# Patient Record
Sex: Female | Born: 1955 | Race: White | Hispanic: No | Marital: Married | Smoking: Former smoker
Health system: Southern US, Community
[De-identification: ages and names within clinical notes are randomized; demographics above are authoritative.]

## PROBLEM LIST (undated history)

## (undated) DIAGNOSIS — K219 Gastro-esophageal reflux disease without esophagitis: Secondary | ICD-10-CM

## (undated) DIAGNOSIS — T82110A Breakdown (mechanical) of cardiac electrode, initial encounter: Secondary | ICD-10-CM

## (undated) DIAGNOSIS — R200 Anesthesia of skin: Secondary | ICD-10-CM

## (undated) DIAGNOSIS — E785 Hyperlipidemia, unspecified: Secondary | ICD-10-CM

## (undated) DIAGNOSIS — R519 Headache, unspecified: Secondary | ICD-10-CM

## (undated) DIAGNOSIS — Z95810 Presence of automatic (implantable) cardiac defibrillator: Secondary | ICD-10-CM

## (undated) DIAGNOSIS — I428 Other cardiomyopathies: Secondary | ICD-10-CM

## (undated) DIAGNOSIS — I442 Atrioventricular block, complete: Secondary | ICD-10-CM

## (undated) DIAGNOSIS — M549 Dorsalgia, unspecified: Secondary | ICD-10-CM

## (undated) DIAGNOSIS — R51 Headache: Secondary | ICD-10-CM

## (undated) DIAGNOSIS — Z860100 Personal history of colon polyps, unspecified: Secondary | ICD-10-CM

## (undated) DIAGNOSIS — F419 Anxiety disorder, unspecified: Secondary | ICD-10-CM

## (undated) DIAGNOSIS — M2550 Pain in unspecified joint: Secondary | ICD-10-CM

## (undated) DIAGNOSIS — I10 Essential (primary) hypertension: Secondary | ICD-10-CM

## (undated) DIAGNOSIS — M2540 Effusion, unspecified joint: Secondary | ICD-10-CM

## (undated) DIAGNOSIS — C569 Malignant neoplasm of unspecified ovary: Secondary | ICD-10-CM

## (undated) DIAGNOSIS — R202 Paresthesia of skin: Secondary | ICD-10-CM

## (undated) DIAGNOSIS — M1990 Unspecified osteoarthritis, unspecified site: Secondary | ICD-10-CM

## (undated) DIAGNOSIS — M109 Gout, unspecified: Secondary | ICD-10-CM

## (undated) DIAGNOSIS — R42 Dizziness and giddiness: Secondary | ICD-10-CM

## (undated) DIAGNOSIS — I3139 Other pericardial effusion (noninflammatory): Secondary | ICD-10-CM

## (undated) DIAGNOSIS — I313 Pericardial effusion (noninflammatory): Secondary | ICD-10-CM

## (undated) DIAGNOSIS — I5022 Chronic systolic (congestive) heart failure: Secondary | ICD-10-CM

## (undated) DIAGNOSIS — Z86718 Personal history of other venous thrombosis and embolism: Secondary | ICD-10-CM

## (undated) DIAGNOSIS — E039 Hypothyroidism, unspecified: Secondary | ICD-10-CM

## (undated) DIAGNOSIS — I739 Peripheral vascular disease, unspecified: Secondary | ICD-10-CM

## (undated) HISTORY — DX: Atrioventricular block, complete: I44.2

## (undated) HISTORY — DX: Gout, unspecified: M10.9

## (undated) HISTORY — DX: Essential (primary) hypertension: I10

## (undated) HISTORY — DX: Breakdown (mechanical) of cardiac electrode, initial encounter: T82.110A

## (undated) HISTORY — PX: KNEE SURGERY: SHX244

## (undated) HISTORY — PX: INSERT / REPLACE / REMOVE PACEMAKER: SUR710

## (undated) HISTORY — DX: Other pericardial effusion (noninflammatory): I31.39

## (undated) HISTORY — PX: DG BARIUM SWALLOW (ARMC HX): HXRAD1448

## (undated) HISTORY — DX: Hyperlipidemia, unspecified: E78.5

## (undated) HISTORY — DX: Chronic systolic (congestive) heart failure: I50.22

## (undated) HISTORY — DX: Peripheral vascular disease, unspecified: I73.9

## (undated) HISTORY — DX: Other cardiomyopathies: I42.8

## (undated) HISTORY — DX: Hypothyroidism, unspecified: E03.9

## (undated) HISTORY — PX: ANGIOPLASTY / STENTING ILIAC: SUR31

## (undated) HISTORY — DX: Pericardial effusion (noninflammatory): I31.3

## (undated) HISTORY — PX: HERNIA MESH REMOVAL: SHX1745

## (undated) HISTORY — PX: DILATION AND CURETTAGE OF UTERUS: SHX78

## (undated) HISTORY — DX: Malignant neoplasm of unspecified ovary: C56.9

## (undated) HISTORY — PX: HERNIA REPAIR: SHX51

## (undated) HISTORY — PX: ABDOMINAL HYSTERECTOMY: SHX81

## (undated) HISTORY — PX: CERVICAL CERCLAGE: SHX1329

## (undated) HISTORY — PX: COLONOSCOPY: SHX174

---

## 1992-04-14 HISTORY — PX: CERVICAL CERCLAGE: SHX1329

## 1997-08-14 ENCOUNTER — Other Ambulatory Visit: Admission: RE | Admit: 1997-08-14 | Discharge: 1997-08-14 | Payer: Self-pay | Admitting: Obstetrics and Gynecology

## 1998-08-23 ENCOUNTER — Other Ambulatory Visit: Admission: RE | Admit: 1998-08-23 | Discharge: 1998-08-23 | Payer: Self-pay | Admitting: Obstetrics and Gynecology

## 1998-10-12 ENCOUNTER — Emergency Department (HOSPITAL_COMMUNITY): Admission: EM | Admit: 1998-10-12 | Discharge: 1998-10-12 | Payer: Self-pay | Admitting: Emergency Medicine

## 1998-10-12 ENCOUNTER — Encounter: Payer: Self-pay | Admitting: Emergency Medicine

## 1999-01-10 ENCOUNTER — Ambulatory Visit (HOSPITAL_COMMUNITY): Admission: RE | Admit: 1999-01-10 | Discharge: 1999-01-10 | Payer: Self-pay | Admitting: Cardiology

## 1999-01-10 ENCOUNTER — Encounter: Payer: Self-pay | Admitting: Cardiology

## 2000-09-01 ENCOUNTER — Other Ambulatory Visit: Admission: RE | Admit: 2000-09-01 | Discharge: 2000-09-01 | Payer: Self-pay | Admitting: Obstetrics and Gynecology

## 2001-11-23 ENCOUNTER — Other Ambulatory Visit: Admission: RE | Admit: 2001-11-23 | Discharge: 2001-11-23 | Payer: Self-pay | Admitting: Obstetrics and Gynecology

## 2004-03-08 ENCOUNTER — Ambulatory Visit: Payer: Self-pay

## 2004-04-18 ENCOUNTER — Ambulatory Visit: Payer: Self-pay

## 2004-05-07 ENCOUNTER — Ambulatory Visit: Payer: Self-pay

## 2004-07-05 ENCOUNTER — Ambulatory Visit: Payer: Self-pay | Admitting: Cardiology

## 2004-08-05 ENCOUNTER — Ambulatory Visit: Payer: Self-pay | Admitting: Cardiology

## 2004-09-13 ENCOUNTER — Ambulatory Visit: Payer: Self-pay | Admitting: Cardiology

## 2004-10-03 ENCOUNTER — Ambulatory Visit: Payer: Self-pay | Admitting: Cardiology

## 2004-11-01 ENCOUNTER — Ambulatory Visit: Payer: Self-pay | Admitting: Cardiology

## 2004-12-06 ENCOUNTER — Ambulatory Visit: Payer: Self-pay | Admitting: Cardiology

## 2005-01-13 ENCOUNTER — Ambulatory Visit: Payer: Self-pay | Admitting: Cardiology

## 2005-02-17 ENCOUNTER — Ambulatory Visit: Payer: Self-pay | Admitting: Cardiology

## 2005-03-17 ENCOUNTER — Ambulatory Visit: Payer: Self-pay | Admitting: Cardiology

## 2005-04-21 ENCOUNTER — Ambulatory Visit: Payer: Self-pay | Admitting: Cardiology

## 2005-06-18 ENCOUNTER — Ambulatory Visit: Payer: Self-pay | Admitting: Cardiology

## 2005-07-28 ENCOUNTER — Ambulatory Visit: Payer: Self-pay | Admitting: Cardiology

## 2005-09-01 ENCOUNTER — Ambulatory Visit: Payer: Self-pay | Admitting: Cardiology

## 2005-10-08 ENCOUNTER — Ambulatory Visit: Payer: Self-pay | Admitting: Cardiology

## 2005-11-06 ENCOUNTER — Ambulatory Visit: Payer: Self-pay | Admitting: Cardiology

## 2005-12-18 ENCOUNTER — Ambulatory Visit: Payer: Self-pay | Admitting: Cardiology

## 2006-01-19 ENCOUNTER — Ambulatory Visit: Payer: Self-pay | Admitting: Cardiology

## 2006-03-16 ENCOUNTER — Ambulatory Visit: Payer: Self-pay | Admitting: Cardiology

## 2006-04-10 ENCOUNTER — Ambulatory Visit: Payer: Self-pay | Admitting: Cardiology

## 2006-05-08 ENCOUNTER — Ambulatory Visit: Payer: Self-pay | Admitting: Cardiology

## 2006-06-05 ENCOUNTER — Ambulatory Visit: Payer: Self-pay | Admitting: Cardiology

## 2006-07-31 ENCOUNTER — Ambulatory Visit: Payer: Self-pay | Admitting: Cardiology

## 2006-08-28 ENCOUNTER — Ambulatory Visit: Payer: Self-pay | Admitting: Cardiology

## 2006-11-09 ENCOUNTER — Ambulatory Visit: Payer: Self-pay | Admitting: Cardiology

## 2006-11-18 ENCOUNTER — Ambulatory Visit: Payer: Self-pay

## 2006-12-18 ENCOUNTER — Ambulatory Visit: Payer: Self-pay | Admitting: Cardiology

## 2007-01-15 ENCOUNTER — Ambulatory Visit: Payer: Self-pay | Admitting: Cardiology

## 2007-02-12 ENCOUNTER — Ambulatory Visit: Payer: Self-pay | Admitting: Cardiology

## 2007-05-26 ENCOUNTER — Encounter: Payer: Self-pay | Admitting: Cardiology

## 2007-05-26 ENCOUNTER — Ambulatory Visit: Payer: Self-pay | Admitting: Cardiology

## 2007-08-06 ENCOUNTER — Ambulatory Visit: Payer: Self-pay | Admitting: Cardiology

## 2007-11-12 ENCOUNTER — Ambulatory Visit: Payer: Self-pay | Admitting: Cardiology

## 2008-06-21 ENCOUNTER — Ambulatory Visit: Admission: RE | Admit: 2008-06-21 | Discharge: 2008-06-21 | Payer: Self-pay | Admitting: Gynecologic Oncology

## 2008-06-23 ENCOUNTER — Ambulatory Visit: Payer: Self-pay | Admitting: Cardiovascular Disease

## 2008-06-23 ENCOUNTER — Encounter: Payer: Self-pay | Admitting: Cardiovascular Disease

## 2008-06-23 DIAGNOSIS — I429 Cardiomyopathy, unspecified: Secondary | ICD-10-CM | POA: Insufficient documentation

## 2008-06-23 DIAGNOSIS — E785 Hyperlipidemia, unspecified: Secondary | ICD-10-CM | POA: Insufficient documentation

## 2008-06-23 DIAGNOSIS — E782 Mixed hyperlipidemia: Secondary | ICD-10-CM | POA: Insufficient documentation

## 2008-06-23 DIAGNOSIS — I442 Atrioventricular block, complete: Secondary | ICD-10-CM | POA: Insufficient documentation

## 2008-07-04 ENCOUNTER — Inpatient Hospital Stay (HOSPITAL_COMMUNITY): Admission: RE | Admit: 2008-07-04 | Discharge: 2008-07-11 | Payer: Self-pay | Admitting: Obstetrics & Gynecology

## 2008-07-04 ENCOUNTER — Encounter: Payer: Self-pay | Admitting: Gynecologic Oncology

## 2008-07-04 ENCOUNTER — Ambulatory Visit: Payer: Self-pay | Admitting: Internal Medicine

## 2008-07-05 ENCOUNTER — Ambulatory Visit: Payer: Self-pay | Admitting: Oncology

## 2008-07-13 HISTORY — PX: TOTAL ABDOMINAL HYSTERECTOMY W/ BILATERAL SALPINGOOPHORECTOMY: SHX83

## 2008-08-05 ENCOUNTER — Encounter (INDEPENDENT_AMBULATORY_CARE_PROVIDER_SITE_OTHER): Payer: Self-pay | Admitting: *Deleted

## 2008-08-09 ENCOUNTER — Encounter: Payer: Self-pay | Admitting: Cardiology

## 2008-08-09 ENCOUNTER — Ambulatory Visit: Admission: RE | Admit: 2008-08-09 | Discharge: 2008-08-09 | Payer: Self-pay | Admitting: Gynecologic Oncology

## 2008-10-25 ENCOUNTER — Ambulatory Visit: Admission: RE | Admit: 2008-10-25 | Discharge: 2008-10-25 | Payer: Self-pay | Admitting: Gynecologic Oncology

## 2008-10-25 ENCOUNTER — Encounter: Payer: Self-pay | Admitting: Cardiology

## 2008-10-30 ENCOUNTER — Ambulatory Visit: Payer: Self-pay | Admitting: Cardiology

## 2008-10-30 ENCOUNTER — Ambulatory Visit: Payer: Self-pay

## 2008-10-30 ENCOUNTER — Encounter: Payer: Self-pay | Admitting: Cardiology

## 2008-10-30 DIAGNOSIS — C569 Malignant neoplasm of unspecified ovary: Secondary | ICD-10-CM | POA: Insufficient documentation

## 2009-02-28 ENCOUNTER — Telehealth: Payer: Self-pay | Admitting: Cardiology

## 2009-05-09 ENCOUNTER — Encounter: Payer: Self-pay | Admitting: Cardiology

## 2009-05-09 ENCOUNTER — Ambulatory Visit: Admission: RE | Admit: 2009-05-09 | Discharge: 2009-05-09 | Payer: Self-pay | Admitting: Gynecologic Oncology

## 2009-05-14 ENCOUNTER — Ambulatory Visit: Payer: Self-pay | Admitting: Internal Medicine

## 2009-07-30 ENCOUNTER — Ambulatory Visit: Payer: Self-pay | Admitting: Gastroenterology

## 2009-10-19 ENCOUNTER — Telehealth: Payer: Self-pay | Admitting: Cardiology

## 2009-11-12 ENCOUNTER — Encounter (INDEPENDENT_AMBULATORY_CARE_PROVIDER_SITE_OTHER): Payer: Self-pay | Admitting: *Deleted

## 2009-11-29 ENCOUNTER — Ambulatory Visit: Payer: Self-pay | Admitting: Cardiology

## 2009-11-29 DIAGNOSIS — Z950 Presence of cardiac pacemaker: Secondary | ICD-10-CM | POA: Insufficient documentation

## 2010-02-08 ENCOUNTER — Encounter (INDEPENDENT_AMBULATORY_CARE_PROVIDER_SITE_OTHER): Payer: Self-pay | Admitting: *Deleted

## 2010-03-15 ENCOUNTER — Ambulatory Visit: Payer: Self-pay | Admitting: Internal Medicine

## 2010-03-18 ENCOUNTER — Ambulatory Visit: Payer: Self-pay | Admitting: Cardiology

## 2010-03-18 ENCOUNTER — Encounter: Payer: Self-pay | Admitting: Cardiology

## 2010-03-18 DIAGNOSIS — I70219 Atherosclerosis of native arteries of extremities with intermittent claudication, unspecified extremity: Secondary | ICD-10-CM | POA: Insufficient documentation

## 2010-03-20 ENCOUNTER — Encounter: Payer: Self-pay | Admitting: Cardiology

## 2010-03-20 ENCOUNTER — Ambulatory Visit: Payer: Self-pay

## 2010-04-03 ENCOUNTER — Ambulatory Visit: Payer: Self-pay | Admitting: Cardiovascular Disease

## 2010-04-04 ENCOUNTER — Ambulatory Visit (HOSPITAL_COMMUNITY)
Admission: RE | Admit: 2010-04-04 | Discharge: 2010-04-04 | Payer: Self-pay | Source: Home / Self Care | Attending: Cardiovascular Disease | Admitting: Cardiovascular Disease

## 2010-04-05 HISTORY — PX: ILIAC ARTERY STENT: SHX1786

## 2010-04-14 HISTORY — PX: PACEMAKER INSERTION: SHX728

## 2010-05-02 ENCOUNTER — Ambulatory Visit: Admit: 2010-05-02 | Payer: Self-pay | Admitting: Cardiovascular Disease

## 2010-05-02 ENCOUNTER — Ambulatory Visit: Admit: 2010-05-02 | Payer: Self-pay

## 2010-05-12 ENCOUNTER — Encounter: Payer: Self-pay | Admitting: Internal Medicine

## 2010-05-16 NOTE — Consult Note (Signed)
Summary: [facility]   Imported By: Roderic Ovens 05/11/2009 13:55:16  _____________________________________________________________________  External Attachment:    Type:   Image     Comment:   External Document

## 2010-05-16 NOTE — Procedures (Signed)
Summary: pacer check/st.jude   Current Medications (verified): 1)  Metoprolol Tartrate 50 Mg Tabs (Metoprolol Tartrate) .... Take One and A Half  Tablets  By Mouth Twice A Day 2)  Benazepril Hcl 10 Mg Tabs (Benazepril Hcl) .... Take 1 By Mouth Once Daily 3)  Aspirin 81 Mg Tbec (Aspirin) .... Take One Tablet By Mouth Daily 4)  Levoxyl 75 Mcg Tabs (Levothyroxine Sodium) 5)  Lovastatin 20 Mg Tabs (Lovastatin) .... Take One Tablet By Mouth Daily At Bedtime  Allergies (verified): 1)  ! * Latex  PPM Specifications Following MD:  Everardo Beals. Juanda Chance, MD     PPM Vendor:  Pacesetter     PPM Model Number:  [id number]     PPM Serial Number:  [id number] DOI:  10/23/1994     PPM Implanting MD:  Everardo Beals. Juanda Chance, MD  Lead 1    Location: [facility]     DOI: 10/23/1994     Model #: [id number]     Serial #: [id number]     Status: active Lead 2    Location: RV     DOI: 10/23/1994     Model #: [id number]     Serial #: [id number]     Status: active   Indications:  Congential Complete heart block   PPM Follow Up Remote Check?  No Battery Voltage:  2.69 V     Battery Est. Longevity:  1 years     Pacer Dependent:  Yes       PPM Device Measurements Atrium  Amplitude: 1.0 mV, Impedance: 564 ohms, Threshold: 2.0 V at 0.6 msec Right Ventricle  Impedance: 830 ohms, Threshold: 1.0 V at 0.4 msec  Parameters Mode:  DDD     Lower Rate Limit:  50     Upper Rate Limit:  120 Paced AV Delay:  125     Next Cardiology Appt Due:  10/12/2009 Tech Comments:  No parameter changes.  Device function normal.  Estimated longevity 1 year.  ROV 6 months Dr. Juanda Chance. [name], LPN  May 14, 2009 9:05 AM

## 2010-05-16 NOTE — Letter (Signed)
Summary: Peripheral Vascular  [facility], Main Office  [address]   [place], [place] [address]   Phone: [phone]  Fax: [phone]     04/03/2010 MRN: [id number]  KORALEE WEDEKING [address] [place], [place]  [address]  Dear [name],   You are scheduled for Peripheral Vascular Angiogram on Thursday April 04, 2010 with Dr. Excell Seltzer.  Please arrive at the [facility] at 7:00       a.m. on the day of your procedure.  1. DIET     __X__ Nothing to eat or drink after midnight except your medications with a sip of water.  2. MAKE SURE YOU TAKE YOUR ASPIRIN.  3. _X___ YOU MAY TAKE ALL of your remaining medications with a small amount of water.  Please take Plavix 75mg  once a day starting 04/04/10.      4. Plan for one night stay - bring personal belongings (i.e. toothpaste, toothbrush, etc.)  5. Bring a current list of your medications and current insurance cards.  6. Must have a responsible person to drive you home.   7. Someone must be with you for the first 24 hours after you arrive home.  8. Please wear clothes that are easy to get on and off and wear slip-on shoes.  *Special note: Every effort is made to have your procedure done on time.  Occasionally there are emergencies that present themselves at the hospital that may cause delays.  Please be patient if a delay does occur.  If you have any questions after you get home, please call the office at the number listed above.   Julieta Gutting, RN, BSN

## 2010-05-16 NOTE — Assessment & Plan Note (Signed)
Summary: npv   Visit Type:  Initial Consult Primary Provider:  Vonita Moss  CC:  leg pain/claudication.  History of Present Illness: 55 year-old woman presenting for initial evaluation of lower extremity PAD. She describes about 6 months of left leg pain in the calf, thigh, and groin. This occurs with walking and resolves with rest. She notes a longer duration of pain with rest compared to the time of pain onset in the Summer. She cannot walk up an incline without pain in the leg. Denies right leg pain. Denies rest pain or ulceration. Notes progression of symptoms over the past few months. She previously was a regular walker - notes 6 pound weight gain since she has had to stop her walking program.  The patient has a history of cardiac conduction disease and is s/p PPM in 1996. She has no history of CAD.  Current Medications (verified): 1)  Metoprolol Tartrate 50 Mg Tabs (Metoprolol Tartrate) .... Take One and A Half  Tablets  By Mouth Twice A Day 2)  Benazepril Hcl 10 Mg Tabs (Benazepril Hcl) .... Take 1 By Mouth Once Daily 3)  Aspirin 81 Mg Tbec (Aspirin) .... Take One Tablet By Mouth Daily 4)  Synthroid 100 Mcg Tabs (Levothyroxine Sodium) 5)  Lovastatin 20 Mg Tabs (Lovastatin) .... Take One Tablet By Mouth Daily At Bedtime 6)  Centrum  Tabs (Multiple Vitamins-Minerals) .... One By Mouth Daily  Allergies (verified): 1)  ! * Latex  Past History:  Past medical, surgical, family and social histories (including risk factors) reviewed, and no changes noted (except as noted below).  Past Medical History: Reviewed history from 10/29/2008 and no changes required. 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 Pacemaker 1996 Knee surgery  Family History: Reviewed  history from 06/23/2008 and no changes required. Her father had diabetes.  He died of lung cancer.   Social History: Reviewed history from 06/23/2008 and no changes required. 1ppd for 34 years - quit March 2010 4-6 beers per night no illicit drugs Married I child  Review of Systems       Positive for numbness/tingling in her hands at night. Otherwise negative except as per HPI.  Vital Signs:  Patient profile:   55 year old female Height:      65 inches Weight:      196 pounds BMI:     32.73 Pulse rate:   72 / minute Resp:     16 per minute BP sitting:   107 / 75  (right arm)  Vitals Entered By: Marrion Coy, CNA (April 03, 2010 9:26 AM)  Serial Vital Signs/Assessments:  Time      Position  BP       Pulse  Resp  Temp     By           L Arm     119/80                         Marrion Coy, CNA   Physical Exam  General:  Pt is alert and oriented, in no acute distress. HEENT: normal Neck: normal carotid upstrokes without bruits, JVP normal Lungs: CTA CV: RRR without murmur or gallop Abd: soft, NT, positive BS, no bruit, no organomegaly Ext: no clubbing, cyanosis, or edema.  Right leg:  fem, DP pulses 2+, PT nonpalp Left leg: fem, DP, and PT nonpalp Skin: warm and dry without rash feet are warm, no ulceration    Arterial Doppler  Procedure date:  03/20/2010  Findings:      ABI 0.78 on the left and 1.0 on the right. Left external iliac peak velocity greater than 600 cm/s, right leg arteries patent.  PPM Specifications Following MD:  Everardo Beals. Juanda Chance, MD     PPM Vendor:  Pacesetter     PPM Model Number:  [phone]     PPM Serial Number:  [id number] DOI:  10/23/1994     PPM Implanting MD:  Everardo Beals. 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 Her exam and noninvasive studies are consistent with severe iliac stenosis. The duplex scan shows the stenosis at the junction of the common and external iliac with associated post-stenotic dilatation. Recommended angiography with an eye toward PTA and the patient agrees to proceed. Risks, potential benefits, and alternatives were reviewed with the patient and she expressed understanding. She will be started on plavix 300 mg today and then 75 mg daily starting tomorrow.  Orders: PV Procedure (PV Procedure) Arterial Duplex Lower Extremity (Arterial Duplex Low)  Patient Instructions: 1)  Your physician recommends that you schedule a follow-up appointment in: 2-4 WEEKS 2)  Your physician has requested that you have an ankle brachial index (ABI) in 2-4 WEEKS. During this test an ultrasound and blood pressure cuff are used to evaluate the arteries that supply the arms and legs with blood. Allow thirty minutes for this exam. There are no restrictions or special instructions. 3)  Your physician has requested that you have a peripheral vascular angiogram. This exam is performed at the hospital. During this exam IV contrast is used to look at arterial blood flow.  Please review the information sheet given for details. 4)  Your physician has recommended you make the following change in your medication: START Plavix 75mg  once a day Prescriptions: PLAVIX 75 MG TABS (CLOPIDOGREL BISULFATE) Take one tablet by mouth daily  #30 x 1   Entered by:   Julieta Gutting, RN, BSN   Authorized by:   Norva Karvonen, MD   Signed by:   Julieta Gutting, RN, BSN on 04/03/2010   Method used:    Electronically to        [id number] (retail)       [address]       [place], [place]  [address]       Ph: [phone] or [phone]  Fax: [phone]   RxID:   [id number]

## 2010-05-16 NOTE — Miscellaneous (Signed)
Summary: dx correction  Clinical Lists Changes  Problems: Changed problem from PACEMAKER (ICD-V45..01) to PACEMAKER, PERMANENT (ICD-V45.01)  changed the incorrect dx code to correct dx code 

## 2010-05-16 NOTE — Letter (Signed)
Summary: Device-Delinquent Check  [facility], Main Office  [address]   [place], [place] [address]   Phone: [phone]  Fax: [phone]     November 12, 2009 MRN: [id number]   [name] [name] [address] [place], [place]  [address]   Dear [name],  According to our records, you have not had your implanted device checked in the recommended period of time.  We are unable to determine appropriate device function without checking your device on a regular basis.  Please call our office to schedule an appointment as soon as possible.  If you are having your device checked by another physician, please call us so that we may update our records.  Thank you,   [facility]

## 2010-05-16 NOTE — Procedures (Signed)
Summary: device/saf   Current Medications (verified): 1)  Metoprolol Tartrate 50 Mg Tabs (Metoprolol Tartrate) .... Take One and A Half  Tablets  By Mouth Twice A Day 2)  Benazepril Hcl 10 Mg Tabs (Benazepril Hcl) .... Take 1 By Mouth Once Daily 3)  Aspirin 81 Mg Tbec (Aspirin) .... Take One Tablet By Mouth Daily 4)  Synthroid 100 Mcg Tabs (Levothyroxine Sodium) 5)  Lovastatin 20 Mg Tabs (Lovastatin) .... Take One Tablet By Mouth Daily At Bedtime 6)  Centrum  Tabs (Multiple Vitamins-Minerals) .... One By Mouth Daily  Allergies (verified): 1)  ! * Latex   PPM Specifications Following MD:  [name] Beals. Juanda Chance, MD     PPM Vendor:  Pacesetter     PPM Model Number:  [id number]     PPM Serial Number:  [id number] PPM DOI:  10/23/1994     PPM Implanting MD:  [name] Beals. Juanda Chance, MD  Lead 1    Location: [facility]     DOI: 10/23/1994     Model #: [id number]     Serial #: [id number]     Status: active Lead 2    Location: RV     DOI: 10/23/1994     Model #: 1246T     Serial #: [id number]     Status: active   Indications:  Congential Complete heart block   PPM Follow Up Remote Check?  No Battery Voltage:  2.54 V     Battery Est. Longevity:  4 months     Pacer Dependent:  Yes       PPM Device Measurements Atrium  Impedance: 569 ohms,  Right Ventricle  Impedance: 798 ohms,   Episodes Coumadin:  No  Parameters Mode:  DDD     Lower Rate Limit:  50     Upper Rate Limit:  120 Paced AV Delay:  125     Tech Comments:  Battery check only today, estimated longevity 4 months.  [name] [name] is c/o calf pain when she walks that subsides with rest.  She will see Dr. Juanda [name] 12/5 to evaluate. [name] Harm, LPN  March 15, 2010 3:41 PM

## 2010-05-16 NOTE — Progress Notes (Signed)
Summary: does she need any procedure at next visit  Phone Note Call from Patient   Caller: Patient Reason for Call: Talk to Nurse Summary of Call: pt wants to know if she needs any procedure done when she comes in for her appt-also wants to know why she's to see dr taylor and dr Sheron Tallman-pls call [phone] ext [phone] Initial call taken by: Glynda Jaeger,  October 19, 2009 1:33 PM  Follow-up for Phone Call        I spoke with the pt and scheduled her to see Dr Juanda Chance in August.  The pt will have to pay out of pocket for this visit and was wondering what will be done at her appt.  I told the pt to the best of my knowledge she would be due for an EKG and device check at [facility].  (The pt had a reminder in the system for EP in [place], this was cancelled) Follow-up by: Julieta Gutting, RN, BSN,  October 19, 2009 2:04 PM

## 2010-05-16 NOTE — Assessment & Plan Note (Signed)
Summary: rov   Visit Type:  routine Primary Provider:  Vonita Moss  CC:  none.  History of Present Illness: The patient is 55 years old and return for management of her pacemaker and cardiomyopathy. She had a Paragon II DDD pacemaker implanted for AV block in 1996. She is pacer dependent.  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 Her ejection fraction was 25-30% and improved to 45%. Her last echo was in July of 2010 at which time ejection fraction was 40-45%.  She has done quite well over the past year with no chest pain shortness breath or palpitations.  History is also significant for ovarian cancer operated on in April 2010.  Current Medications (verified): 1)  Metoprolol Tartrate 50 Mg Tabs (Metoprolol Tartrate) .... Take One and A Half  Tablets  By Mouth Twice A Day 2)  Benazepril Hcl 10 Mg Tabs (Benazepril Hcl) .... Take 1 By Mouth Once Daily 3)  Aspirin 81 Mg Tbec (Aspirin) .... Take One Tablet By Mouth Daily 4)  Synthroid 100 Mcg Tabs (Levothyroxine Sodium) 5)  Lovastatin 20 Mg Tabs (Lovastatin) .... Take One Tablet By Mouth Daily At Bedtime 6)  Chlorophyll 3-0.6 Mg Tabs (Chlorophyll-Thymol) .... One Tablet By Mouth Daily (Helps To Burn Belly Fat)  Allergies (verified): 1)  ! * Latex  Past History:  Past Medical History: Reviewed history from 10/29/2008 and no changes required. 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   Vital Signs:  Patient profile:   55 year old female Height:      68 inches Weight:      191 pounds BMI:     29.15 Pulse rate:   65 / minute BP sitting:   105 / 74  (left arm)  Vitals Entered By: Burnett Kanaris, CNA (November 29, 2009 8:40 AM)  Physical Exam  Additional Exam:  Gen. Well-nourished, in no  distress   Neck: No JVD, thyroid not enlarged, no carotid bruits Lungs: No tachypnea, clear without rales, rhonchi or wheezes Cardiovascular: Rhythm regular, PMI not displaced,  heart sounds  normal, no murmurs or gallops, no peripheral edema, pulses normal in all 4 extremities. Abdomen: BS normal, abdomen soft and non-tender without masses or organomegaly, no hepatosplenomegaly. MS: No deformities, no cyanosis or clubbing   Neuro:  No focal sns   Skin:  no lesions    PPM Specifications Following MD:  Everardo Beals. Juanda Chance, MD     PPM Vendor:  Pacesetter     PPM Model Number:  [id number]     PPM Serial Number:  [id number] DOI:  10/23/1994     PPM Implanting MD:  Everardo Beals. Juanda Chance, MD  Lead 1    Location: RA     DOI: 10/23/1994     Model #: 1188T     Serial #: [id number]     Status: active Lead 2    Location: RV     DOI: 10/23/1994     Model #: 1246T     Serial #: [id number]     Status: active   Indications:  Congential Complete heart block   PPM Follow Up Remote Check?  No Battery Voltage:  2.59 V     Battery Est. Longevity:  6 months     Pacer Dependent:  Yes       PPM Device Measurements Atrium  Amplitude: 1.5 mV, Impedance: 609 ohms, Threshold: 2.0 V at 0.6 msec Right Ventricle  Impedance: 897 ohms, Threshold: 1.0 V at 0.4 msec  Parameters Mode:  DDD     Lower Rate Limit:  50     Upper Rate Limit:  120 Paced AV Delay:  125     Next Cardiology Appt Due:  03/14/2010 Tech Comments:  No parameter changes.  Device function normal.  ROV 4 months in [place] since she is near ERI. [name], LPN  November 29, 2009 9:44 AM   Impression & Recommendations:  Problem # 1:  PACEMAKER (ICD-V45.Marland Kitchen01) She has a DDD pacemaker that has about 6 months of battery life left. She is pacer dependent. Her lead parameters are good. We will plan to recheck her pacemaker in [place] in 4 months. She would like to get her long-term followup in [place] so we will arrange her to see one of our EP doctors there in  March of 2012.  Problem # 2:  CARDIOMYOPATHY, SECONDARY (ICD-425.9) She had what we thought was a rate related heart myopathy. Her last ejection fraction was 40-45%. She is euvolemic and stable. We'll plan a repeat echocardiogram in about one year. Her updated medication list for this problem includes:    Metoprolol Tartrate 50 Mg Tabs (Metoprolol tartrate) .Marland Kitchen... Take one and a half  tablets  by mouth twice a day    Benazepril Hcl 10 Mg Tabs (Benazepril hcl) .Marland Kitchen... Take 1 by mouth once daily    Aspirin 81 Mg Tbec (Aspirin) .Marland Kitchen... Take one tablet by mouth daily  Orders: EKG w/ Interpretation (93000)  Patient Instructions: 1)  Your physician recommends that you schedule a follow-up appointment in: 4 months in [place] with [place] for a device check. 2)  Your physician wants you to follow-up in: March 2012 with Dr. Graciela Husbands in [place].  You will receive a reminder letter in the mail two months in advance. If you don't receive a letter, please call our office to schedule the follow-up appointment. 3)  Your physician recommends that you continue on your current medications as directed. Please refer to the Current Medication list given to you today.

## 2010-05-16 NOTE — Assessment & Plan Note (Signed)
Summary: rov   Visit Type:  Follow-up Primary Provider:  Vonita Moss  CC:  ROV; Chest tightness; Pain in (L) leg.  History of Present Illness: The patient is 55 years old and returned with left leg pain worse with waling up hill.  Seen by Gunnar Fusi in pacer clinic and she arranged f/u.  She had a Paragon II DDD pacemaker implanted for AV block in 1996. She is pacer dependent.  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 Her ejection fraction was 25-30% and improved to 45%. Her last echo was in July of 2010 at which time ejection fraction was 40-45%.  She has done quite well over the past year with no chest pain shortness breath or palpitations.  History is also significant for ovarian cancer operated on in April 2010.  Current Medications (verified): 1)  Metoprolol Tartrate 50 Mg Tabs (Metoprolol Tartrate) .... Take One and A Half  Tablets  By Mouth Twice A Day 2)  Benazepril Hcl 10 Mg Tabs (Benazepril Hcl) .... Take 1 By Mouth Once Daily 3)  Aspirin 81 Mg Tbec (Aspirin) .... Take One Tablet By Mouth Daily 4)  Synthroid 100 Mcg Tabs (Levothyroxine Sodium) 5)  Lovastatin 20 Mg Tabs (Lovastatin) .... Take One Tablet By Mouth Daily At Bedtime 6)  Chlorophyll 3-0.6 Mg Tabs (Chlorophyll-Thymol) .... One Tablet By Mouth Daily (Helps To Burn Belly Fat)  Allergies: 1)  ! * Latex  Past History:  Past Medical History: Reviewed history from 10/29/2008 and no changes required. 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   Vital Signs:  Patient profile:   55 year old female Height:      65 inches Weight:      195 pounds BMI:     32.57 Pulse rate:   68 / minute BP sitting:   112 / 70  (left arm) Cuff size:   regular  Vitals Entered By: Stanton Kidney,  EMT-P (March 18, 2010 1:59 PM)  Physical Exam  Additional Exam:  Gen. Well-nourished, in no distress   Neck: No JVD, thyroid not enlarged, no carotid bruits Lungs: No tachypnea, clear without rales, rhonchi or wheezes Cardiovascular: Rhythm regular, PMI not displaced,  heart sounds  normal, no murmurs or gallops, no peripheral edema, decreased to absent pulses in left foot. Abdomen: BS normal, abdomen soft and non-tender without masses or organomegaly, no hepatosplenomegaly. MS: No deformities, no cyanosis or clubbing   Neuro:  No focal sns   Skin:  no lesions    PPM Specifications Following MD:  Everardo Beals. Juanda Chance, MD     PPM Vendor:  Pacesetter     PPM Model Number:  [id number]     PPM Serial Number:  [id number] PPM DOI:  10/23/1994     PPM Implanting MD:  Everardo Beals. 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  Suspect PVD.  Will get arterial dopplers.  Orders: Arterial Duplex Lower Extremity (Arterial Duplex Low)  Problem # 2:  PACEMAKER, PERMANENT (ICD-V45.01) Near ERI.  Pacer dependent.  Following closely.  Problem # 3:  CARDIOMYOPATHY, SECONDARY (ICD-425.9)  She has had rate related CM due to ST.  Last EF was 40-45%.  Stable. Her updated medication list for this problem includes:    Metoprolol Tartrate 50 Mg Tabs (Metoprolol tartrate) .Marland Kitchen... Take one and a half  tablets  by mouth twice a day    Benazepril Hcl 10 Mg Tabs (Benazepril hcl)  .Marland Kitchen... Take 1 by mouth once daily    Aspirin 81 Mg Tbec (Aspirin) .Marland Kitchen... Take one tablet by mouth daily  Orders: EKG w/ Interpretation (93000)  Patient Instructions: 1)  Your physician has requested that you have a lower extremity arterial duplex- we will scheduled this for Wed. 03/20/10 @ 9:30am.  This test is an ultrasound of the arteries in the legs or arms.  It looks at arterial blood flow in the legs and arms.  Allow one hour for Lower and Upper Arterial scans. There are no restrictions or special instructions. 2)  You will need to see Gunnar Fusi in the device clinic in [place] in 2 months.

## 2010-05-22 NOTE — Miscellaneous (Signed)
Summary: corrected device information  Clinical Lists Changes  Observations: Added new observation of MAGNET RTE: BOL    MR=PR ERI  100Msec> (05/12/2010 14:27) Added new observation of PPM MODL#: 2016L (05/12/2010 14:27)      PPM Specifications Following MD:  Everardo Beals. Juanda Chance, MD     PPM Vendor:  Pacesetter     PPM Model Number:  [phone]     PPM Serial Number:  [id number] DOI:  10/23/1994     PPM Implanting MD:  Everardo Beals. Juanda Chance, MD  Lead 1    Location: RA     DOI: 10/23/1994     Model #: 1188T     Serial #: [id number]     Status: active Lead 2    Location: RV     DOI: 10/23/1994     Model #: 1246T     Serial #: [id number]     Status: active  Magnet Response Rate:  BOL    MR=PR ERI  100Msec>  Indications:  Congential Complete heart block   PPM Follow Up Pacer Dependent:  Yes      Episodes Coumadin:  No  Parameters Mode:  DDD     Lower Rate Limit:  50     Upper Rate Limit:  120 Paced AV Delay:  125

## 2010-05-23 ENCOUNTER — Encounter (INDEPENDENT_AMBULATORY_CARE_PROVIDER_SITE_OTHER): Payer: BC Managed Care – PPO

## 2010-05-23 ENCOUNTER — Encounter: Payer: Self-pay | Admitting: Internal Medicine

## 2010-05-23 DIAGNOSIS — R0989 Other specified symptoms and signs involving the circulatory and respiratory systems: Secondary | ICD-10-CM

## 2010-06-05 NOTE — Procedures (Signed)
Summary: DEVICE/SAF/NR   Current Medications (verified): 1)  Metoprolol Tartrate 50 Mg Tabs (Metoprolol Tartrate) .... Take One and A Half  Tablets  By Mouth Twice A Day 2)  Benazepril Hcl 10 Mg Tabs (Benazepril Hcl) .... Take 1 By Mouth Once Daily 3)  Aspirin 81 Mg Tbec (Aspirin) .... Take One Tablet By Mouth Daily 4)  Synthroid 100 Mcg Tabs (Levothyroxine Sodium) 5)  Lovastatin 20 Mg Tabs (Lovastatin) .... Take One Tablet By Mouth Daily At Bedtime 6)  Centrum  Tabs (Multiple Vitamins-Minerals) .... One By Mouth Daily 7)  Tums Ultra 1000 1000 Mg Chew (Calcium Carbonate Antacid) .Marland Kitchen.. 1-2 As Needed 8)  Clonazepam 0.5 Mg Tabs (Clonazepam) .... As Needed  Allergies (verified): 1)  ! * Latex   PPM Specifications Following MD:  Lewayne Bunting, MD     PPM Vendor:  Pacesetter     PPM Model Number:  [id number]     PPM Serial Number:  [id number] PPM DOI:  10/23/1994     PPM Implanting MD:  Everardo Beals. Juanda Chance, MD  Lead 1    Location: RA     DOI: 10/23/1994     Model #: [id number]     Serial #: [id number]     Status: active Lead 2    Location: RV     DOI: 10/23/1994     Model #: [id number]     Serial #: [id number]     Status: active  Magnet Response Rate:  BOL    MR=PR ERI  100Msec>  Indications:  Congential Complete heart block   PPM Follow Up Remote Check?  No Battery Voltage:  2.51 V     Battery Est. Longevity:  3 months     Pacer Dependent:  Yes      Episodes Coumadin:  No  Parameters Mode:  DDD     Lower Rate Limit:  50     Upper Rate Limit:  120 Paced AV Delay:  125     Tech Comments:  Battery check only today.  ROV 3 months [facility] [place], [place]  May 23, 2010 8:59 AM

## 2010-06-14 ENCOUNTER — Encounter: Payer: Self-pay | Admitting: Internal Medicine

## 2010-06-14 ENCOUNTER — Encounter (INDEPENDENT_AMBULATORY_CARE_PROVIDER_SITE_OTHER): Payer: BC Managed Care – PPO

## 2010-06-14 DIAGNOSIS — R0989 Other specified symptoms and signs involving the circulatory and respiratory systems: Secondary | ICD-10-CM

## 2010-06-14 DIAGNOSIS — I442 Atrioventricular block, complete: Secondary | ICD-10-CM

## 2010-06-22 ENCOUNTER — Encounter: Payer: Self-pay | Admitting: Internal Medicine

## 2010-06-24 LAB — CBC
HCT: 40.9 % (ref 36.0–46.0)
MCH: 31.7 pg (ref 26.0–34.0)
MCHC: 34.2 g/dL (ref 30.0–36.0)
MCV: 92.5 fL (ref 78.0–100.0)
Platelets: 192 10*3/uL (ref 150–400)
RDW: 13.1 % (ref 11.5–15.5)
WBC: 6.6 10*3/uL (ref 4.0–10.5)

## 2010-06-24 LAB — BASIC METABOLIC PANEL
BUN: 15 mg/dL (ref 6–23)
Chloride: 106 mEq/L (ref 96–112)
Creatinine, Ser: 0.88 mg/dL (ref 0.4–1.2)
Glucose, Bld: 108 mg/dL — ABNORMAL HIGH (ref 70–99)

## 2010-06-25 NOTE — Procedures (Signed)
Summary: no charge per Paula/fast pacer battery check/amd   Allergies: 1)  ! * Latex   PPM Specifications Following MD:  Lewayne Bunting, MD     PPM Vendor:  [employer]acesetter     PPM Model Number:  [phone]     PPM Serial Number:  [id number] PPM DOI:  10/23/1994     PPM Implanting MD:  Everardo Beals. Juanda Chance, MD  Lead 1    Location: RA     DOI: 10/23/1994     Model #: [id number]     Serial #: [id number]     Status: active Lead 2    Location: RV     DOI: 10/23/1994     Model #: [id number]     Serial #: [id number]     Status: active  Magnet Response Rate:  BOL    MR=PR ERI  100Msec>  Indications:  Congential Complete heart block   PPM Follow Up Pacer Dependent:  Yes      Episodes Coumadin:  No  Parameters Mode:  DDD     Lower Rate Limit:  50     Upper Rate Limit:  120 Paced AV Delay:  125     Tech Comments:  See Pace Art

## 2010-06-25 NOTE — Cardiovascular Report (Signed)
Summary: Office Visit   Office Visit   Imported By: Roderic Ovens 06/21/2010 14:54:59  _____________________________________________________________________  External Attachment:    Type:   Image     Comment:   External Document

## 2010-06-30 LAB — CA 125: CA 125: 9.3 U/mL (ref 0.0–30.2)

## 2010-07-25 LAB — DIFFERENTIAL
Eosinophils Absolute: 0.2 10*3/uL (ref 0.0–0.7)
Lymphs Abs: 1.8 10*3/uL (ref 0.7–4.0)
Monocytes Relative: 7 % (ref 3–12)
Neutro Abs: 7.3 10*3/uL (ref 1.7–7.7)
Neutrophils Relative %: 73 % (ref 43–77)

## 2010-07-25 LAB — BASIC METABOLIC PANEL
BUN: 5 mg/dL — ABNORMAL LOW (ref 6–23)
CO2: 23 mEq/L (ref 19–32)
CO2: 23 mEq/L (ref 19–32)
CO2: 26 mEq/L (ref 19–32)
Calcium: 8.3 mg/dL — ABNORMAL LOW (ref 8.4–10.5)
Calcium: 8.6 mg/dL (ref 8.4–10.5)
Chloride: 102 mEq/L (ref 96–112)
Chloride: 105 mEq/L (ref 96–112)
Chloride: 95 mEq/L — ABNORMAL LOW (ref 96–112)
Creatinine, Ser: 0.74 mg/dL (ref 0.4–1.2)
Creatinine, Ser: 0.9 mg/dL (ref 0.4–1.2)
GFR calc Af Amer: >60 mL/min (ref >60)
GFR calc Af Amer: >60 mL/min (ref >60)
GFR calc Af Amer: >60 mL/min (ref >60)
GFR calc Af Amer: >60 mL/min (ref >60)
GFR calc non Af Amer: >60 mL/min (ref >60)
GFR calc non Af Amer: >60 mL/min (ref >60)
GFR calc non Af Amer: >60 mL/min (ref >60)
Glucose, Bld: 107 mg/dL — ABNORMAL HIGH (ref 70–99)
Glucose, Bld: 148 mg/dL — ABNORMAL HIGH (ref 70–99)
Glucose, Bld: 81 mg/dL (ref 70–99)
Potassium: 3.4 mEq/L — ABNORMAL LOW (ref 3.5–5.1)
Potassium: 3.5 mEq/L (ref 3.5–5.1)
Potassium: 4 mEq/L (ref 3.5–5.1)
Sodium: 129 mEq/L — ABNORMAL LOW (ref 135–145)
Sodium: 131 mEq/L — ABNORMAL LOW (ref 135–145)
Sodium: 136 mEq/L (ref 135–145)

## 2010-07-25 LAB — CBC
HCT: 37.7 % (ref 36.0–46.0)
HCT: 47 % — ABNORMAL HIGH (ref 36.0–46.0)
Hemoglobin: 12.8 g/dL (ref 12.0–15.0)
Hemoglobin: 15.9 g/dL — ABNORMAL HIGH (ref 12.0–15.0)
MCHC: 33.8 g/dL (ref 30.0–36.0)
MCHC: 33.8 g/dL (ref 30.0–36.0)
MCHC: 34 g/dL (ref 30.0–36.0)
MCV: 92.4 fL (ref 78.0–100.0)
MCV: 93.8 fL (ref 78.0–100.0)
Platelets: 282 10*3/uL (ref 150–400)
RBC: 3.45 MIL/uL — ABNORMAL LOW (ref 3.87–5.11)
RBC: 4.05 MIL/uL (ref 3.87–5.11)
RBC: 5.01 MIL/uL (ref 3.87–5.11)
RDW: 13 % (ref 11.5–15.5)
RDW: 13.1 % (ref 11.5–15.5)
WBC: 11.1 10*3/uL — ABNORMAL HIGH (ref 4.0–10.5)

## 2010-07-25 LAB — COMPREHENSIVE METABOLIC PANEL
ALT: 11 U/L (ref 0–35)
BUN: 7 mg/dL (ref 6–23)
CO2: 26 mEq/L (ref 19–32)
Calcium: 9.6 mg/dL (ref 8.4–10.5)
Creatinine, Ser: 0.78 mg/dL (ref 0.4–1.2)
GFR calc non Af Amer: >60 mL/min (ref >60)
Glucose, Bld: 103 mg/dL — ABNORMAL HIGH (ref 70–99)
Total Protein: 7.4 g/dL (ref 6.0–8.3)

## 2010-07-25 LAB — TYPE AND SCREEN
ABO/RH(D): O POS
Antibody Screen: NEGATIVE

## 2010-08-12 ENCOUNTER — Ambulatory Visit (INDEPENDENT_AMBULATORY_CARE_PROVIDER_SITE_OTHER): Payer: BC Managed Care – PPO | Admitting: *Deleted

## 2010-08-12 DIAGNOSIS — I442 Atrioventricular block, complete: Secondary | ICD-10-CM

## 2010-08-12 DIAGNOSIS — R0989 Other specified symptoms and signs involving the circulatory and respiratory systems: Secondary | ICD-10-CM

## 2010-08-27 NOTE — Assessment & Plan Note (Signed)
[facility]                            CARDIOLOGY OFFICE NOTE   NAME:[name], [name] CONDON                        MRN:          [id number]  DATE:11/12/2007                            DOB:          [birth year]    PRIMARY CARE PHYSICIAN:  Dr. Vonita Moss.   CLINICAL HISTORY:  [name] [name] is a 55 year old and returned for a  followup management of her pacemaker and cardiomyopathy.  She had a  Paragon II pacemaker implanted in 1996 for a complete AV block.  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  She says she has been feeling well and has had no  chest pain, shortness breath, or palpitations.   PAST MEDICAL HISTORY:  Significant for hyperlipidemia, previous  cigarette use, and hypothyroidism.   CURRENT MEDICATIONS:  1. Fish oil.  2. Lovastatin.  3. Levoxyl.  4. Aspirin.  5. Nexium.  6. Benazepril.  7. Metoprolol.   PHYSICAL EXAMINATION:  VITAL SIGNS:  Today, blood pressure is 107/77 and  pulse 65 and regular.  NECK:  There is no venous distension.  Carotid pulses were full without  bruits.  CHEST:  Clear.  HEART:  Cardiac rhythm is regular.  No murmurs or gallops.  ABDOMEN:  Soft, normal bowel sounds.  EXTREMITIES:  Peripheral pulses are full with no peripheral edema.   We interrogated pacemaker and she was pacer dependent.  She is sensing  in the atrium most all the time and pacing in the ventricle.  Surprisingly, her battery voltage was 2.77 and battery impedance was  only 1 and the estimated longevity was 2.8 years.   IMPRESSION:  1. Status post Paragon II dual-mode, dual-pacing, dual-sensing      pacemaker implantation in 1996 for complete atrioventricular block,      now stable with good pacer function - pacer dependent.  2. History of  tachycardia-induced cardiomyopathy, now improved.  3. History of mural thrombus, now resolved.  4. History of cigarette abuse.  5. Hyperlipidemia.   RECOMMENDATIONS:  I think Mr. Blakley is doing well.  We will plan to  continue follow on Teletrace text.  We will have her come back in a  year, we will do an echocardiogram on the day of that visit.     Bruce Elvera Lennox Juanda Chance, MD, [facility]  Electronically Signed    BRB/MedQ  DD: 11/12/2007  DT: 11/13/2007  Job #: [id number]

## 2010-08-27 NOTE — Consult Note (Signed)
NAME:  [name] [name], [name] [name] NO.:  [id number]   MEDICAL RECORD NO.:  [id number]          PATIENT TYPE:  OUT   LOCATION:  GYN                          FACILITY:  [facility]   PHYSICIAN:  Paola A. Duard Brady, MD    DATE OF BIRTH:  October 21, 1955   DATE OF CONSULTATION:  08/09/2008  DATE OF DISCHARGE:                                 CONSULTATION   HISTORY:  The patient is a very pleasant 55 year old who was initially  referred to [place] for a large abdominal pelvic mass in the right adnexa  with a normal CA-125.  At the time we saw her, the mass measured 11.9 x  9.4 x 10.2 cm that was complex.  She underwent preoperative imaging that  was essentially unremarkable and her CA-125 was slightly elevated at 46.  Subsequently on July 04, 2008, she underwent exploratory laparotomy,  TAH-BSO, right-sided pelvic empiric lymph node dissection, appendectomy,  staging biopsies, omentectomy and diaphragm Pap smear.  Operative  findings included a 20 cm right ovarian mass.  Frozen section was  consistent with a mucinous adenocarcinoma.  There is no evidence of any  extra abdominal disease.  She did well from a postoperative standpoint  and comes in today for pathology review and postoperative check.  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  All peritoneal washings were negative.  Diaphragm and peritoneal biopsies were negative.  The right ovary showed  that there was a 15.5 cm grade II mucinous adenocarcinoma that was  confined with the ovarian capsule.  There was no lymphovascular space  involvement.  The fallopian tube was negative.  The surface was  negative.  The cervix, endometrium, left tube and ovary were  unremarkable, 0/11 lymph nodes were involved and all biopsies and  omentum were negative.  She states she is overall doing quite well  postoperatively.  She has quit smoking.  She is having a lot of  difficulty sleeping as she  is waking up a lot at night to go to the  bathroom.  She is also feeling a little bit more anxious.  She has some  clonazepam that she received from her primary physician as her nerves  are pretty bad.  She does not feel at this point that she is at all able  to return to work as she is not sleeping well and is feeling quite  anxious.  She states her pain is doing better.  She is eating well.  She  has had normal return of bowel and bladder function, and really denies  any significant pain.   PHYSICAL EXAMINATION:  VITAL SIGNS:  Weight 177 pounds which is down 16  pounds from a preop visit, blood pressure 110/70.  GENERAL:  Well-nourished, well-developed female in no acute distress.  ABDOMEN:  Shows well-healed vertical midline incision.  Abdomen is soft  and appropriately tender.  There is no obvious masses or nodularity.  PELVIC:  External genitalia is within normal limits.  Vagina is slightly  atrophic.  The vaginal cuff is  visualized.  It is healing well.  Bimanual examination reveals no masses or nodularity.   ASSESSMENT:  5. 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  Based on her      staging grade, she does not require any adjuvant therapy, but does      require close followup.  Her CA-125 was somewhat elevated at the      time of surgery and that can serve as a tumor marker.  I discussed      with that the appendix was negative.  However because it was a      mucinous carcinoma, we need to rule out other sites of metastatic      disease and she needs a colonoscopy.  She states that she will be      discussing this with her primary physician and will get one      scheduled.  She understands the importance of this and I      encouraged her to get this done within the next month.  2. She has quit smoking since her surgery.  She was congratulated upon      that.  3. She will return to see me in 3 months  for exam and CA-125.      Paola A. Duard Brady, MD  Electronically Signed     PAG/MEDQ  D:  08/09/2008  T:  08/09/2008  Job:  [id number]   cc:   Telford Nab, R.N.  [address]  [place], [place] [address]   Eliberto Ivory. Rosalio Macadamia, M.D.  Fax: [phone]   Everardo Beals. Juanda Chance, MD, [facility]  [address]  [place], [place] [address]   Chaney Born, MD

## 2010-08-27 NOTE — Assessment & Plan Note (Signed)
[facility]                            CARDIOLOGY OFFICE NOTE   NAME:Renfrew, YAMILETH HAYSE                        MRN:          [id number]  DATE:11/09/2006                            DOB:          March 16, 1956    PRIMARY CARE PHYSICIAN:  Dr. Vonita Moss, near [place].   CLINICAL HISTORY:  Ms. Basques is 55 years old and had a Paragon II  pacemaker implanted in 1996 for a complete AV block.  She subsequently  developed a tachycardia induced cardiomyopathy with ejection fraction of  35% - 40% related to sinus tachycardia which improved with increasing  her beta block dosage.  Her electrocardiogram in 2005 showed borderline  normal LV function.  She says she has done quite well and has had no  recent chest pain, shortness of breath or palpitations.   PAST MEDICAL HISTORY:  1. Hyperlipidemia.  2. Previous cigarette use.  3. Hypothyroidism.   CURRENT MEDICATIONS:  Lovastatin, Levoxyl, aspirin, Nexium, Benazepril  and metoprolol.   EXAMINATION:  The blood pressure was 120/80.  There was no venous  distention.  The carotid pulses were full without bruits.  CHEST:  Clear.  CARDIAC:  Rhythm was regular.  I could hear no murmurs or gallops.  ABDOMEN:  Soft without organomegaly.  Peripheral pulses were full, there was no peripheral edema.   Her electrocardiogram without the magnet showed atrial tracking and  ventricular pacing.  She had good thresholds on both channels.  She was  pacer dependent.  Her battery impedance was less than 1 kilohms.   IMPRESSION:  1. Status post Paragon II DDD pacemaker implantation in 1996 for a      complete AV block, now with good pacer function -- pacer dependent.  2. History of tachycardia induced cardiomyopathy, now resolved.  3. History of mural thrombus, now resolved.  4. Cigarette use.  5. Hyperlipidemia.   RECOMMENDATIONS:  I think Ms. Carmean is doing well.  She has not had an  echo in 3 years so we will re-evaluate  her LV function with an  echocardiogram.  I will plan to see her back in a year and we will put  her on Teletrace checks inbetween.     Bruce Elvera Lennox Juanda Chance, MD, [facility]  Electronically Signed    BRB/MedQ  DD: 11/09/2006  DT: 11/10/2006  Job #: [id number]

## 2010-08-27 NOTE — Procedures (Signed)
NAME:  [name] [name], [name] [name] NO.:  [id number]   MEDICAL RECORD NO.:  [id number]          PATIENT TYPE:  OIB   LOCATION:  2899                         FACILITY:  [facility]   PHYSICIAN:  Veverly Fells. Excell Seltzer, MD  DATE OF BIRTH:  08-Aug-1955   DATE OF PROCEDURE:  DATE OF DISCHARGE:                    PERIPHERAL VASCULAR INVASIVE PROCEDURE    PROCEDURES PERFORMED:  1. Ultrasound-guided left femoral artery access  2. Abdominal aortography with runoff.  3. Left common iliac stenting  4. Perclose of left femoral artery.   PROCEDURAL INDICATION:  Ms. [name] [name] is a 55 year old woman who has  developed severe intermittent claudication.  Her noninvasive vascular  duplex scan demonstrated severe distal common iliac stenosis with a peak  velocity over 6 meters per second.  I evaluated her yesterday in the  office and referred her for angiography with an eye toward PTA.   Risks and indications of the procedure were reviewed in depth with the  patient.  Informed consent was obtained.  The left groin was prepped,  draped, and anesthetized with 1% lidocaine.  Using the modified  Seldinger technique under guidance of ultrasound, the left common  femoral artery was accessed.  A 7-French bright tip sheath was placed.  A pigtail catheter was advanced into the suprarenal aorta and an  aortogram was performed under digital subtraction angiography.  The  pigtail catheter was brought down to the distal aortic bifurcation and a  runoff to the feet was performed using the bolus chase method under  digital subtraction angiography.  The runoff demonstrated severe common  iliac stenosis with patency of the remaining vessels and good runoff to  the foot.  The patient was given weight-based on fractionated heparin  with 5000 units.  A marker tape was placed so that the lesion could be  precisely located and an 8  x 24 mm genesis balloon expandable stent was  carefully positioned and then deployed at 12  atmospheres.  The stent was  well expanded.  Stent was removed and the pressure gradient across the  lesion was immediately resolved.  The pigtail catheter was advanced back  up into the distal aorta and an aortogram was performed.  This  demonstrated an excellent angiographic result with 0% residual stenosis  and good flow across the lesion.  There was no compromise of the  internal iliac artery.  The patient tolerated the procedure well.  A  Perclose device was used for femoral hemostasis.   PROCEDURAL FINDINGS:  The renal arteries are patent.  There is minor  atherosclerosis of the left renal artery with only 20-30% stenosis.  The  right renal artery in the right kidney appears small, but there is no  stenosis of the right renal artery visualized.  It is possible that the  catheter is positioned so that it is only opacifying a portion of the  right kidney.   The abdominal aorta is patent.   Right leg:  The right common iliac is patent with mild irregularity.  The internal and external iliacs are patent.  The common, deep, and  superficial femoral arteries are patent.  There  is three-vessel runoff  to the right foot.   Left leg:  The distal left common iliac artery has calcification and  severe 90-95% stenosis.  The external and internal iliacs are patent.  The common femoral is patent.  The SFA and deep femorals are patent.  There is three-vessel runoff to the foot, but the anterior tibial and  peroneal arteries fill slowly.   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   PLAN:  The patient will remain on dual antiplatelet therapy with aspirin  and Plavix for 1 month.  She should return for follow up as scheduled  with ABIs and a followup office visit in approximately 3 weeks.      Veverly Fells. Excell Seltzer, MD      MDC/MEDQ  D:  04/04/2010  T:  04/04/2010  Job:  [id number]   cc:   Everardo Beals.  Juanda Chance, MD, [facility]  Vonita Moss, MD   Electronically Signed by Tonny Bollman MD on 04/10/2010 06:02:46 AM

## 2010-08-27 NOTE — Op Note (Signed)
NAME:  [name] [name], [name] [name]                 ACCOUNT NO.:  [id number]   MEDICAL RECORD NO.:  [id number]          PATIENT TYPE:  INP   LOCATION:  0001                         FACILITY:  [facility]   PHYSICIAN:  Paola A. Duard Brady, MD    DATE OF BIRTH:  June 15, 1955   DATE OF PROCEDURE:  07/04/2008  DATE OF DISCHARGE:                               OPERATIVE REPORT   PREOPERATIVE DIAGNOSIS:  Complex right adnexal mass, minimally elevated  CA-125.   POSTOPERATIVE DIAGNOSIS:  Mucinous ovarian carcinoma, clinical stage I.   PROCEDURE:  Exploratory laparotomy TAH-BSO, right-sided pelvic and  periaortic lymph node, appendectomy, staging biopsies, omentectomy,  diaphragm Pap smear.   SURGEONS:  Paola A. Duard Brady, M.D., and Roseanna Rainbow, M.D.   ASSISTANT:  Telford Nab, R.N.   ANESTHESIA:  General.   ANESTHESIOLOGIST:  Jill Side, M.D.   ESTIMATED BLOOD LOSS:  250 mL.   URINE OUTPUT:  200 mL .   IV FLUIDS:  2500 mL.   SPECIMENS:  Included washings, uterus, bilateral tubes and ovaries,  right pelvic periaortic lymph nodes, omentum, appendectomy, right pelvic  and peritoneum, right paracolic peritoneum, anterior bladder and  posterior cul-de-sac biopsies, diaphragm Pap smear.   COMPLICATIONS:  None.   OPERATIVE FINDINGS:  Included a 20-cm right ovarian mass.  Frozen  section was consistent with a mucinous adenocarcinoma.  There is no  evidence of any extra-abdominal disease.   The patient was taken to the operating room and placed in supine  position where general anesthesia was induced.  She was then placed in  dorsal lithotomy position.  The abdomen was prepped in the usual  fashion.  The perineum and vagina were prepped in usual fashion.  The  Foley catheter was inserted in the bladder under sterile conditions.  The patient was then draped in the usual fashion.  Time-out was  performed to confirm the patient, procedure, antibiotic and allergy  status.  A vertical  infraumbilical incision was made with a knife and  carried down to the underlying fascia using Bovie cautery.  The fascia  was identified, scored in the midline and the fascial incision was  extended superiorly and inferiorly.  The rectus bellies were dissected  off the overlying fascia.  The perineum was identified, tented and  entered sharply.  The peritoneal incision was extended superiorly and  inferiorly with Bovie cautery.  The findings as above were noted.  Abdominal pelvic washings were obtained.  There was minimal adhesive  disease of the mass to epiploica on the rectosigmoid colon.  The mass  was elevated easily out of the pelvis.  We came across the utero-ovarian  with 2 Kelly clamps.  The right adnexa was amputated from the uterus and  sent for frozen section.  There was some bleeding from excoriations and  very friable tissue on the mesenteric side of the rectosigmoid colon.  This hemostasis was obtained using argon beam coagulator and Bovie  cautery where indicated.  The Bookwalter self-retaining retractor was  placed on the bed at this point and multiple points during the case to  ensure that  there was no significant pressure on the psoas muscles with  the retractor blades.  Intermittently, the retractor blades were  loosened.  The corpus was grasped with Kelly's.  The round ligament the  patient's right side was transected using monopolar cautery.  The  anterior and posterior leaves of broad ligament were opened.  The ureter  was identified.  A window was made between the IP and the ureter.  The  IP was clamped x2, transected and suture ligated.  We then skeletonized  the uterine artery on the patient's right side, and the bladder was  taken to the level below the cervicovaginal junction.  The uterine  vessels were clamped with Masterson clamps, transected and suture  ligated.  Our attention was then drawn to the patient's left side.  There was minimal adhesive disease of  the rectosigmoid colon to the  pelvic sidewall.  This was taken down sharply.  The adnexa on the  patient's left side was unremarkable.  We transected the round ligament  on the patient's left side.  The anterior and posterior leaves were  opened.  The ureter was identified, and a window was made between the IP  and the ureter.  The IP was clamped x2, transected and suture ligated.  Similarly to the right side, we skeletonized the uterine vessels.  The  uterine artery was clamped with a curved Masterson, transected and  suture ligated.  We continued down the cardinal ligaments with  individual pedicles being created.  Each pedicle was created, transected  and suture ligated.  We came around the surgical vaginal junction with  curved Masterson's.  The cervix was amputated from the vagina.  The  vaginal cuff angles were closed with figure-of-eight suture of 0 Vicryl.  The remainder of the vagina was closed with a figure-of-eight suture of  0 Vicryl.  At this point, the frozen section had returned consistent  with a mucinous adenocarcinoma.  Our attention was then drawn to the  omentum and palpation exploration of the upper abdomen.  There is no  evidence of any upper abdominal disease.  To perform the omentectomy,  the incision needed to be extended.  Therefore, the vertical midline  incision was then extended with the knife.  The omentum was identified.  An infracolic omentectomy was performed by skeletonizing the omental  pedicles off of the surface of the rest of the transverse colon.  We  then entered into the lesser sac.  Each pedicle was created, clamped  with Kelly clamps, transected and suture ligated.  In this fashion, a  total infracolic omentectomy was performed.  The pedicles were noted to  be hemostatic, and each suture was visualized.  We then repacked the  small bowel to allow visualization of the retroperitoneum in the aorta.  An incision was made in the peritoneum overlying  the aorta and the vena  cava with the Bovie cautery.  There was some slightly enlarged nodes  just below the level of the duodenum.  The periaortic lymph nodes were  removed by elevating the nodal bundle and with monopolar cautery,  creating small pedicles and coagulating them.  We went to the level  above the IMA but did not reach the level of the renal arteries.  There  was no palpable adenopathy above our most superior bundle.  The nodal  bundle was removed and handed off as periaortic lymph node.  We then  identified the ureter and deviated it laterally.  The vena cava was then  identified, and the nodal bundle overlying the vena cava was in a  similar fashion removed.  The area was noted to be hemostatic.   Our attention was then redrawn to the pelvis.  The paravesical space was  then opened, and a curved Deaver was placed in the paravesical space.  The pararectal space was similarly opened.  The nodal bundle overlying  the external iliac artery and vein was taken down using monopolar  cautery.  The genitofemoral nerve was spared.  A vein retractor was then  placed under the external iliac vein.  The obturator nerve and vein were  identified in the nodal bundle superior.  This was removed with complete  skeletonization.  The ureter was noted to be well medial.  The superior  vesicle was spared as were as noted above the general femoral and the  obturator nerve.  This area was noted to be hemostatic.  We then  performed peritoneal biopsies by tenting the peritoneum of the pelvic  sidewall, the right paracolic gutter, the bladder flap, the posterior  cul-de-sac, the left pelvic sidewall and left paracolic gutter.  Each of  these peritoneal areas were grasped, and small biopsies were removed  using monopolar cautery.  The abdomen and pelvis were then copiously  irrigated, and all pedicles were noted be hemostatic.  Our attention was  then drawn to the cecum.  A window was made between  the mesoappendix,  and the mesoappendix was clamped with a Burlisher clamp.  The pedicle  was created, and these appendix was tied off.  We then crushed the  appendix with a snap.  We milked the appendix.  Two separate ties were  placed in the crush of the appendix.  Appendix was then transected, and  appendiceal stump was coagulated using cautery.  It was again noted to  be hemostatic.  The small bowel was run from the cecum to the ligament  of Treitz, and there was noted to be no nodularity or masses.  There was  one small cecal adhesion that was taken down and handed off as a  specimen.  A diaphragm Pap was then performed of the right  hemidiaphragm.  Again, the abdomen and pelvis were copiously irrigated.  All pedicles were noted to be hemostatic.  There continued to be some  raw surface on the mesenteric side of the rectosigmoid colon.  Again,  coagulation was obtained using Argon beam coagulator, and a sheet of  Gelfoam was placed.  All laparotomy sponges were removed as were the  retractors.  All counts were correct.  The fascia was closed using a  running suture of #1 PDS.  The subcutaneous tissues were irrigated.  The  skin was closed using skin clips.   The patient tolerated the procedure well and was taken to the recovery  room in stable condition.  All instrument, needle and laparotomy counts  were correct x2.      Paola A. Duard Brady, MD  Electronically Signed     PAG/MEDQ  D:  07/04/2008  T:  07/04/2008  Job:  [id number]   cc:   Telford Nab, R.N.  [address]  [place], [place] [address]   Eliberto Ivory. Rosalio Macadamia, M.D.  Fax: [phone]   Everardo Beals. Juanda Chance, MD, [facility]  [address]  [place], [place] [address]   Chaney Born, M.D.

## 2010-08-27 NOTE — Discharge Summary (Signed)
NAME:  [name] [name], [name] [name] NO.:  [id number]   MEDICAL RECORD NO.:  [id number]          PATIENT TYPE:  INP   LOCATION:  1522                         FACILITY:  [facility]   PHYSICIAN:  Roseanna Rainbow, M.D.DATE OF BIRTH:  November 27, 1955   DATE OF ADMISSION:  07/04/2008  DATE OF DISCHARGE:  07/11/2008                               DISCHARGE SUMMARY   CHIEF COMPLAINT:  The patient is a 55 year old para 1 with a complex  right-sided adnexal mass who presents for operative management.  Please  see the dictated history and physical as per Dr. Rockney Ghee.   HOSPITAL COURSE:  The patient was admitted and underwent an exploratory  laparotomy, total abdominal hysterectomy with bilateral salpingo-  oophorectomy, staging and appendectomy.  Please see the dictated  operative summary.  On postoperative day #1, her hemoglobin was 14.4, a  basic metabolic profile was remarkable for a potassium of 5.3.  She was  started on sips of clears and encouraged to ambulate.  On postoperative  day #2, the potassium was 4.  She had an episode of tachycardia.  [facility] was consulted.  This resolved.  Her diet was advanced  slowly.  It was felt that she had a mild ileus.  A repeat basic  metabolic profile on postoperative day #6 was remarkable for mild  hyponatremia with a serum sodium of 129.  The white blood cell count was  normal.  A metabolic profile on postoperative day #7 showed a normal  serum sodium.  She was tolerating a regular diet.   DISCHARGE DIAGNOSIS:  Stage 1A ovarian carcinoma.   PROCEDURES:  1. Exploratory laparotomy.  2. Total abdominal hysterectomy, bilateral salpingo-oophorectomy.  3. Staging.  4. Appendectomy.   CONDITION:  Stable.   DIET:  Regular.   ACTIVITY:  Pelvic rest.  Progressive activity.   MEDICATIONS:  1. Levoxyl.  2. Benazepril.  3. Metoprolol.  4. Nexium.  5. Baby aspirin.  6. Lovastatin.  7. Percocet 1-2 tablets every 6 hours as  needed.  8. Lovenox 40 mg subcutaneous daily x14 days.   DISPOSITION:  The patient was to follow up with GYN oncology and [facility] as needed.      Roseanna Rainbow, M.D.  Electronically Signed     LAJ/MEDQ  D:  08/08/2008  T:  08/08/2008  Job:  [id number]   cc:   Telford Nab, R.N.  [address]  [place], [place] [address]   Eliberto Ivory. Rosalio Macadamia, M.D.  Fax: [phone]   Everardo Beals. Juanda Chance, MD, [facility]  [address]  [place], [place] [address]   Wilnette Kales(?), M.D.

## 2010-08-27 NOTE — Consult Note (Signed)
NAME:  [name] [name], [name] [name]                 ACCOUNT NO.:  [id number]   MEDICAL RECORD NO.:  [id number]          PATIENT TYPE:  OUT   LOCATION:  GYN                          FACILITY:  [facility]   PHYSICIAN:  Paola A. Duard Brady, MD    DATE OF BIRTH:  1955-10-28   DATE OF CONSULTATION:  10/25/2008  DATE OF DISCHARGE:                                 CONSULTATION   HISTORY:  The patient is a 55 year old with a stage 1A grade 2 mucinous  adenocarcinoma.  She was initially referred to [place] for a large abdominal  pelvic mass that measured 11.9 x 9.4 x 10.2 cm which was complex with a  CA-125 that was elevated at 46.  July 04, 2008, she underwent  exploratory laparotomy, TAH-BSO, right-sided pelvic lymph node  dissection, appendectomy, staging biopsies, omentectomy.  Again, final  pathology was consistent with stage 1A grade 2 mucinous adenocarcinoma  arising from the ovary.  All biopsies were negative.  There was no  lymphovascular space involvement.  All lymph nodes were negative.  She  comes in today for followup.  We last saw her in April 2010 at which  time her exam was unremarkable and her CA-125 was normal.  Since that  time, she has been doing fairly well.  She has been having issues with  plantar fasciitis and is getting orthotics.  She also has some taping on  her bilateral feet.  She is having some difficulty sleeping.  It is hard  to know if the hot flashes or waking her up or she wakes up and then has  hot flashes which prohibit her going back to sleep.  She has about 3-4 a  night.  She did try some Estroven without significant help.  She states  she is able to fall asleep, but has intermittent wakening again about 3-  4 times a night.  She is going to be looking into some over-the-counter  sleep aids.  She continues to have quit smoking, has not started back  and is very proud of herself.  She otherwise denies any abdominal pain,  nausea, vomiting, fevers, chills, headaches, visual changes,  change in  bowel or bladder habits.   PHYSICAL EXAMINATION:  VITAL SIGNS:  Weight 180 pounds, blood pressure  118/62.  GENERAL:  Well-nourished, well-developed female in no acute distress.  NECK:  Supple.  There is no lymphadenopathy, no thyromegaly.  LUNGS:  Clear to auscultation bilaterally.  CARDIOVASCULAR:  Regular rate and rhythm.  ABDOMEN:  Shows a well-healed vertical midline incision.  Abdomen is  soft, nontender, nondistended.  There are no palpable masses or  hepatosplenomegaly.  Groins are negative for adenopathy.  EXTREMITIES:  There is no edema.  She does have some wrapping along the  arches of her bilateral feet.  PELVIC:  External genitalia is within normal limits.  Bimanual  examination reveals no masses or nodularity.  There is a large amount of  stool in the rectal vault.  Rectal confirms no masses or nodularity.   ASSESSMENT:  A 55 year old with a stage 1A grade 2 mucinous ovarian  carcinoma  who has no evidence of recurrent disease.   PLAN:  1. We will check a CA-125 from today.  2. She did have follow up mammogram which was negative earlier in the      spring.  3. She has yet to have her colonoscopy.  She was encouraged to get      this scheduled.  4. Discussed with her that mucinous carcinoma of the ovary are      uncommon and we need to rule out another primary site.  She states      she will do so.  5. She will return to see me in 3 months for an exam and a CA-125.      Paola A. Duard Brady, MD  Electronically Signed     PAG/MEDQ  D:  10/25/2008  T:  10/25/2008  Job:  [id number]   cc:   Telford Nab, R.N.  [address]  [place], [place] [address]   Eliberto Ivory. Rosalio Macadamia, M.D.  Fax: [phone]   Everardo Beals. Juanda Chance, MD, [facility]  [address]  [place], [place] [address]   Merrilee Seashore, MD

## 2010-08-27 NOTE — Consult Note (Signed)
NAME:  [name] [name], [name] [name] NO.:  [id number]   MEDICAL RECORD NO.:  [id number]          PATIENT TYPE:  INP   LOCATION:  1522                         [facility]ACILITY:  [facility]   PHYSICIAN:  Bevelyn Buckles. Bensimhon, MDDATE OF BIRTH:  1955/04/29   DATE OF CONSULTATION:  07/05/2008  DATE OF DISCHARGE:                                 CONSULTATION   PRIMARY CARDIOLOGIST:  Dr. Charlies Constable.   PRIMARY CARE PHYSICIAN:  Dr. Annabell Howells.   REASON FOR CONSULTATION:  Tachycardia.   HISTORY OF PRESENT ILLNESS:  A 55 year old Caucasian female with known  history of hypertension, hypothyroidism, complete heart block status  post pacemaker implantation with a history of tachycardia induced  cardiomyopathy who was admitted for hysterectomy in the setting of  pelvic mass.  Postop ECG was showing a probable PMT.  However, the  patient was seen by [facility]. Jude pacemaker interrogator and was found to  have an intrinsic rate of 80-90 beats per minute.  Of note, the patient  was seen by Dr. Verne Carrow for preop evaluation on June 23, 2008 and the pacemaker was interrogated.  At that time found to be  functioning appropriately with no evidence of tachycardia per histogram.  We are asked to see the patient secondary to tachycardia.  The pacemaker  interrogation shows sinus tachycardia with pacemaker tracking  appropriately.   REVIEW OF SYSTEMS:  Positive for palpitations, depression, anxiety and  abdominal pain postoperatively.   PAST MEDICAL HISTORY:  Hypertension, hypothyroidism, GERD pacemaker  secondary to complete heart block, St. Jude in 1996, tachycardic induced  cardiomyopathy.  Last echocardiogram was in February 2009 revealing an  EF of 40% with moderate diffuse LV hypokinesis.   PAST SURGICAL HISTORY:  Pacemaker implantation in 1996, status post  hysterectomy this admission.   SOCIAL HISTORY:  She lives in [place] with her husband.  She works at  [employer].  She is  married.  She has 35 pack year smoker and  drinks 4 to 6 beers daily.  Denies drug use.   FAMILY HISTORY:  Mother with hypercholesterolemia.  Father deceased with  lung cancer.   CURRENT MEDICATIONS:  1. Metoprolol 50 mg 1-1/2 tablet b.i.d.  2. Benazepril 10 mg daily.  3. Aspirin 81 mg daily.  4. Zocor 10 mg daily.  5. Levothyroxine 75 mg daily.  6. Protonix 80 mg daily.   ALLERGIES:  LATEX.   CURRENT LABS:  Sodium 136, potassium 5.3, chloride 104, CO2 26, BUN 6,  creatinine 0.9, glucose 148, hemoglobin 14.4, hematocrit 42.3, white  blood cells 12.4, platelets 226,000.  Chest x-ray no active  cardiopulmonary disease.  EKG revealing pacemaker tracking with a  ventricular rate of 116 beats per minute.   PHYSICAL EXAMINATION:  VITAL SIGNS:  Blood pressure 107/71, pulse 98,  respirations 18, temperature 99, O2 sat 94% on 2L.  GENERAL:  She is awake, alert, oriented, mildly anxious and tearful.  HEENT:  Head is normocephalic and atraumatic.  EYES:  PERRLA.  Mucous membranes mouth pink and moist.  Tongue is  midline.  NECK:  Supple without bruit JVD.  CARDIOVASCULAR:  Tachycardia with 1/6 systolic murmur auscultated.  Pulses are 2+ equal without bruits.  LUNGS:  Clear to auscultation inferolaterally.  ABDOMEN:  Tender postoperatively with hypoactive bowel sounds.  Dressing  intact.  EXTREMITIES:  Without clubbing, cyanosis or edema.  NEURO:  Cranial nerves II-XII are grossly intact.   IMPRESSION:  1. Tachycardia.  2. Status post hysterectomy secondary to pelvic mass.  3. Status post St. Jude pacemaker secondary to complete heart block in      1996.  4. History of hypertension.  5. History of tachycardia induced cardiomyopathy.   PLAN:  A 55 year old Caucasian female status post hysterectomy with  tachycardia with a St. Jude's pacemaker being interrogated at present  revealing intrinsic rate of 76-80 beats per minute with pacemaker  tracking.  The patient is on metoprolol 75  mg p.o. b.i.d.  We will give  one dose of IV Lopressor 5 mg and would continue current medication  regimen of beta blocker dosing as you are.  Will recommend IV fluids and  pain control and we will follow making further recommendations  throughout hospital course.      Bettey Mare. Lyman Bishop, NP      Bevelyn Buckles. Bensimhon, MD  Electronically Signed    KML/MEDQ  D:  07/05/2008  T:  07/05/2008  Job:  [id number]   cc:   Dr. Annabell Howells
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
## 2010-09-06 ENCOUNTER — Encounter: Payer: BC Managed Care – PPO | Admitting: *Deleted

## 2010-09-06 ENCOUNTER — Ambulatory Visit (INDEPENDENT_AMBULATORY_CARE_PROVIDER_SITE_OTHER): Payer: BC Managed Care – PPO | Admitting: *Deleted

## 2010-09-06 DIAGNOSIS — I442 Atrioventricular block, complete: Secondary | ICD-10-CM

## 2010-09-06 NOTE — Progress Notes (Signed)
Battery check only 

## 2010-09-26 ENCOUNTER — Ambulatory Visit (INDEPENDENT_AMBULATORY_CARE_PROVIDER_SITE_OTHER): Payer: BC Managed Care – PPO | Admitting: *Deleted

## 2010-09-26 DIAGNOSIS — R0989 Other specified symptoms and signs involving the circulatory and respiratory systems: Secondary | ICD-10-CM

## 2010-09-26 DIAGNOSIS — I442 Atrioventricular block, complete: Secondary | ICD-10-CM

## 2010-10-11 ENCOUNTER — Ambulatory Visit: Payer: Self-pay

## 2010-10-18 ENCOUNTER — Telehealth: Payer: Self-pay | Admitting: Internal Medicine

## 2010-10-18 NOTE — Telephone Encounter (Signed)
Patient has been having some leg pain.  Will have her follow up with Dr.Cooper

## 2010-10-18 NOTE — Telephone Encounter (Signed)
Pt has a question re device. Pt does not want to give any more info. Pt only wants to talk to [name] [name]

## 2010-10-25 ENCOUNTER — Encounter: Payer: Self-pay | Admitting: Internal Medicine

## 2010-10-30 ENCOUNTER — Encounter: Payer: Self-pay | Admitting: Internal Medicine

## 2010-10-30 ENCOUNTER — Ambulatory Visit (INDEPENDENT_AMBULATORY_CARE_PROVIDER_SITE_OTHER): Payer: BC Managed Care – PPO | Admitting: Internal Medicine

## 2010-10-30 ENCOUNTER — Other Ambulatory Visit: Payer: Self-pay | Admitting: Internal Medicine

## 2010-10-30 ENCOUNTER — Encounter: Payer: Self-pay | Admitting: *Deleted

## 2010-10-30 DIAGNOSIS — Z950 Presence of cardiac pacemaker: Secondary | ICD-10-CM

## 2010-10-30 DIAGNOSIS — I429 Cardiomyopathy, unspecified: Secondary | ICD-10-CM

## 2010-10-30 DIAGNOSIS — I70219 Atherosclerosis of native arteries of extremities with intermittent claudication, unspecified extremity: Secondary | ICD-10-CM

## 2010-10-30 DIAGNOSIS — I442 Atrioventricular block, complete: Secondary | ICD-10-CM

## 2010-10-30 DIAGNOSIS — Z01810 Encounter for preprocedural cardiovascular examination: Secondary | ICD-10-CM

## 2010-10-30 LAB — PACEMAKER DEVICE OBSERVATION
AL AMPLITUDE: 2 mv
AL IMPEDENCE PM: 613 Ohm
AL THRESHOLD: 2.5 V
BATTERY VOLTAGE: 2.33 V

## 2010-10-30 NOTE — Assessment & Plan Note (Signed)
She has class 1 CHF symptoms despite moderate LV dysfunction (EF 40-45%).

## 2010-10-30 NOTE — Assessment & Plan Note (Signed)
Her symptoms have recurred. She is schedule for repeat ABI's and followup with Dr. Excell Seltzer.

## 2010-10-30 NOTE — Assessment & Plan Note (Signed)
She is at device ERI. I have reviewed her interogation today and her leads are satisfactory. She will undergo PPM generator change in the coming weeks.

## 2010-10-30 NOTE — Progress Notes (Signed)
HPI [name] [name] is referred today by Dr. Excell Seltzer to establish for ongoing PPM followup. She is a pleasant 55 yo woman with congenital CHB. She is s/p PPM 16 yrs ago. She has done well. She has no significant CHF symptoms. She does have some arthritis and peripheral vascular disease. No c/p or sob. Minimal peripheral edema. No syncope. She has had some recurrent claudication after stenting.  Allergies  Allergen Reactions  . Latex   . Nickel      Current Outpatient Prescriptions  Medication Sig Dispense Refill  . aspirin 81 MG tablet Take 81 mg by mouth daily.        . benazepril (LOTENSIN) 10 MG tablet Take 10 mg by mouth daily.        . calcium carbonate (TUMS EX) 750 MG chewable tablet Chew 1 tablet by mouth daily.        . clonazePAM (KLONOPIN) 0.5 MG tablet Take 0.5 mg by mouth as needed.        Marland Kitchen levothyroxine (SYNTHROID, LEVOTHROID) 100 MCG tablet Take 100 mcg by mouth daily.        Marland Kitchen lovastatin (MEVACOR) 20 MG tablet Take 20 mg by mouth at bedtime.        . metoprolol (LOPRESSOR) 50 MG tablet Take 75 mg by mouth 2 (two) times daily.           Past Medical History  Diagnosis Date  . Hyperlipidemia   . Hypothyroid   . Complete AV block     s/p pacemaker insertion 1996  . Cardiomyopathy     tachycardia induced. EF 35-40% 06/2008  . Ovarian cancer     ROS:   All systems reviewed and negative except as noted in the HPI.   Past Surgical History  Procedure Date  . Pacemaker insertion 1996  . Knee surgery   . Total abdominal hysterectomy w/ bilateral salpingoophorectomy 07/2008    Ovarian cancer     Family History  Problem Relation Age of Onset  . Cancer Father     died of lung cancer  . Diabetes Father      History   Social History  . Marital Status: Married    Spouse Name: N/A    Number of Children: N/A  . Years of Education: N/A   Occupational History  . Not on file.   Social History Main Topics  . Smoking status: Former Smoker    Quit date: 06/12/2008   . Smokeless tobacco: Not on file  . Alcohol Use: 0.6 oz/week    1 Cans of beer per week     4-6 cans beer/ night  . Drug Use: No  . Sexually Active: Not on file   Other Topics Concern  . Not on file   Social History Narrative  . No narrative on file     BP 115/75  Pulse 56  Ht 5\' 5"  (1.651 m)  Wt 196 lb (88.905 kg)  BMI 32.62 kg/m2  Physical Exam:  Well appearing NAD HEENT: Unremarkable Neck:  No JVD, no thyromegally Lymphatics:  No adenopathy Back:  No CVA tenderness Lungs:  Clear. Well healed PPM incision. HEART:  Regular rate rhythm, no murmurs, no rubs, no clicks. S2 is split. Abd:  soft, positive bowel sounds, no organomegally, no rebound, no guarding Ext:  2 plus pulses, no edema, no cyanosis, no clubbing Skin:  No rashes no nodules Neuro:  CN II through XII intact, motor grossly intact  DEVICE  Normal device function.  See PaceArt for details. Device is at [facility].   Assess/Plan:

## 2010-11-04 ENCOUNTER — Other Ambulatory Visit (INDEPENDENT_AMBULATORY_CARE_PROVIDER_SITE_OTHER): Payer: BC Managed Care – PPO | Admitting: *Deleted

## 2010-11-04 DIAGNOSIS — Z5181 Encounter for therapeutic drug level monitoring: Secondary | ICD-10-CM

## 2010-11-04 DIAGNOSIS — I442 Atrioventricular block, complete: Secondary | ICD-10-CM

## 2010-11-04 DIAGNOSIS — I4430 Unspecified atrioventricular block: Secondary | ICD-10-CM

## 2010-11-04 DIAGNOSIS — Z01810 Encounter for preprocedural cardiovascular examination: Secondary | ICD-10-CM

## 2010-11-04 DIAGNOSIS — Z950 Presence of cardiac pacemaker: Secondary | ICD-10-CM

## 2010-11-11 ENCOUNTER — Ambulatory Visit (HOSPITAL_COMMUNITY)
Admission: RE | Admit: 2010-11-11 | Discharge: 2010-11-11 | Disposition: A | Discharge status: Home / Self Care | Payer: BC Managed Care – PPO | Source: Ambulatory Visit | Attending: Internal Medicine | Admitting: Internal Medicine

## 2010-11-11 DIAGNOSIS — I442 Atrioventricular block, complete: Secondary | ICD-10-CM | POA: Insufficient documentation

## 2010-11-11 DIAGNOSIS — Z45018 Encounter for adjustment and management of other part of cardiac pacemaker: Secondary | ICD-10-CM | POA: Insufficient documentation

## 2010-11-12 ENCOUNTER — Telehealth: Payer: Self-pay | Admitting: Internal Medicine

## 2010-11-12 NOTE — Telephone Encounter (Signed)
Pt needs to verify when she can go back to work.  She has been told two different dates.

## 2010-11-13 ENCOUNTER — Encounter: Payer: Self-pay | Admitting: *Deleted

## 2010-11-13 NOTE — Telephone Encounter (Signed)
Spoke with patient and told her she could go back to work on 11/18/10

## 2010-11-13 NOTE — Telephone Encounter (Signed)
rtn call back to nurse.  

## 2010-11-13 NOTE — Telephone Encounter (Signed)
Left message for patient to call me back. 

## 2010-11-14 ENCOUNTER — Telehealth: Payer: Self-pay | Admitting: Internal Medicine

## 2010-11-14 NOTE — Telephone Encounter (Signed)
Patient needs for the letter to return to work to include work details and limitations.  Please advise.

## 2010-11-14 NOTE — Telephone Encounter (Signed)
Note is in chart under letters

## 2010-11-18 ENCOUNTER — Encounter: Payer: BC Managed Care – PPO | Admitting: Cardiology

## 2010-11-18 ENCOUNTER — Encounter: Payer: BC Managed Care – PPO | Admitting: *Deleted

## 2010-11-19 ENCOUNTER — Encounter (INDEPENDENT_AMBULATORY_CARE_PROVIDER_SITE_OTHER): Payer: BC Managed Care – PPO | Admitting: *Deleted

## 2010-11-19 ENCOUNTER — Other Ambulatory Visit: Payer: Self-pay | Admitting: Cardiology

## 2010-11-19 DIAGNOSIS — I700 Atherosclerosis of aorta: Secondary | ICD-10-CM

## 2010-11-19 DIAGNOSIS — I739 Peripheral vascular disease, unspecified: Secondary | ICD-10-CM

## 2010-11-21 ENCOUNTER — Encounter: Payer: Self-pay | Admitting: Cardiovascular Disease

## 2010-11-21 ENCOUNTER — Ambulatory Visit (INDEPENDENT_AMBULATORY_CARE_PROVIDER_SITE_OTHER): Payer: BC Managed Care – PPO | Admitting: *Deleted

## 2010-11-21 DIAGNOSIS — I442 Atrioventricular block, complete: Secondary | ICD-10-CM

## 2010-11-21 NOTE — Progress Notes (Signed)
Wound check pacer 

## 2010-11-22 NOTE — Op Note (Signed)
  [name], [name] [name] NO.:  [id number]  MEDICAL RECORD NO.:  [id number]  LOCATION:  [facility]                         FACILITY:  [facility]  PHYSICIAN:  Doylene Canning. Ladona Ridgel, MD    DATE OF BIRTH:  1956/01/13  DATE OF PROCEDURE:  11/11/2010 DATE OF DISCHARGE:                              OPERATIVE REPORT   ELECTROPHYSIOLOGIC PROCEDURE NOTE  PROCEDURE PERFORMED:  Removal of previously implanted dual-chamber pacemaker and insertion of a new dual-chamber pacemaker.  INDICATIONS:  Symptomatic complete heart block status post pacemaker with a current device at end-of-life.  INTRODUCTION:  The patient is a 55 year old woman with complete heart block status post permanent pacemaker insertion over 16 years ago.  She has reached device at end-of-life on her Paragon II St. Jude device and is now referred for insertion of a new device.  PROCEDURE:  After informed obtained, the patient was taken to the diagnostic EP lab in the fasting state.  After usual preparation and draping, intravenous fentanyl and midazolam was given for sedation. Lidocaine 30 mL was infiltrated into the right infraclavicular region. A 5-cm incision was carried out over this region.  Electrocautery was utilized to dissect down to the fascial plane.  The pacemaker generator was entered without difficulty and removed with gentle traction.  The atrial lead was evaluated and found to be working satisfactorily with threshold of 1.5 volts at 0.5 milliseconds.  The pacing impedance was 490 ohms.  With the atrial lead in satisfactory position, attention was turned in placement of ventricular lead.  There were no R-waves. Threshold was 1.1 volts at 0.5 milliseconds.  Pace impedance was 800 ohms.  At this point, the pocket was irrigated with antibiotic irrigation.  The new St. Jude dual-chamber pacemaker, serial number [id number] was connected to the atrial and ventricular leads, and placed back in the subcutaneous pocket.   To accommodate the larger size pacemaker, the pocket was revised in the superior location. Electrocautery was utilized to assure hemostasis.  Additional antibiotic irrigation was used to irrigate the pocket and the incision closed with 2-0 and 3-0 Vicryl.  Benzoin and Steri-Strips were painted on the skin, a pressure dressing was applied, and the patient was returned to her room in satisfactory condition.  COMPLICATIONS:  There were no immediate procedure complications.  RESULTS:  This demonstrates successful removal of previously implanted St. Jude dual-chamber pacemaker at end-of-life and insertion of a new St. Jude pacemaker with pacemaker pocket revision.     Doylene Canning. Ladona Ridgel, MD     GWT/MEDQ  D:  11/11/2010  T:  11/11/2010  Job:  [id number]  cc:   Antonieta Iba, MD  Electronically Signed by Lewayne Bunting MD on 11/22/2010 09:52:56 AM

## 2011-02-14 ENCOUNTER — Encounter: Payer: Self-pay | Admitting: Cardiovascular Disease

## 2011-02-14 ENCOUNTER — Ambulatory Visit (INDEPENDENT_AMBULATORY_CARE_PROVIDER_SITE_OTHER): Payer: BC Managed Care – PPO | Admitting: Cardiovascular Disease

## 2011-02-14 DIAGNOSIS — I70219 Atherosclerosis of native arteries of extremities with intermittent claudication, unspecified extremity: Secondary | ICD-10-CM

## 2011-02-14 DIAGNOSIS — E785 Hyperlipidemia, unspecified: Secondary | ICD-10-CM

## 2011-02-14 NOTE — Patient Instructions (Signed)
Your physician wants you to follow-up in:12 months.  You will receive a reminder letter in the mail two months in advance. If you don't receive a letter, please call our office to schedule the follow-up appointment.  Your physician has requested that you have an ankle brachial index (ABI). During this test an ultrasound and blood pressure cuff are used to evaluate the arteries that supply the arms and legs with blood. Allow thirty minutes for this exam. There are no restrictions or special instructions. To be done in August 2013.   Your physician has requested that you have an abdominal aorta duplex. During this test, an ultrasound is used to evaluate the aorta. Allow 30 minutes for this exam. Do not eat after midnight the day before and avoid carbonated beverages. To be done in August 2013.

## 2011-02-14 NOTE — Progress Notes (Signed)
HPI:  This is a 55 year old woman presenting for followup evaluation. The patient has lower extremity peripheral arterial disease and intermittent claudication. She was diagnosed last year with severe left iliac stenosis and was treated with stenting of the left common iliac artery using a balloon expandable stent. Her initial ABIs were normal on the right and 0.78 on the left. Followup ABIs after her procedure were normal bilaterally at 1.1 on both sides.  The patient continues to have left calf pain with ambulation. Her symptoms improved following stenting but they have never fully resolved. She can walk about 2 blocks before developing calf aching. This only occurs on the left side. Symptoms resolve with rest. She denies thigh pain. She denies ulcerations or rest pain. She has had a followup ABI which was normal. She denies chest pain, dyspnea, or palpitations. She reports compliance with her medications.  Outpatient Encounter Prescriptions as of 02/14/2011  Medication Sig Dispense Refill  . aspirin 81 MG tablet Take 81 mg by mouth daily.        . benazepril (LOTENSIN) 10 MG tablet Take 10 mg by mouth daily.        . calcium carbonate (TUMS EX) 750 MG chewable tablet Chew 1 tablet by mouth daily.        . clonazePAM (KLONOPIN) 0.5 MG tablet Take 0.5 mg by mouth as needed.        Marland Kitchen levothyroxine (SYNTHROID, LEVOTHROID) 100 MCG tablet Take 100 mcg by mouth daily.        Marland Kitchen lovastatin (MEVACOR) 20 MG tablet Take 20 mg by mouth at bedtime.        . metoprolol (LOPRESSOR) 50 MG tablet Take 75 mg by mouth 2 (two) times daily.          Allergies  Allergen Reactions  . Contrast Media (Iodinated Diagnostic Agents)   . Latex   . Nickel     Past Medical History  Diagnosis Date  . Hyperlipidemia   . Hypothyroid   . Complete AV block     s/p pacemaker insertion 1996  . Cardiomyopathy     tachycardia induced. EF 35-40% 06/2008  . Ovarian cancer     ROS: Negative except as per HPI  BP 108/66   Pulse 65  Ht 5\' 5"  (1.651 m)  Wt 202 lb (91.627 kg)  BMI 33.61 kg/m2  PHYSICAL EXAM: Pt is alert and oriented, NAD HEENT: normal Neck: JVP - normal, carotids 2+= without bruits Lungs: CTA bilaterally CV: RRR without murmur or gallop Abd: soft, NT, Positive BS, no hepatomegaly Ext: no C/C/E, femoral pulses are 2+ and equal, DP and PT pulses are 2+ and equal. Skin: warm/dry no rash  ASSESSMENT AND PLAN:

## 2011-02-14 NOTE — Assessment & Plan Note (Signed)
The patient is on lovastatin. She is followed by her primary care physician.

## 2011-02-14 NOTE — Assessment & Plan Note (Signed)
The patient's physical exam and duplex scan suggest good arterial supply to the left foot. Her pulse exam is normal. Her ABI is 1.1. Her symptoms still sound typical of claudication. I don't think there is any reason to repeat an angiogram as she clearly has adequate blood supply to the foot. I recommended a trial of Pletal but the patient does not want to take any more medicine. She will continue with a walking program and I went over this in detail.

## 2011-02-19 ENCOUNTER — Ambulatory Visit: Payer: BC Managed Care – PPO | Attending: Gynecologic Oncology | Admitting: Gynecologic Oncology

## 2011-02-19 ENCOUNTER — Ambulatory Visit: Payer: BC Managed Care – PPO

## 2011-02-19 ENCOUNTER — Encounter: Payer: Self-pay | Admitting: Gynecologic Oncology

## 2011-02-19 VITALS — BP 106/50 | HR 62 | Temp 97.9°F | Resp 16 | Ht 65.35 in | Wt 199.8 lb

## 2011-02-19 DIAGNOSIS — I739 Peripheral vascular disease, unspecified: Secondary | ICD-10-CM | POA: Insufficient documentation

## 2011-02-19 DIAGNOSIS — R1903 Right lower quadrant abdominal swelling, mass and lump: Secondary | ICD-10-CM | POA: Insufficient documentation

## 2011-02-19 DIAGNOSIS — Z79899 Other long term (current) drug therapy: Secondary | ICD-10-CM | POA: Insufficient documentation

## 2011-02-19 DIAGNOSIS — Z950 Presence of cardiac pacemaker: Secondary | ICD-10-CM | POA: Insufficient documentation

## 2011-02-19 DIAGNOSIS — R109 Unspecified abdominal pain: Secondary | ICD-10-CM | POA: Insufficient documentation

## 2011-02-19 DIAGNOSIS — M79609 Pain in unspecified limb: Secondary | ICD-10-CM | POA: Insufficient documentation

## 2011-02-19 DIAGNOSIS — R143 Flatulence: Secondary | ICD-10-CM | POA: Insufficient documentation

## 2011-02-19 DIAGNOSIS — R142 Eructation: Secondary | ICD-10-CM | POA: Insufficient documentation

## 2011-02-19 DIAGNOSIS — Z7982 Long term (current) use of aspirin: Secondary | ICD-10-CM | POA: Insufficient documentation

## 2011-02-19 DIAGNOSIS — E785 Hyperlipidemia, unspecified: Secondary | ICD-10-CM | POA: Insufficient documentation

## 2011-02-19 DIAGNOSIS — E039 Hypothyroidism, unspecified: Secondary | ICD-10-CM | POA: Insufficient documentation

## 2011-02-19 DIAGNOSIS — C569 Malignant neoplasm of unspecified ovary: Secondary | ICD-10-CM

## 2011-02-19 DIAGNOSIS — R141 Gas pain: Secondary | ICD-10-CM | POA: Insufficient documentation

## 2011-02-19 DIAGNOSIS — Z8543 Personal history of malignant neoplasm of ovary: Secondary | ICD-10-CM | POA: Insufficient documentation

## 2011-02-19 DIAGNOSIS — I4430 Unspecified atrioventricular block: Secondary | ICD-10-CM | POA: Insufficient documentation

## 2011-02-19 DIAGNOSIS — I10 Essential (primary) hypertension: Secondary | ICD-10-CM | POA: Insufficient documentation

## 2011-02-19 NOTE — Progress Notes (Signed)
Consult Note: Gyn-Onc  [name] [name] 55 y.o. female  CC:  Chief Complaint  Patient presents with  . Follow-up    Known to Dr [name] [name], abd pelvic mass    HPI: Ms. [name] [name] is a very pleasant 55 year old who is referred to [place] for a large abdominal pelvic mass arising from the right adnexa. CA 125 is 46. When we saw her masses over 0.9 x 9.4 x 10.2 cm and complex. In March of 2010 Gen. expiratory laparotomy TAH/BSO right-sided pelvic lymph node dissection appendectomy staging biopsies and omentectomy. Operative findings included a 20 cm right ovarian mass. 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 I last saw her in January of 2011. At that time her exam was unremarkable and her CA 125 is 9.3. She did not want to return to see [place] due to concerns regarding extensive facility fees. She was recently seen by her primary physician she'll currently recommended that she return to see [place] for followup evaluation. She has had multiple medical procedures done since we last saw her. She had a left iliac stent placed in December of 2011 for claudication symptoms. She still has some leg pain this felt not to be related to the circulatory system. She is walking about 30 minutes a day. This procedure was performed by Dr. Excell [name] at [facility]ology. She also underwent a pacemaker replacement in July 2012.  Interval History: Interval history is as above. Performance status   Review of Systems: The patient complains of some abdominal pain it comes and goes and her abdomen feels hard. She thinks that she has increased gas pain. She's had more gas pain since her surgery. Her symptoms of abdominal pain have been going on for about a year and have not changed over that period of time. The abdominal pain does not wake her up at night. She denies any early satiety. In fact, she states she's hungry all the time she gained approximately 12 pounds this year. Review  systems otherwise negative for any chest pain shortness of breath nausea vomiting fevers chills chest pain or shortness of breath.  Constitutional  Feels well  Skin/Breast  No rash, sores, jaundice, itching, dryness, lumps, swelling, breast pain, or nipple discharge. Age spots  Cardiovascular  No chest pain, shortness of breath, or edema  Pulmonary  No cough or wheeze.  Gastro Intestinal  No nausea, vomitting, or diarrhoea. No bright red blood per rectum, no abdominal pain, change in bowel movement, or constipation.  Genito Urinary  No frequency, urgency, dysuria,  Musculo Skeletal  No myalgia, arthralgia, joint swelling or pain  Neurologic  No weakness, numbness, change in gait,  Psychology  No depression, anxiety, insomnia.    Current Meds:  Outpatient Encounter Prescriptions as of 02/19/2011  Medication Sig Dispense Refill  . aspirin 81 MG tablet Take 81 mg by mouth daily.        . benazepril (LOTENSIN) 10 MG tablet Take 10 mg by mouth daily.        . calcium carbonate (TUMS EX) 750 MG chewable tablet Chew 2 tablets by mouth daily.       . clonazePAM (KLONOPIN) 0.5 MG tablet Take 0.5 mg by mouth as needed.        Marland Kitchen levothyroxine (SYNTHROID, LEVOTHROID) 100 MCG tablet Take 100 mcg by mouth daily.        Marland Kitchen lovastatin (MEVACOR) 20 MG tablet Take 20 mg by mouth at bedtime.        Marland Kitchen  metoprolol (LOPRESSOR) 50 MG tablet Take 75 mg by mouth 2 (two) times daily.          Allergy:  Allergies  Allergen Reactions  . Contrast Media (Iodinated Diagnostic Agents)   . Latex   . Nickel     Social Hx:   History   Social History  . Marital Status: Married    Spouse Name: N/A    Number of Children: N/A  . Years of Education: N/A   Occupational History  . Not on file.   Social History Main Topics  . Smoking status: Former Smoker    Quit date: 06/12/2008  . Smokeless tobacco: Not on file  . Alcohol Use: 0.0 oz/week     4-6 cans beer/ night  . Drug Use: No  . Sexually Active:  Not Currently   Other Topics Concern  . Not on file   Social History Narrative  . No narrative on file    Past Surgical Hx:  Past Surgical History  Procedure Date  . Pacemaker insertion 2012    Had replaced in July 2012, first placed in 1996  . Knee surgery   . Total abdominal hysterectomy w/ bilateral salpingoophorectomy 07/2008    Ovarian cancer  . Insert / replace / remove pacemaker   . Iliac artery stent Apr 05, 2010    Left artery stenting  . Angioplasty / stenting iliac     Past Medical Hx:  Past Medical History  Diagnosis Date  . Hyperlipidemia   . Hypothyroid   . Complete AV block     s/p pacemaker insertion 1996  . Cardiomyopathy     tachycardia induced. EF 35-40% 06/2008  . Ovarian cancer   . Peripheral artery disease   . Hypertension   . Abdominal pain     Intermittent pain with firmness, ache, more at night, pain in different quadrants each time  . Peripheral vascular disease     Family Hx:  Family History  Problem Relation Age of Onset  . Cancer Father     died of lung cancer  . Lung cancer Father   . Diabetes Brother     Vitals:  Blood pressure 106/50, pulse 62, temperature 97.9 F (36.6 C), temperature source Oral, resp. rate 16, height 5' 5.35" (1.66 m), weight 199 lb 12.8 oz (90.629 kg).  Physical Exam: Neck  Supple without any enlargements.  Cardiovascular  Pulse normal rate, regularity and rhythm.   Lungs  Clear to auscultation bilateraly, without wheezes/crackles/rhonchi. Good air movement.  Psychiatry  Alert and oriented to person, place, and time  Abdomen  Normoactive bowel sounds, abdomen soft, non-tender and obese. Surgical  sites intact without evidence of hernia.  Genito Urinary  Vulva/vagina: Normal external female genitalia.  No lesions. No discharge or bleeding.  Bladder/urethra:  No lesions or masses  Adnexa: No palpable masses, No nodularity. Rectal  Good tone, no masses no cul de sac nodularity.  Extremities  No  bilateral cyanosis, clubbing or edema. No rash, lesions or petiche.    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 I have not seen her in almost 2 years. We will get a CA 125 from today. If is is normal and we would follow her expectantly. However, if her CA 125 is elevated we'll need to proceed with imaging. Alternatively if her abdominal symptoms continue to worsen she'll need to pursue imaging. At this  point she'll return to see [place] in 1 year and will followup with Gabriel Cirri, her primary physician in 6 months   Glady Ouderkirk A., MD 02/19/2011, 2:33 PM

## 2011-02-19 NOTE — Patient Instructions (Signed)
Please return to see GYN oncology in one year and followup with Gabriel Cirri as scheduled

## 2011-02-28 ENCOUNTER — Encounter: Payer: Self-pay | Admitting: Internal Medicine

## 2011-02-28 ENCOUNTER — Ambulatory Visit (INDEPENDENT_AMBULATORY_CARE_PROVIDER_SITE_OTHER): Payer: BC Managed Care – PPO | Admitting: Internal Medicine

## 2011-02-28 DIAGNOSIS — I442 Atrioventricular block, complete: Secondary | ICD-10-CM

## 2011-02-28 DIAGNOSIS — Z950 Presence of cardiac pacemaker: Secondary | ICD-10-CM

## 2011-02-28 DIAGNOSIS — I429 Cardiomyopathy, unspecified: Secondary | ICD-10-CM

## 2011-02-28 LAB — PACEMAKER DEVICE OBSERVATION
AL IMPEDENCE PM: 450 Ohm
ATRIAL PACING PM: 46
BAMS-0001: 180 {beats}/min
BAMS-0003: 70 {beats}/min
DEVICE MODEL PM: 7251318
RV LEAD THRESHOLD: 1.125 V

## 2011-02-28 NOTE — Assessment & Plan Note (Signed)
Her device is working normally. Will recheck in several months. 

## 2011-02-28 NOTE — Assessment & Plan Note (Signed)
I suspect her symptoms are related to this. She will continue her current meds and maintain a low sodium diet.

## 2011-02-28 NOTE — Patient Instructions (Addendum)
Your physician recommends that you schedule a follow-up appointment in: 6 months  

## 2011-02-28 NOTE — Progress Notes (Signed)
HPI [name] [name] returns today for followup. She is a pleasant 55 yo woman with a h/o symptomatic bradycardia, s/p PPM, non-ischemic CM, HTN and dyslipidemia. She denies c/p or sob. She has occaisionally feels lightheaded. No edema. Allergies  Allergen Reactions  . Contrast Media (Iodinated Diagnostic Agents)     unknown  . Latex     rash  . Nickel     rish     Current Outpatient Prescriptions  Medication Sig Dispense Refill  . aspirin 81 MG tablet Take 81 mg by mouth daily.        . benazepril (LOTENSIN) 10 MG tablet Take 10 mg by mouth daily.        . calcium carbonate (TUMS EX) 750 MG chewable tablet Chew 2 tablets by mouth daily.       . clonazePAM (KLONOPIN) 0.5 MG tablet Take 0.5 mg by mouth as needed.        Marland Kitchen levothyroxine (SYNTHROID, LEVOTHROID) 100 MCG tablet Take 100 mcg by mouth daily.        Marland Kitchen lovastatin (MEVACOR) 20 MG tablet Take 20 mg by mouth at bedtime.        . metoprolol (LOPRESSOR) 50 MG tablet Take 75 mg by mouth 2 (two) times daily.           Past Medical History  Diagnosis Date  . Hyperlipidemia   . Hypothyroid   . Complete AV block     s/p pacemaker insertion 1996  . Cardiomyopathy     tachycardia induced. EF 35-40% 06/2008  . Ovarian cancer   . Peripheral artery disease   . Hypertension   . Abdominal pain     Intermittent pain with firmness, ache, more at night, pain in different quadrants each time  . Peripheral vascular disease     ROS:   All systems reviewed and negative except as noted in the HPI.   Past Surgical History  Procedure Date  . Pacemaker insertion 2012    Had replaced in July 2012, first placed in 1996  . Knee surgery   . Total abdominal hysterectomy w/ bilateral salpingoophorectomy 07/2008    Ovarian cancer  . Insert / replace / remove pacemaker   . Iliac artery stent Apr 05, 2010    Left artery stenting  . Angioplasty / stenting iliac      Family History  Problem Relation Age of Onset  . Cancer Father     died of  lung cancer  . Lung cancer Father   . Diabetes Brother      History   Social History  . Marital Status: Married    Spouse Name: N/A    Number of Children: N/A  . Years of Education: N/A   Occupational History  . Not on file.   Social History Main Topics  . Smoking status: Former Smoker    Quit date: 06/12/2008  . Smokeless tobacco: Not on file  . Alcohol Use: 0.0 oz/week     4-6 cans beer/ night  . Drug Use: No  . Sexually Active: Not Currently   Other Topics Concern  . Not on file   Social History Narrative  . No narrative on file     BP 100/62  Pulse 65  Ht 5\' 5"  (1.651 m)  Wt 91.627 kg (202 lb)  BMI 33.61 kg/m2  Physical Exam:  Well appearing middle aged woman,  NAD HEENT: Unremarkable Neck:  No JVD, no thyromegally Lymphatics:  No adenopathy Back:  No CVA  tenderness Lungs:  Clear with no wheezes. Or rhonchi. Well healed PPM incision. HEART:  Regular rate rhythm, no murmurs, no rubs, no clicks Abd:  soft, positive bowel sounds, no organomegally, no rebound, no guarding Ext:  2 plus pulses, no edema, no cyanosis, no clubbing Skin:  No rashes no nodules Neuro:  CN II through XII intact, motor grossly intact  EKG NSR  DEVICE  Normal device function.  See PaceArt for details.   Assess/Plan:

## 2011-04-11 ENCOUNTER — Encounter: Payer: Self-pay | Admitting: Internal Medicine

## 2011-05-12 ENCOUNTER — Telehealth: Payer: Self-pay | Admitting: Cardiovascular Disease

## 2011-05-12 NOTE — Telephone Encounter (Signed)
New problem Pt wanted to talk you wouldn't tell my why please call

## 2011-05-12 NOTE — Telephone Encounter (Signed)
I spoke with the pt and she is concerned because of the cost of her medical bills. The pt said she is paying on 4 bills that total $4000.  The pt has tried to get financial assistance (April/May 2012) but was denied.  She currently owes office bills for Dr Excell Seltzer $517.15 and Dr Ladona Ridgel $980.93.  The other two bills are for the hospital.  The pt is paying about $200 every month and barely making ends meet.  Her husband is now having health issues and is having to pay his bills. The pt is wondering if there is any way to have the physicians waive some of their fees.  The pt is also wondering if there is any type of financial assistance that she can apply for at this time.  I will follow-up with the pt after speaking with staff in the office.

## 2011-05-22 NOTE — Telephone Encounter (Signed)
I have attempted to reach the pt at home number but she does not have an answering machine.  I spoke with Angelique Blonder and she said the pt needs to reapply for assistance program.  The pt should also contact [employer] at [phone] to discuss her concerns about paying bills.  I will mail a packet to the patient with form to reapply for financial assistance.

## 2011-10-13 ENCOUNTER — Ambulatory Visit: Payer: Self-pay

## 2012-03-01 ENCOUNTER — Telehealth: Payer: Self-pay | Admitting: Internal Medicine

## 2012-03-01 NOTE — Telephone Encounter (Signed)
03-01-12 lmm at 420pm for pt to set up November pacer ck in [place]/mt

## 2012-06-04 ENCOUNTER — Ambulatory Visit: Payer: Self-pay

## 2012-06-08 ENCOUNTER — Encounter: Payer: Self-pay | Admitting: *Deleted

## 2012-09-16 ENCOUNTER — Telehealth: Payer: Self-pay | Admitting: Internal Medicine

## 2012-09-16 NOTE — Telephone Encounter (Signed)
09-16-12 lmm @ 426pm for pt to set up past due pacer check with taylor in [place]/mt

## 2012-10-13 ENCOUNTER — Ambulatory Visit: Payer: Self-pay

## 2012-10-28 ENCOUNTER — Telehealth: Payer: Self-pay | Admitting: Internal Medicine

## 2012-10-28 NOTE — Telephone Encounter (Signed)
10-28-12 no response from msg 09-16-12, left another message @ 325pm/mt

## 2012-11-15 ENCOUNTER — Telehealth: Payer: Self-pay | Admitting: Internal Medicine

## 2012-11-15 ENCOUNTER — Encounter: Payer: Self-pay | Admitting: Internal Medicine

## 2012-11-15 NOTE — Telephone Encounter (Signed)
09-16-12 lmm @ 426pm/mt 10-28-12 lmm @ 325pm/mt 11-15-12 sent certified letter/mt

## 2012-11-24 ENCOUNTER — Telehealth: Payer: Self-pay | Admitting: *Deleted

## 2012-11-24 NOTE — Telephone Encounter (Signed)
Patient wanting to ask some questions regarding her pacemaker plus some question on billing. I referred her to the billing dept. She has specifically asked for you.

## 2012-11-25 NOTE — Telephone Encounter (Signed)
Patient scheduled for in office check 11/29/12.

## 2012-11-29 ENCOUNTER — Encounter: Payer: Self-pay | Admitting: Internal Medicine

## 2012-11-29 ENCOUNTER — Ambulatory Visit (INDEPENDENT_AMBULATORY_CARE_PROVIDER_SITE_OTHER): Payer: BC Managed Care – PPO | Admitting: *Deleted

## 2012-11-29 DIAGNOSIS — I442 Atrioventricular block, complete: Secondary | ICD-10-CM

## 2012-11-29 LAB — PACEMAKER DEVICE OBSERVATION
AL AMPLITUDE: 2.6 mv
AL THRESHOLD: 1.5 V
BAMS-0001: 180 {beats}/min
DEVICE MODEL PM: 7251318
RV LEAD IMPEDENCE PM: 850 Ohm
RV LEAD THRESHOLD: 1.125 V

## 2012-11-29 NOTE — Progress Notes (Signed)
PPM check in office. 

## 2013-09-16 ENCOUNTER — Telehealth: Payer: Self-pay

## 2013-09-16 NOTE — Telephone Encounter (Signed)
Patient had allergic reaction  She might have to have an epi pen  She wants to make sure this is ok with her pacemaker

## 2013-09-16 NOTE — Telephone Encounter (Signed)
Pt called and asks if she can use an Epi Pen with her pace maker. please call and advise.

## 2013-09-21 NOTE — Telephone Encounter (Signed)
Informed patient per verbal from Dr. Caryl Comes  That she can use an Epi pen if needed  Patient verbalized understanding

## 2013-10-18 ENCOUNTER — Ambulatory Visit (INDEPENDENT_AMBULATORY_CARE_PROVIDER_SITE_OTHER): Payer: BC Managed Care – PPO | Admitting: Internal Medicine

## 2013-10-18 ENCOUNTER — Ambulatory Visit: Payer: Self-pay

## 2013-10-18 ENCOUNTER — Encounter: Payer: Self-pay | Admitting: Internal Medicine

## 2013-10-18 VITALS — BP 118/84 | HR 63 | Ht 65.5 in | Wt 214.8 lb

## 2013-10-18 DIAGNOSIS — I442 Atrioventricular block, complete: Secondary | ICD-10-CM

## 2013-10-18 DIAGNOSIS — I428 Other cardiomyopathies: Secondary | ICD-10-CM

## 2013-10-18 DIAGNOSIS — I429 Cardiomyopathy, unspecified: Secondary | ICD-10-CM

## 2013-10-18 DIAGNOSIS — Z950 Presence of cardiac pacemaker: Secondary | ICD-10-CM

## 2013-10-18 LAB — MDC_IDC_ENUM_SESS_TYPE_INCLINIC
Battery Remaining Longevity: 92 mo
Brady Statistic RA Percent Paced: 53 %
Brady Statistic RV Percent Paced: 100 %
Lead Channel Impedance Value: 440 Ohm
Lead Channel Pacing Threshold Amplitude: 0.75 V
Lead Channel Sensing Intrinsic Amplitude: 2.3 mV
Lead Channel Setting Pacing Amplitude: 2.5 V
Lead Channel Setting Pacing Pulse Width: 0.5 ms
MDC IDC MSMT LEADCHNL RA PACING THRESHOLD AMPLITUDE: 1.5 V
MDC IDC MSMT LEADCHNL RA PACING THRESHOLD PULSEWIDTH: 0.5 ms
MDC IDC MSMT LEADCHNL RV IMPEDANCE VALUE: 800 Ohm
MDC IDC MSMT LEADCHNL RV PACING THRESHOLD PULSEWIDTH: 0.5 ms
MDC IDC PG SERIAL: 7251318
MDC IDC SET LEADCHNL RV PACING AMPLITUDE: 1.375
MDC IDC SET LEADCHNL RV SENSING SENSITIVITY: 4 mV

## 2013-10-18 NOTE — Patient Instructions (Signed)
Your physician has requested that you have an echocardiogram. Echocardiography is a painless test that uses sound waves to create images of your heart. It provides your doctor with information about the size and shape of your heart and how well your heart's chambers and valves are working. This procedure takes approximately one hour. There are no restrictions for this procedure.   Your physician wants you to follow-up in: 6 months with [facility]. You will receive a reminder letter in the mail two months in advance. If you don't receive a letter, please call our office to schedule the follow-up appointment.  Your physician wants you to follow-up in: 1 year with Dr. Caryl Comes. You will receive a reminder letter in the mail two months in advance. If you don't receive a letter, please call our office to schedule the follow-up appointment.

## 2013-10-18 NOTE — Progress Notes (Signed)
Patient Care Team: Golden Pop, MD as PCP - General   HPI  [name] [name] is a 58 y.o. female Seen to reestablish pacemaker followup. 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 This was initially implanted in 1998 She was last seen 2012. She has a history of a nonischemic cardiomyopathy although she has significant peripheral vascular disease  Ejection fraction by echo 2010 40-45% with mild left atrial enlargement  1 she has mild peripheral edema and some nocturnal dyspnea. Past Medical History  Diagnosis Date  . Hyperlipidemia   . Hypothyroid   . Complete AV block     s/p pacemaker insertion 1996  . Cardiomyopathy     tachycardia induced. EF 35-40% 06/2008  . Ovarian cancer   . Peripheral artery disease   . Hypertension   . Abdominal pain     Intermittent pain with firmness, ache, more at night, pain in different quadrants each time  . Peripheral vascular disease     Past Surgical History  Procedure Laterality Date  . Pacemaker insertion  2012    Had replaced in July 2012, first placed in 1996  . Knee surgery    . Total abdominal hysterectomy w/ bilateral salpingoophorectomy  07/2008    Ovarian cancer  . Insert / replace / remove pacemaker    . Iliac artery stent  Apr 05, 2010    Left artery stenting  . Angioplasty / stenting iliac      Current Outpatient Prescriptions  Medication Sig Dispense Refill  . aspirin 81 MG tablet Take 81 mg by mouth daily.        . benazepril (LOTENSIN) 10 MG tablet Take 10 mg by mouth daily.        . calcium carbonate (TUMS EX) 750 MG chewable tablet Chew 2 tablets by mouth daily.       . clonazePAM (KLONOPIN) 0.5 MG tablet Take 0.5 mg by mouth as needed.        Marland Kitchen levothyroxine (SYNTHROID, LEVOTHROID) 100 MCG tablet Take 100 mcg by mouth daily.        Marland Kitchen lovastatin (MEVACOR) 20 MG tablet Take 20 mg by mouth at bedtime.        . meloxicam (MOBIC) 15 MG tablet Take 15 mg by mouth daily.      .  metoprolol (LOPRESSOR) 50 MG tablet Take 75 mg by mouth 2 (two) times daily.         No current facility-administered medications for this visit.    Allergies  Allergen Reactions  . Contrast Media [Iodinated Diagnostic Agents]     unknown  . Latex     rash  . Meat Extract   . Nickel     rish    Review of Systems negative except from HPI and PMH  Physical Exam BP 118/84  Pulse 63  Ht 5' 5.5" (1.664 m)  Wt 214 lb 12 oz (97.41 kg)  BMI 35.18 kg/m2 Well developed and well nourished in no acute distress HENT normal E scleral and icterus clear Neck Supple JVP flat; carotids brisk and full Clear to ausculation  Regular rate and rhythm, no murmurs gallops or rub Soft with active bowel sounds No clubbing cyanosis  Edema Alert and oriented, grossly normal motor and sensory function Skin Warm and Dry  ECG demonstrates P. Synchronous pacing  Assessment and  Plan  Complete heart block-congenital  Pacemaker-St. Jude  Cardiomyopathy  Peripheral vascular disease status post iliac stenting  The  patient has complete congenital heart block and is status post pacemaker implantation. Previous assessment 5 years ago LV function demonstrated EF of 40%. Reassessment is indicated.  She is modest symptoms of exercise intolerance; however, mostly she is limited by her arthritis  Right now we'll continue her beta blockers and/or ARB.  Device function is normal.

## 2013-11-01 ENCOUNTER — Other Ambulatory Visit: Payer: Self-pay

## 2013-11-01 ENCOUNTER — Other Ambulatory Visit (INDEPENDENT_AMBULATORY_CARE_PROVIDER_SITE_OTHER): Payer: BC Managed Care – PPO

## 2013-11-01 DIAGNOSIS — I428 Other cardiomyopathies: Secondary | ICD-10-CM

## 2013-11-01 DIAGNOSIS — I429 Cardiomyopathy, unspecified: Secondary | ICD-10-CM

## 2013-11-09 ENCOUNTER — Ambulatory Visit: Payer: Self-pay | Admitting: Gastroenterology

## 2013-11-11 ENCOUNTER — Ambulatory Visit: Payer: Self-pay | Admitting: Gastroenterology

## 2013-11-16 ENCOUNTER — Ambulatory Visit: Payer: Self-pay | Admitting: Gastroenterology

## 2014-01-09 ENCOUNTER — Telehealth: Payer: Self-pay | Admitting: *Deleted

## 2014-01-09 NOTE — Telephone Encounter (Signed)
Please call patient. She has to have hernia surgery and has a pacemaker and has several questions.

## 2014-01-09 NOTE — Telephone Encounter (Signed)
LVM 9/28

## 2014-01-10 NOTE — Telephone Encounter (Signed)
Patient stated she is having hernia surgery  She needs cardiac clearance   She also wants to know when her pacer leads will need to be replaced  Dr. Caryl Comes told her in [place] that her pacer leads are getting and they might need to be replaced

## 2014-01-17 ENCOUNTER — Telehealth: Payer: Self-pay

## 2014-01-17 NOTE — Telephone Encounter (Signed)
Pt states she called last week, and hasnt heard anything will be back after 3. And leaves work at [employer].

## 2014-01-17 NOTE — Telephone Encounter (Signed)
Attempted to recall patient no answer

## 2014-01-18 NOTE — Telephone Encounter (Signed)
Message sent to [facility] 01/18/14

## 2014-01-18 NOTE — Telephone Encounter (Signed)
I dont see issues regarding pacemaker leads  Cardiac clearance> hve form sent ot office and will fill out next week

## 2014-01-18 NOTE — Telephone Encounter (Signed)
LVM 10/7

## 2014-01-19 NOTE — Telephone Encounter (Signed)
Informed patient of Dr. Aquilla Hacker response (see phone note from 01/18/14)  Patient verbalized understanding

## 2014-04-28 ENCOUNTER — Ambulatory Visit (INDEPENDENT_AMBULATORY_CARE_PROVIDER_SITE_OTHER): Payer: BLUE CROSS/BLUE SHIELD | Admitting: *Deleted

## 2014-04-28 DIAGNOSIS — I442 Atrioventricular block, complete: Secondary | ICD-10-CM

## 2014-04-28 LAB — MDC_IDC_ENUM_SESS_TYPE_INCLINIC
Battery Remaining Longevity: 102 mo
Battery Voltage: 2.95 V
Brady Statistic RA Percent Paced: 59 %
Date Time Interrogation Session: 20160115154919
Lead Channel Impedance Value: 1125 Ohm
Lead Channel Pacing Threshold Amplitude: 1.25 V
Lead Channel Pacing Threshold Amplitude: 1.25 V
Lead Channel Pacing Threshold Pulse Width: 0.5 ms
Lead Channel Pacing Threshold Pulse Width: 0.5 ms
Lead Channel Setting Pacing Pulse Width: 0.5 ms
MDC IDC MSMT LEADCHNL RA IMPEDANCE VALUE: 475 Ohm
MDC IDC MSMT LEADCHNL RA PACING THRESHOLD AMPLITUDE: 1.25 V
MDC IDC MSMT LEADCHNL RA PACING THRESHOLD PULSEWIDTH: 0.5 ms
MDC IDC MSMT LEADCHNL RA SENSING INTR AMPL: 5 mV
MDC IDC PG SERIAL: 7251318
MDC IDC SET LEADCHNL RA PACING AMPLITUDE: 2.5 V
MDC IDC SET LEADCHNL RV PACING AMPLITUDE: 1.5 V
MDC IDC SET LEADCHNL RV SENSING SENSITIVITY: 4 mV
MDC IDC STAT BRADY RV PERCENT PACED: 99.84 %

## 2014-04-28 NOTE — Progress Notes (Signed)
PPM check in office. 

## 2014-05-03 ENCOUNTER — Encounter: Payer: Self-pay | Admitting: Internal Medicine

## 2014-06-21 ENCOUNTER — Telehealth: Payer: Self-pay | Admitting: *Deleted

## 2014-06-21 NOTE — Telephone Encounter (Signed)
Request for surgical clearance:  1. What type of surgery is being performed? Hernia   2. When is this surgery scheduled? April 13  3. Are there any medications that need to be held prior to surgery and how long? Not sure, pre op is April 4th  4. Name of physician performing surgery? Dr. Nelida Meuse [facility]   5. What is your office phone and fax number? office number [phone] fax: [phone] 6.

## 2014-06-27 NOTE — Telephone Encounter (Signed)
Should be at acceptable risk given her functional status

## 2014-06-29 ENCOUNTER — Encounter: Payer: Self-pay | Admitting: *Deleted

## 2014-06-29 ENCOUNTER — Telehealth: Payer: Self-pay | Admitting: *Deleted

## 2014-06-29 NOTE — Telephone Encounter (Signed)
error 

## 2014-06-29 NOTE — Telephone Encounter (Signed)
Dr. Caryl Comes please let me know if she is low mod or high risk and if she needs to hold her aspirin

## 2014-06-29 NOTE — Telephone Encounter (Signed)
Per Dr. Caryl Comes:  Low risk  Ok to hold ASA

## 2014-07-03 ENCOUNTER — Encounter: Payer: Self-pay | Admitting: *Deleted

## 2014-07-03 ENCOUNTER — Telehealth: Payer: Self-pay | Admitting: *Deleted

## 2014-07-03 NOTE — Telephone Encounter (Signed)
Clearance faxed as requested

## 2014-07-06 ENCOUNTER — Telehealth: Payer: Self-pay | Admitting: *Deleted

## 2014-07-06 NOTE — Telephone Encounter (Signed)
Pt states she has been having headaches more often, is not sure if it is heart related. She is concerned about this.  Please advise.

## 2014-07-07 NOTE — Telephone Encounter (Signed)
Patient stated she has been having headaches off and on lately  She stated they are more frequent than usual  She says her blood pressure is staying in the "low normal range" She denies any other symptoms  She wanted to make sure Dr. Caryl Comes did not think this was a cardiac issue   I informed patient that I will inform Dr. Caryl Comes  Instructed patient to follow up with her PCP regarding her headaches  Patient verbalized understanding

## 2014-09-26 ENCOUNTER — Telehealth: Payer: Self-pay | Admitting: Unknown Physician Specialty

## 2014-09-26 MED ORDER — LOVASTATIN 20 MG PO TABS: 20.0000 mg | ORAL_TABLET | Freq: Every day | ORAL | Status: DC

## 2014-09-26 NOTE — Telephone Encounter (Signed)
E-Fax came through for refill on: Rx: Lovastatin 20 mg Pharmacy: [facility]

## 2014-10-17 ENCOUNTER — Ambulatory Visit (INDEPENDENT_AMBULATORY_CARE_PROVIDER_SITE_OTHER): Payer: BLUE CROSS/BLUE SHIELD | Admitting: Internal Medicine

## 2014-10-17 ENCOUNTER — Encounter: Payer: Self-pay | Admitting: Internal Medicine

## 2014-10-17 VITALS — BP 104/68 | HR 61 | Ht 65.5 in | Wt 189.0 lb

## 2014-10-17 DIAGNOSIS — I429 Cardiomyopathy, unspecified: Secondary | ICD-10-CM | POA: Diagnosis not present

## 2014-10-17 DIAGNOSIS — T82110A Breakdown (mechanical) of cardiac electrode, initial encounter: Secondary | ICD-10-CM

## 2014-10-17 DIAGNOSIS — Z01818 Encounter for other preprocedural examination: Secondary | ICD-10-CM | POA: Diagnosis not present

## 2014-10-17 DIAGNOSIS — Z950 Presence of cardiac pacemaker: Secondary | ICD-10-CM | POA: Diagnosis not present

## 2014-10-17 DIAGNOSIS — T82190A Other mechanical complication of cardiac electrode, initial encounter: Secondary | ICD-10-CM

## 2014-10-17 DIAGNOSIS — I442 Atrioventricular block, complete: Secondary | ICD-10-CM | POA: Diagnosis not present

## 2014-10-17 HISTORY — DX: Breakdown (mechanical) of cardiac electrode, initial encounter: T82.110A

## 2014-10-17 LAB — CUP PACEART INCLINIC DEVICE CHECK
Battery Voltage: 2.93 V
Brady Statistic RA Percent Paced: 46 %
Brady Statistic RV Percent Paced: 99.76 %
Lead Channel Impedance Value: 937.5 Ohm
Lead Channel Pacing Threshold Amplitude: 1.25 V
Lead Channel Pacing Threshold Amplitude: 1.25 V
Lead Channel Pacing Threshold Pulse Width: 0.5 ms
Lead Channel Pacing Threshold Pulse Width: 0.5 ms
Lead Channel Setting Pacing Pulse Width: 0.5 ms
MDC IDC MSMT BATTERY REMAINING LONGEVITY: 94.8 mo
MDC IDC MSMT LEADCHNL RA IMPEDANCE VALUE: 450 Ohm
MDC IDC MSMT LEADCHNL RA PACING THRESHOLD PULSEWIDTH: 0.5 ms
MDC IDC MSMT LEADCHNL RA SENSING INTR AMPL: 2.7 mV
MDC IDC MSMT LEADCHNL RV PACING THRESHOLD AMPLITUDE: 1.5 V
MDC IDC MSMT LEADCHNL RV SENSING INTR AMPL: 12 mV
MDC IDC SESS DTM: 20160705104149
MDC IDC SET LEADCHNL RA PACING AMPLITUDE: 2.5 V
MDC IDC SET LEADCHNL RV PACING AMPLITUDE: 1.75 V
MDC IDC SET LEADCHNL RV SENSING SENSITIVITY: 4 mV
Pulse Gen Serial Number: 7251318

## 2014-10-17 NOTE — Progress Notes (Signed)
Patient Care Team: Guadalupe Maple, MD as PCP - General   HPI  [name] [name] is a 59 y.o. female Seen to reestablish pacemaker followup. 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 This was initially implanted in 1998   She has a history of a nonischemic cardiomyopathy although she has significant peripheral vascular disease with remote revascularization.  Ejection fraction by echo 2010 40-45% with mild left atrial enlargement.  Repeat echocardiogram 7/15 showed no significant interval change 40--45%;  she has not had interval evaluation of coronary perfusion many years  She has anticipated hip replacement surgery. Earlier this year she underwent repair of her hernia related to her prior abdominal surgery   She has had no lightheadedness or syncope.  Past Medical History  Diagnosis Date  . Hyperlipidemia   . Hypothyroid   . Complete AV block     s/p pacemaker insertion 1996  . Cardiomyopathy     tachycardia induced. EF 35-40% 06/2008  . Ovarian cancer   . Peripheral artery disease   . Hypertension   . Abdominal pain     Intermittent pain with firmness, ache, more at night, pain in different quadrants each time  . Peripheral vascular disease   . Pacemaker lead failure--noise on atrial greater than ventricular lead with ventricular backup pulse pacing 10/17/2014    Past Surgical History  Procedure Laterality Date  . Pacemaker insertion  2012    Had replaced in July 2012, first placed in 1996  . Knee surgery    . Total abdominal hysterectomy w/ bilateral salpingoophorectomy  07/2008    Ovarian cancer  . Insert / replace / remove pacemaker    . Iliac artery stent  Apr 05, 2010    Left artery stenting  . Angioplasty / stenting iliac      Current Outpatient Prescriptions  Medication Sig Dispense Refill  . aspirin 81 MG tablet Take 81 mg by mouth daily.      . benazepril (LOTENSIN) 10 MG tablet Take 10 mg by mouth daily.      . clonazePAM  (KLONOPIN) 0.5 MG tablet Take 0.5 mg by mouth as needed.      Marland Kitchen levothyroxine (SYNTHROID, LEVOTHROID) 100 MCG tablet Take 100 mcg by mouth daily.      Marland Kitchen lovastatin (MEVACOR) 20 MG tablet Take 1 tablet (20 mg total) by mouth at bedtime. 30 tablet 3  . meloxicam (MOBIC) 15 MG tablet Take 15 mg by mouth daily.    . metoprolol (LOPRESSOR) 50 MG tablet Take 75 mg by mouth 2 (two) times daily.       No current facility-administered medications for this visit.    Allergies  Allergen Reactions  . Contrast Media [Iodinated Diagnostic Agents]     unknown  . Latex     rash  . Meat Extract   . Nickel     rish    Review of Systems negative except from HPI and PMH  Physical Exam BP 104/68 mmHg  Pulse 61  Ht 5' 5.5" (1.664 m)  Wt 189 lb (85.73 kg)  BMI 30.96 kg/m2 Well developed and well nourished in no acute distress HENT normal E scleral and icterus clear Neck Supple JVP flat; carotids brisk and full Clear to ausculation  Regular rate and rhythm, no murmurs gallops or rub Soft with active bowel sounds No clubbing cyanosis  Edema Alert and oriented, grossly normal motor and sensory function Skin Warm and Dry  ECG demonstrates  P. Synchronous pacing  Assessment and  Plan  Complete heart block-congenital  Pacemaker-St. Jude  Cardiomyopathy  Peripheral vascular disease status post iliac stenting  Pacemaker lead revision  The patient has complete congenital heart block and is status post pacemaker implantation. Previous assessment 5 years ago LV function demonstrated EF of 40%. Reassessment is indicated.  The patient has hip surgery anticipated; given the limitations of activity and the fact that there has been no interval assessment of coronary perfusion and she has known peripheral vascular disease, will undertake a C Myoview.  There also issues related to lead noise. This is seen on the atrial lead and the ventricular lead. It raises the concerns as to whether this is device  issue. She has been clinically stable so I think it is reasonable for undertake her hip surgery without detour for device However, I do think we will need to consider revision of her pacemaker system given that she is device dependent. I will review this with Dr. Elliot Cousin. We have discussed the potential for lead extraction.  There is no superficial collateralization and so new lead placement is also possible.  Received verbal permission to be accompanied by Sandford Craze, medical student

## 2014-10-17 NOTE — Patient Instructions (Addendum)
Medication Instructions:  Your physician recommends that you continue on your current medications as directed. Please refer to the Current Medication list given to you today.   Labwork: none  Testing/Procedures: Your physician has requested that you have a lexiscan myoview.  [place]  Your caregiver has ordered a Stress Test with nuclear imaging. The purpose of this test is to evaluate the blood supply to your heart muscle. This procedure is referred to as a "Non-Invasive Stress Test." This is because other than having an IV started in your vein, nothing is inserted or "invades" your body. Cardiac stress tests are done to find areas of poor blood flow to the heart by determining the extent of coronary artery disease (CAD). Some patients exercise on a treadmill, which naturally increases the blood flow to your heart, while others who are  unable to walk on a treadmill due to physical limitations have a pharmacologic/chemical stress agent called Lexiscan . This medicine will mimic walking on a treadmill by temporarily increasing your coronary blood flow.   Please note: these test may take anywhere between 2-4 hours to complete  PLEASE REPORT TO [place] WILL DIRECT YOU WHERE TO GO  Date of Procedure: Friday, July 8, 8:30am Arrival Time for Procedure: 8:00am  Instructions regarding medication:     __xx__:  Hold betablocker(s) night before procedure and morning of procedure. Do not take metoprolol night before or day of procedure.   _ PLEASE NOTIFY THE OFFICE AT LEAST 41 HOURS IN ADVANCE IF YOU ARE UNABLE TO KEEP YOUR APPOINTMENT.  [phone] AND  PLEASE NOTIFY NUCLEAR MEDICINE AT [facility] AT LEAST 24 HOURS IN ADVANCE IF YOU ARE UNABLE TO KEEP YOUR APPOINTMENT. [phone]  How to prepare for your Myoview test:   Do not eat or drink after midnight  No caffeine for 24 hours prior to test  No smoking 24 hours prior to  test.  Your medication may be taken with water.  If your doctor stopped a medication because of this test, do not take that medication.  Ladies, please do not wear dresses.  Skirts or pants are appropriate. Please wear a short sleeve shirt.  No perfume, cologne or lotion.  Wear comfortable walking shoes. No heels!            Follow-Up: Your physician wants you to follow-up in: one year with Dr. Caryl Comes.  You will receive a reminder letter in the mail two months in advance. If you don't receive a letter, please call our office to schedule the follow-up appointment. Your physician wants you to follow-up in: six months with device clinic.  You will receive a reminder letter in the mail two months in advance. If you don't receive a letter, please call our office to schedule the follow-up appointment.    Any Other Special Instructions Will Be Listed Below (If Applicable).  Nuclear Medicine Exam A nuclear medicine exam is a safe and painless imaging test. It helps to detect and diagnose disease in the body as well as provide information about organ function and structure.  Nuclear scans are most often done of the:  Lungs.  Heart.  Thyroid gland.  Bones.  Abdomen. HOW A NUCLEAR MEDICINE EXAM WORKS A nuclear medicine exam works by using a radioactive tracer. The material is given either by an IV (intravenous) injection or it may be swallowed. After the tracer is in the body, it is absorbed by your body's organs. A large  scanning machine that uses a special camera detects the radioactivity in your body. A computerized image is then formed regarding the area of concern. The small amounts of radioactive material used in a nuclear medicine exam are found to be medically safe. However, because radioactive material is used, this test is not done if you are pregnant or nursing.  BEFORE THE PROCEDURE  If available, bring previous imaging studies such as x-rays, etc. with you to the  exam.  Arrive early for your exam. PROCEDURE  An IV may be started before the exam begins.  Depending on the type of examination, will lie on a table or sit in a chair during the exam.  The nuclear medicine exam will take about 30 to 60 minutes to complete. AFTER THE PROCEDURE  After your scan is completed, the image(s) will be evaluated by a specialist. It is important that you follow up with your caregiver to find out your test results.  You may return to your regular activity as instructed by your caregiver. SEEK IMMEDIATE MEDICAL CARE IF: You have shortness of breath or difficulty breathing. MAKE SURE YOU:   Understand these instructions.  Will watch your condition.  Will get help right away if you are not doing well or get worse. Document Released: 05/08/2004 Document Revised: 06/23/2011 Document Reviewed: 06/22/2008 [facility] Patient Information 2015 [place], [place]. This information is not intended to replace advice given to you by your health care provider. Make sure you discuss any questions you have with your health care provider.

## 2014-10-18 ENCOUNTER — Encounter
Admission: RE | Admit: 2014-10-18 | Discharge: 2014-10-18 | Disposition: A | Discharge status: Home / Self Care | Payer: BLUE CROSS/BLUE SHIELD | Source: Ambulatory Visit | Attending: Orthopedic Surgery | Admitting: Orthopedic Surgery

## 2014-10-18 DIAGNOSIS — Z01818 Encounter for other preprocedural examination: Secondary | ICD-10-CM | POA: Diagnosis not present

## 2014-10-18 LAB — SEDIMENTATION RATE: Sed Rate: 12 mm/hr (ref 0–30)

## 2014-10-18 LAB — BASIC METABOLIC PANEL
Anion gap: 9 (ref 5–15)
BUN: 8 mg/dL (ref 6–20)
CALCIUM: 9.7 mg/dL (ref 8.9–10.3)
CO2: 29 mmol/L (ref 22–32)
Chloride: 98 mmol/L — ABNORMAL LOW (ref 101–111)
Creatinine, Ser: 0.7 mg/dL (ref 0.44–1.00)
GLUCOSE: 94 mg/dL (ref 65–99)
Potassium: 4.2 mmol/L (ref 3.5–5.1)
SODIUM: 136 mmol/L (ref 135–145)

## 2014-10-18 LAB — URINALYSIS COMPLETE WITH MICROSCOPIC (ARMC ONLY)
BACTERIA UA: NONE SEEN
Bilirubin Urine: NEGATIVE
GLUCOSE, UA: NEGATIVE mg/dL
Hgb urine dipstick: NEGATIVE
Ketones, ur: NEGATIVE mg/dL
NITRITE: NEGATIVE
PROTEIN: NEGATIVE mg/dL
Specific Gravity, Urine: 1.003 — ABNORMAL LOW (ref 1.005–1.030)
pH: 6 (ref 5.0–8.0)

## 2014-10-18 LAB — PROTIME-INR
INR: 0.92
PROTHROMBIN TIME: 12.6 s (ref 11.4–15.0)

## 2014-10-18 LAB — SURGICAL PCR SCREEN
MRSA, PCR: NEGATIVE
Staphylococcus aureus: NEGATIVE

## 2014-10-18 LAB — CBC
HEMATOCRIT: 40.4 % (ref 35.0–47.0)
HEMOGLOBIN: 13.3 g/dL (ref 12.0–16.0)
MCH: 29.8 pg (ref 26.0–34.0)
MCHC: 33 g/dL (ref 32.0–36.0)
MCV: 90.4 fL (ref 80.0–100.0)
Platelets: 198 10*3/uL (ref 150–440)
RBC: 4.46 MIL/uL (ref 3.80–5.20)
RDW: 15.7 % — AB (ref 11.5–14.5)
WBC: 5.6 10*3/uL (ref 3.6–11.0)

## 2014-10-18 LAB — APTT: APTT: 28 s (ref 24–36)

## 2014-10-18 LAB — TYPE AND SCREEN
ABO/RH(D): O POS
ANTIBODY SCREEN: NEGATIVE

## 2014-10-18 NOTE — Patient Instructions (Signed)
  Your procedure is scheduled on: 10/31/14 Tues  Report to Day Surgery. To find out your arrival time please call [phone] between 1PM - 3PM on 10/30/14 Mon.  Remember: Instructions that are not followed completely may result in serious medical risk, up to and including death, or upon the discretion of your surgeon and anesthesiologist your surgery may need to be rescheduled.    _x___ 1. Do not eat food or drink liquids after midnight. No gum chewing or hard candies.     ____ 2. No Alcohol for 24 hours before or after surgery.   ____ 3. Bring all medications with you on the day of surgery if instructed.    _x___ 4. Notify your doctor if there is any change in your medical condition     (cold, fever, infections).     Do not wear jewelry, make-up, hairpins, clips or nail polish.  Do not wear lotions, powders, or perfumes. You may wear deodorant.  Do not shave 48 hours prior to surgery. Men may shave face and neck.  Do not bring valuables to the hospital.    [facility] is not responsible for any belongings or valuables.               Contacts, dentures or bridgework may not be worn into surgery.  Leave your suitcase in the car. After surgery it may be brought to your room.  For patients admitted to the hospital, discharge time is determined by your                treatment team.   Patients discharged the day of surgery will not be allowed to drive home.   Please read over the following fact sheets that you were given:   MRSA Information   _x___ Take these medicines the morning of surgery with A SIP OF WATER:    1. benazepril (LOTENSIN) 10 MG tablet  2. clonazePAM (KLONOPIN) 0.5 MG tablet  3. levothyroxine (SYNTHROID, LEVOTHROID) 100 MCG tablet  4.metoprolol (LOPRESSOR) 50 MG tablet  5.ranitidine (ZANTAC) 150 MG capsule  6.  ____ Fleet Enema (as directed)   _x___ Use CHG Soap as directed  ____ Use inhalers on the day of surgery  ____ Stop metformin 2 days prior to  surgery    ____ Take 1/2 of usual insulin dose the night before surgery and none on the morning of surgery.   _x___ Stop Coumadin/Plavix/aspirin on stop aspirin 1 week before surgery _x      Stop anti-inflammatory med Mobic 1 week before surgery   ____ Stop supplements until after surgery.    ____ Bring C-Pap to the hospital.

## 2014-10-19 ENCOUNTER — Encounter: Payer: Self-pay | Admitting: Internal Medicine

## 2014-10-20 ENCOUNTER — Ambulatory Visit
Admission: RE | Admit: 2014-10-20 | Discharge: 2014-10-20 | Disposition: A | Discharge status: Home / Self Care | Payer: BLUE CROSS/BLUE SHIELD | Source: Ambulatory Visit | Attending: Internal Medicine | Admitting: Internal Medicine

## 2014-10-20 DIAGNOSIS — Z01818 Encounter for other preprocedural examination: Secondary | ICD-10-CM | POA: Diagnosis not present

## 2014-10-20 LAB — NM MYOCAR MULTI W/SPECT W/WALL MOTION / EF
CSEPHR: 59 %
LV sys vol: 85 mL
LVDIAVOL: 144 mL
NUC STRESS TID: 0.84
Peak HR: 97 {beats}/min
Rest HR: 69 {beats}/min
SDS: 8
SRS: 12
SSS: 20

## 2014-10-20 MED ORDER — TECHNETIUM TC 99M SESTAMIBI GENERIC - CARDIOLITE
13.3200 | Freq: Once | INTRAVENOUS | Status: AC | PRN
  Administered 2014-10-20: 13.32 via INTRAVENOUS

## 2014-10-20 MED ORDER — REGADENOSON 0.4 MG/5ML IV SOLN
0.4000 mg | Freq: Once | INTRAVENOUS | Status: AC
  Administered 2014-10-20: 0.4 mg via INTRAVENOUS

## 2014-10-20 MED ORDER — TECHNETIUM TC 99M SESTAMIBI - CARDIOLITE
32.7650 | Freq: Once | INTRAVENOUS | Status: AC | PRN
  Administered 2014-10-20: 32.765 via INTRAVENOUS

## 2014-10-23 ENCOUNTER — Telehealth: Payer: Self-pay | Admitting: *Deleted

## 2014-10-23 NOTE — Telephone Encounter (Signed)
Pt calling stating she is doing a procedure on the 19th and is calling to make sure we have done all the things from our side so she can have this done.  She says she needs a clearance but she is not sure if we did this already or not.  She is concerned about her pacemaker.  Please advise.   Request for surgical clearance:  1. What type of surgery is being performed? Total hip replacement   2. When is this surgery scheduled? 10/31/14   3. Are there any medications that need to be held prior to surgery and how long?   4. Name of physician performing surgery? Dr Rudene Christians at [facility]   5. What is your office phone and fax number?   She is also calling their office so they can fax [place] over this clearance, she thought dr Lequita Halt was suppose to get this done.

## 2014-10-24 ENCOUNTER — Telehealth: Payer: Self-pay

## 2014-10-24 NOTE — Telephone Encounter (Signed)
Spoke with Dr. Caryl Comes. Patient would be acceptable risk for surgery. She should have this nuclear stress test followed up on post surgery, possibly false positive given her history.

## 2014-10-24 NOTE — Telephone Encounter (Signed)
S/w pt regarding cardiac clearance for 7/19 hip surgery. Informed pt Dr. Caryl Comes states she is acceptable risk for surgery. Pt states she has appt w/Charles, device clinic, 7/13 for pacemaker adjustments and states she has now been told she may need to postpone surgery depending on outcome tomorrow. States she will inform us of appt results. Informed pt of recommendation for follow up after surgery with Dr. Caryl Comes for abnormal myoview results.  Pt verbalized understanding with no further questions.

## 2014-10-24 NOTE — Telephone Encounter (Signed)
S/w pt regarding [name] [name]'s recommendations for follow up appt to discuss myoview results w/him or Dr. Caryl Comes post hip replacement. Pt indicates she will see [name], PA 8/16 at 2pm.  [name]'s staff message:  Patient will need an office visit post surgery to discuss possible cath given the nuc. OV can be with me or him.

## 2014-10-24 NOTE — Telephone Encounter (Addendum)
Routed cardiac clearance letter Dr. Hessie Knows, [phone]

## 2014-10-25 ENCOUNTER — Encounter: Payer: Self-pay | Admitting: Internal Medicine

## 2014-10-25 ENCOUNTER — Ambulatory Visit (INDEPENDENT_AMBULATORY_CARE_PROVIDER_SITE_OTHER): Payer: BLUE CROSS/BLUE SHIELD | Admitting: *Deleted

## 2014-10-25 DIAGNOSIS — T82110D Breakdown (mechanical) of cardiac electrode, subsequent encounter: Secondary | ICD-10-CM

## 2014-10-25 LAB — CUP PACEART INCLINIC DEVICE CHECK
Battery Remaining Longevity: 88.8 mo
Battery Voltage: 2.93 V
Brady Statistic RA Percent Paced: 67 %
Date Time Interrogation Session: 20160713094421
Lead Channel Impedance Value: 512.5 Ohm
Lead Channel Pacing Threshold Amplitude: 1.25 V
Lead Channel Pacing Threshold Amplitude: 1.5 V
Lead Channel Pacing Threshold Pulse Width: 0.5 ms
Lead Channel Pacing Threshold Pulse Width: 0.5 ms
Lead Channel Sensing Intrinsic Amplitude: 1.6 mV
Lead Channel Sensing Intrinsic Amplitude: 12 mV
Lead Channel Setting Pacing Amplitude: 2.5 V
Lead Channel Setting Pacing Amplitude: 2.5 V
MDC IDC MSMT LEADCHNL RA PACING THRESHOLD AMPLITUDE: 1.5 V
MDC IDC MSMT LEADCHNL RA PACING THRESHOLD PULSEWIDTH: 0.5 ms
MDC IDC MSMT LEADCHNL RV IMPEDANCE VALUE: 1200 Ohm
MDC IDC PG SERIAL: 7251318
MDC IDC SET LEADCHNL RV PACING PULSEWIDTH: 0.5 ms
MDC IDC STAT BRADY RV PERCENT PACED: 99.77 %
Pulse Gen Model: 2210

## 2014-10-25 LAB — ABO/RH: ABO/RH(D): O POS

## 2014-10-25 NOTE — Progress Notes (Signed)
Follow up w/ industry Gaspar Bidding) due to varying RV lead impedance & persistent atrial noise issues. Noise reproducible w/ isometrics & pocket manipulation in RA bipolar, RA unipolar ring, & RA unipolar tip. Changed mode from DDDR/60 to DOOR/70. Pt is dependent. Concern per industry is lead insulation failure in bipolar & unipolar settings for both leads (leads originally implanted 1996). Lead revision w/ Dr. Lovena Le to be scheduled. Possible device relocation to left side also TBD. Pt having hip surgery next week. Revision TBD thereafter.

## 2014-10-27 ENCOUNTER — Encounter: Payer: Self-pay | Admitting: Internal Medicine

## 2014-10-27 ENCOUNTER — Ambulatory Visit (INDEPENDENT_AMBULATORY_CARE_PROVIDER_SITE_OTHER): Payer: BLUE CROSS/BLUE SHIELD | Admitting: Internal Medicine

## 2014-10-27 VITALS — BP 120/68 | HR 70 | Ht 65.0 in | Wt 187.6 lb

## 2014-10-27 DIAGNOSIS — Z950 Presence of cardiac pacemaker: Secondary | ICD-10-CM | POA: Diagnosis not present

## 2014-10-27 DIAGNOSIS — T82190A Other mechanical complication of cardiac electrode, initial encounter: Secondary | ICD-10-CM | POA: Diagnosis not present

## 2014-10-27 DIAGNOSIS — I519 Heart disease, unspecified: Secondary | ICD-10-CM

## 2014-10-27 DIAGNOSIS — T82110A Breakdown (mechanical) of cardiac electrode, initial encounter: Secondary | ICD-10-CM

## 2014-10-27 NOTE — Progress Notes (Signed)
HPI [name] [name] is referred today by Dr. Caryl Comes for consideration for PM lead extraction. She is a pleasant 59 yo woman with complete heart block, s/p PPM insertion in 1996. She underwent generator change out in 2012. She had developed noise on her atrial and ventricular leads. Her PPM has been reprogrammed DOOR from DDDR. She has not had syncope. No near syncope. She is pending hip surgery. She does not have heart failure symptoms. Allergies  Allergen Reactions  . Marshmallow [Althaea Officinalis]     Rash, hives, itching, SOB  . Meat Extract     Rash, hives itching, SOB.  Can tolerate some meats ONLY in moderation  . Other     Jello - Rash, hives, itching, SOB  . Contrast Media [Iodinated Diagnostic Agents]     unknown  . Latex     rash  . Nickel     rash     Current Outpatient Prescriptions  Medication Sig Dispense Refill  . benazepril (LOTENSIN) 10 MG tablet Take 10 mg by mouth daily.      . clonazePAM (KLONOPIN) 0.5 MG tablet Take 0.5 mg by mouth daily as needed for anxiety.     Marland Kitchen levothyroxine (SYNTHROID, LEVOTHROID) 100 MCG tablet Take 100 mcg by mouth daily.      Marland Kitchen lovastatin (MEVACOR) 20 MG tablet Take 1 tablet (20 mg total) by mouth at bedtime. 30 tablet 3  . metoprolol (LOPRESSOR) 50 MG tablet Take 75 mg by mouth 2 (two) times daily.      . Multiple Vitamins-Minerals (MULTIVITAMIN ADULT PO) Take 1 tablet by mouth daily.    . ranitidine (ZANTAC) 150 MG capsule Take 150 mg by mouth 2 (two) times daily as needed for heartburn.    Marland Kitchen aspirin 81 MG tablet Take 81 mg by mouth daily.      . meloxicam (MOBIC) 15 MG tablet Take 15 mg by mouth daily.     No current facility-administered medications for this visit.     Past Medical History  Diagnosis Date  . Hyperlipidemia   . Hypothyroid   . Cardiomyopathy     tachycardia induced. EF 35-40% 06/2008 "patient denies"  . Hypertension   . Abdominal pain     Intermittent pain with firmness, ache, more at night, pain in  different quadrants each time  . 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 Complete AV block     s/p pacemaker insertion 1996  . Peripheral artery disease     stent on lt illiac  . Peripheral vascular disease   . Ovarian cancer     ovarian, skin     ROS:   All systems reviewed and negative except as noted in the HPI.   Past Surgical History  Procedure Laterality Date  . Pacemaker insertion  2012    Had replaced in July 2012, first placed in 1996  . Knee surgery    . Total abdominal hysterectomy w/ bilateral salpingoophorectomy  07/2008    Ovarian cancer  . Insert / replace / remove pacemaker    . Iliac artery stent  Apr 05, 2010    Left artery stenting  . Angioplasty / stenting iliac    . Abdominal hysterectomy    . Hernia mesh removal Right     incisional     Family History  Problem Relation Age of Onset  . Cancer Father     died of lung cancer  .  Lung cancer Father   . Diabetes Brother   . Hyperlipidemia Mother      History   Social History  . Marital Status: Married    Spouse Name: N/A  . Number of Children: N/A  . Years of Education: N/A   Occupational History  . Not on file.   Social History Main Topics  . Smoking status: Former Smoker    Quit date: 06/12/2008  . Smokeless tobacco: Not on file  . Alcohol Use: 0.0 oz/week     Comment: 4-6 cans beer/ night  . Drug Use: No  . Sexual Activity: Not Currently   Other Topics Concern  . Not on file   Social History Narrative     BP 120/68 mmHg  Pulse 70  Ht 5\' 5"  (1.651 m)  Wt 187 lb 9.6 oz (85.095 kg)  BMI 31.22 kg/m2  Physical Exam:  Well appearing middle aged woman, NAD HEENT: Unremarkable Neck:  6 cm JVD, no thyromegally Back:  No CVA tenderness Lungs:  Clear with no wheezes HEART:  Regular rate rhythm, no murmurs, no rubs, no clicks Abd:  soft, positive bowel sounds, no organomegally, no rebound, no  guarding Ext:  2 plus pulses, no edema, no cyanosis, no clubbing Skin:  No rashes no nodules Neuro:  CN II through XII intact, motor grossly intact   DEVICE  Atrial and ventricular noise has resulted in reprogramming to DOOR  Assess/Plan:

## 2014-10-27 NOTE — Assessment & Plan Note (Signed)
She has approximately 7 years on her battery longevity. She is interested in a MRI compatible pacemaker as well.

## 2014-10-27 NOTE — Assessment & Plan Note (Signed)
She is encouraged to lose weight and reduce her fat intake. She is on statin therapy as well.

## 2014-10-27 NOTE — Patient Instructions (Addendum)
Medication Instructions:  Your physician recommends that you continue on your current medications as directed. Please refer to the Current Medication list given to you today.   Labwork: None ordered  Testing/Procedures: Lead extraction 8/22, 8/24, 8/26  Follow-Up: Your physician recommends that you schedule a follow-up appointment in device clinic for wound check  After procedure    Any Other Special Instructions Will Be Listed Below (If Applicable).

## 2014-10-27 NOTE — Assessment & Plan Note (Signed)
Her St. Jude device is as programmed and she is currently stable with sensing turned off. We discussed the treatment options in detail. I have offered her lead extraction but also insertion of new leads only, leaving the old leads in place. She will like to have everything removed. At her young age, I think that this is reasonable.

## 2014-10-27 NOTE — Assessment & Plan Note (Signed)
With lead extraction, I would anticipate placing a BiV pacemaker system as she has an EF of 40% by echo and would likely be worse in the long term with RV/RA pacing

## 2014-10-31 ENCOUNTER — Inpatient Hospital Stay: Payer: BLUE CROSS/BLUE SHIELD | Admitting: Certified Registered"

## 2014-10-31 ENCOUNTER — Encounter
Admission: RE | Disposition: A | Discharge status: Skilled Nursing Facility | Payer: Self-pay | Source: Ambulatory Visit | Attending: Orthopedic Surgery

## 2014-10-31 ENCOUNTER — Inpatient Hospital Stay: Payer: BLUE CROSS/BLUE SHIELD

## 2014-10-31 ENCOUNTER — Encounter: Payer: Self-pay | Admitting: *Deleted

## 2014-10-31 ENCOUNTER — Inpatient Hospital Stay
Admission: RE | Admit: 2014-10-31 | Discharge: 2014-11-03 | DRG: 470 | Disposition: A | Discharge status: Skilled Nursing Facility | Payer: BLUE CROSS/BLUE SHIELD | Source: Ambulatory Visit | Attending: Orthopedic Surgery | Admitting: Orthopedic Surgery

## 2014-10-31 DIAGNOSIS — I959 Hypotension, unspecified: Secondary | ICD-10-CM | POA: Diagnosis present

## 2014-10-31 DIAGNOSIS — K219 Gastro-esophageal reflux disease without esophagitis: Secondary | ICD-10-CM | POA: Diagnosis present

## 2014-10-31 DIAGNOSIS — Z85820 Personal history of malignant melanoma of skin: Secondary | ICD-10-CM

## 2014-10-31 DIAGNOSIS — E785 Hyperlipidemia, unspecified: Secondary | ICD-10-CM | POA: Diagnosis present

## 2014-10-31 DIAGNOSIS — Z79899 Other long term (current) drug therapy: Secondary | ICD-10-CM

## 2014-10-31 DIAGNOSIS — Z8589 Personal history of malignant neoplasm of other organs and systems: Secondary | ICD-10-CM

## 2014-10-31 DIAGNOSIS — Z8543 Personal history of malignant neoplasm of ovary: Secondary | ICD-10-CM | POA: Diagnosis not present

## 2014-10-31 DIAGNOSIS — Z950 Presence of cardiac pacemaker: Secondary | ICD-10-CM

## 2014-10-31 DIAGNOSIS — M1611 Unilateral primary osteoarthritis, right hip: Principal | ICD-10-CM | POA: Diagnosis present

## 2014-10-31 DIAGNOSIS — Z7982 Long term (current) use of aspirin: Secondary | ICD-10-CM

## 2014-10-31 DIAGNOSIS — Z419 Encounter for procedure for purposes other than remedying health state, unspecified: Secondary | ICD-10-CM

## 2014-10-31 DIAGNOSIS — I442 Atrioventricular block, complete: Secondary | ICD-10-CM | POA: Diagnosis present

## 2014-10-31 DIAGNOSIS — I429 Cardiomyopathy, unspecified: Secondary | ICD-10-CM | POA: Diagnosis present

## 2014-10-31 DIAGNOSIS — G8918 Other acute postprocedural pain: Secondary | ICD-10-CM

## 2014-10-31 DIAGNOSIS — I10 Essential (primary) hypertension: Secondary | ICD-10-CM | POA: Diagnosis present

## 2014-10-31 DIAGNOSIS — E039 Hypothyroidism, unspecified: Secondary | ICD-10-CM | POA: Diagnosis present

## 2014-10-31 DIAGNOSIS — I739 Peripheral vascular disease, unspecified: Secondary | ICD-10-CM | POA: Diagnosis present

## 2014-10-31 HISTORY — PX: JOINT REPLACEMENT: SHX530

## 2014-10-31 HISTORY — PX: TOTAL HIP ARTHROPLASTY: SHX124

## 2014-10-31 LAB — CBC
HEMATOCRIT: 36.2 % (ref 35.0–47.0)
HEMOGLOBIN: 12 g/dL (ref 12.0–16.0)
MCH: 29.8 pg (ref 26.0–34.0)
MCHC: 33.2 g/dL (ref 32.0–36.0)
MCV: 90 fL (ref 80.0–100.0)
PLATELETS: 181 10*3/uL (ref 150–440)
RBC: 4.02 MIL/uL (ref 3.80–5.20)
RDW: 16.2 % — AB (ref 11.5–14.5)
WBC: 7.4 10*3/uL (ref 3.6–11.0)

## 2014-10-31 LAB — CREATININE, SERUM: Creatinine, Ser: 0.69 mg/dL (ref 0.44–1.00)

## 2014-10-31 SURGERY — ARTHROPLASTY, HIP, TOTAL, ANTERIOR APPROACH
Anesthesia: Spinal | Site: Hip | Laterality: Right | Wound class: Clean

## 2014-10-31 MED ORDER — ADULT MULTIVITAMIN W/MINERALS CH
1.0000 | ORAL_TABLET | Freq: Every day | ORAL | Status: DC
  Administered 2014-11-01 – 2014-11-03 (×3): 1 via ORAL
  Filled 2014-10-31 (×3): qty 1

## 2014-10-31 MED ORDER — SODIUM CHLORIDE 0.9 % IV SOLN
INTRAVENOUS | Status: DC
  Administered 2014-10-31 – 2014-11-01 (×2): via INTRAVENOUS

## 2014-10-31 MED ORDER — ONDANSETRON HCL 4 MG/2ML IJ SOLN: 4.0000 mg | Freq: Four times a day (QID) | INTRAMUSCULAR | Status: DC | PRN

## 2014-10-31 MED ORDER — ACETAMINOPHEN 650 MG RE SUPP: 650.0000 mg | Freq: Four times a day (QID) | RECTAL | Status: DC | PRN

## 2014-10-31 MED ORDER — CLONAZEPAM 0.5 MG PO TABS
0.5000 mg | ORAL_TABLET | Freq: Every day | ORAL | Status: DC
  Administered 2014-10-31 – 2014-11-02 (×3): 0.5 mg via ORAL
  Filled 2014-10-31 (×3): qty 1

## 2014-10-31 MED ORDER — EPHEDRINE SULFATE 50 MG/ML IJ SOLN
INTRAMUSCULAR | Status: DC | PRN
  Administered 2014-10-31 (×2): 5 mg via INTRAVENOUS
  Administered 2014-10-31: 10 mg via INTRAVENOUS
  Administered 2014-10-31: 5 mg via INTRAVENOUS

## 2014-10-31 MED ORDER — LACTATED RINGERS IV SOLN
INTRAVENOUS | Status: DC
  Administered 2014-10-31 (×3): via INTRAVENOUS

## 2014-10-31 MED ORDER — MAGNESIUM CITRATE PO SOLN: 1.0000 | Freq: Once | ORAL | Status: AC | PRN

## 2014-10-31 MED ORDER — LEVOTHYROXINE SODIUM 100 MCG PO TABS
100.0000 ug | ORAL_TABLET | ORAL | Status: DC
Start: 2014-11-01 — End: 2014-11-03
  Administered 2014-11-01 – 2014-11-03 (×3): 100 ug via ORAL
  Filled 2014-10-31 (×3): qty 1

## 2014-10-31 MED ORDER — FENTANYL CITRATE (PF) 100 MCG/2ML IJ SOLN
INTRAMUSCULAR | Status: DC | PRN
  Administered 2014-10-31: 25 ug via INTRAVENOUS
  Administered 2014-10-31: 50 ug via INTRAVENOUS
  Administered 2014-10-31: 25 ug via INTRAVENOUS

## 2014-10-31 MED ORDER — DOCUSATE SODIUM 100 MG PO CAPS
100.0000 mg | ORAL_CAPSULE | Freq: Two times a day (BID) | ORAL | Status: DC
  Administered 2014-10-31 – 2014-11-03 (×6): 100 mg via ORAL
  Filled 2014-10-31 (×6): qty 1

## 2014-10-31 MED ORDER — METOCLOPRAMIDE HCL 10 MG PO TABS: 5.0000 mg | ORAL_TABLET | Freq: Three times a day (TID) | ORAL | Status: DC | PRN

## 2014-10-31 MED ORDER — BUPIVACAINE HCL (PF) 0.5 % IJ SOLN
INTRAMUSCULAR | Status: DC | PRN
  Administered 2014-10-31: 3 mL via INTRATHECAL

## 2014-10-31 MED ORDER — BUPIVACAINE-EPINEPHRINE 0.25% -1:200000 IJ SOLN
INTRAMUSCULAR | Status: DC | PRN
  Administered 2014-10-31: 30 mL

## 2014-10-31 MED ORDER — METOPROLOL TARTRATE 50 MG PO TABS
75.0000 mg | ORAL_TABLET | Freq: Two times a day (BID) | ORAL | Status: DC
  Administered 2014-10-31: 75 mg via ORAL
  Filled 2014-10-31: qty 1

## 2014-10-31 MED ORDER — METOCLOPRAMIDE HCL 5 MG/ML IJ SOLN: 5.0000 mg | Freq: Three times a day (TID) | INTRAMUSCULAR | Status: DC | PRN

## 2014-10-31 MED ORDER — CEFAZOLIN SODIUM-DEXTROSE 2-3 GM-% IV SOLR
2.0000 g | Freq: Once | INTRAVENOUS | Status: AC
  Administered 2014-10-31: 2 g via INTRAVENOUS

## 2014-10-31 MED ORDER — FAMOTIDINE 20 MG PO TABS
10.0000 mg | ORAL_TABLET | Freq: Two times a day (BID) | ORAL | Status: DC
  Administered 2014-10-31 – 2014-11-03 (×6): 10 mg via ORAL
  Filled 2014-10-31 (×6): qty 1

## 2014-10-31 MED ORDER — PHENYLEPHRINE HCL 10 MG/ML IJ SOLN
INTRAMUSCULAR | Status: DC | PRN
  Administered 2014-10-31 (×4): 100 ug via INTRAVENOUS

## 2014-10-31 MED ORDER — METHOCARBAMOL 1000 MG/10ML IJ SOLN: 500.0000 mg | Freq: Four times a day (QID) | INTRAVENOUS | Status: DC | PRN

## 2014-10-31 MED ORDER — MORPHINE SULFATE 2 MG/ML IJ SOLN
2.0000 mg | INTRAMUSCULAR | Status: DC | PRN
  Administered 2014-10-31 – 2014-11-01 (×3): 2 mg via INTRAVENOUS
  Filled 2014-10-31 (×3): qty 1

## 2014-10-31 MED ORDER — ZOLPIDEM TARTRATE 5 MG PO TABS: 5.0000 mg | ORAL_TABLET | Freq: Every evening | ORAL | Status: DC | PRN

## 2014-10-31 MED ORDER — OXYCODONE HCL 5 MG PO TABS
5.0000 mg | ORAL_TABLET | ORAL | Status: DC | PRN
  Administered 2014-10-31 (×3): 5 mg via ORAL
  Administered 2014-11-01: 10 mg via ORAL
  Administered 2014-11-01 (×2): 5 mg via ORAL
  Administered 2014-11-01: 10 mg via ORAL
  Administered 2014-11-01 – 2014-11-02 (×2): 5 mg via ORAL
  Administered 2014-11-02 (×2): 10 mg via ORAL
  Administered 2014-11-03 (×3): 5 mg via ORAL
  Filled 2014-10-31 (×2): qty 1
  Filled 2014-10-31: qty 2
  Filled 2014-10-31: qty 10
  Filled 2014-10-31: qty 1
  Filled 2014-10-31: qty 2
  Filled 2014-10-31 (×5): qty 1
  Filled 2014-10-31: qty 2
  Filled 2014-10-31 (×4): qty 1

## 2014-10-31 MED ORDER — NEOMYCIN-POLYMYXIN B GU 40-200000 IR SOLN
Status: AC
  Filled 2014-10-31: qty 4

## 2014-10-31 MED ORDER — FENTANYL CITRATE (PF) 100 MCG/2ML IJ SOLN: 25.0000 ug | INTRAMUSCULAR | Status: DC | PRN

## 2014-10-31 MED ORDER — ONDANSETRON HCL 4 MG/2ML IJ SOLN: 4.0000 mg | Freq: Once | INTRAMUSCULAR | Status: DC | PRN

## 2014-10-31 MED ORDER — BISACODYL 10 MG RE SUPP
10.0000 mg | Freq: Every day | RECTAL | Status: DC | PRN
  Administered 2014-11-03: 10 mg via RECTAL
  Filled 2014-10-31: qty 1

## 2014-10-31 MED ORDER — LIDOCAINE HCL (PF) 2 % IJ SOLN
INTRAMUSCULAR | Status: DC | PRN
  Administered 2014-10-31: 50 mg

## 2014-10-31 MED ORDER — GLYCOPYRROLATE 0.2 MG/ML IJ SOLN
INTRAMUSCULAR | Status: DC | PRN
Start: 2014-10-31 — End: 2014-10-31
  Administered 2014-10-31: 0.2 mg via INTRAVENOUS

## 2014-10-31 MED ORDER — KETAMINE HCL 50 MG/ML IJ SOLN
INTRAMUSCULAR | Status: DC | PRN
  Administered 2014-10-31: 50 mg via INTRAVENOUS

## 2014-10-31 MED ORDER — PHENOL 1.4 % MT LIQD: 1.0000 | OROMUCOSAL | Status: DC | PRN

## 2014-10-31 MED ORDER — ACETAMINOPHEN 325 MG PO TABS: 650.0000 mg | ORAL_TABLET | Freq: Four times a day (QID) | ORAL | Status: DC | PRN

## 2014-10-31 MED ORDER — BENAZEPRIL HCL 20 MG PO TABS: 10.0000 mg | ORAL_TABLET | Freq: Every day | ORAL | Status: DC

## 2014-10-31 MED ORDER — CEFAZOLIN SODIUM-DEXTROSE 2-3 GM-% IV SOLR
2.0000 g | Freq: Four times a day (QID) | INTRAVENOUS | Status: AC
  Administered 2014-10-31 – 2014-11-01 (×3): 2 g via INTRAVENOUS
  Filled 2014-10-31 (×3): qty 50

## 2014-10-31 MED ORDER — PROPOFOL INFUSION 10 MG/ML OPTIME
INTRAVENOUS | Status: DC | PRN
Start: 2014-10-31 — End: 2014-10-31
  Administered 2014-10-31: 75 ug/kg/min via INTRAVENOUS

## 2014-10-31 MED ORDER — METHOCARBAMOL 500 MG PO TABS
500.0000 mg | ORAL_TABLET | Freq: Four times a day (QID) | ORAL | Status: DC | PRN
  Administered 2014-10-31: 500 mg via ORAL
  Filled 2014-10-31: qty 1

## 2014-10-31 MED ORDER — DIPHENHYDRAMINE HCL 12.5 MG/5ML PO ELIX: 12.5000 mg | ORAL_SOLUTION | ORAL | Status: DC | PRN

## 2014-10-31 MED ORDER — NEOMYCIN-POLYMYXIN B GU 40-200000 IR SOLN
Status: DC | PRN
Start: 2014-10-31 — End: 2014-10-31
  Administered 2014-10-31: 4 mL

## 2014-10-31 MED ORDER — ENOXAPARIN SODIUM 40 MG/0.4ML ~~LOC~~ SOLN
40.0000 mg | SUBCUTANEOUS | Status: DC
  Administered 2014-11-01 – 2014-11-03 (×3): 40 mg via SUBCUTANEOUS
  Filled 2014-10-31 (×3): qty 0.4

## 2014-10-31 MED ORDER — BUPIVACAINE-EPINEPHRINE (PF) 0.25% -1:200000 IJ SOLN
INTRAMUSCULAR | Status: AC
  Filled 2014-10-31: qty 30

## 2014-10-31 MED ORDER — MIDAZOLAM HCL 5 MG/5ML IJ SOLN
INTRAMUSCULAR | Status: DC | PRN
  Administered 2014-10-31: 1 mg via INTRAVENOUS
  Administered 2014-10-31: 2 mg via INTRAVENOUS

## 2014-10-31 MED ORDER — ONDANSETRON HCL 4 MG PO TABS: 4.0000 mg | ORAL_TABLET | Freq: Four times a day (QID) | ORAL | Status: DC | PRN

## 2014-10-31 MED ORDER — CEFAZOLIN SODIUM-DEXTROSE 2-3 GM-% IV SOLR
INTRAVENOUS | Status: AC
  Filled 2014-10-31: qty 50

## 2014-10-31 MED ORDER — MAGNESIUM HYDROXIDE 400 MG/5ML PO SUSP
30.0000 mL | Freq: Every day | ORAL | Status: DC | PRN
  Administered 2014-11-02 – 2014-11-03 (×2): 30 mL via ORAL
  Filled 2014-10-31 (×2): qty 30

## 2014-10-31 MED ORDER — MENTHOL 3 MG MT LOZG: 1.0000 | LOZENGE | OROMUCOSAL | Status: DC | PRN

## 2014-10-31 MED ORDER — PRAVASTATIN SODIUM 20 MG PO TABS
20.0000 mg | ORAL_TABLET | Freq: Every day | ORAL | Status: DC
  Administered 2014-10-31 – 2014-11-02 (×3): 20 mg via ORAL
  Filled 2014-10-31 (×3): qty 1

## 2014-10-31 SURGICAL SUPPLY — 43 items
BLADE SAW 1/2 (BLADE) ×2 IMPLANT
BNDG COHESIVE 6X5 TAN STRL LF (GAUZE/BANDAGES/DRESSINGS) ×4 IMPLANT
CANISTER SUCT 1200ML W/VALVE (MISCELLANEOUS) ×2 IMPLANT
CAPT HIP TOTAL 3 ×2 IMPLANT
CATH FOL LEG HOLDER (MISCELLANEOUS) ×2 IMPLANT
CATH TRAY 16F METER LATEX (MISCELLANEOUS) ×2 IMPLANT
CHLORAPREP W/TINT 26ML (MISCELLANEOUS) ×2 IMPLANT
DRAPE C-ARM XRAY 36X54 (DRAPES) ×2 IMPLANT
DRAPE INCISE IOBAN 66X60 STRL (DRAPES) IMPLANT
DRAPE POUCH INSTRU U-SHP 10X18 (DRAPES) ×2 IMPLANT
DRAPE SHEET LG 3/4 BI-LAMINATE (DRAPES) ×6 IMPLANT
DRAPE TABLE BACK 80X90 (DRAPES) ×2 IMPLANT
ELECT BLADE 6.5 EXT (BLADE) ×2 IMPLANT
GAUZE SPONGE 4X4 12PLY STRL (GAUZE/BANDAGES/DRESSINGS) ×2 IMPLANT
GLOVE BIOGEL PI IND STRL 9 (GLOVE) ×1 IMPLANT
GLOVE BIOGEL PI INDICATOR 9 (GLOVE) ×1
GLOVE SURG ORTHO 9.0 STRL STRW (GLOVE) ×2 IMPLANT
GOWN SPECIALTY ULTRA XL (MISCELLANEOUS) ×2 IMPLANT
GOWN STRL REUS W/ TWL LRG LVL3 (GOWN DISPOSABLE) ×1 IMPLANT
GOWN STRL REUS W/TWL LRG LVL3 (GOWN DISPOSABLE) ×2
HEMOVAC 400CC 10FR (MISCELLANEOUS) ×2 IMPLANT
HOOD PEEL AWAY FACE SHEILD DIS (HOOD) ×2 IMPLANT
MAT BLUE FLOOR 46X72 FLO (MISCELLANEOUS) ×2 IMPLANT
NDL SAFETY 18GX1.5 (NEEDLE) ×2 IMPLANT
NEEDLE SPNL 18GX3.5 QUINCKE PK (NEEDLE) ×2 IMPLANT
NS IRRIG 1000ML POUR BTL (IV SOLUTION) ×2 IMPLANT
PACK HIP COMPR (MISCELLANEOUS) ×2 IMPLANT
SOL PREP PVP 2OZ (MISCELLANEOUS) ×2
SOLUTION PREP PVP 2OZ (MISCELLANEOUS) ×1 IMPLANT
STAPLER SKIN PROX 35W (STAPLE) ×2 IMPLANT
STRAP SAFETY BODY (MISCELLANEOUS) ×2 IMPLANT
SUT DVC 2 QUILL PDO  T11 36X36 (SUTURE) ×1
SUT DVC 2 QUILL PDO T11 36X36 (SUTURE) ×1 IMPLANT
SUT DVC QUILL MONODERM 30X30 (SUTURE) ×2 IMPLANT
SUT ETHIBOND NAB CT1 #1 30IN (SUTURE) ×2 IMPLANT
SUT SILK 0 (SUTURE) ×2
SUT SILK 0 30XBRD TIE 6 (SUTURE) ×1 IMPLANT
SUT VIC AB 1 CT1 36 (SUTURE) ×2 IMPLANT
SYR 20CC LL (SYRINGE) ×2 IMPLANT
SYR 30ML LL (SYRINGE) ×2 IMPLANT
TAPE MICROFOAM 4IN (TAPE) ×2 IMPLANT
TUBE KAMVAC SUCTION (TUBING) ×2 IMPLANT
WATER STERILE IRR 1000ML POUR (IV SOLUTION) ×2 IMPLANT

## 2014-10-31 NOTE — Transfer of Care (Signed)
Immediate Anesthesia Transfer of Care Note  Patient: [name] [name]  Procedure(s) Performed: Procedure(s): TOTAL HIP ARTHROPLASTY ANTERIOR APPROACH (Right)  Patient Location: PACU  Anesthesia Type:Spinal  Level of Consciousness: awake  Airway & Oxygen Therapy: Patient Spontanous Breathing and Patient connected to face mask oxygen  Post-op Assessment: Report given to RN  Post vital signs: Reviewed  Last Vitals:  Filed Vitals:   10/31/14 0925  BP: 116/71  Pulse: 68  Temp: 36.7 C  Resp: 18    Complications: No apparent anesthesia complications

## 2014-10-31 NOTE — H&P (Signed)
Reviewed paper H+P, will be scanned into chart. No changes noted.  

## 2014-10-31 NOTE — Op Note (Signed)
10/31/2014  1:37 PM  PATIENT:  [name] [name]  59 y.o. female  PRE-OPERATIVE DIAGNOSIS:  osteoarthritis  POST-OPERATIVE DIAGNOSIS:  osteoarthritis  PROCEDURE:  Procedure(s): TOTAL HIP ARTHROPLASTY ANTERIOR APPROACH (Right)  SURGEON: Laurene Footman, MD  ASSISTANTS: None  ANESTHESIA:   spinal  EBL:  Total I/O In: 1000 [I.V.:1000] Out: 900 [Urine:400; Blood:500]  BLOOD ADMINISTERED:none  DRAINS: none   LOCAL MEDICATIONS USED:  MARCAINE     SPECIMEN:  Source of Specimen:  Right femoral head  DISPOSITION OF SPECIMEN:  PATHOLOGY  COUNTS:  YES  TOURNIQUET:  * No tourniquets in log *  IMPLANTS:  Medacta AMIS 3 STEM, 52 MM Mpact cup DM, with liner and M 28 mm head  DICTATION: .Dragon Dictation  The patient was brought to the operating room and after spinal anesthesia was obtained The patient laced on the operative table with the ipsilateral foot into the Medacta attachment, contralateral leg on a well-padded table. C-arm was brought in and preop template x-ray taken. After prepping and draping in usual sterile fashion appropriate patient identification and timeout procedures were completed. Anterior approach to the hip was obtained and centered over the greater trochanter and TFL muscle. The subcutaneous tissue was incised hemostasis being achieved by electrocautery. TFL fascia was incised and the muscle retracted laterally deep retractor placed. The lateral femoral circumflex vessels were identified and ligated. The anterior capsule was exposed and a capsulotomy performed. The neck was identified and a femoral neck cut carried out with a saw. The head was removed without difficulty and showed sclerotic femoral head and acetabulum. Reaming was carried out to52 52  mm cup trial gave appropriate tightness to the acetabular component 52 Mpact  cup as impacted into position. The leg was then externally rotated and ischiofemoral and patellofemoral releases carried out. The femur was  sequentially broached to a size 3, size 3 femoral and M head  trials were placed and the final components chosen. The 3 stem was inserted along with a  M8 mm head an52  mm liner. The hip was reduced and was stable the wound was thoroughly irrigated with a dilute Betadine solution. The deep fascia view. Using a heavy Quill after infiltration of 30 cc of quarter percent Sensorcaine with epinephrine. 2-0 Quill to close the skin with skin staples Xeroform 4 x 4's ABDs and tape patient was sent to recovery in stable condition complications none specimen removed femoral head issue comes stable.   PLAN OF CARE: Admit to inpatient

## 2014-10-31 NOTE — Anesthesia Procedure Notes (Signed)
Spinal Patient location during procedure: OR Start time: 10/31/2014 11:40 AM End time: 10/31/2014 11:45 AM Staffing Anesthesiologist: Marline Backbone F Resident/CRNA: Rolla Plate Performed by: resident/CRNA  Preanesthetic Checklist Completed: patient identified, site marked, surgical consent, pre-op evaluation, timeout performed, IV checked, risks and benefits discussed and monitors and equipment checked Spinal Block Patient position: sitting Prep: Betadine and site prepped and draped Patient monitoring: heart rate, continuous pulse ox, blood pressure and cardiac monitor Approach: midline Location: L4-5 Injection technique: single-shot Needle Needle type: Whitacre and Introducer  Needle gauge: 25 G Needle length: 9 cm Additional Notes Negative paresthesia. Negative blood return. Positive free-flowing CSF. Expiration date of kit checked and confirmed. Patient tolerated procedure well, without complications.

## 2014-10-31 NOTE — Anesthesia Postprocedure Evaluation (Signed)
  Anesthesia Post-op Note  Patient: [name] [name]  Procedure(s) Performed: Procedure(s): TOTAL HIP ARTHROPLASTY ANTERIOR APPROACH (Right)  Anesthesia type:Spinal  Patient location: PACU  Post pain: Pain level controlled  Post assessment: Post-op Vital signs reviewed, Patient's Cardiovascular Status Stable, Respiratory Function Stable, Patent Airway and No signs of Nausea or vomiting  Post vital signs: Reviewed and stable  Last Vitals:  Filed Vitals:   10/31/14 0925  BP: 116/71  Pulse: 68  Temp: 36.7 C  Resp: 18    Level of consciousness: awake, alert  and patient cooperative  Complications: No apparent anesthesia complications

## 2014-10-31 NOTE — Progress Notes (Signed)
   10/31/14 1530  Clinical Encounter Type  Visited With Patient and family together  Visit Type Initial  Referral From Nurse  Consult/Referral To Chaplain  Spiritual Encounters  Spiritual Needs Literature  Stress Factors  Patient Stress Factors Health changes  Family Stress Factors Health changes  Advance Directives (For Healthcare)  Does patient have an advance directive? No  Would patient like information on creating an advanced directive? Yes - Educational materials given  Does patient want to make changes to advanced directive? Yes - information given  Patient requested current AD materials. Provided materials and thoroughly explained purpose & process. Patient requested time to review and determine if she wanted to continue. Advised patient to ask nursing staff to notify Chaplain if she decided to move forward.  Chap. Shyan Scalisi G. [place]

## 2014-10-31 NOTE — Anesthesia Preprocedure Evaluation (Signed)
Anesthesia Evaluation  Patient identified by MRN, date of birth, ID band Patient awake    Reviewed: Allergy & Precautions, NPO status , Patient's Chart, lab work & pertinent test results  Airway Mallampati: II       Dental no notable dental hx.    Pulmonary neg pulmonary ROS, former smoker,    + decreased breath sounds      Cardiovascular hypertension, Pt. on home beta blockers + Peripheral Vascular Disease + pacemaker Rate:Normal     Neuro/Psych negative neurological ROS  negative psych ROS   GI/Hepatic Neg liver ROS, (+)     substance abuse  ,   Endo/Other  Hypothyroidism   Renal/GU   negative genitourinary   Musculoskeletal   Abdominal Normal abdominal exam  (+)   Peds negative pediatric ROS (+)  Hematology   Anesthesia Other Findings   Reproductive/Obstetrics                             Anesthesia Physical Anesthesia Plan  ASA: III  Anesthesia Plan: Spinal   Post-op Pain Management: MAC Combined w/ Regional for Post-op pain   Induction: Intravenous  Airway Management Planned: Nasal Cannula  Additional Equipment:   Intra-op Plan:   Post-operative Plan:   Informed Consent: I have reviewed the patients History and Physical, chart, labs and discussed the procedure including the risks, benefits and alternatives for the proposed anesthesia with the patient or authorized representative who has indicated his/her understanding and acceptance.     Plan Discussed with: CRNA  Anesthesia Plan Comments:         Anesthesia Quick Evaluation

## 2014-11-01 ENCOUNTER — Encounter
Admission: RE | Admit: 2014-11-01 | Discharge: 2014-11-01 | Disposition: A | Discharge status: Home / Self Care | Payer: BLUE CROSS/BLUE SHIELD | Source: Ambulatory Visit | Attending: Internal Medicine | Admitting: Internal Medicine

## 2014-11-01 ENCOUNTER — Encounter: Payer: Self-pay | Admitting: Orthopedic Surgery

## 2014-11-01 MED ORDER — MEPERIDINE HCL 25 MG/ML IJ SOLN
25.0000 mg | INTRAMUSCULAR | Status: DC | PRN
  Administered 2014-11-01: 25 mg via INTRAVENOUS
  Filled 2014-11-01: qty 1

## 2014-11-01 MED ORDER — METOPROLOL TARTRATE 25 MG PO TABS
75.0000 mg | ORAL_TABLET | Freq: Two times a day (BID) | ORAL | Status: DC
  Filled 2014-11-01 (×3): qty 3

## 2014-11-01 MED ORDER — BENAZEPRIL HCL 10 MG PO TABS
10.0000 mg | ORAL_TABLET | Freq: Every day | ORAL | Status: DC
  Filled 2014-11-01 (×2): qty 1

## 2014-11-01 NOTE — Addendum Note (Signed)
Addendum  created 11/01/14 0726 by [place] edited: Notes Section   Notes Section:  File: [id number]

## 2014-11-01 NOTE — Clinical Social Work Placement (Signed)
   CLINICAL SOCIAL WORK PLACEMENT  NOTE  Date:  11/01/2014  Patient Details  Name: [name] [name] MRN: [id number] Date of Birth: Ginger 22, 1957  Clinical Social Work is seeking post-discharge placement for this patient at the [place] level of care (*CSW will initial, date and re-position this form in  chart as items are completed):  Yes   Patient/family provided with [place] Work Department's list of facilities offering this level of care within the geographic area requested by the patient (or if unable, by the patient's family).  Yes   Patient/family informed of their freedom to choose among providers that offer the needed level of care, that participate in Medicare, Medicaid or managed care program needed by the patient, have an available bed and are willing to accept the patient.  Yes   Patient/family informed of [facility]'s ownership interest in [facility], as well as of the fact that they are under no obligation to receive care at these facilities.  PASRR submitted to EDS on 11/01/14     PASRR number received on 11/01/14     Existing PASRR number confirmed on       FL2 transmitted to all facilities in geographic area requested by pt/family on 11/01/14     FL2 transmitted to all facilities within larger geographic area on       Patient informed that his/her managed care company has contracts with or will negotiate with certain facilities, including the following:            Patient/family informed of bed offers received.  Patient chooses bed at       Physician recommends and patient chooses bed at      Patient to be transferred to   on  .  Patient to be transferred to facility by       Patient family notified on   of transfer.  Name of family member notified:        PHYSICIAN Please sign FL2     Additional Comment:    _______________________________________________ Loralyn Freshwater, LCSW 11/01/2014, 3:29  PM

## 2014-11-01 NOTE — Anesthesia Postprocedure Evaluation (Signed)
  Anesthesia Post-op Note  Patient: [name] [name]  Procedure(s) Performed: Procedure(s): TOTAL HIP ARTHROPLASTY ANTERIOR APPROACH (Right)  Anesthesia type:Spinal  Patient location: PACU  Post pain: Pain level controlled  Post assessment: Post-op Vital signs reviewed, Patient's Cardiovascular Status Stable, Respiratory Function Stable, Patent Airway and No signs of Nausea or vomiting  Post vital signs: Reviewed and stable  Last Vitals:  Filed Vitals:   11/01/14 0541  BP: 94/52  Pulse: 70  Temp: 37.9 C  Resp: 18    Level of consciousness: awake, alert  and patient cooperative  Complications: No apparent anesthesia complications

## 2014-11-01 NOTE — Progress Notes (Signed)
Physical Therapy Treatment Patient Details Name: [name] [name] MRN: [id number] DOB: [birth year] Today's Date: 11/01/2014    History of Present Illness This patient is a 59 year old female who came to [facility] for a R THA anterior aproach 10/31/14.    PT Comments    Pt c/o dizziness and not feeling well sitting in chair upon PT entering room (nursing reports pt's BP has been low and most recent BP 95/46).  Pt ambulated short distance to commode with RW and then assisted into bed d/t pt continuing to report dizziness and not feeling well (limiting session activities).  Pt's BP noted to be 116/72 semi-supine in bed and pt reporting decreased dizziness once in bed.  Continue to recommend pt discharge to STR when medically appropriate.  Follow Up Recommendations  SNF     Equipment Recommendations  Rolling walker with 5" wheels;3in1 (PT)    Recommendations for Other Services       Precautions / Restrictions Precautions Precautions: Anterior Hip;Fall Restrictions Weight Bearing Restrictions: Yes RLE Weight Bearing: Weight bearing as tolerated    Mobility  Bed Mobility Overal bed mobility: Needs Assistance Bed Mobility: Sit to Supine       Sit to supine: Mod assist   General bed mobility comments: assist for B LE's sit to supine; use of trapeze to scoot self up in bed with vc's for technique required  Transfers Overall transfer level: Needs assistance Equipment used: Rolling walker (2 wheeled) Transfers: Sit to/from [facility] Sit to Stand: Mod assist;Min assist (mod assist from recliner; min assist from bedside commode) Stand pivot transfers: Min assist (transfer to commode)       General transfer comment: assist to initiate stand and continue standing upright with RW; vc's required for UE and LE placement and technique for transfers  Ambulation/Gait Ambulation/Gait assistance: Min assist Ambulation Distance (Feet): 3 Feet Assistive device: Rolling walker (2  wheeled)   Gait velocity: decreased   General Gait Details: antalgic; decreased stance time L LE; decreased B foot clearance/heelstrike; decreased step length L>R; limited d/t pt reporting not feeling well and reporting dizziness   Stairs            Wheelchair Mobility    Modified Rankin (Stroke Patients Only)       Balance Overall balance assessment: Needs assistance Sitting-balance support: Bilateral upper extremity supported;Feet supported Sitting balance-Leahy Scale: Good     Standing balance support: Bilateral upper extremity supported Standing balance-Leahy Scale: Fair                      Cognition Arousal/Alertness: Awake/alert Behavior During Therapy: Anxious Overall Cognitive Status: Within Functional Limits for tasks assessed       Memory: Decreased recall of precautions              Exercises       General Comments   Nursing cleared pt for participation in physical therapy.  Pt agreeable to PT session.       Pertinent Vitals/Pain Pain Assessment: 0-10 Pain Score: 5  Pain Location: R hip Pain Descriptors / Indicators: Sharp;Shooting Pain Intervention(s): Limited activity within patient's tolerance;Monitored during session;Repositioned    Home Living Family/patient expects to be discharged to::  (Patient thinking she may have to go to rehab) Living Arrangements: Spouse/significant other (spouse works)   Type of Home: House Home Access: Stairs to enter Entrance Stairs-Rails: None Home Layout: Two level (but can stay on main floor) Home Equipment: [place] - single point  Prior Function Level of Independence: Independent with assistive device(s)      Comments: Occasional cane use   PT Goals (current goals can now be found in the care plan section) Acute Rehab PT Goals Patient Stated Goal: Less pain PT Goal Formulation: With patient Time For Goal Achievement: 11/15/14 Potential to Achieve Goals: Good Progress towards PT  goals: Progressing toward goals    Frequency  BID    PT Plan Current plan remains appropriate    Co-evaluation             End of Session Equipment Utilized During Treatment: Gait belt Activity Tolerance: Other (comment) (Limited d/t pt's c/o dizziness and not feeling well) Patient left: in bed;with call bell/phone within reach;with bed alarm set;with SCD's reapplied (B heels elevated via pillow)     Time: 1335-1400 PT Time Calculation (min) (ACUTE ONLY): 25 min  Charges:  $Therapeutic Exercise: 8-22 mins $Therapeutic Activity: 8-22 mins                    G CodesLeitha Bleak 11/15/2014, 3:24 PM Leitha Bleak, [place]

## 2014-11-01 NOTE — Progress Notes (Signed)
   Subjective: 1 Day Post-Op Procedure(s) (LRB): TOTAL HIP ARTHROPLASTY ANTERIOR APPROACH (Right) Patient reports pain as 5 on 0-10 scale.   Patient is well, and has had no acute complaints or problems We will start therapy today.  Tolerating po well  Objective: Vital signs in last 24 hours: Temp:  [96.8 F (36 C)-100.3 F (37.9 C)] 99.3 F (37.4 C) (07/20 0749) Pulse Rate:  [68-81] 76 (07/20 0749) Resp:  [11-32] 16 (07/20 0749) BP: (87-116)/(52-77) 101/60 mmHg (07/20 0749) SpO2:  [90 %-100 %] 94 % (07/20 0749) FiO2 (%):  [21 %] 21 % (07/19 1451) Weight:  [84.823 kg (187 lb)] 84.823 kg (187 lb) (07/19 0925)  Intake/Output from previous day: 07/19 0701 - 07/20 0700 In: 3093.3 [P.O.:940; I.V.:2053.3; IV Piggyback:100] Out: 2000 [Urine:1500; Blood:500] Intake/Output this shift:     Recent Labs  10/31/14 1517  HGB 12.0    Recent Labs  10/31/14 1517  WBC 7.4  RBC 4.02  HCT 36.2  PLT 181    Recent Labs  10/31/14 1517  CREATININE 0.69   No results for input(s): LABPT, INR in the last 72 hours.  EXAM General - Patient is Alert, Appropriate and Oriented Extremity - Neurovascular intact Sensation intact distally Intact pulses distally Dorsiflexion/Plantar flexion intact Dressing - dressing C/D/I and no drainage Motor Function - intact, moving foot and toes well on exam.   Past Medical History  Diagnosis Date  . Hyperlipidemia   . Hypothyroid   . Cardiomyopathy     tachycardia induced. EF 35-40% 06/2008 "patient denies"  . Hypertension   . Abdominal pain     Intermittent pain with firmness, ache, more at night, pain in different quadrants each time  . 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 Complete AV block     s/p pacemaker insertion 1996  . Peripheral artery disease     stent on lt illiac  . Peripheral vascular disease   . Ovarian cancer     ovarian, skin      Assessment/Plan:   1 Day Post-Op Procedure(s) (LRB): TOTAL HIP ARTHROPLASTY ANTERIOR APPROACH (Right) Active Problems:   Primary osteoarthritis of right hip  Estimated body mass index is 31.12 kg/(m^2) as calculated from the following:   Height as of this encounter: 5\' 5"  (1.651 m).   Weight as of this encounter: 84.823 kg (187 lb). Advance diet Up with therapy  Needs BM Recheck labs in the am   DVT Prophylaxis - Lovenox, Foot Pumps and TED hose Weight-Bearing as tolerated to right leg D/C O2 and Pulse OX and try on Room Air  T. Rachelle Hora, PA-C [place] 11/01/2014, 7:52 AM

## 2014-11-01 NOTE — Care Management Note (Signed)
Case Management Note  Patient Details  Name: [name] [name] MRN: [id number] Date of Birth: 06-16-1955  Subjective/Objective:                  Met with patient to discuss discharge planning. Patient was very sleepy after working with PT; complaining of pain but could not keep her eyes open during this assessment. PT is currently recommending SNF. Patient lives with her husband. She agrees to SNF or HHPT. She has no DME at home. She uses [facility] for Rx.   Action/Plan: PT is currently recommending SNF. RNCM left list of home health providers. RNCM will continue to follow.   Expected Discharge Date:                  Expected Discharge Plan:     In-House Referral:  Clinical Social Work  Discharge planning Services  CM Consult  Post Acute Care Choice:    Choice offered to:  Patient  DME Arranged:    DME Agency:     HH Arranged:    [place] Agency:     Status of Service:  In process, will continue to follow  Medicare Important Message Given:    Date Medicare IM Given:    Medicare IM give by:    Date Additional Medicare IM Given:    Additional Medicare Important Message give by:     If discussed at [place] of Stay Meetings, dates discussed:    Additional Comments:  Marshell Garfinkel, RN 11/01/2014, 12:22 PM

## 2014-11-01 NOTE — Evaluation (Signed)
Occupational Therapy Evaluation Patient Details Name: [name] [name] MRN: [id number] DOB: 11-07-1955 Today's Date: 11/01/2014    History of Present Illness This patient is a 59 year old female who came to [facility] for a THA anterior aproach.   Clinical Impression   This patient is a 59 year old female who came to [facility] for a right total hip replacement (anterior approach) .  Patient lives in a 2 story home with husband. She can stay on the first floor.  She had been independent with ADL and functional mobility. She  now requires  assistance for lower body dressing. She would benefit from Occupational Therapy for ADL/functional mobility training while .staying within hip precautions (anterior approach)       Follow Up Recommendations  SNF    Equipment Recommendations       Recommendations for Other Services       Precautions / Restrictions Precautions Precautions: Anterior Hip;Fall Precaution Booklet Issued: Yes (comment) Restrictions Weight Bearing Restrictions: Yes RLE Weight Bearing: Weight bearing as tolerated      Mobility Bed Mobility          Transfers            Balance                                  ADL                                         General ADL Comments: Had been independent and working now needs assist. Practice techniques for lower body dressing using hip kit practiced Donned/doffed socks and pants to knees with moderate assist.     Vision     Perception     Praxis      Pertinent Vitals/Pain      Hand Dominance     Extremity/Trunk Assessment Upper Extremity Assessment Upper Extremity Assessment: Overall WFL for tasks assessed   Lower Extremity Assessment Lower Extremity Assessment: Defer to PT evaluation RLE Deficits / Details: R hip flexion at least 3-/5 d/t pain; R knee flexion/extension at least 3/5; R DF at least 4/5 RLE: Unable to fully assess due to pain LLE  Deficits / Details: [facility]       Communication Communication Communication: No difficulties   Cognition Arousal/Alertness:  (somewhat lathargic but can participate) Behavior During Therapy: Anxious Overall Cognitive Status: Within Functional Limits for tasks assessed       Memory: Decreased recall of precautions             General Comments       Exercises       Shoulder Instructions      Home Living Family/patient expects to be discharged to::  (Patient thinking she may have to go to rehab) Living Arrangements: Spouse/significant other (spouse works)   Type of Home: House Home Access: Stairs to enter Technical brewer of Steps: 2 Entrance Stairs-Rails: None Home Layout: Two level (but can stay on main floor)               Home Equipment: Cane - single point          Prior Functioning/Environment Level of Independence: Independent with assistive device(s)        Comments: Occasional cane use    OT Diagnosis: Generalized weakness;Acute pain  OT Problem List:     OT Treatment/Interventions: Self-care/ADL training    OT Goals(Current goals can be found in the care plan section) Acute Rehab OT Goals Patient Stated Goal: Less pain OT Goal Formulation: With patient Time For Goal Achievement: 11/15/14 Potential to Achieve Goals: Good  OT Frequency: Min 1X/week   Barriers to D/C:            Co-evaluation              End of Session Equipment Utilized During Treatment:  (hip kit)  Activity Tolerance: Patient limited by fatigue Patient left: in chair (with PT)   Time: 1388-7195 OT Time Calculation (min): 20 min Charges:  OT General Charges $OT Visit: 1 Procedure OT Evaluation $Initial OT Evaluation Tier I: 1 Procedure OT Treatments $Self Care/Home Management : 8-22 mins G-Codes:    Myrene Galas, MS/OTR/L  11/01/2014, 1:48 PM

## 2014-11-01 NOTE — Clinical Social Work Note (Signed)
Clinical Social Work Assessment  Patient Details  Name: [name] [name] MRN: [id number] Date of Birth: [birth year]  Date of referral:  11/01/14               Reason for consult:  Facility Placement                Permission sought to share information with:  Chartered certified accountant granted to share information::  Yes, Verbal Permission Granted  Name::      [place]::   [facility]   Relationship::     Contact Information:     Housing/Transportation Living arrangements for the past 2 months:  [place] of Information:  Patient Patient Interpreter Needed:  None Criminal Activity/Legal Involvement Pertinent to Current Situation/Hospitalization:  No - Comment as needed Significant Relationships:  Spouse Lives with:  Spouse Do you feel safe going back to the place where you live?  Yes Need for family participation in patient care:  Yes (Comment)  Care giving concerns: Patient lives with her husband [name] [name] in [place].    Social Worker assessment / plan: [place] representative (CSW) received SNF consult. PT is recommending SNF today. CSW met with patient to discuss D/C plan. Patient reported that she lives in [place] with her husband [name] [name]. Per patient [name] [name] works during the day. CSW explained SNF process. Patient is agreeable to SNF search and prefers [facility]   FL2 complete and faxed out.    Employment status:  Retired Surveyor, [employer] Care PT Recommendations:  [place] / Referral to community resources:  [place]  Patient/Family's Response to care:  Patient is agreeable to [facility] and prefers [facility]   Patient/Family's Understanding of and Emotional Response to Diagnosis, Current Treatment, and Prognosis: Patient was pleasant and thanked CSW for visit.   Emotional Assessment Appearance:  Appears stated age Attitude/Demeanor/Rapport:     Affect (typically observed):  Pleasant, Accepting Orientation:  Oriented to Self, Oriented to Place, Oriented to  Time, Oriented to Situation Alcohol / Substance use:  Not Applicable Psych involvement (Current and /or in the community):  No (Comment)  Discharge Needs  Concerns to be addressed:  Discharge Planning Concerns Readmission within the last 30 days:  No Current discharge risk:  Chronically ill Barriers to Discharge:  Continued Medical Work up   Loralyn Freshwater, LCSW 11/01/2014, 3:31 PM

## 2014-11-01 NOTE — Evaluation (Signed)
Physical Therapy Evaluation Patient Details Name: [name] [name] MRN: [id number] DOB: [birth year] Today's Date: 11/01/2014   History of Present Illness  Pt is a 59 y.o. female s/p R anterior THA secondary to OA 10/31/14.  Clinical Impression  Currently pt demonstrates impairments with R LE strength, pain, balance, and limitations with functional mobility.  Prior to admission, pt was independent with occasional use of SPC.  Pt lives with her husband in 2 story home (pt reports she can stay on main level).  Currently pt is mod assist supine to sit; mod assist sit to/from stand with RW; min assist ambulating a few feet bed to recliner with RW.  Therapy complicated d/t pt's c/o R hip pain (pt pre-medicated prior to PT session and given IV pain medication beginning of PT session).  Pt would benefit from skilled PT to address above noted impairments and functional limitations.  Recommend pt discharge to STR when medically appropriate.     Follow Up Recommendations SNF    Equipment Recommendations  Rolling walker with 5" wheels;3in1 (PT)    Recommendations for Other Services       Precautions / Restrictions Precautions Precautions: Anterior Hip;Fall Precaution Booklet Issued: Yes (comment) Restrictions Weight Bearing Restrictions: Yes RLE Weight Bearing: Weight bearing as tolerated      Mobility  Bed Mobility Overal bed mobility: Needs Assistance Bed Mobility: Supine to Sit     Supine to sit: Mod assist     General bed mobility comments: vc's (and min assist for R LE) scooting to edge of bed on R side (use of B side rails and pushing with L LE); mod assist for trunk supine to sit edge of bed.  Transfers Overall transfer level: Needs assistance Equipment used: Rolling walker (2 wheeled) Transfers: Sit to/from Stand Sit to Stand: Mod assist         General transfer comment: assist to initiate stand and continue standing upright with RW; vc's required for UE and LE placement and  technique for transfers  Ambulation/Gait Ambulation/Gait assistance: Min assist Ambulation Distance (Feet): 3 Feet Assistive device: Rolling walker (2 wheeled)   Gait velocity: decreased   General Gait Details: antalgic; decreased stance time L LE; decreased B foot clearance/heelstrike; decreased step length L>R  Stairs            Wheelchair Mobility    Modified Rankin (Stroke Patients Only)       Balance Overall balance assessment: Needs assistance Sitting-balance support: Bilateral upper extremity supported;Feet supported Sitting balance-Leahy Scale: Good     Standing balance support: Bilateral upper extremity supported Standing balance-Leahy Scale: Fair                               Pertinent Vitals/Pain Pain Assessment: 0-10 Pain Score: 5  (5/10 beginning of session; 2/10 end of session) Pain Location: R hip Pain Descriptors / Indicators: Sharp;Shooting Pain Intervention(s): Limited activity within patient's tolerance;Monitored during session;Premedicated before session;Repositioned (Nursing aide went to get pt ice-pack end of session)  HR 74-77 bpm and O2 >92% on room air during session.    Home Living Family/patient expects to be discharged to:: Private residence Living Arrangements: Spouse/significant other   Type of Home: House Home Access: Stairs to enter Entrance Stairs-Rails: None Entrance Stairs-Number of Steps: 2 Home Layout:  (2nd floor bedroom but pt reports she can stay on main level) Home Equipment: Cane - single point      Prior Function Level  of Independence: Independent with assistive device(s)         Comments: Pt occasionally uses SPC     Hand Dominance        Extremity/Trunk Assessment   Upper Extremity Assessment: Overall WFL for tasks assessed           Lower Extremity Assessment: RLE deficits/detail;LLE deficits/detail RLE Deficits / Details: R hip flexion at least 3-/5 d/t pain; R knee  flexion/extension at least 3/5; R DF at least 4/5 LLE Deficits / Details: [facility]     Communication   Communication: No difficulties  Cognition Arousal/Alertness: Lethargic;Suspect due to medications (pt just given IV pain medication and was falling asleep requiring intermittent vc's to wake up) Behavior During Therapy: Anxious Overall Cognitive Status: Within Functional Limits for tasks assessed       Memory: Decreased recall of precautions              General Comments General comments (skin integrity, edema, etc.): Dressings intact  Nursing cleared pt for participation in physical therapy.  Pt agreeable to PT session but requesting pain medication prior to participating.    Exercises   Performed x10 B LE semi-supine therapeutic exercise:  Quad sets with 3 second holds (AROM R; AROM L); hip Adduction isometrics x3 seconds (pillow between thighs) (AROM B LE's); ankle pumps (AROM B LE's); gluteal sets x3 second holds (AROM B); heelslides (AAROM R; AROM L); SAQ's (AROM R; AROM L); hip abduction/adduction (AAROM R; AROM L); and SLR (AAROM R; AROM L).  Pt required vc's and tactile cues for correct technique with ex's.      Assessment/Plan    PT Assessment Patient needs continued PT services  PT Diagnosis Difficulty walking;Acute pain   PT Problem List Decreased strength;Decreased activity tolerance;Decreased balance;Decreased mobility;Decreased knowledge of use of DME;Decreased knowledge of precautions;Pain  PT Treatment Interventions DME instruction;Gait training;Stair training;Functional mobility training;Therapeutic activities;Therapeutic exercise;Balance training;Patient/family education   PT Goals (Current goals can be found in the Care Plan section) Acute Rehab PT Goals Patient Stated Goal: to have less pain PT Goal Formulation: With patient Time For Goal Achievement: 11/15/14 Potential to Achieve Goals: Good    Frequency BID   Barriers to discharge        Co-evaluation                End of Session Equipment Utilized During Treatment: Gait belt Activity Tolerance: Patient limited by pain Patient left: in chair;with call bell/phone within reach;with chair alarm set;with SCD's reapplied (B heels elevated via towel rolls) Nurse Communication: Mobility status         Time: 4680-3212 PT Time Calculation (min) (ACUTE ONLY): 41 min   Charges:   PT Evaluation $Initial PT Evaluation Tier I: 1 Procedure PT Treatments $Therapeutic Exercise: 8-22 mins   PT G CodesLeitha Bleak 11/04/2014, 10:40 AM Leitha Bleak, [place]

## 2014-11-01 NOTE — Progress Notes (Signed)
Clinical Education officer, museum (CSW) presented bed offers. Patient chose [facility]. Per Kim admissions coordinator at [facility] she will start [facility] authorization today. CSW will continue to follow and assist as needed.   Blima Rich, [place] [phone]

## 2014-11-02 LAB — CBC
HCT: 30.6 % — ABNORMAL LOW (ref 35.0–47.0)
Hemoglobin: 10.3 g/dL — ABNORMAL LOW (ref 12.0–16.0)
MCH: 30.2 pg (ref 26.0–34.0)
MCHC: 33.8 g/dL (ref 32.0–36.0)
MCV: 89.4 fL (ref 80.0–100.0)
Platelets: 149 10*3/uL — ABNORMAL LOW (ref 150–440)
RBC: 3.42 MIL/uL — ABNORMAL LOW (ref 3.80–5.20)
RDW: 15.5 % — ABNORMAL HIGH (ref 11.5–14.5)
WBC: 10.4 10*3/uL (ref 3.6–11.0)

## 2014-11-02 LAB — SURGICAL PATHOLOGY

## 2014-11-02 MED ORDER — OXYCODONE HCL 5 MG PO TABS: 5.0000 mg | ORAL_TABLET | ORAL | Status: DC | PRN

## 2014-11-02 MED ORDER — ENOXAPARIN SODIUM 40 MG/0.4ML ~~LOC~~ SOLN: 40.0000 mg | SUBCUTANEOUS | Status: DC

## 2014-11-02 NOTE — Progress Notes (Signed)
Physical Therapy Treatment Patient Details Name: Nolie D Haisley MRN: [id number] DOB: 06-Jan-1956 Today's Date: 11/02/2014    History of Present Illness This patient is a 59 year old female who came to [facility] for a R THA anterior aproach 10/31/14.    PT Comments    Pt initially in chair; complains of R hip pain. Pt feels fatigued. Nursing assistant in to help with toilet transfer; pt only requires Min a x 1. Moves slowly and apprehensively, but tolerates. Continues with low BP readings; pt notes she is no longer taking BP medicine. Post return to bed, pt tolerates LE exercises with assist as needed. Discussed what to expect with continuing with rehab and procedure to transfer there. Plan to see pt this pm.  Follow Up Recommendations  SNF     Equipment Recommendations  Rolling walker with 5" wheels;3in1 (PT)    Recommendations for Other Services       Precautions / Restrictions Precautions Precautions: Anterior Hip;Fall Restrictions Weight Bearing Restrictions: Yes RLE Weight Bearing: Weight bearing as tolerated    Mobility  Bed Mobility Overal bed mobility: Needs Assistance Bed Mobility: Supine to Sit       Sit to supine: Min assist (Primarily RLE)      Transfers Overall transfer level: Needs assistance Equipment used: Rolling walker (2 wheeled) Transfers: Sit to/from Stand Sit to Stand: Min assist Stand pivot transfers: Min assist       General transfer comment: attempts steps; tends to slide feet  Ambulation/Gait                 Stairs            Wheelchair Mobility    Modified Rankin (Stroke Patients Only)       Balance                                    Cognition Arousal/Alertness: Awake/alert Behavior During Therapy: WFL for tasks assessed/performed Overall Cognitive Status: Within Functional Limits for tasks assessed                      Exercises      General Comments        Pertinent Vitals/Pain Pain  Assessment: 0-10 Pain Score: 4  Pain Location: R hip Pain Descriptors / Indicators:  ("real sore") Pain Intervention(s): Limited activity within patient's tolerance    Home Living                      Prior Function            PT Goals (current goals can now be found in the care plan section) Progress towards PT goals: Progressing toward goals    Frequency  BID    PT Plan Current plan remains appropriate    Co-evaluation             End of Session Equipment Utilized During Treatment: Gait belt Activity Tolerance: Patient limited by fatigue;No increased pain (Low BP) Patient left: in bed;with call bell/phone within reach;with bed alarm set     Time: 7353-2992 PT Time Calculation (min) (ACUTE ONLY): 44 min  Charges:  $Therapeutic Exercise: 8-22 mins $Therapeutic Activity: 8-22 mins                    G Codes:      Charlaine Dalton 11/02/2014, 10:41 AM

## 2014-11-02 NOTE — Progress Notes (Signed)
Per Maudie Mercury admissions coordinator at [facility] authorization has been received. Plan is for patient to D/C to [facility] tomorrow 11/03/14. Patient is aware of above. CSW will continue to follow and assist as needed.   Blima Rich, [place] [phone]

## 2014-11-02 NOTE — Progress Notes (Signed)
Per Maudie Mercury admissions coordinator at [facility] authorization is still pending. [facility] is attempting to negotiate a rate with [employer]. Clinical Social Worker (CSW) will continue to follow and assist as needed.   Blima Rich, [place] [phone]

## 2014-11-02 NOTE — Progress Notes (Signed)
   Subjective: 2 Days Post-Op Procedure(s) (LRB): TOTAL HIP ARTHROPLASTY ANTERIOR APPROACH (Right) Patient reports pain as mild.   Patient is well, and has had no acute complaints or problems. Hypotensive with dizziness yesterday. We will continue with therapy today.  Plan is to go Rehab after hospital stay.  Objective: Vital signs in last 24 hours: Temp:  [98.7 F (37.1 C)-99.3 F (37.4 C)] 98.7 F (37.1 C) (07/21 0605) Pulse Rate:  [70-78] 78 (07/21 0605) Resp:  [16-18] 18 (07/21 0605) BP: (95-117)/(44-72) 95/51 mmHg (07/21 0605) SpO2:  [92 %-99 %] 97 % (07/21 0605)  Intake/Output from previous day: 07/20 0701 - 07/21 0700 In: 2936.7 [I.V.:2936.7] Out: 1000 [Urine:1000] Intake/Output this shift:     Recent Labs  10/31/14 1517 11/02/14 0655  HGB 12.0 10.3*    Recent Labs  10/31/14 1517 11/02/14 0655  WBC 7.4 10.4  RBC 4.02 3.42*  HCT 36.2 30.6*  PLT 181 149*    Recent Labs  10/31/14 1517  CREATININE 0.69   No results for input(s): LABPT, INR in the last 72 hours.  EXAM General - Patient is Alert, Appropriate and Oriented Extremity - Neurovascular intact Sensation intact distally Intact pulses distally Dorsiflexion/Plantar flexion intact No cellulitis present Dressing - dressing C/D/I Motor Function - intact, moving foot and toes well on exam.   Past Medical History  Diagnosis Date  . Hyperlipidemia   . Hypothyroid   . Cardiomyopathy     tachycardia induced. EF 35-40% 06/2008 "patient denies"  . Hypertension   . Abdominal pain     Intermittent pain with firmness, ache, more at night, pain in different quadrants each time  . 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 Complete AV block     s/p pacemaker insertion 1996  . Peripheral artery disease     stent on lt illiac  . Peripheral vascular disease   . Ovarian cancer     ovarian, skin     Assessment/Plan:   2  Days Post-Op Procedure(s) (LRB): TOTAL HIP ARTHROPLASTY ANTERIOR APPROACH (Right) Active Problems:   Primary osteoarthritis of right hip   Acute post op blood loss anemia   Estimated body mass index is 31.12 kg/(m^2) as calculated from the following:   Height as of this encounter: 5\' 5"  (1.651 m).   Weight as of this encounter: 84.823 kg (187 lb). Advance diet Up with therapy  Needs BM before discharge Hold BP meds today, increase fluids, continue to monitor BP  DVT Prophylaxis - Lovenox, Foot Pumps and TED hose Weight-Bearing as tolerated to right leg D/C O2 and Pulse OX and try on Room Air  T. Rachelle Hora, PA-C [place] 11/02/2014, 7:25 AM

## 2014-11-02 NOTE — Progress Notes (Signed)
Physical Therapy Treatment Patient Details Name: [name] [name] MRN: [id number] DOB: [birth year] Today's Date: 11/02/2014    History of Present Illness This patient is a 59 year old female who came to [facility] for a R THA anterior aproach 10/31/14.    PT Comments    Pt reports fatigue/lethargy due to pain medications. Pt agreeable to PT and requests bedside commode. Offered ambulation to bathroom; pt agreeable to try. Pt demonstrates ability to ambulate; however, continues to have difficulty advancing RLE. Encouraged and provided tactile assist for second and third walk with improvement. BUEs fatigue as well due to heavy lean on rolling walker. Reviewed exercises with pt and family and encouraged to continue; pt notes she is compliant. Pt concerned with up in chair due to pain she experienced this a.m. Placed pillow for pt to sit upon and for LB with subjective comfort. Pt encouraged to tolerate sitting in chair, but reminded to call nursing if she does become uncomfortable and before untolerable. Pt understands  Follow Up Recommendations  SNF     Equipment Recommendations  Rolling walker with 5" wheels;3in1 (PT)    Recommendations for Other Services       Precautions / Restrictions Precautions Precautions: Anterior Hip;Fall Restrictions Weight Bearing Restrictions: Yes RLE Weight Bearing: Weight bearing as tolerated    Mobility  Bed Mobility Overal bed mobility: Needs Assistance Bed Mobility: Supine to Sit     Supine to sit: Min assist     General bed mobility comments:  (; assist for RLE)  Transfers Overall transfer level: Needs assistance Equipment used: Rolling walker (2 wheeled) Transfers: Sit to/from Stand Sit to Stand: Min guard            Ambulation/Gait Ambulation/Gait assistance: Min assist Ambulation Distance (Feet): 20 Feet (then 2 x 25 ft) Assistive device: Rolling walker (2 wheeled) Gait Pattern/deviations: Step-through pattern;Decreased step length -  right;Decreased stance time - right;Decreased stride length;Decreased dorsiflexion - right;Decreased weight shift to right Gait velocity: decreased Gait velocity interpretation: <1.8 ft/sec, indicative of risk for recurrent falls General Gait Details: Requires tactile assist on RLE to clear RLE during swing phase    Stairs            Wheelchair Mobility    Modified Rankin (Stroke Patients Only)       Balance                                    Cognition Arousal/Alertness: Lethargic (pt feels due to pain medication) Behavior During Therapy: WFL for tasks assessed/performed Overall Cognitive Status: Within Functional Limits for tasks assessed       Memory: Decreased recall of precautions              Exercises Other Exercises Other Exercises: Reviewed exercises verbally with pt to continue throughout the day. Family educated as well    General Comments        Pertinent Vitals/Pain Pain Assessment: 0-10 Pain Score: 5  Pain Location: R hip Pain Intervention(s): Monitored during session;Premedicated before session    Home Living                      Prior Function            PT Goals (current goals can now be found in the care plan section) Progress towards PT goals: Progressing toward goals    Frequency  BID  PT Plan Current plan remains appropriate    Co-evaluation             End of Session Equipment Utilized During Treatment: Gait belt Activity Tolerance: Patient limited by fatigue;No increased pain Patient left: in chair;with call bell/phone within reach;with chair alarm set;with family/visitor present (Pt requested to keep foot pumps off)     Time: 9024-0973 PT Time Calculation (min) (ACUTE ONLY): 41 min  Charges:  $Gait Training: 8-22 mins $Therapeutic Exercise: 8-22 mins $Therapeutic Activity: 8-22 mins                    G Codes:      Charlaine Dalton 11/02/2014, 2:28 PM

## 2014-11-02 NOTE — Progress Notes (Signed)
Occupational Therapy Treatment Patient Details Name: Haydn D Marcoux MRN: [id number] DOB: 1956-01-09 Today's Date: 11/02/2014    History of present illness This patient is a 59 year old female who came to [facility] for a R THA anterior aproach 10/31/14.   OT comments  Patient declined actual dressing. Reviewed hip kit and discussed bathroom situation at home.  Follow Up Recommendations       Equipment Recommendations       Recommendations for Other Services      Precautions / Restrictions Precautions Precautions: Anterior Hip;Fall Restrictions Weight Bearing Restrictions: Yes RLE Weight Bearing: Weight bearing as tolerated       Mobility Bed Mobility    Balance                                                                                                               ADL  Patient declined dressing activity. Reviewed hip precautions and hip kit.                                               Vision                     Perception     Praxis      Cognition   Behavior During Therapy: WFL for tasks assessed/performed Overall Cognitive Status: Within Functional Limits for tasks assessed                       Extremity/Trunk Assessment               Exercises   Shoulder Instructions       General Comments      Pertinent Vitals/ Pain         Home Living                                          Prior Functioning/Environment              Frequency       Progress Toward Goals  OT Goals(current goals can now be found in the care plan section)        Plan      Co-evaluation                 End of Session     Activity Tolerance     Patient Left     Nurse Communication          Time: 1131-1140 OT Time Calculation (min): 9 min  Charges: OT General Charges $OT Visit: 1 Procedure OT Treatments $Self Care/Home Management : 8-22 mins  Myrene Galas, MS/OTR/L  11/02/2014, 1:50 PM

## 2014-11-02 NOTE — Discharge Instructions (Signed)

## 2014-11-02 NOTE — Discharge Summary (Signed)
Physician Discharge Summary  Patient ID: [name] [name] MRN: [id number] DOB/AGE: [birth year] 59 y.o.  Admit date: 10/31/2014 Discharge date: 11/02/2014  Admission Diagnoses:  OA Right hip   Discharge Diagnoses: Patient Active Problem List   Diagnosis Date Noted  . Primary osteoarthritis of right hip 10/31/2014  . LV dysfunction 10/27/2014  . 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 Peripheral artery disease   . Abdominal pain   . ATHEROSCLEROSIS W /INT CLAUDICATION 03/18/2010  . PACEMAKER, PERMANENT 11/29/2009  . NEOPLASM, MALIGNANT, OVARY 10/30/2008  . HYPERLIPIDEMIA-MIXED 06/23/2008  . 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 AV BLOCK, COMPLETE 06/23/2008    Past Medical History  Diagnosis Date  . Hyperlipidemia   . Hypothyroid   . Cardiomyopathy     tachycardia induced. EF 35-40% 06/2008 "patient denies"  . Hypertension   . Abdominal pain     Intermittent pain with firmness, ache, more at night, pain in different quadrants each time  . 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 Complete AV block     s/p pacemaker insertion 1996  . Peripheral artery disease     stent on lt illiac  . Peripheral vascular disease   . Ovarian cancer     ovarian, skin      Transfusion: none   Consultants (if any):    Discharged Condition: Improved  Hospital Course: [name] [name] is an 59 y.o. female who was admitted 10/31/2014 with a diagnosis of <principal problem not specified> and went to the operating room on 10/31/2014 and underwent the above named procedures.    Surgeries: Procedure(s): TOTAL HIP ARTHROPLASTY ANTERIOR APPROACH on 10/31/2014 Patient tolerated the surgery well. Taken to PACU where she was stabilized and then transferred to the orthopedic floor.  Started on Lovenox 30 q 12 hrs. Foot pumps applied bilaterally at 80  mm. Heels elevated on bed with rolled towels. No evidence of DVT. Negative Homan. Physical therapy started on day #1 for gait training and transfer. OT started day #1 for ADL and assisted devices.  Patient's foley was d/c on day #1. Patient's IV and hemovac was d/c on day #2.  On post op day #3 patient was stable and ready for discharge to SNF.  Implants: Medacta AMIS 3 STEM, 52 MM Mpact cup DM, with liner and M 28 mm head  She was given perioperative antibiotics:  Anti-infectives    Start     Dose/Rate Route Frequency Ordered Stop   10/31/14 1600  ceFAZolin (ANCEF) IVPB 2 g/50 mL premix     2 g 100 mL/hr over 30 Minutes Intravenous Every 6 hours 10/31/14 1450 11/01/14 0430   10/31/14 0955  ceFAZolin (ANCEF) 2-3 GM-% IVPB SOLR    Comments:  Machia, Millissa: cabinet override      10/31/14 0955 10/31/14 2159   10/31/14 0945  ceFAZolin (ANCEF) IVPB 2 g/50 mL premix     2 g 100 mL/hr over 30 Minutes Intravenous  Once 10/31/14 0930 10/31/14 1202    .  She was given sequential compression devices, early ambulation, and lovenox for DVT prophylaxis.  She benefited maximally from the hospital stay and there were no complications.    Recent vital signs:  Filed Vitals:   11/02/14 0743  BP: 120/62  Pulse: 76  Temp: 99.8 F (37.7 C)  Resp: 16    Recent laboratory studies:  Lab Results  Component Value Date  HGB 10.3* 11/02/2014   HGB 12.0 10/31/2014   HGB 13.3 10/18/2014   Lab Results  Component Value Date   WBC 10.4 11/02/2014   PLT 149* 11/02/2014   Lab Results  Component Value Date   INR 0.92 10/18/2014   Lab Results  Component Value Date   NA 136 10/18/2014   K 4.2 10/18/2014   CL 98* 10/18/2014   CO2 29 10/18/2014   BUN 8 10/18/2014   CREATININE 0.69 10/31/2014   GLUCOSE 94 10/18/2014    Discharge Medications:     Medication List    TAKE these medications        aspirin 81 MG tablet  Take 81 mg by mouth daily.     benazepril 10 MG tablet  Commonly  known as:  LOTENSIN  Take 10 mg by mouth daily.     clonazePAM 0.5 MG tablet  Commonly known as:  KLONOPIN  Take 0.5 mg by mouth daily as needed for anxiety.     enoxaparin 40 MG/0.4ML injection  Commonly known as:  LOVENOX  Inject 0.4 mLs (40 mg total) into the skin daily.     levothyroxine 100 MCG tablet  Commonly known as:  SYNTHROID, LEVOTHROID  Take 100 mcg by mouth daily.     lovastatin 20 MG tablet  Commonly known as:  MEVACOR  Take 1 tablet (20 mg total) by mouth at bedtime.     meloxicam 15 MG tablet  Commonly known as:  MOBIC  Take 15 mg by mouth daily.     metoprolol 50 MG tablet  Commonly known as:  LOPRESSOR  Take 75 mg by mouth 2 (two) times daily.     MULTIVITAMIN ADULT PO  Take 1 tablet by mouth daily.     oxyCODONE 5 MG immediate release tablet  Commonly known as:  Oxy IR/ROXICODONE  Take 1-2 tablets (5-10 mg total) by mouth every 3 (three) hours as needed for breakthrough pain.     ranitidine 150 MG capsule  Commonly known as:  ZANTAC  Take 150 mg by mouth 2 (two) times daily as needed for heartburn.        Diagnostic Studies: Nm Myocar Multi W/spect W/wall Motion / Ef  10/20/2014    Defect 1: There is a medium defect of severe severity present in the  apical inferior and apex location.  Findings consistent with prior myocardial infarction. No reversibility.  The left ventricular ejection fraction is moderately decreased (30-44%).  This is an intermediate risk study.    Dg Hip Operative Unilat With Pelvis Right  10/31/2014   CLINICAL DATA:  History of right total hip joint replacement  EXAM: OPERATIVE right HIP (WITH PELVIS IF PERFORMED)  VIEWS  TECHNIQUE: Fluoroscopic spot image was submitted for interpretation post-operatively.  COMPARISON:  Abdominal pelvic CT scan of October 20, 2013.  FINDINGS: A single fluoro spot film is submitted. Fluoro time was reported at 36 seconds. A right hip prosthesis is in place. Radiographic positioning of the prosthetic  components is good where visualized. The native bone exhibits no acute abnormality.  IMPRESSION: Single fluoro spot image revealing interval placement of a prosthetic right hip joint without evidence of immediate postprocedure complication. The inferior portion of the femoral component of the prosthesis is not included in the field of view.   Electronically Signed   By: David  Martinique M.D.   On: 10/31/2014 13:31   Dg Hip Unilat W Or W/o Pelvis 2-3 Views Right  10/31/2014   CLINICAL DATA:  Status post total hip replacement  EXAM: DG HIP 2-3V RIGHT  COMPARISON:  None.  FINDINGS: Frontal and lateral views of the right hip were obtained. There is a total hip prosthesis on the right which appears well seated. No acute fracture or dislocation. No erosive change.  IMPRESSION: Right total hip prosthesis appears well-seated. No acute fracture or dislocation.   Electronically Signed   By: Lowella Grip III M.D.   On: 10/31/2014 14:22    Disposition: Stable and ready for discharge to SNF facility.        Follow-up Information    Follow up with MENZ,MICHAEL, MD. Call in 2 weeks.   Specialty:  Orthopedic Surgery   Why:  For staple removal and skin check   Contact information:   [place] [address] [phone]        Signed: Dorise Hiss [facility] 11/02/2014, 10:15 AM

## 2014-11-03 NOTE — Progress Notes (Signed)
Patient is medically stable for D/C to [facility] today. Per Kim admissions coordinator at [facility] patient is going to room 204-B. RN will call report at [phone] and arrange EMS for transport. Clinical Education officer, museum (CSW) prepared D/C packet and sent D/C summary to [employer] via carefinder. BCBS authorization has been received. Patient reported that she would call her husband and make him aware of above. Please reconsult if future social work needs arise. CSW signing off.   Blima Rich, [place] [phone]

## 2014-11-03 NOTE — Progress Notes (Signed)
Physical Therapy Treatment Patient Details Name: [name] [name] MRN: [id number] DOB: 05-20-1955 Today's Date: 11/03/2014    History of Present Illness This patient is a 59 year old female who came to [facility] for a R THA anterior aproach 10/31/14.    PT Comments    Pt gradually progressing with ambulation distance (45 feet with RW this morning) but distance limited d/t UE fatigue on RW; pt requiring multiple standing rest breaks and demonstrated significant decreased cadence.  Pt requires vc's for improving gait technique and safety with mobility.  Plan for discharge to STR today.  Follow Up Recommendations  SNF     Equipment Recommendations  Rolling walker with 5" wheels;3in1 (PT)    Recommendations for Other Services       Precautions / Restrictions Precautions Precautions: Anterior Hip;Fall Restrictions Weight Bearing Restrictions: Yes RLE Weight Bearing: Weight bearing as tolerated    Mobility  Bed Mobility               General bed mobility comments: Pt received sitting up in recliner and sat in recliner end of session.  Transfers Overall transfer level: Needs assistance Equipment used: Rolling walker (2 wheeled) Transfers: Sit to/from Stand Sit to Stand: Min guard         General transfer comment: vc's required for safety and hand placement  Ambulation/Gait Ambulation/Gait assistance: Min guard Ambulation Distance (Feet): 45 Feet Assistive device: Rolling walker (2 wheeled)   Gait velocity: decreased Gait velocity interpretation: <1.8 ft/sec, indicative of risk for recurrent falls General Gait Details: Step to pattern, difficulty clearing and advancing R LE; decreased R step length and foot clearance; decreased weight shift to R; significant decreased cadence; limited distance d/t fatigue   Stairs            Wheelchair Mobility    Modified Rankin (Stroke Patients Only)       Balance                                     Cognition Arousal/Alertness: Awake/alert Behavior During Therapy: WFL for tasks assessed/performed Overall Cognitive Status: Within Functional Limits for tasks assessed       Memory: Decreased recall of precautions              Exercises   Performed sitting exercises x 15 reps B LE's:  Heel/toe raises (AROM R; AROM L); LAQ's (AROM R; AROM L); marching/hip flexion (AAROM R; AROM L).  Pt required vc's and tactile cues for correct technique with exercises.     General Comments   Nursing cleared pt for participation in physical therapy.  Pt agreeable to PT session.       Pertinent Vitals/Pain Pain Assessment: 0-10 Pain Score: 6  Pain Location: R hip Pain Descriptors / Indicators: Sore Pain Intervention(s): Limited activity within patient's tolerance;Monitored during session;Premedicated before session;Repositioned;Ice applied  Vitals stable and WFL throughout treatment session.    Home Living                      Prior Function            PT Goals (current goals can now be found in the care plan section) Acute Rehab PT Goals Patient Stated Goal: Less pain PT Goal Formulation: With patient Time For Goal Achievement: 11/15/14 Potential to Achieve Goals: Good Progress towards PT goals: Progressing toward goals    Frequency  BID  PT Plan Current plan remains appropriate    Co-evaluation             End of Session Equipment Utilized During Treatment: Gait belt Activity Tolerance: Patient limited by fatigue;No increased pain Patient left: in chair;with call bell/phone within reach;with chair alarm set (pt refused SCD's; B heels elevated via towel rolls)     Time: 1010-1033 PT Time Calculation (min) (ACUTE ONLY): 23 min  Charges:  $Gait Training: 8-22 mins $Therapeutic Exercise: 8-22 mins                    G CodesLeitha Bleak November 10, 2014, 11:26 AM Leitha Bleak, [place]

## 2014-11-03 NOTE — Progress Notes (Signed)
   Subjective: 3 Days Post-Op Procedure(s) (LRB): TOTAL HIP ARTHROPLASTY ANTERIOR APPROACH (Right) Patient reports pain as mild.   Patient is well, and has had no acute complaints or problems. We will continue with therapy today.  Plan is to go Rehab today  Objective: Vital signs in last 24 hours: Temp:  [98.5 F (36.9 C)-99.8 F (37.7 C)] 99.5 F (37.5 C) (07/22 0451) Pulse Rate:  [69-77] 73 (07/22 0451) Resp:  [16-20] 18 (07/22 0451) BP: (96-118)/(48-64) 96/51 mmHg (07/22 0451) SpO2:  [94 %-98 %] 94 % (07/22 0451)  Intake/Output from previous day: 07/21 0701 - 07/22 0700 In: 650 [P.O.:650] Out: 1200 [Urine:1200] Intake/Output this shift: Total I/O In: -  Out: 200 [Urine:200]   Recent Labs  10/31/14 1517 11/02/14 0655  HGB 12.0 10.3*    Recent Labs  10/31/14 1517 11/02/14 0655  WBC 7.4 10.4  RBC 4.02 3.42*  HCT 36.2 30.6*  PLT 181 149*    Recent Labs  10/31/14 1517  CREATININE 0.69   No results for input(s): LABPT, INR in the last 72 hours.  EXAM General - Patient is Alert, Appropriate and Oriented Extremity - Neurovascular intact Sensation intact distally Intact pulses distally Dorsiflexion/Plantar flexion intact No cellulitis present Dressing - dressing C/D/I, scant serous drainage Motor Function - intact, moving foot and toes well on exam.   Past Medical History  Diagnosis Date  . Hyperlipidemia   . Hypothyroid   . Cardiomyopathy     tachycardia induced. EF 35-40% 06/2008 "patient denies"  . Hypertension   . Abdominal pain     Intermittent pain with firmness, ache, more at night, pain in different quadrants each time  . 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 Complete AV block     s/p pacemaker insertion 1996  . Peripheral artery disease     stent on lt illiac  . Peripheral vascular disease   . Ovarian cancer     ovarian, skin     Assessment/Plan:   3  Days Post-Op Procedure(s) (LRB): TOTAL HIP ARTHROPLASTY ANTERIOR APPROACH (Right) Active Problems:   Primary osteoarthritis of right hip   Acute post op blood loss anemia   Estimated body mass index is 31.12 kg/(m^2) as calculated from the following:   Height as of this encounter: 5\' 5"  (1.651 m).   Weight as of this encounter: 84.823 kg (187 lb). Advance diet Up with therapy  Discharge to rehab today pending BM Follow up with [facility] in 2 weeks  DVT Prophylaxis - Lovenox, Foot Pumps and TED hose Weight-Bearing as tolerated to right leg D/C O2 and Pulse OX and try on Room Air  T. Rachelle Hora, PA-C [place] 11/03/2014, 8:00 AM

## 2014-11-03 NOTE — Progress Notes (Signed)
Report called to [facility]  at [facility]. IV removed, VSS. Pt transported via EMS.

## 2014-11-03 NOTE — Clinical Social Work Placement (Signed)
   CLINICAL SOCIAL WORK PLACEMENT  NOTE  Date:  11/03/2014  Patient Details  Name: [name] [name] MRN: [id number] Date of Birth: [birth year]  Clinical Social Work is seeking post-discharge placement for this patient at the [place] level of care (*CSW will initial, date and re-position this form in  chart as items are completed):  Yes   Patient/family provided with [facility] Work Department's list of facilities offering this level of care within the geographic area requested by the patient (or if unable, by the patient's family).  Yes   Patient/family informed of their freedom to choose among providers that offer the needed level of care, that participate in Medicare, Medicaid or managed care program needed by the patient, have an available bed and are willing to accept the patient.  Yes   Patient/family informed of [facility]'s ownership interest in [facility] and [facility], as well as of the fact that they are under no obligation to receive care at these facilities.  PASRR submitted to EDS on 11/01/14     PASRR number received on 11/01/14     Existing PASRR number confirmed on       FL2 transmitted to all facilities in geographic area requested by pt/family on 11/01/14     FL2 transmitted to all facilities within larger geographic area on       Patient informed that his/her managed care company has contracts with or will negotiate with certain facilities, including the following:            Patient/family informed of bed offers received.  Patient chooses bed at  [facility] )     Physician recommends and patient chooses bed at      Patient to be transferred to  [facility] ) on 11/03/14.  Patient to be transferred to facility by  [facility] )     Patient family notified on 11/03/14 of transfer.  Name of family member notified:   (Husband aware. )     PHYSICIAN       Additional Comment:     _______________________________________________ Loralyn Freshwater, LCSW 11/03/2014, 10:20 AM

## 2014-11-10 NOTE — Addendum Note (Signed)
Addendum  created 11/10/14 1633 by Iver Nestle, MD   Modules edited: Anesthesia Events, Narrator   Narrator:  Narrator: Event Log Edited

## 2014-11-13 ENCOUNTER — Other Ambulatory Visit: Payer: Self-pay

## 2014-11-13 ENCOUNTER — Telehealth: Payer: Self-pay | Admitting: Internal Medicine

## 2014-11-13 MED ORDER — LEVOTHYROXINE SODIUM 100 MCG PO TABS
100.0000 ug | ORAL_TABLET | Freq: Every day | ORAL | Status: DC
Start: 2014-11-13 — End: 2015-01-15

## 2014-11-13 NOTE — Telephone Encounter (Signed)
Follow Up  Pt called req a call back to discuss the 08/24 Surgery in detail. Is there any med's to be held. What time should she be there. What should she expect. Please call.

## 2014-11-13 NOTE — Telephone Encounter (Signed)
Spoke with patient and she is scheduled for 12/06/14  To be at the hospital at 6:30am NPO after midnight.  She is aware

## 2014-11-15 ENCOUNTER — Telehealth: Payer: Self-pay | Admitting: Unknown Physician Specialty

## 2014-11-15 NOTE — Telephone Encounter (Signed)
Pt called requests call back from [place] regarding her BP medications. Thanks.

## 2014-11-15 NOTE — Telephone Encounter (Signed)
Called and spoke to patient who stated that at rehab, they took her off her bp medications because they said her bp was low. Patient stated that before she left, her bp was stabilized. Patient was instructed to call her PCP and patient thinks she should be taking them. She wants to know what she should do.

## 2014-11-16 NOTE — Telephone Encounter (Signed)
Called and let patient know what Malachy Mood said and suggested.

## 2014-11-16 NOTE — Telephone Encounter (Signed)
Called and spoke to patient. Patient stated she has no way to check her BP at home but went to physical therapy this morning and it was 110/70. Patient stated she has not been on the medication for 2 weeks now. Patient stated she would keep a check on it as much as she could. Does the patient still need to schedule an appointment?

## 2014-11-16 NOTE — Telephone Encounter (Signed)
Please let her know to stay off her BP medication for now.  It might be a good idea to buy a cuff from the drugstore that goes around her arm (not wrist!).  We will not start her BP medication until her BP is higher, at least over 130/80

## 2014-11-16 NOTE — Telephone Encounter (Signed)
Please ask patient if she is checking her BP at home, if now, she should come in for an appointment and BP check.  If she is, please ask what numbers she is getting at home.

## 2014-11-27 ENCOUNTER — Other Ambulatory Visit: Payer: Self-pay | Admitting: Unknown Physician Specialty

## 2014-11-27 MED ORDER — CLONAZEPAM 0.5 MG PO TABS: 0.5000 mg | ORAL_TABLET | Freq: Every day | ORAL | Status: DC | PRN

## 2014-11-27 NOTE — Telephone Encounter (Signed)
Patient was last seen on 06/07/14, practice partner number is [phone], and [facility] [address].

## 2014-11-27 NOTE — Telephone Encounter (Signed)
Pt called stated she needs a refill on Klonopin. [facility] [place]. Thanks.

## 2014-11-27 NOTE — Telephone Encounter (Signed)
Called and let patient know that rx was ready to pick up at the front desk.

## 2014-11-28 ENCOUNTER — Encounter (HOSPITAL_COMMUNITY): Payer: Self-pay

## 2014-11-28 ENCOUNTER — Encounter (HOSPITAL_COMMUNITY)
Admission: RE | Admit: 2014-11-28 | Discharge: 2014-11-28 | Disposition: A | Discharge status: Home / Self Care | Payer: BLUE CROSS/BLUE SHIELD | Source: Ambulatory Visit | Attending: Internal Medicine | Admitting: Internal Medicine

## 2014-11-28 ENCOUNTER — Other Ambulatory Visit: Payer: Self-pay | Admitting: Nurse Practitioner

## 2014-11-28 ENCOUNTER — Ambulatory Visit: Payer: BLUE CROSS/BLUE SHIELD | Admitting: Physician Assistant

## 2014-11-28 DIAGNOSIS — Z87891 Personal history of nicotine dependence: Secondary | ICD-10-CM | POA: Insufficient documentation

## 2014-11-28 DIAGNOSIS — Z8543 Personal history of malignant neoplasm of ovary: Secondary | ICD-10-CM | POA: Diagnosis not present

## 2014-11-28 DIAGNOSIS — I442 Atrioventricular block, complete: Secondary | ICD-10-CM | POA: Diagnosis not present

## 2014-11-28 DIAGNOSIS — Z86718 Personal history of other venous thrombosis and embolism: Secondary | ICD-10-CM | POA: Diagnosis not present

## 2014-11-28 DIAGNOSIS — Z01818 Encounter for other preprocedural examination: Secondary | ICD-10-CM | POA: Diagnosis not present

## 2014-11-28 DIAGNOSIS — E039 Hypothyroidism, unspecified: Secondary | ICD-10-CM | POA: Insufficient documentation

## 2014-11-28 DIAGNOSIS — Z950 Presence of cardiac pacemaker: Secondary | ICD-10-CM | POA: Insufficient documentation

## 2014-11-28 DIAGNOSIS — E785 Hyperlipidemia, unspecified: Secondary | ICD-10-CM | POA: Diagnosis not present

## 2014-11-28 DIAGNOSIS — Z96641 Presence of right artificial hip joint: Secondary | ICD-10-CM | POA: Insufficient documentation

## 2014-11-28 DIAGNOSIS — K219 Gastro-esophageal reflux disease without esophagitis: Secondary | ICD-10-CM | POA: Insufficient documentation

## 2014-11-28 HISTORY — DX: Personal history of colon polyps, unspecified: Z86.0100

## 2014-11-28 HISTORY — DX: Anxiety disorder, unspecified: F41.9

## 2014-11-28 HISTORY — DX: Anesthesia of skin: R20.0

## 2014-11-28 HISTORY — DX: Unspecified osteoarthritis, unspecified site: M19.90

## 2014-11-28 HISTORY — DX: Dorsalgia, unspecified: M54.9

## 2014-11-28 HISTORY — DX: Personal history of other venous thrombosis and embolism: Z86.718

## 2014-11-28 HISTORY — DX: Personal history of colonic polyps: Z86.010

## 2014-11-28 HISTORY — DX: Pain in unspecified joint: M25.50

## 2014-11-28 HISTORY — DX: Headache, unspecified: R51.9

## 2014-11-28 HISTORY — DX: Gastro-esophageal reflux disease without esophagitis: K21.9

## 2014-11-28 HISTORY — DX: Headache: R51

## 2014-11-28 HISTORY — DX: Anesthesia of skin: R20.2

## 2014-11-28 HISTORY — DX: Dizziness and giddiness: R42

## 2014-11-28 HISTORY — DX: Effusion, unspecified joint: M25.40

## 2014-11-28 LAB — SURGICAL PCR SCREEN
MRSA, PCR: NEGATIVE
Staphylococcus aureus: NEGATIVE

## 2014-11-28 NOTE — Pre-Procedure Instructions (Signed)
[name] [name]  11/28/2014      [id number] [place], [place] [address] [place] [address] Phone: [phone] Fax: [phone]    Your procedure is scheduled on Wed, Aug 24 @ 8:30 AM  Report to [facility] Admitting at 6:30 AM.  Call this number if you have problems the morning of surgery:  [phone]   Remember:  Do not eat food or drink liquids after midnight.  Take these medicines the morning of surgery with A SIP OF WATER Klonopin(Clonazepam),Synthroid(Levothyroxine),Pain Pill(if needed),and Ranitidine(Zantac)              Stop taking your Mobic and Aspirin. No Goody's,BC's,Aleve,Ibuprofen,Fish Oil,or any Herbal Medications.    Do not wear jewelry, make-up or nail polish.  Do not wear lotions, powders, or perfumes.    Do not shave 48 hours prior to surgery.   Do not bring valuables to the hospital.  [facility] is not responsible for any belongings or valuables.  Contacts, dentures or bridgework may not be worn into surgery.  Leave your suitcase in the car.  After surgery it may be brought to your room.  For patients admitted to the hospital, discharge time will be determined by your treatment team.  Patients discharged the day of surgery will not be allowed to drive home.    Special instructions:  [facility] - Preparing for Surgery  Before surgery, you can play an important role.  Because skin is not sterile, your skin needs to be as free of germs as possible.  You can reduce the number of germs on you skin by washing with CHG (chlorahexidine gluconate) soap before surgery.  CHG is an antiseptic cleaner which kills germs and bonds with the skin to continue killing germs even after washing.  Please DO NOT use if you have an allergy to CHG or antibacterial soaps.  If your skin becomes reddened/irritated stop using the CHG and inform your nurse when you arrive at Short Stay.  Do not shave (including legs and underarms) for at  least 48 hours prior to the first CHG shower.  You may shave your face.  Please follow these instructions carefully:   1.  Shower with CHG Soap the night before surgery and the                                morning of Surgery.  2.  If you choose to wash your hair, wash your hair first as usual with your       normal shampoo.  3.  After you shampoo, rinse your hair and body thoroughly to remove the                      Shampoo.  4.  Use CHG as you would any other liquid soap.  You can apply chg directly       to the skin and wash gently with scrungie or a clean washcloth.  5.  Apply the CHG Soap to your body ONLY FROM THE NECK DOWN.        Do not use on open wounds or open sores.  Avoid contact with your eyes,       ears, mouth and genitals (private parts).  Wash genitals (private parts)       with your normal soap.  6.  Wash thoroughly, paying special attention  to the area where your surgery        will be performed.  7.  Thoroughly rinse your body with warm water from the neck down.  8.  DO NOT shower/wash with your normal soap after using and rinsing off       the CHG Soap.  9.  Pat yourself dry with a clean towel.            10.  Wear clean pajamas.            11.  Place clean sheets on your bed the night of your first shower and do not        sleep with pets.  Day of Surgery  Do not apply any lotions/deoderants the morning of surgery.  Please wear clean clothes to the hospital/surgery center.    Please read over the following fact sheets that you were given. Pain Booklet, Coughing and Deep Breathing, MRSA Information and Surgical Site Infection Prevention

## 2014-11-28 NOTE — Progress Notes (Addendum)
Cardiologist is Dr.Klein with last visit in July--sees in [place] office    Medical Md is Dr.Mark Jeananne Rama  Multiple echo reports in [place] recent in 2015  Stress test in epic from 2016  EKG in epic from 10-17-14   Denies CXR in past yr  Heart cath done > 63yrs ago

## 2014-11-28 NOTE — Progress Notes (Signed)
Spoke with [name] [name] with [facility] about pt surgery and arrival time.

## 2014-11-29 LAB — BASIC METABOLIC PANEL
ANION GAP: 9 (ref 5–15)
BUN: 12 mg/dL (ref 6–20)
CHLORIDE: 102 mmol/L (ref 101–111)
CO2: 26 mmol/L (ref 22–32)
Calcium: 9.9 mg/dL (ref 8.9–10.3)
Creatinine, Ser: 0.94 mg/dL (ref 0.44–1.00)
GFR calc non Af Amer: >60 mL/min (ref >60)
Glucose, Bld: 100 mg/dL — ABNORMAL HIGH (ref 65–99)
Potassium: 4.4 mmol/L (ref 3.5–5.1)
SODIUM: 137 mmol/L (ref 135–145)

## 2014-11-29 LAB — CBC
HEMATOCRIT: 40.3 % (ref 36.0–46.0)
HEMOGLOBIN: 13.2 g/dL (ref 12.0–15.0)
MCH: 29.8 pg (ref 26.0–34.0)
MCHC: 32.8 g/dL (ref 30.0–36.0)
MCV: 91 fL (ref 78.0–100.0)
Platelets: 188 10*3/uL (ref 150–400)
RBC: 4.43 MIL/uL (ref 3.87–5.11)
RDW: 15.4 % (ref 11.5–15.5)
WBC: 7.4 10*3/uL (ref 4.0–10.5)

## 2014-11-29 NOTE — Progress Notes (Signed)
Anesthesia Chart Review: Patient is a 59 year old female posted for pacemaker lead removal, left chest on 12/06/14 by Dr. Lovena Le. CT surgeon Dr. Cyndia Bent is back up. EP orders were pending at the time of patient's PAT visit.  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  PCP is Dr. Golden Pop. EP cardiologist is Dr. Caryl Comes [facility] office).   10/17/14 EKG: AV dual paced rhythm.   10/20/14 Nuclear stress test:   Defect 1: There is a medium defect of severe severity present in the apical inferior and apex location.  Findings consistent with prior myocardial infarction. No reversibility.  The left ventricular ejection fraction is moderately decreased (30-44%).  This is an intermediate risk study. Following results, Dr. Caryl Comes felt patient was acceptable risk for right THA which she underwent on 10/31/14 with plans for PPM lead revision following recovery. Notes from [name], PA-C indicate that at her next post-operative follow-up, it would be determined if any additional testing versus observation would be pursued due to her stress test results. I confirmed with Thurmond Butts that this was still the plan for follow-up after her lead revision.     11/01/13 Echo: - Left ventricle: The cavity size was normal. Systolic function was mildly to moderately reduced. The estimated ejection fraction was in the range of 40% to 45%. On apical images the EF appears closer to 35 to 40%. Diffuse hypokinesis. Regional wall motion abnormalities cannot be excluded. Septal wall motion abnormality secondary to underlying conduction abnormality. Left ventricular diastolic function parameters were normal. - Mitral valve: There was mild regurgitation. - Left  atrium: The atrium was mildly dilated. - Right ventricle: Systolic function was normal. - Pulmonary arteries: Systolic pressure was within the normal range.  Reports a cardiac cath > 10 years ago.  PAT RN notified [employer] Jude rep of surgery/arrival time.  PAT labs not resulted by this morning. Down time procedure used yesterday. Lab reported they did not have or could not locate orders. I entered CBC and BMET as add that fortunately were resulted today. She is scheduled for a T&S on arrival.   She tolerated THA last month. EP cardiology has scheduled PPM lead extraction. If no acute changes then I would anticipate that she could proceed as planned.  George Hugh [facility]/Anesthesiology Phone [phone] 11/29/2014 12:04 PM

## 2014-12-01 ENCOUNTER — Telehealth: Payer: Self-pay

## 2014-12-01 NOTE — Telephone Encounter (Signed)
l msg to schedule f/u with [name], PA

## 2014-12-04 NOTE — Telephone Encounter (Signed)
lmov to schedule Fu with [name] PA

## 2014-12-04 NOTE — Progress Notes (Signed)
Lmov to see if patient can call us to schedule the FU

## 2014-12-05 ENCOUNTER — Telehealth: Payer: Self-pay

## 2014-12-05 MED ORDER — SODIUM CHLORIDE 0.9 % IV SOLN
INTRAVENOUS | Status: DC
Start: 2014-12-06 — End: 2014-12-06

## 2014-12-05 MED ORDER — CEFAZOLIN SODIUM-DEXTROSE 2-3 GM-% IV SOLR
2.0000 g | INTRAVENOUS | Status: AC
  Administered 2014-12-06 (×2): 2 g via INTRAVENOUS
  Filled 2014-12-05: qty 50

## 2014-12-05 MED ORDER — SODIUM CHLORIDE 0.9 % IR SOLN
80.0000 mg | Status: AC
  Administered 2014-12-06: 80 mg
  Filled 2014-12-05 (×2): qty 2

## 2014-12-05 NOTE — Telephone Encounter (Signed)
Called pt to set up f/u [name] [name] After speaking w/scheduling, will need to open slot Apologized to pt and informed her we would cb Pt agreeable

## 2014-12-06 ENCOUNTER — Encounter (HOSPITAL_COMMUNITY)
Admission: AD | Disposition: A | Discharge status: Home / Self Care | Payer: Self-pay | Source: Ambulatory Visit | Attending: Internal Medicine

## 2014-12-06 ENCOUNTER — Ambulatory Visit (HOSPITAL_COMMUNITY): Payer: BLUE CROSS/BLUE SHIELD | Admitting: Certified Registered Nurse Anesthetist

## 2014-12-06 ENCOUNTER — Ambulatory Visit (HOSPITAL_COMMUNITY): Payer: BLUE CROSS/BLUE SHIELD

## 2014-12-06 ENCOUNTER — Encounter (HOSPITAL_COMMUNITY): Payer: Self-pay | Admitting: Certified Registered Nurse Anesthetist

## 2014-12-06 ENCOUNTER — Ambulatory Visit (HOSPITAL_COMMUNITY): Payer: BLUE CROSS/BLUE SHIELD | Admitting: Vascular Surgery

## 2014-12-06 ENCOUNTER — Observation Stay (HOSPITAL_COMMUNITY)
Admission: AD | Admit: 2014-12-06 | Discharge: 2014-12-08 | DRG: 243 | Disposition: A | Discharge status: Home / Self Care | Payer: BLUE CROSS/BLUE SHIELD | Source: Ambulatory Visit | Attending: Internal Medicine | Admitting: Internal Medicine

## 2014-12-06 DIAGNOSIS — Z91041 Radiographic dye allergy status: Secondary | ICD-10-CM | POA: Insufficient documentation

## 2014-12-06 DIAGNOSIS — I442 Atrioventricular block, complete: Secondary | ICD-10-CM | POA: Diagnosis not present

## 2014-12-06 DIAGNOSIS — Z801 Family history of malignant neoplasm of trachea, bronchus and lung: Secondary | ICD-10-CM | POA: Diagnosis not present

## 2014-12-06 DIAGNOSIS — I739 Peripheral vascular disease, unspecified: Secondary | ICD-10-CM | POA: Diagnosis not present

## 2014-12-06 DIAGNOSIS — E039 Hypothyroidism, unspecified: Secondary | ICD-10-CM | POA: Insufficient documentation

## 2014-12-06 DIAGNOSIS — Z7982 Long term (current) use of aspirin: Secondary | ICD-10-CM | POA: Insufficient documentation

## 2014-12-06 DIAGNOSIS — Z791 Long term (current) use of non-steroidal anti-inflammatories (NSAID): Secondary | ICD-10-CM | POA: Diagnosis not present

## 2014-12-06 DIAGNOSIS — Z419 Encounter for procedure for purposes other than remedying health state, unspecified: Secondary | ICD-10-CM

## 2014-12-06 DIAGNOSIS — I429 Cardiomyopathy, unspecified: Secondary | ICD-10-CM | POA: Insufficient documentation

## 2014-12-06 DIAGNOSIS — Z91040 Latex allergy status: Secondary | ICD-10-CM | POA: Insufficient documentation

## 2014-12-06 DIAGNOSIS — T82190A Other mechanical complication of cardiac electrode, initial encounter: Principal | ICD-10-CM | POA: Insufficient documentation

## 2014-12-06 DIAGNOSIS — Z8543 Personal history of malignant neoplasm of ovary: Secondary | ICD-10-CM | POA: Insufficient documentation

## 2014-12-06 DIAGNOSIS — Z79899 Other long term (current) drug therapy: Secondary | ICD-10-CM | POA: Diagnosis not present

## 2014-12-06 DIAGNOSIS — Z87891 Personal history of nicotine dependence: Secondary | ICD-10-CM | POA: Insufficient documentation

## 2014-12-06 DIAGNOSIS — E782 Mixed hyperlipidemia: Secondary | ICD-10-CM | POA: Insufficient documentation

## 2014-12-06 DIAGNOSIS — Y712 Prosthetic and other implants, materials and accessory cardiovascular devices associated with adverse incidents: Secondary | ICD-10-CM | POA: Diagnosis not present

## 2014-12-06 DIAGNOSIS — T82110A Breakdown (mechanical) of cardiac electrode, initial encounter: Secondary | ICD-10-CM

## 2014-12-06 DIAGNOSIS — I10 Essential (primary) hypertension: Secondary | ICD-10-CM | POA: Insufficient documentation

## 2014-12-06 DIAGNOSIS — T82110D Breakdown (mechanical) of cardiac electrode, subsequent encounter: Secondary | ICD-10-CM

## 2014-12-06 DIAGNOSIS — Z950 Presence of cardiac pacemaker: Secondary | ICD-10-CM

## 2014-12-06 HISTORY — PX: PACEMAKER LEAD REMOVAL: SHX5064

## 2014-12-06 HISTORY — PX: PACEMAKER PLACEMENT: SHX43

## 2014-12-06 HISTORY — PX: EP IMPLANTABLE DEVICE: SHX172B

## 2014-12-06 LAB — CBC
HCT: 34.1 % — ABNORMAL LOW (ref 36.0–46.0)
Hemoglobin: 11.4 g/dL — ABNORMAL LOW (ref 12.0–15.0)
MCH: 29.5 pg (ref 26.0–34.0)
MCHC: 33.4 g/dL (ref 30.0–36.0)
MCV: 88.3 fL (ref 78.0–100.0)
PLATELETS: 141 10*3/uL — AB (ref 150–400)
RBC: 3.86 MIL/uL — ABNORMAL LOW (ref 3.87–5.11)
RDW: 14.3 % (ref 11.5–15.5)
WBC: 7 10*3/uL (ref 4.0–10.5)

## 2014-12-06 LAB — CREATININE, SERUM
CREATININE: 0.73 mg/dL (ref 0.44–1.00)
GFR calc Af Amer: >60 mL/min (ref >60)

## 2014-12-06 LAB — PREPARE RBC (CROSSMATCH)

## 2014-12-06 LAB — ABO/RH: ABO/RH(D): O POS

## 2014-12-06 LAB — MRSA PCR SCREENING: MRSA by PCR: NEGATIVE

## 2014-12-06 SURGERY — REMOVAL, ELECTRODE LEAD, CARDIAC PACEMAKER, WITHOUT REPLACEMENT
Anesthesia: General | Site: Chest | Laterality: Right

## 2014-12-06 MED ORDER — LACTATED RINGERS IV SOLN: INTRAVENOUS | Status: DC

## 2014-12-06 MED ORDER — FAMOTIDINE IN NACL 20-0.9 MG/50ML-% IV SOLN
20.0000 mg | Freq: Two times a day (BID) | INTRAVENOUS | Status: DC
Start: 2014-12-06 — End: 2014-12-06
  Administered 2014-12-06: 20 mg via INTRAVENOUS
  Filled 2014-12-06: qty 50

## 2014-12-06 MED ORDER — MIDAZOLAM HCL 2 MG/2ML IJ SOLN
INTRAMUSCULAR | Status: AC
  Filled 2014-12-06: qty 4

## 2014-12-06 MED ORDER — PROMETHAZINE HCL 25 MG/ML IJ SOLN: 6.2500 mg | INTRAMUSCULAR | Status: DC | PRN

## 2014-12-06 MED ORDER — ONDANSETRON HCL 4 MG/2ML IJ SOLN: 4.0000 mg | Freq: Four times a day (QID) | INTRAMUSCULAR | Status: DC | PRN

## 2014-12-06 MED ORDER — LIDOCAINE HCL (PF) 1 % IJ SOLN
INTRAMUSCULAR | Status: AC
  Filled 2014-12-06: qty 30

## 2014-12-06 MED ORDER — PHENYLEPHRINE HCL 10 MG/ML IJ SOLN
10.0000 mg | INTRAVENOUS | Status: DC | PRN
  Administered 2014-12-06: 10 ug/min via INTRAVENOUS

## 2014-12-06 MED ORDER — HEPARIN SODIUM (PORCINE) 1000 UNIT/ML IJ SOLN
INTRAMUSCULAR | Status: AC
  Filled 2014-12-06: qty 2

## 2014-12-06 MED ORDER — EPHEDRINE SULFATE 50 MG/ML IJ SOLN
INTRAMUSCULAR | Status: AC
  Filled 2014-12-06: qty 1

## 2014-12-06 MED ORDER — LEVOTHYROXINE SODIUM 100 MCG PO TABS
100.0000 ug | ORAL_TABLET | Freq: Every day | ORAL | Status: DC
  Administered 2014-12-07 – 2014-12-08 (×2): 100 ug via ORAL
  Filled 2014-12-06 (×2): qty 1

## 2014-12-06 MED ORDER — MELOXICAM 15 MG PO TABS
15.0000 mg | ORAL_TABLET | Freq: Every day | ORAL | Status: DC
  Administered 2014-12-07 – 2014-12-08 (×2): 15 mg via ORAL
  Filled 2014-12-06 (×3): qty 1

## 2014-12-06 MED ORDER — HEPARIN SODIUM (PORCINE) 5000 UNIT/ML IJ SOLN
5000.0000 [IU] | Freq: Three times a day (TID) | INTRAMUSCULAR | Status: DC
  Administered 2014-12-06 – 2014-12-08 (×6): 5000 [IU] via SUBCUTANEOUS
  Filled 2014-12-06 (×6): qty 1

## 2014-12-06 MED ORDER — CEFAZOLIN SODIUM 1-5 GM-% IV SOLN
1.0000 g | Freq: Four times a day (QID) | INTRAVENOUS | Status: AC
  Administered 2014-12-06 – 2014-12-07 (×3): 1 g via INTRAVENOUS
  Filled 2014-12-06 (×3): qty 50

## 2014-12-06 MED ORDER — STERILE WATER FOR INJECTION IJ SOLN
INTRAMUSCULAR | Status: AC
  Filled 2014-12-06: qty 10

## 2014-12-06 MED ORDER — GLYCOPYRROLATE 0.2 MG/ML IJ SOLN
INTRAMUSCULAR | Status: DC | PRN
  Administered 2014-12-06: 0.4 mg via INTRAVENOUS

## 2014-12-06 MED ORDER — VECURONIUM BROMIDE 10 MG IV SOLR
INTRAVENOUS | Status: AC
  Filled 2014-12-06: qty 10

## 2014-12-06 MED ORDER — LIDOCAINE HCL (PF) 1 % IJ SOLN
INTRAMUSCULAR | Status: DC | PRN
  Administered 2014-12-06: 30 mL

## 2014-12-06 MED ORDER — OXYCODONE HCL 5 MG PO TABS
5.0000 mg | ORAL_TABLET | ORAL | Status: DC | PRN
  Administered 2014-12-06 – 2014-12-08 (×5): 5 mg via ORAL
  Filled 2014-12-06 (×5): qty 1

## 2014-12-06 MED ORDER — HYDROMORPHONE HCL 1 MG/ML IJ SOLN
INTRAMUSCULAR | Status: AC
  Filled 2014-12-06: qty 1

## 2014-12-06 MED ORDER — DIPHENHYDRAMINE HCL 50 MG/ML IJ SOLN
INTRAMUSCULAR | Status: DC | PRN
  Administered 2014-12-06: 12.5 mg via INTRAVENOUS

## 2014-12-06 MED ORDER — SUCCINYLCHOLINE CHLORIDE 20 MG/ML IJ SOLN
INTRAMUSCULAR | Status: AC
  Filled 2014-12-06: qty 1

## 2014-12-06 MED ORDER — VECURONIUM BROMIDE 10 MG IV SOLR
INTRAVENOUS | Status: DC | PRN
  Administered 2014-12-06 (×3): 1 mg via INTRAVENOUS

## 2014-12-06 MED ORDER — IODIXANOL 320 MG/ML IV SOLN
INTRAVENOUS | Status: DC | PRN
Start: 2014-12-06 — End: 2014-12-06
  Administered 2014-12-06: 10 mL via INTRAVENOUS

## 2014-12-06 MED ORDER — ROCURONIUM BROMIDE 100 MG/10ML IV SOLN
INTRAVENOUS | Status: DC | PRN
  Administered 2014-12-06: 50 mg via INTRAVENOUS

## 2014-12-06 MED ORDER — FENTANYL CITRATE (PF) 100 MCG/2ML IJ SOLN
INTRAMUSCULAR | Status: DC | PRN
  Administered 2014-12-06 (×4): 50 ug via INTRAVENOUS

## 2014-12-06 MED ORDER — ROCURONIUM BROMIDE 50 MG/5ML IV SOLN
INTRAVENOUS | Status: AC
  Filled 2014-12-06: qty 1

## 2014-12-06 MED ORDER — PROPOFOL 10 MG/ML IV BOLUS
INTRAVENOUS | Status: DC | PRN
  Administered 2014-12-06: 150 mg via INTRAVENOUS

## 2014-12-06 MED ORDER — HYDROMORPHONE HCL 1 MG/ML IJ SOLN
0.2500 mg | INTRAMUSCULAR | Status: DC | PRN
  Administered 2014-12-06: 0.25 mg via INTRAVENOUS

## 2014-12-06 MED ORDER — CLONAZEPAM 0.5 MG PO TABS
0.5000 mg | ORAL_TABLET | Freq: Every day | ORAL | Status: DC | PRN
  Administered 2014-12-06 – 2014-12-07 (×2): 0.5 mg via ORAL
  Filled 2014-12-06 (×2): qty 1

## 2014-12-06 MED ORDER — LIDOCAINE HCL (CARDIAC) 20 MG/ML IV SOLN
INTRAVENOUS | Status: AC
  Filled 2014-12-06: qty 5

## 2014-12-06 MED ORDER — ONDANSETRON HCL 4 MG/2ML IJ SOLN
INTRAMUSCULAR | Status: AC
  Filled 2014-12-06: qty 2

## 2014-12-06 MED ORDER — SODIUM CHLORIDE 0.9 % IR SOLN
Status: DC | PRN
  Administered 2014-12-06: 500 mL

## 2014-12-06 MED ORDER — METHYLPREDNISOLONE SODIUM SUCC 125 MG IJ SOLR
INTRAMUSCULAR | Status: DC | PRN
  Administered 2014-12-06: 62.5 mg via INTRAVENOUS

## 2014-12-06 MED ORDER — LIDOCAINE HCL (CARDIAC) 20 MG/ML IV SOLN
INTRAVENOUS | Status: DC | PRN
  Administered 2014-12-06: 70 mg via INTRAVENOUS

## 2014-12-06 MED ORDER — FENTANYL CITRATE (PF) 250 MCG/5ML IJ SOLN
INTRAMUSCULAR | Status: AC
  Filled 2014-12-06: qty 5

## 2014-12-06 MED ORDER — MIDAZOLAM HCL 5 MG/5ML IJ SOLN
INTRAMUSCULAR | Status: DC | PRN
  Administered 2014-12-06 (×2): 1 mg via INTRAVENOUS

## 2014-12-06 MED ORDER — LACTATED RINGERS IV SOLN
INTRAVENOUS | Status: DC | PRN
  Administered 2014-12-06: 08:00:00 via INTRAVENOUS

## 2014-12-06 MED ORDER — MEPERIDINE HCL 25 MG/ML IJ SOLN: 6.2500 mg | INTRAMUSCULAR | Status: DC | PRN

## 2014-12-06 MED ORDER — DEXAMETHASONE SODIUM PHOSPHATE 4 MG/ML IJ SOLN
INTRAMUSCULAR | Status: AC
  Filled 2014-12-06: qty 1

## 2014-12-06 MED ORDER — ONDANSETRON HCL 4 MG/2ML IJ SOLN
INTRAMUSCULAR | Status: DC | PRN
  Administered 2014-12-06: 4 mg via INTRAVENOUS

## 2014-12-06 MED ORDER — CEFAZOLIN SODIUM-DEXTROSE 2-3 GM-% IV SOLR
INTRAVENOUS | Status: AC
  Filled 2014-12-06: qty 50

## 2014-12-06 MED ORDER — LACTATED RINGERS IV SOLN
INTRAVENOUS | Status: DC | PRN
  Administered 2014-12-06 (×2): via INTRAVENOUS

## 2014-12-06 MED ORDER — NEOSTIGMINE METHYLSULFATE 10 MG/10ML IV SOLN
INTRAVENOUS | Status: DC | PRN
  Administered 2014-12-06: 3 mg via INTRAVENOUS

## 2014-12-06 MED ORDER — HEPARIN SODIUM (PORCINE) 5000 UNIT/ML IJ SOLN
INTRAMUSCULAR | Status: DC | PRN
  Administered 2014-12-06 (×2): 500 mL

## 2014-12-06 MED ORDER — PROPOFOL 10 MG/ML IV BOLUS
INTRAVENOUS | Status: AC
  Filled 2014-12-06: qty 20

## 2014-12-06 MED ORDER — ACETAMINOPHEN 325 MG PO TABS: 325.0000 mg | ORAL_TABLET | ORAL | Status: DC | PRN

## 2014-12-06 MED ORDER — ASPIRIN EC 81 MG PO TBEC
81.0000 mg | DELAYED_RELEASE_TABLET | Freq: Every day | ORAL | Status: DC
  Administered 2014-12-06 – 2014-12-08 (×3): 81 mg via ORAL
  Filled 2014-12-06 (×5): qty 1

## 2014-12-06 MED ORDER — PRAVASTATIN SODIUM 10 MG PO TABS
10.0000 mg | ORAL_TABLET | Freq: Every day | ORAL | Status: DC
  Administered 2014-12-06 – 2014-12-07 (×2): 10 mg via ORAL
  Filled 2014-12-06 (×3): qty 1

## 2014-12-06 MED ORDER — DEXAMETHASONE SODIUM PHOSPHATE 4 MG/ML IJ SOLN
INTRAMUSCULAR | Status: DC | PRN
  Administered 2014-12-06: 4 mg via INTRAVENOUS

## 2014-12-06 SURGICAL SUPPLY — 88 items
APL SKNCLS STERI-STRIP NONHPOA (GAUZE/BANDAGES/DRESSINGS) ×2
BAG BANDED W/RUBBER/TAPE 36X54 (MISCELLANEOUS) ×3 IMPLANT
BAG DECANTER FOR FLEXI CONT (MISCELLANEOUS) ×6 IMPLANT
BAG EQP BAND 135X91 W/RBR TAPE (MISCELLANEOUS) ×1
BENZOIN TINCTURE PRP APPL 2/3 (GAUZE/BANDAGES/DRESSINGS) ×6 IMPLANT
BLADE 10 SAFETY STRL DISP (BLADE) IMPLANT
BLADE OSCILLATING /SAGITTAL (BLADE) IMPLANT
BLADE STERNUM SYSTEM 6 (BLADE) ×6 IMPLANT
BNDG COHESIVE 4X5 WHT NS (GAUZE/BANDAGES/DRESSINGS) IMPLANT
CANISTER SUCTION 2500CC (MISCELLANEOUS) ×3 IMPLANT
CATH CPS DIRECT 135 DS2C020 (CATHETERS) ×3 IMPLANT
CATH S G BIP PACING (SET/KITS/TRAYS/PACK) ×3 IMPLANT
CLIPPER LEAD (MISCELLANEOUS) ×3
CLSR STERI-STRIP ANTIMIC 1/2X4 (GAUZE/BANDAGES/DRESSINGS) ×3 IMPLANT
COIL ONE TIE COMPRESSION (MISCELLANEOUS) ×15 IMPLANT
COVER SURGICAL LIGHT HANDLE (MISCELLANEOUS) ×3 IMPLANT
COVER TABLE BACK 60X90 (DRAPES) ×3 IMPLANT
CPS IMPLANT KIT 410190 (MISCELLANEOUS) ×3 IMPLANT
DEVICE LCKNG LEAD CARDIAC (CATHETERS) ×2 IMPLANT
DISPOSABLE ADAPTER CABLE ×3 IMPLANT
DRAPE CARDIOVASCULAR INCISE (DRAPES) ×1
DRAPE SRG 135X102X78XABS (DRAPES) ×2 IMPLANT
DRSG OPSITE 6X11 MED (GAUZE/BANDAGES/DRESSINGS) IMPLANT
DRSG TEGADERM 2-3/8X2-3/4 SM (GAUZE/BANDAGES/DRESSINGS) ×6 IMPLANT
DRSG TEGADERM 4X4.75 (GAUZE/BANDAGES/DRESSINGS) ×3 IMPLANT
ELECT REM PT RETURN 9FT ADLT (ELECTROSURGICAL) ×6
ELECTRODE REM PT RTRN 9FT ADLT (ELECTROSURGICAL) ×4 IMPLANT
EXTENDER BULLDOG LEAD (MISCELLANEOUS) ×6 IMPLANT
GAUZE SPONGE 2X2 8PLY STRL LF (GAUZE/BANDAGES/DRESSINGS) ×4 IMPLANT
GAUZE SPONGE 4X4 12PLY STRL (GAUZE/BANDAGES/DRESSINGS) ×3 IMPLANT
GAUZE SPONGE 4X4 16PLY XRAY LF (GAUZE/BANDAGES/DRESSINGS) IMPLANT
GLOVE BIOGEL PI IND STRL 6 (GLOVE) ×8 IMPLANT
GLOVE BIOGEL PI IND STRL 6.5 (GLOVE) ×2 IMPLANT
GLOVE BIOGEL PI IND STRL 7.0 (GLOVE) ×2 IMPLANT
GLOVE BIOGEL PI IND STRL 7.5 (GLOVE) ×4 IMPLANT
GLOVE BIOGEL PI IND STRL 8 (GLOVE) ×4 IMPLANT
GLOVE BIOGEL PI INDICATOR 6 (GLOVE) ×4
GLOVE BIOGEL PI INDICATOR 6.5 (GLOVE) ×1
GLOVE BIOGEL PI INDICATOR 7.0 (GLOVE) ×1
GLOVE BIOGEL PI INDICATOR 7.5 (GLOVE) ×2
GLOVE BIOGEL PI INDICATOR 8 (GLOVE) ×2
GLOVE ECLIPSE 8.0 STRL XLNG CF (GLOVE) IMPLANT
GLOVE SURG SS PI 6.5 STRL IVOR (GLOVE) ×3 IMPLANT
GLOVE SURG SS PI 7.5 STRL IVOR (GLOVE) ×3 IMPLANT
GLOVE SURG SS PI 8.0 STRL IVOR (GLOVE) ×9 IMPLANT
GOWN STRL REUS W/ TWL LRG LVL3 (GOWN DISPOSABLE) ×6 IMPLANT
GOWN STRL REUS W/ TWL XL LVL3 (GOWN DISPOSABLE) ×4 IMPLANT
GOWN STRL REUS W/TWL LRG LVL3 (GOWN DISPOSABLE) ×6
GOWN STRL REUS W/TWL XL LVL3 (GOWN DISPOSABLE) ×2
KIT ROOM TURNOVER OR (KITS) ×3 IMPLANT
LEAD CLIPPER (MISCELLANEOUS) ×2 IMPLANT
LEAD TENDRIL SDX 1688TC-52CM (Lead) ×3 IMPLANT
LEAD TENDRIL SDX 1688TC-58CM (Lead) ×3 IMPLANT
NEEDLE PERC 18GX7CM (NEEDLE) ×6 IMPLANT
PACEMAKER ALLR CRT-P RF PM3222 (Pacemaker) ×2 IMPLANT
PAD ARMBOARD 7.5X6 YLW CONV (MISCELLANEOUS) ×6 IMPLANT
PAD ELECT DEFIB RADIOL ZOLL (MISCELLANEOUS) ×3 IMPLANT
PPM ALLURE CRT-P RF PM3222 (Pacemaker) ×3 IMPLANT
REMINGTON MEDICAL DISPOSABLE ADAPTER CABLE ×3 IMPLANT
REMOVAL LLD CARDIAC LEAD EZ (CATHETERS) ×3
SHEATH CLASSIC 7F (SHEATH) ×6 IMPLANT
SHEATH CRV INNER FEMORAL 12FR (SHEATH) ×3 IMPLANT
SHEATH EVOLUTION RL 11F (SHEATH) ×3 IMPLANT
SHEATH EVOLUTION RL 13F (SHEATH) ×3 IMPLANT
SHEATH EVOLUTION SHORTE RL 11F (SHEATH) ×3 IMPLANT
SHEATH PINNACLE 6F 10CM (SHEATH) ×3 IMPLANT
SHEATH SET CRV FEMORAL 16FR (SHEATH) ×3 IMPLANT
SNARE NEEDLE EYE 13MM (CATHETERS) ×3 IMPLANT
SNARE NEEDLE EYE 20MM W/SHTH (CATHETERS) ×3 IMPLANT
SPONGE GAUZE 2X2 STER 10/PKG (GAUZE/BANDAGES/DRESSINGS) ×2
STRIP CLOSURE SKIN 1/2X4 (GAUZE/BANDAGES/DRESSINGS) ×3 IMPLANT
STYLET LIBERATOR LOCKING (MISCELLANEOUS) ×6 IMPLANT
SUT PROLENE 2 0 SH DA (SUTURE) IMPLANT
SUT SILK  1 MH (SUTURE) ×1
SUT SILK 1 MH (SUTURE) ×2 IMPLANT
SUT VIC AB 2-0 CT1 27 (SUTURE) ×3
SUT VIC AB 2-0 CT1 36 (SUTURE) ×3 IMPLANT
SUT VIC AB 2-0 CT1 TAPERPNT 27 (SUTURE) ×2 IMPLANT
SUT VIC AB 3-0 SH 27 (SUTURE) ×8
SUT VIC AB 3-0 SH 27X BRD (SUTURE) ×8 IMPLANT
TOWEL OR 17X24 6PK STRL BLUE (TOWEL DISPOSABLE) ×6 IMPLANT
TOWEL OR 17X26 10 PK STRL BLUE (TOWEL DISPOSABLE) ×6 IMPLANT
TRANSMITTER RF MONITORING (MISCELLANEOUS) ×3 IMPLANT
TRAY FOLEY BAG SILVER LF 16FR (SET/KITS/TRAYS/PACK) ×3 IMPLANT
TRAY FOLEY IC TEMP SENS 14FR (CATHETERS) IMPLANT
TUBE CONNECTING 12X1/4 (SUCTIONS) ×3 IMPLANT
WOVEN DIAGNOSTIC ELECTRODE CATHETER ×3 IMPLANT
YANKAUER SUCT BULB TIP NO VENT (SUCTIONS) ×3 IMPLANT

## 2014-12-06 NOTE — Transfer of Care (Signed)
Immediate Anesthesia Transfer of Care Note  Patient: [name] [name]  Procedure(s) Performed: Procedure(s) with comments: REMOVAL OF RV AND RA PACEMAKER LEADS (Right) - DR. BARTLE TO BACK UP  Implantation of cardiac resynchronization therapy pacemaker (Left)  Patient Location: PACU  Anesthesia Type:General  Level of Consciousness: awake, alert  and oriented  Airway & Oxygen Therapy: Patient Spontanous Breathing and Patient connected to nasal cannula oxygen  Post-op Assessment: Report given to RN, Post -op Vital signs reviewed and stable and Patient moving all extremities X 4  Post vital signs: Reviewed and stable  Last Vitals:  Filed Vitals:   12/06/14 0720  BP: 134/75  Pulse: 70  Temp: 36.8 C  Resp: 20    Complications: No apparent anesthesia complications

## 2014-12-06 NOTE — H&P (Signed)
HPI [name] [name] is referred today by Dr. Caryl Comes for consideration for PM lead extraction. She is a pleasant 59 yo woman with complete heart block, s/p PPM insertion in 1996. She underwent generator change out in 2012. She had developed noise on her atrial and ventricular leads. Her PPM has been reprogrammed DOOR from DDDR. She has not had syncope. No near syncope. She is pending hip surgery. She does not have heart failure symptoms. Allergies  Allergen Reactions  . Marshmallow [Althaea Officinalis]     Rash, hives, itching, SOB  . Meat Extract     Rash, hives itching, SOB.  Can tolerate some meats ONLY in moderation  . Other     Jello - Rash, hives, itching, SOB  . Contrast Media [Iodinated Diagnostic Agents]     unknown  . Latex     rash  . Nickel     rash     Current Outpatient Prescriptions  Medication Sig Dispense Refill  . benazepril (LOTENSIN) 10 MG tablet Take 10 mg by mouth daily.     . clonazePAM (KLONOPIN) 0.5 MG tablet Take 0.5 mg by mouth daily as needed for anxiety.     Marland Kitchen levothyroxine (SYNTHROID, LEVOTHROID) 100 MCG tablet Take 100 mcg by mouth daily.     Marland Kitchen lovastatin (MEVACOR) 20 MG tablet Take 1 tablet (20 mg total) by mouth at bedtime. 30 tablet 3  . metoprolol (LOPRESSOR) 50 MG tablet Take 75 mg by mouth 2 (two) times daily.     . Multiple Vitamins-Minerals (MULTIVITAMIN ADULT PO) Take 1 tablet by mouth daily.    . ranitidine (ZANTAC) 150 MG capsule Take 150 mg by mouth 2 (two) times daily as needed for heartburn.    Marland Kitchen aspirin 81 MG tablet Take 81 mg by mouth daily.     . meloxicam (MOBIC) 15 MG tablet Take 15 mg by mouth daily.     No current facility-administered medications for this visit.     Past Medical History  Diagnosis Date  . Hyperlipidemia   . Hypothyroid   . Cardiomyopathy     tachycardia induced. EF 35-40% 06/2008 "patient denies"   . Hypertension   . Abdominal pain     Intermittent pain with firmness, ache, more at night, pain in different quadrants each time  . 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 Complete AV block     s/p pacemaker insertion 1996  . Peripheral artery disease     stent on lt illiac  . Peripheral vascular disease   . Ovarian cancer     ovarian, skin     ROS:  All systems reviewed and negative except as noted in the HPI.   Past Surgical History  Procedure Laterality Date  . Pacemaker insertion  2012    Had replaced in July 2012, first placed in 1996  . Knee surgery    . Total abdominal hysterectomy w/ bilateral salpingoophorectomy  07/2008    Ovarian cancer  . Insert / replace / remove pacemaker    . Iliac artery stent  Apr 05, 2010    Left artery stenting  . Angioplasty / stenting iliac    . Abdominal hysterectomy    . Hernia mesh removal Right     incisional     Family History  Problem Relation Age of Onset  . Cancer Father     died of lung cancer  . Lung cancer Father   . Diabetes  Brother   . Hyperlipidemia Mother      History   Social History  . Marital Status: Married    Spouse Name: N/A  . Number of Children: N/A  . Years of Education: N/A   Occupational History  . Not on file.   Social History Main Topics  . Smoking status: Former Smoker    Quit date: 06/12/2008  . Smokeless tobacco: Not on file  . Alcohol Use: 0.0 oz/week     Comment: 4-6 cans beer/ night  . Drug Use: No  . Sexual Activity: Not Currently   Other Topics Concern  . Not on file   Social History Narrative     BP 120/68 mmHg  Pulse 70  Ht 5\' 5"  (1.651 m)  Wt 187 lb 9.6 oz (85.095 kg)  BMI 31.22 kg/m2  Physical Exam:  Well appearing  middle aged woman, NAD HEENT: Unremarkable Neck: 6 cm JVD, no thyromegally Back: No CVA tenderness Lungs: Clear with no wheezes HEART: Regular rate rhythm, no murmurs, no rubs, no clicks Abd: soft, positive bowel sounds, no organomegally, no rebound, no guarding Ext: 2 plus pulses, no edema, no cyanosis, no clubbing Skin: No rashes no nodules Neuro: CN II through XII intact, motor grossly intact   DEVICE  Atrial and ventricular noise has resulted in reprogramming to DOOR  Assess/Plan:            Pacemaker lead failure--noise on atrial greater than ventricular lead with ventricular backup pulse pacing - Evans Lance, MD at 10/27/2014 5:37 PM     Status: Written Related Problem: Pacemaker lead failure--noise on atrial greater than ventricular lead with ventricular backup pulse pacing   Expand All Collapse All   Her St. Jude device is as programmed and she is currently stable with sensing turned off. We discussed the treatment options in detail. I have offered her lead extraction but also insertion of new leads only, leaving the old leads in place. She will like to have everything removed. At her young age, I think that this is reasonable.            LV dysfunction - Evans Lance, MD at 10/27/2014 5:40 PM     Status: Written Related Problem: LV dysfunction   Expand All Collapse All   With lead extraction, I would anticipate placing a BiV pacemaker system as she has an EF of 40% by echo and would likely be worse in the long term with RV/RA pacing            HYPERLIPIDEMIA-MIXED - Evans Lance, MD at 10/27/2014 5:41 PM     Status: Written Related Problem: HYPERLIPIDEMIA-MIXED   Expand All Collapse All   She is encouraged to lose weight and reduce her fat intake. She is on statin therapy as well.            PACEMAKER, PERMANENT - Evans Lance, MD at 10/27/2014 5:43 PM     Status: Written Related Problem: PACEMAKER, PERMANENT    Expand All Collapse All   She has approximately 7 years on her battery longevity. She is interested in a MRI compatible pacemaker as well.       EP attending  Patient seen and examined. Agree with above. No change in the history, exam, assessment and plan. Will plan to proceed with extraction and insertion of a new biv PPM.  Mikle Bosworth.D.

## 2014-12-06 NOTE — Progress Notes (Signed)
Orthopedic Tech Progress Note Patient Details:  [name] [name] 10/23/1955 [id number]  Ortho Devices Type of Ortho Device: Arm sling Ortho Device/Splint Location: (B) UE Ortho Device/Splint Interventions: Ordered, Application   Braulio Bosch 12/06/2014, 10:15 PM

## 2014-12-06 NOTE — Anesthesia Procedure Notes (Signed)
Procedure Name: Intubation Date/Time: 12/06/2014 8:55 AM Performed by: Garrison Columbus T Pre-anesthesia Checklist: Patient identified, Emergency Drugs available, Suction available and Patient being monitored Patient Re-evaluated:Patient Re-evaluated prior to inductionOxygen Delivery Method: Circle system utilized Preoxygenation: Pre-oxygenation with 100% oxygen Intubation Type: IV induction Ventilation: Mask ventilation without difficulty Laryngoscope Size: Miller and 2 Grade View: Grade I Tube type: Oral Tube size: 7.5 mm Number of attempts: 1 Airway Equipment and Method: Stylet Placement Confirmation: positive ETCO2,  ETT inserted through vocal cords under direct vision and breath sounds checked- equal and bilateral Secured at: 22 cm Tube secured with: Tape Dental Injury: Teeth and Oropharynx as per pre-operative assessment

## 2014-12-06 NOTE — CV Procedure (Signed)
Electrophysiology Procedure Note  Procedure: Extraction of a 57 year old DDD Pacing system and implantation of a DDD PM.   Preprocedure diagnosis: Complete heart block in a patient with electrical noise on both the atrial and ventricular pacing lead, resulting in failure to pace, in a patient with an ejection fraction of 40%.  Postprocedure diagnosis: Same as preprocedure diagnosis.  Description of the procedure: After informed consent was obtained, the patient was taken to the operating room in the fasting state. The anesthesia service was utilized to provide general anesthesia. Invasive hemodynamic monitoring was carried out. A transesophageal echo probe was placed in the esophagus. 6 French sheaths were placed in the left femoral and right femoral veins. A 5 cm incision was carried out and electrocautery was utilized to dissect down to the pacemaker pocket. The pacemaker generator was removed with gentle traction. Electrocautery was utilized to free up the atrial and ventricular pacing leads. Initially the atrial lead was targeted for extraction. Prior to the procedure, a very deep and in-depth conversation was carried out with the patient and her family regarding the merits of removal of her 57 year old pacing leads and inserting a new pacemaker versus capping the leads and placing a pacemaker on the contralateral side. She elected to have her leads removed. A 52 cm stylette was advanced into the atrial lead and the helix retracted successfully. A Cook locking stylette was then inserted into the atrial lead after the lead was. The stylette was locked in place. An 14 French Cook RL short dissection sheath was inserted over the lead and stylette and advanced down to the junction of the subclavian vein and superior vena cava. Next the [facility] 11 [place] RL long dissection sheath was advanced over the atrial lead and stylette and just as we entered the superior vena cava, the locking stylette broke and at this  point we used a Melina Modena Dog catheter to attach the lead and additional attempts to free up the dense fibrous binding sites were attempted. The insulation of the lead broke and at this point additional attempts to remove the atrial lead superiorly were abandoned. Attention was turned to the ventricular lead. Because of the patient's underlying complete heart block, a temporary pacemaker had been placed previously by way of the left femoral vein and into the right ventricle. After assurance of successful capture, attention was turned back towards removal of the 57 year old right ventricular passive fixation pacing lead. The Cook locking stylette was advanced almost to the tip of the RV lead. It was locked in place. The 37 French short RL dissection sheath was advanced over the RV lead and dissected down to the junction of the right subclavian vein and the superior vena cava. At this point the [facility] 11 [place] long RL dissection sheath was inserted and advanced down to the junction of the right atrium and right ventricle. At this point no additional progress could be made with the dissection sheath. The [place] long RL dissection sheath was inserted and it was advanced under fluoroscopic guidance to the tip of the lead. The lead remained fixed in place. At this point attention was turned to snairing of the broken right atrial lead. Initially the 20 mm snare and then the 13 mm Cook snare along with the curved outer 16 French catheter were utilized and after a moderate amount of difficulty, the atrial lead was successfully snared and pulled down into the inferior vena cava. The lead up at the right femoral vein junction  but was eventually removed in total. Hemostasis was obtained after holding pressure for approximately 20 minutes. Next attention was returned to the right ventricular lead and with the cardiac surgeon present in the room, additional traction was placed on the lead and it was removed in total with  no hemodynamic consequences. At this point the pacemaker pocket was irrigated and the incision was closed with 3 layers of Vicryl suture. Pressure was held on the right femoral vein for approximately 20 minutes. The patient was then prepped for insertion of a new pacemaker.  My initial plan was to insert a biventricular pacemaker. After the patient was reprepped and draped, initial attempts to puncture the left subclavian vein was unsuccessful. 10 cc of contrast was injected into the left upper extremity venous system demonstrated a markedly caudally displaced left subclavian vein. The vein was then punctured and The [employer] Jude 1688 active fixation pacing lead, serial number [id number] was advanced to the right atrium. The St. Jude model [id number] active fixation pacing lead, serial number [id number] was advanced to the right ventricle. Mapping was carried out of the right ventricle at the final site, the paced R waves measured 5 mV. The pacing threshold was less than a 0.5 ms. In the pacing impedance was 710 ohms. Attention was turned to the atrial lead which was placed in the anterolateral wall the right atrium. P waves measured 3 mV. The pacing threshold was a volt at 0.5 ms. The pacing impedance was 490 ohms. 10 V pacing did not estimate the diaphragm. With the satisfactory parameters, attention was turned to placement of the left ventricular lead. After multiple attempts to cannulate the coronary sinus, and after demonstration that the patient had received excess amount of fluoroscopic exposure, it was deemed most appropriate to proceed with insertion of the generator and not place the LV lead at this time. A biventricular pacemaker was connected. This was a [employer] serial number [id number] which was connected to the atrial and ventricular pacing lead. The LV port was capped. Electrocautery was utilized to make a subcutaneous pocket. The antibiotic irrigation was utilized to irrigate the pocket. A electrocautery was  utilized to assure hemostasis. The incision was closed with 2 layers of Vicryl suture. Benzoin and Steri-Strips were painted on the skin. The patient was returned to the recovery area in satisfactory condition.  Complications: There were no immediate complications  Conclusion: Successful extraction I'll be very very difficult of a 57 year old dual-chamber pacing system with insertion of a new dual-chamber pacing system on the contralateral side in a patient with complete heart block. The left ventricular lead could not be placed, but a biventricular generator was placed so that left ventricular lead insertion could be accomplished at a later time.  Cristopher Peru, M.D.

## 2014-12-06 NOTE — Anesthesia Preprocedure Evaluation (Addendum)
Anesthesia Evaluation  Patient identified by MRN, date of birth, ID band Patient awake    Reviewed: Allergy & Precautions, NPO status , Patient's Chart, lab work & pertinent test results  Airway Mallampati: II  TM Distance: >3 FB Neck ROM: Full    Dental  (+) Dental Advisory Given, Teeth Intact   Pulmonary former smoker,  breath sounds clear to auscultation        Cardiovascular hypertension, + Peripheral Vascular Disease + dysrhythmias + pacemaker Rhythm:Regular Rate:Normal     Neuro/Psych  Headaches, PSYCHIATRIC DISORDERS Anxiety    GI/Hepatic Neg liver ROS, GERD-  Medicated,  Endo/Other  Hypothyroidism   Renal/GU   negative genitourinary   Musculoskeletal  (+) Arthritis -,   Abdominal   Peds negative pediatric ROS (+)  Hematology negative hematology ROS (+)   Anesthesia Other Findings   Reproductive/Obstetrics                         Lab Results  Component Value Date   WBC 7.4 11/29/2014   HGB 13.2 11/29/2014   HCT 40.3 11/29/2014   MCV 91.0 11/29/2014   PLT 188 11/29/2014   Lab Results  Component Value Date   CREATININE 0.94 11/29/2014   BUN 12 11/29/2014   NA 137 11/29/2014   K 4.4 11/29/2014   CL 102 11/29/2014   CO2 26 11/29/2014   EKG: dual Paced.   Echo (2015) Study Conclusions  - Left ventricle: The cavity size was normal. Systolic function was mildly to moderately reduced. The estimated ejection fraction was in the range of 40% to 45%. On apical images the EF appears closer to 35 to 40%. Diffuse hypokinesis. Regional wall motion abnormalities cannot be excluded. Septal wall motion abnormality secondary to underlying conduction abnormality. Left ventricular diastolic function parameters were normal. - Mitral valve: There was mild regurgitation. - Left atrium: The atrium was mildly dilated. - Right ventricle: Systolic function was normal. - Pulmonary  arteries: Systolic pressure was within the normal range.  Anesthesia Physical Anesthesia Plan  ASA: III  Anesthesia Plan: General   Post-op Pain Management:    Induction: Intravenous  Airway Management Planned: Oral ETT  Additional Equipment: Arterial line and 3D TEE  Intra-op Plan:   Post-operative Plan: Extubation in OR  Informed Consent: I have reviewed the patients History and Physical, chart, labs and discussed the procedure including the risks, benefits and alternatives for the proposed anesthesia with the patient or authorized representative who has indicated his/her understanding and acceptance.   Dental advisory given  Plan Discussed with: CRNA, Anesthesiologist and Surgeon  Anesthesia Plan Comments:        Anesthesia Quick Evaluation

## 2014-12-06 NOTE — OR Nursing (Signed)
Explanted pulse generator: [employer] DR [id number] Extracted leads: DO17/11/96 RA 1188t/46 , [id number] RV 1246t/52, [id number]

## 2014-12-06 NOTE — Progress Notes (Signed)
  Echocardiogram Echocardiogram Transesophageal has been performed.  Darlina Sicilian M 12/06/2014, 12:10 PM

## 2014-12-06 NOTE — Anesthesia Postprocedure Evaluation (Signed)
  Anesthesia Post-op Note  Patient: [name] [name]  Procedure(s) Performed: Procedure(s) with comments: REMOVAL OF RV AND RA PACEMAKER LEADS (Right) - DR. BARTLE TO BACK UP  Implantation of cardiac resynchronization therapy pacemaker (Left)  Patient Location: PACU  Anesthesia Type:General  Level of Consciousness: awake, alert  and oriented  Airway and Oxygen Therapy: Patient Spontanous Breathing  Post-op Pain: mild  Post-op Assessment: Post-op Vital signs reviewed and Patient's Cardiovascular Status Stable   LLE Sensation: Full sensation   RLE Sensation: Full sensation      Post-op Vital Signs: Reviewed and stable  Last Vitals:  Filed Vitals:   12/06/14 1645  BP:   Pulse: 93  Temp:   Resp: 10    Complications: No apparent anesthesia complications

## 2014-12-06 NOTE — Progress Notes (Signed)
Report given to emily rn as caregiver rn

## 2014-12-07 ENCOUNTER — Inpatient Hospital Stay (HOSPITAL_COMMUNITY): Payer: BLUE CROSS/BLUE SHIELD

## 2014-12-07 ENCOUNTER — Encounter (HOSPITAL_COMMUNITY): Payer: Self-pay | Admitting: Internal Medicine

## 2014-12-07 DIAGNOSIS — T82190A Other mechanical complication of cardiac electrode, initial encounter: Secondary | ICD-10-CM | POA: Diagnosis not present

## 2014-12-07 DIAGNOSIS — I429 Cardiomyopathy, unspecified: Secondary | ICD-10-CM | POA: Diagnosis not present

## 2014-12-07 DIAGNOSIS — I442 Atrioventricular block, complete: Secondary | ICD-10-CM | POA: Diagnosis not present

## 2014-12-07 DIAGNOSIS — E039 Hypothyroidism, unspecified: Secondary | ICD-10-CM | POA: Diagnosis not present

## 2014-12-07 NOTE — Evaluation (Signed)
Physical Therapy Evaluation Patient Details Name: [name] [name] MRN: [id number] DOB: 20-Mar-1956 Today's Date: 12/07/2014   History of Present Illness  59 yo woman with complete heart block, s/p PPM insertion in 1996. She underwent generator change out in 2012. She had developed noise on her atrial and ventricular leads. Posted for pacemaker lead removal, left chest on 12/06/14 by Dr. Lovena Le. History includes former smoker, tachycardia induced cardiomyopathy, remote history apical thrombus treated with warfarin, PAD s/p left CIA stent '11, ovarian cancer s/p TAH/BSO '10, GERD, HLD, hypothyroidism, right THA 10/31/14.  Clinical Impression  Patient in bed, agreeable to participate in PT today. Patient participated in transfers and exercise as described below. Educated patient thoroughly on NWB status of Bil UE and process of returning Bil UE to weight bearing per precautions handout. Will return tomorrow AM to address ambulation and stairs when patient becomes WB for use of RW or cane. Patient will benefit from continued PT to maintain hip function improvements while in the hospital as well as to educate and return patient to ambulation as allowed by her precautions.    Follow Up Recommendations Outpatient PT;Supervision - Intermittent    Equipment Recommendations  None recommended by PT    Recommendations for Other Services       Precautions / Restrictions Precautions Precautions: Anterior Hip;Fall;Other (comment);ICD/Pacemaker (Bil PM precautions.) Precaution Booklet Issued: Yes (comment) (shoulder precautions sheet) Precaution Comments: Patient curious about timeline to return to driving, educated to ask MD about these restrictions. Restrictions Weight Bearing Restrictions: Yes RUE Weight Bearing: Non weight bearing LUE Weight Bearing: Non weight bearing      Mobility  Bed Mobility Overal bed mobility: Needs Assistance Bed Mobility: Supine to Sit     Supine to sit: Mod assist;HOB  elevated     General bed mobility comments: Needed assistance for trunk elevation due to inability to WB through arms. Able to scoot hips forward and bil LE to EOB.  Transfers Overall transfer level: Needs assistance Equipment used: 1 person hand held assist Transfers: Sit to/from [facility] Sit to Stand: Min assist Stand pivot transfers: Min guard       General transfer comment: Patient required minimal physical assistance to get to standing due to weak R LE as well as NWB precautions for Bil UE. Patient steady on feet, able to slowly pivot to recliner with min guard assist.  Ambulation/Gait             General Gait Details: Gait is deferred until patient is WBAT through Bil UE for ability to use RW or cane.  Stairs            Wheelchair Mobility    Modified Rankin (Stroke Patients Only)       Balance Overall balance assessment: Modified Independent Sitting-balance support: Feet supported;No upper extremity supported Sitting balance-Leahy Scale: Good     Standing balance support: No upper extremity supported Standing balance-Leahy Scale: Fair                               Pertinent Vitals/Pain Pain Assessment: 0-10 Pain Score: 3  Pain Location: Back Pain Descriptors / Indicators: Dull Pain Intervention(s): Monitored during session    Home Living Family/patient expects to be discharged to:: Private residence Living Arrangements: Spouse/significant other Available Help at Discharge: Family;Available PRN/intermittently (24/7 over the weekend.) Type of Home: House Home Access: Stairs to enter Entrance Stairs-Rails: None Entrance Stairs-Number of Steps: 2 Home  Layout: Two level;Able to live on main level with bedroom/bathroom Home Equipment: Gilford Rile - 2 wheels;Cane - single point;Bedside commode      Prior Function Level of Independence: Independent with assistive device(s)               Hand Dominance         Extremity/Trunk Assessment               Lower Extremity Assessment: RLE deficits/detail RLE Deficits / Details: Still recovering from R THA. Participating well in OP PT.       Communication   Communication: No difficulties  Cognition Arousal/Alertness: Awake/alert Behavior During Therapy: WFL for tasks assessed/performed Overall Cognitive Status: Within Functional Limits for tasks assessed                      General Comments      Exercises General Exercises - Lower Extremity Ankle Circles/Pumps: AROM;Both;20 reps;Supine Long Arc Quad: AROM;Strengthening;Right;20 reps;Seated (2 sets of 10 reps, manual resistance on second set.) Hip Flexion/Marching: AROM;Strengthening;Both;Other reps (comment);Seated (1 set of 10 reps, 1 set of 20 reps manual resistance.)      Assessment/Plan    PT Assessment Patient needs continued PT services  PT Diagnosis Difficulty walking;Other (comment) (R THA)   PT Problem List Decreased strength;Decreased activity tolerance;Decreased mobility  PT Treatment Interventions Gait training;Stair training;Functional mobility training;Therapeutic activities;Therapeutic exercise;Patient/family education   PT Goals (Current goals can be found in the Care Plan section) Acute Rehab PT Goals Patient Stated Goal: Go home and return to PT for hip PT Goal Formulation: With patient Time For Goal Achievement: 12/21/14 Potential to Achieve Goals: Good    Frequency Min 3X/week   Barriers to discharge        Co-evaluation               End of Session Equipment Utilized During Treatment: Gait belt Activity Tolerance: Patient tolerated treatment well;Other (comment) (Limited by NWB Bil UE) Patient left: in chair;with call bell/phone within reach;Other (comment) (with compression stockings applied.) Nurse Communication: Mobility status         Time: 7588-3254 PT Time Calculation (min) (ACUTE ONLY): 38 min   Charges:   PT  Evaluation $Initial PT Evaluation Tier I: 1 Procedure PT Treatments $Therapeutic Activity: 8-22 mins   PT G CodesRoanna Epley, SPT [phone] 12/07/2014, 10:53 AM  I have read, reviewed and agree with student's note.   [place] [phone] (pager)

## 2014-12-07 NOTE — Progress Notes (Signed)
Patient ID: [name] [name], female   DOB: June 26, 1955, 59 y.o.   MRN: [id number]    Patient Name: [name] [name] Date of Encounter: 12/07/2014     Active Problems:   Fractured atrial pacemaker lead wire    SUBJECTIVE  Minimal chest pain. No sob. Incisions are sore.  CURRENT MEDS . aspirin EC  81 mg Oral Daily  . heparin subcutaneous  5,000 Units Subcutaneous 3 times per day  . levothyroxine  100 mcg Oral QAC breakfast  . meloxicam  15 mg Oral Daily  . pravastatin  10 mg Oral q1800    OBJECTIVE  Filed Vitals:   12/07/14 0200 12/07/14 0300 12/07/14 0400 12/07/14 0500  BP: 127/65 128/71 121/59 129/60  Pulse: 81 68 65 69  Temp:   98.8 F (37.1 C)   TempSrc:   Oral   Resp: 11 13 13 12   Height:      Weight:      SpO2: 95% 96% 96% 96%    Intake/Output Summary (Last 24 hours) at 12/07/14 0802 Last data filed at 12/07/14 0434  Gross per 24 hour  Intake   2890 ml  Output   2475 ml  Net    415 ml   Filed Weights   12/06/14 0720 12/06/14 1730  Weight: 183 lb (83.008 kg) 183 lb 10.3 oz (83.3 kg)    PHYSICAL EXAM  General: Pleasant, NAD. Neuro: Alert and oriented X 3. Moves all extremities spontaneously. Psych: Normal affect. HEENT:  Normal  Neck: Supple without bruits or JVD. Lungs:  Resp regular and unlabored, CTA. Heart: RRR no s3, s4, or murmurs. Abdomen: Soft, non-tender, non-distended, BS + x 4.  Extremities: No clubbing, cyanosis or edema. DP/PT/Radials 2+ and equal bilaterally.  Accessory Clinical Findings  CBC  Recent Labs  12/06/14 1825  WBC 7.0  HGB 11.4*  HCT 34.1*  MCV 88.3  PLT 614*   Basic Metabolic Panel  Recent Labs  12/06/14 1825  CREATININE 0.73   Liver Function Tests No results for input(s): AST, ALT, ALKPHOS, BILITOT, PROT, ALBUMIN in the last 72 hours. No results for input(s): LIPASE, AMYLASE in the last 72 hours. Cardiac Enzymes No results for input(s): CKTOTAL, CKMB, CKMBINDEX, TROPONINI in the last 72 hours. BNP Invalid  input(s): POCBNP D-Dimer No results for input(s): DDIMER in the last 72 hours. Hemoglobin A1C No results for input(s): HGBA1C in the last 72 hours. Fasting Lipid Panel No results for input(s): CHOL, HDL, LDLCALC, TRIG, CHOLHDL, LDLDIRECT in the last 72 hours. Thyroid Function Tests No results for input(s): TSH, T4TOTAL, T3FREE, THYROIDAB in the last 72 hours.  Invalid input(s): FREET3  TELE  NSR with ventricular pacing   Radiology/Studies  Dg Chest 2 View  12/07/2014   CLINICAL DATA:  Cardiac pacer.  EXAM: CHEST  2 VIEW  COMPARISON:  12/06/2014.  FINDINGS: Interim extubation. Cardiac pacer with lead tips in right atrium right ventricle. Stable heart size. Bibasilar subsegmental atelectasis and/or infiltrates. No pleural effusion or pneumothorax.  IMPRESSION: 1. Interim extubation. 2. Cardiac pacer stable position. Mild cardiomegaly, no pulmonary venous congestion. 3. Mild bibasilar subsegmental atelectasis.   Electronically Signed   By: Marcello Moores  Register   On: 12/07/2014 07:37   Dg Chest Portable 1 View  12/06/2014   CLINICAL DATA:  Pacemaker lead removal.  Surgical count discrepancy.  EXAM: PORTABLE CHEST - 1 VIEW  COMPARISON:  None.  FINDINGS: Endotracheal tube 2 cm from carina. LEFT-sided pacemaker with 2 continuous leads overlies stable enlarged cardiac silhouette.  Small LEFT effusion. Mild basilar atelectasis.  No radiopaque foreign body evident.  IMPRESSION: 1. No radiopaque foreign body evident. 2. LEFT-sided pacer without complication. Findings conveyed toOR nurse [place] 12/06/2014  at14:02.   Electronically Signed   By: Suzy Bouchard M.D.   On: 12/06/2014 14:02    ASSESSMENT AND PLAN  1.. Broken atrial and ventricular lead 2. Complete heart block 3. S/p Insertion of a new DDD PM Rec: will watch in the hospital today with plans to discharge home tomorrow.   Gregg Taylor,M.D.   Gregg Taylor,M.D.  12/07/2014 8:02 AM

## 2014-12-07 NOTE — Progress Notes (Signed)
UR Completed. Idalys Konecny, RN, BSN.  [phone] 

## 2014-12-08 DIAGNOSIS — I442 Atrioventricular block, complete: Secondary | ICD-10-CM | POA: Diagnosis not present

## 2014-12-08 DIAGNOSIS — T82190A Other mechanical complication of cardiac electrode, initial encounter: Secondary | ICD-10-CM | POA: Diagnosis not present

## 2014-12-08 LAB — TYPE AND SCREEN
ABO/RH(D): O POS
Antibody Screen: NEGATIVE
UNIT DIVISION: 0
UNIT DIVISION: 0
Unit division: 0
Unit division: 0

## 2014-12-08 NOTE — Progress Notes (Signed)
Patient ID: Roniyah D Dace, female   DOB: 04/05/1956, 59 y.o.   MRN: [id number]    Patient Name: [name] [name] Date of Encounter: 12/08/2014     Active Problems:   Fractured atrial pacemaker lead wire    SUBJECTIVE  No chest pain or sob. Still sore. Feels palpitations.  CURRENT MEDS . aspirin EC  81 mg Oral Daily  . heparin subcutaneous  5,000 Units Subcutaneous 3 times per day  . levothyroxine  100 mcg Oral QAC breakfast  . meloxicam  15 mg Oral Daily  . pravastatin  10 mg Oral q1800    OBJECTIVE  Filed Vitals:   12/08/14 0400 12/08/14 0500 12/08/14 0800 12/08/14 0945  BP: 119/65 137/73 125/65 107/73  Pulse: 69 73 75 120  Temp: 97.5 F (36.4 C)  97.9 F (36.6 C)   TempSrc: Oral  Oral   Resp: 14 17 15    Height:      Weight:      SpO2: 93% 94% 95% 95%    Intake/Output Summary (Last 24 hours) at 12/08/14 1123 Last data filed at 12/08/14 0800  Gross per 24 hour  Intake    600 ml  Output   1325 ml  Net   -725 ml   Filed Weights   12/06/14 0720 12/06/14 1730  Weight: 183 lb (83.008 kg) 183 lb 10.3 oz (83.3 kg)    PHYSICAL EXAM  General: Pleasant, NAD. Neuro: Alert and oriented X 3. Moves all extremities spontaneously. Psych: Normal affect. HEENT:  Normal  Neck: Supple without bruits or JVD. Lungs:  Resp regular and unlabored, CTA. Heart: RRR no s3, s4, or murmurs. Abdomen: Soft, non-tender, non-distended, BS + x 4.  Extremities: No clubbing, cyanosis or edema. DP/PT/Radials 2+ and equal bilaterally. No redness or blistering on back.  Accessory Clinical Findings  CBC  Recent Labs  12/06/14 1825  WBC 7.0  HGB 11.4*  HCT 34.1*  MCV 88.3  PLT 944*   Basic Metabolic Panel  Recent Labs  12/06/14 1825  CREATININE 0.73   Liver Function Tests No results for input(s): AST, ALT, ALKPHOS, BILITOT, PROT, ALBUMIN in the last 72 hours. No results for input(s): LIPASE, AMYLASE in the last 72 hours. Cardiac Enzymes No results for input(s): CKTOTAL, CKMB,  CKMBINDEX, TROPONINI in the last 72 hours. BNP Invalid input(s): POCBNP D-Dimer No results for input(s): DDIMER in the last 72 hours. Hemoglobin A1C No results for input(s): HGBA1C in the last 72 hours. Fasting Lipid Panel No results for input(s): CHOL, HDL, LDLCALC, TRIG, CHOLHDL, LDLDIRECT in the last 72 hours. Thyroid Function Tests No results for input(s): TSH, T4TOTAL, T3FREE, THYROIDAB in the last 72 hours.  Invalid input(s): FREET3  TELE  nsr with ventricular pacing. Possible PMT vs atrial tachy with ventricular pacing  Radiology/Studies  Dg Chest 2 View  12/07/2014   CLINICAL DATA:  Cardiac pacer.  EXAM: CHEST  2 VIEW  COMPARISON:  12/06/2014.  FINDINGS: Interim extubation. Cardiac pacer with lead tips in right atrium right ventricle. Stable heart size. Bibasilar subsegmental atelectasis and/or infiltrates. No pleural effusion or pneumothorax.  IMPRESSION: 1. Interim extubation. 2. Cardiac pacer stable position. Mild cardiomegaly, no pulmonary venous congestion. 3. Mild bibasilar subsegmental atelectasis.   Electronically Signed   By: Marcello Moores  Register   On: 12/07/2014 07:37   Dg Chest Portable 1 View  12/06/2014   CLINICAL DATA:  Pacemaker lead removal.  Surgical count discrepancy.  EXAM: PORTABLE CHEST - 1 VIEW  COMPARISON:  None.  FINDINGS:  Endotracheal tube 2 cm from carina. LEFT-sided pacemaker with 2 continuous leads overlies stable enlarged cardiac silhouette. Small LEFT effusion. Mild basilar atelectasis.  No radiopaque foreign body evident.  IMPRESSION: 1. No radiopaque foreign body evident. 2. LEFT-sided pacer without complication. Findings conveyed toOR nurse [place] 12/06/2014  at14:02.   Electronically Signed   By: Suzy Bouchard M.D.   On: 12/06/2014 14:02     ASSESSMENT AND PLAN  1. Atrial and ventricular PM lead dysfunction 2. Complete heart block 3. S/p PM lead extraction and insertion of a new PPM Rec: patient appears stable for discharge. She has no  evidence of radiation burn. I would like the patient to return for a device check and skin check looking for radiation burns on the back due to long procedure duration/Xray exposure in 5-7 days in our device clinic.   [name] [name],M.D.  12/08/2014 11:23 AM

## 2014-12-08 NOTE — Progress Notes (Signed)
Physical Therapy Treatment Patient Details Name: [name] [name] MRN: [id number] DOB: April 16, 1955 Today's Date: 12/08/2014    History of Present Illness 59 yo woman with complete heart block, s/p PPM insertion in 1996. She underwent generator change out in 2012. She had developed noise on her atrial and ventricular leads. Posted for pacemaker lead removal, left chest on 12/06/14 by Dr. Lovena Le. History includes former smoker, tachycardia induced cardiomyopathy, remote history apical thrombus treated with warfarin, PAD s/p left CIA stent '11, ovarian cancer s/p TAH/BSO '10, GERD, HLD, hypothyroidism, right THA 10/31/14.    PT Comments    Patient seated in recliner and agreeable to participate in PT today. Patient was able to ambulate and navigate stairs as described below.  See Vitals below to see pertinent vitals during session. Patient will benefit from continued PT while in the hospital to maintain progress with R hip and increase independence with stairs.   Follow Up Recommendations  Outpatient PT;Supervision - Intermittent     Equipment Recommendations  None recommended by PT    Recommendations for Other Services       Precautions / Restrictions Precautions Precautions: Anterior Hip;Fall;Other (comment);ICD/Pacemaker Restrictions Weight Bearing Restrictions: Yes RUE Weight Bearing: Weight bearing as tolerated LUE Weight Bearing: Weight bearing as tolerated    Mobility  Bed Mobility               General bed mobility comments: Patient in recliner upon PT arrival. States she may need assistance at home since she cannot use her arms entirely, but states that husband is able to provide this assistance until Sunday when she will be FWB through Bil UE.  Transfers Overall transfer level: Independent   Transfers: Sit to/from Stand Sit to Stand: Independent         General transfer comment: Patient required no physical assistance and was able to maintain stable standing  without use of the RW or HHA.  Ambulation/Gait Ambulation/Gait assistance: Min guard Ambulation Distance (Feet): 200 Feet Assistive device: Rolling walker (2 wheeled) Gait Pattern/deviations: Decreased stride length;Trunk flexed   Gait velocity interpretation: at or above normal speed for age/gender General Gait Details: Patient was cleared for WBAT through Bil UE using the RW. Patient able to ambulate with minimal use of RW, states that she will use it for the next few days for added stability in case she needs it. Was able to walk and talk with ease. States she prefers supervision, which husband can provide, over the weekend for ambulation.   Stairs Stairs: Yes Stairs assistance: Min guard Stair Management: One rail Right;Step to pattern Number of Stairs: 2 General stair comments: Used rail instead of cane since one was not available. Required min guard but no physical assistance. Patient able to verbalize correct sequencing of stairs during ambulation.  Wheelchair Mobility    Modified Rankin (Stroke Patients Only)       Balance Overall balance assessment: Independent Sitting-balance support: Feet supported;No upper extremity supported Sitting balance-Leahy Scale: Normal     Standing balance support: No upper extremity supported Standing balance-Leahy Scale: Good                      Cognition Arousal/Alertness: Awake/alert Behavior During Therapy: WFL for tasks assessed/performed Overall Cognitive Status: Within Functional Limits for tasks assessed                      Exercises      General Comments  Pertinent Vitals/Pain Pain Assessment: No/denies pain  HR at rest: 95-105 bpm HR during ambulation: 115-120 bpm, very brief peak at 125 bpm    Home Living                      Prior Function            PT Goals (current goals can now be found in the care plan section) Acute Rehab PT Goals Patient Stated Goal: Go back to  work PT Goal Formulation: With patient Time For Goal Achievement: 12/21/14 Potential to Achieve Goals: Good Progress towards PT goals: Progressing toward goals    Frequency  Min 3X/week    PT Plan Current plan remains appropriate    Co-evaluation             End of Session Equipment Utilized During Treatment: Gait belt Activity Tolerance: Patient tolerated treatment well Patient left: in chair;with call bell/phone within reach     Time: 0924-0939 PT Time Calculation (min) (ACUTE ONLY): 15 min  Charges:                       G CodesRoanna Epley, SPT [phone] 12/08/2014, 10:00 AM  I have read, reviewed and agree with student's note.   [place] [phone] (pager)

## 2014-12-08 NOTE — Discharge Summary (Signed)
Physician Discharge Summary  Patient ID: [name] [name] MRN: [id number] DOB/AGE: 17-Oct-1955 59 y.o.    Electrophysiologist: Dr. Caryl Comes  Admit date: 12/06/2014 Discharge date: 12/08/2014  Admission Diagnoses: Fractured Atrial Pacemaker Lead Wire  Discharge Diagnoses:  Active Problems:   Fractured atrial pacemaker lead wire   Discharged Condition: stable  Hospital Course: 59 y/o female referred to Dr. Lovena Le by Dr. Caryl Comes for consideration for PM lead extraction. She has a h/o CHB and underwent PPM insertion in 1996. She underwent generator change out in 2012. She had developed noise on her atrial and ventricular leads. Her PPM has been reprogrammed DOOR from DDDR. She was seen by Dr. Lovena Le in clinic who agreed she needed revision. She presented to [facility] on [facility]14 to undergo the planned procedure.  She underwent successful extraction of her old dual chamber pacing system followed by insertion of a new dual-chamber pacing system on the contralateral side. She tolerated the procedure well and left the OR in stable condition. She was monitored and had no major post-op complications. No chest pain or dyspnea. Post-operative CXR showed proper lead placement and no pneumothorax. She was last seen and examined by Dr. Lovena Le who determined she was stable for discharge home. At time of discharge, she had no evidence of radiation burn. However, Dr. Lovena Le instructed her to return for a device and skin check looking for radiation burns on the back due to long procedure duration/Xray exposure in 5-7 days in the device clinic. This has been arranged for 12/14/14.  Consults: None   Treatments:  PPM extraction + implantation of new PPM  See OP note for details  Discharge Exam: Blood pressure 137/66, pulse 68, temperature 98 F (36.7 C), temperature source Oral, resp. rate 10, height 5\' 5"  (1.651 m), weight 183 lb 10.3 oz (83.3 kg), SpO2 98 %.   Disposition: 03-Skilled Nursing Facility      Discharge  Instructions    Diet - low sodium heart healthy    Complete by:  As directed      Increase activity slowly    Complete by:  As directed             Medication List    TAKE these medications        aspirin 81 MG tablet  Take 81 mg by mouth daily.     clonazePAM 0.5 MG tablet  Commonly known as:  KLONOPIN  Take 1 tablet (0.5 mg total) by mouth daily as needed for anxiety (sleep).     levothyroxine 100 MCG tablet  Commonly known as:  SYNTHROID, LEVOTHROID  Take 1 tablet (100 mcg total) by mouth daily.     lovastatin 20 MG tablet  Commonly known as:  MEVACOR  Take 1 tablet (20 mg total) by mouth at bedtime.     meloxicam 15 MG tablet  Commonly known as:  MOBIC  Take 15 mg by mouth daily.     oxyCODONE 5 MG immediate release tablet  Commonly known as:  Oxy IR/ROXICODONE  Take 1-2 tablets (5-10 mg total) by mouth every 3 (three) hours as needed for breakthrough pain.     ranitidine 150 MG capsule  Commonly known as:  ZANTAC  Take 150 mg by mouth 2 (two) times daily as needed for heartburn.       Follow-up Information    Follow up with [facility] On 12/14/2014.   Why:  For wound re-check, For suture removal 4:00 pm    Contact information:  [address] [place] [address]     TIME SPENT ON DISCHARGE, INCLUDING PHYSICIAN TIME: > 30 MINUTES  Signed: Lyda Jester 12/08/2014, 1:55 PM  EP Attending  Patient seen and examined. Agree with above history, exam, assessment and plan. Patient is stable for discharge. She will need to return in 5 days for skin survey as she received excessive Xray exposure during extraction procedure.  Mikle [place].D.

## 2014-12-11 ENCOUNTER — Encounter: Payer: Self-pay | Admitting: Surgery

## 2014-12-11 NOTE — Progress Notes (Signed)
Patient ID: [name] [name], female   DOB: [birth year], 59 y.o.   MRN: [id number]  I provided surgical backup for this complicated 59 year old pacemaker extraction from 8:30 am until 12:00 Noon.

## 2014-12-11 NOTE — Progress Notes (Signed)
patient called earlier and stated she did not feel like she needed this fu, she is going to go [employer]. She was confused why she has both of them, so she will keep the device in [place].

## 2014-12-12 ENCOUNTER — Encounter (HOSPITAL_COMMUNITY): Payer: Self-pay | Admitting: Internal Medicine

## 2014-12-12 ENCOUNTER — Encounter: Payer: Self-pay | Admitting: Unknown Physician Specialty

## 2014-12-14 ENCOUNTER — Ambulatory Visit (INDEPENDENT_AMBULATORY_CARE_PROVIDER_SITE_OTHER): Payer: BLUE CROSS/BLUE SHIELD | Admitting: *Deleted

## 2014-12-14 ENCOUNTER — Encounter (HOSPITAL_COMMUNITY): Payer: Self-pay | Admitting: Internal Medicine

## 2014-12-14 ENCOUNTER — Encounter: Payer: BLUE CROSS/BLUE SHIELD | Admitting: Physician Assistant

## 2014-12-14 DIAGNOSIS — I442 Atrioventricular block, complete: Secondary | ICD-10-CM | POA: Diagnosis not present

## 2014-12-14 LAB — CUP PACEART INCLINIC DEVICE CHECK
Lead Channel Impedance Value: 425 Ohm
Lead Channel Pacing Threshold Amplitude: 1 V
Lead Channel Pacing Threshold Amplitude: 1 V
Lead Channel Pacing Threshold Pulse Width: 0.5 ms
Lead Channel Pacing Threshold Pulse Width: 0.5 ms
Lead Channel Pacing Threshold Pulse Width: 0.5 ms
Lead Channel Sensing Intrinsic Amplitude: 3 mV
Lead Channel Sensing Intrinsic Amplitude: 6.5 mV
Lead Channel Setting Pacing Amplitude: 3.5 V
MDC IDC MSMT BATTERY REMAINING LONGEVITY: 64.8 mo
MDC IDC MSMT BATTERY VOLTAGE: 3.1 V
MDC IDC MSMT LEADCHNL RA PACING THRESHOLD AMPLITUDE: 0.5 V
MDC IDC MSMT LEADCHNL RA PACING THRESHOLD AMPLITUDE: 0.5 V
MDC IDC MSMT LEADCHNL RA PACING THRESHOLD PULSEWIDTH: 0.5 ms
MDC IDC MSMT LEADCHNL RV IMPEDANCE VALUE: 487.5 Ohm
MDC IDC PG SERIAL: 7802897
MDC IDC SESS DTM: 20160901162939
MDC IDC SET LEADCHNL RA PACING AMPLITUDE: 3.5 V
MDC IDC SET LEADCHNL RV PACING PULSEWIDTH: 0.5 ms
MDC IDC SET LEADCHNL RV SENSING SENSITIVITY: 4 mV
MDC IDC STAT BRADY RA PERCENT PACED: 0.15 %
MDC IDC STAT BRADY RV PERCENT PACED: 99.7 %

## 2014-12-14 NOTE — Progress Notes (Signed)
Wound check appointment. Steri-strips removed. Wound without redness or edema. Incision edges approximated, wound well healed. Normal device function. Thresholds, sensing, and impedances consistent with implant measurements. Device programmed at 3.5V/auto capture programmed on for extra safety margin until 3 month visit. Histogram distribution appropriate for patient and level of activity. 121 mode switches- longest 3.5 mins, pk A 210, pk V 84. No high ventricular rates noted. Patient educated about wound care, arm mobility, lifting restrictions. ROV with [facility] 12-19-14.

## 2014-12-17 NOTE — Progress Notes (Signed)
Patient Care Team: Guadalupe Maple, MD as PCP - General   HPI  [name] [name] is a 59 y.o. female Seen to reestablish pacemaker followup. 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 This was initially implanted in 199.  8/ 16 She underwent extraction as there was noise on both her atrial and ventricular leads and new device implantation contralaterally.  There was concern for radiation injury given prolonged exposure  She has a history of a nonischemic cardiomyopathy; she has significant peripheral vascular disease with remote revascularization.  Ejection fraction by echo 2010 40-45% with mild left atrial enlargement.  Repeat echocardiogram 7/15 showed no significant interval change 40--45%;  myoview 7/16 EF 40 % and no ischemia   Review of radiation injury suggests that all levels of damage are associated with acute eyrhthma   She has had no lightheadedness or syncope.  She denies shortness of breath dyspnea on exertion or pedal edema Past Medical History  Diagnosis Date  . Hyperlipidemia     takes Lovastatin daily  . Cardiomyopathy     tachycardia induced. EF 35-40% 06/2008 "patient denies"  . 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 Complete AV block     s/p pacemaker insertion 1996  . Peripheral artery disease     stent on lt illiac  . Peripheral vascular disease   . Ovarian cancer     ovarian, skin   . GERD (gastroesophageal reflux disease)     takes Zantac daily as needed  . Hypothyroid     takes Synthroid daily  . Anxiety     takes Klonopin daily as needed  . Hypertension     hx of-since hip replacement MD took resident off meds 10/30/14 b/c well controlled  . History of blood clots     in heart-was on Coumadin---this many yrs ago  . History of bronchitis 11yrs ago  . Headache     occasionally  . Dizziness     occasionally  . Arthritis   . Joint pain    . Joint swelling   . Numbness and tingling     left leg and right arm  . Back pain     reason unknown  . History of colon polyps     benign  . Urinary frequency   . Nocturia     Past Surgical History  Procedure Laterality Date  . Pacemaker insertion  2012    Had replaced in July 2012, first placed in 1996  . Knee surgery Right   . Total abdominal hysterectomy w/ bilateral salpingoophorectomy  07/2008    Ovarian cancer  . Insert / replace / remove pacemaker    . Iliac artery stent  Apr 05, 2010    Left artery stenting  . Angioplasty / stenting iliac    . Abdominal hysterectomy    . Hernia mesh removal Right     incisional  . Total hip arthroplasty Right 10/31/2014    Procedure: TOTAL HIP ARTHROPLASTY ANTERIOR APPROACH;  Surgeon: Hessie Knows, MD;  Location: [facility] ORS;  Service: Orthopedics;  Laterality: Right;  . Cervical cerclage  70yrs ago  . Dilation and curettage of uterus    . Colonoscopy    . Dg barium swallow ([facility] hx)    . Pacemaker lead removal Right 12/06/2014    Procedure: REMOVAL OF RV AND RA PACEMAKER LEADS;  Surgeon: Evans Lance, MD;  Location: [facility]  OR;  Service: Cardiovascular;  Laterality: Right;  DR. BARTLE TO BACK UP   . Ep implantable device Left 12/06/2014    Procedure: Implantation of cardiac resynchronization therapy pacemaker;  Surgeon: Evans Lance, MD;  Location: [facility] OR;  Service: Cardiovascular;  Laterality: Left;    Current Outpatient Prescriptions  Medication Sig Dispense Refill  . aspirin 81 MG tablet Take 81 mg by mouth daily.      . clonazePAM (KLONOPIN) 0.5 MG tablet Take 1 tablet (0.5 mg total) by mouth daily as needed for anxiety (sleep). 30 tablet 0  . levothyroxine (SYNTHROID, LEVOTHROID) 100 MCG tablet Take 1 tablet (100 mcg total) by mouth daily. 30 tablet 1  . lovastatin (MEVACOR) 20 MG tablet Take 1 tablet (20 mg total) by mouth at bedtime. 30 tablet 3  . meloxicam (MOBIC) 15 MG tablet Take 15 mg by mouth daily.    Marland Kitchen oxyCODONE (OXY  IR/ROXICODONE) 5 MG immediate release tablet Take 1-2 tablets (5-10 mg total) by mouth every 3 (three) hours as needed for breakthrough pain. 60 tablet 0  . ranitidine (ZANTAC) 150 MG capsule Take 150 mg by mouth 2 (two) times daily as needed for heartburn.     No current facility-administered medications for this visit.    Allergies  Allergen Reactions  . Marshmallow [Althaea Officinalis]     Rash, hives, itching, SOB  . Meat Extract     Rash, hives itching, SOB.  Can tolerate some meats ONLY in moderation  . Other     Jello - Rash, hives, itching, SOB  . Contrast Media [Iodinated Diagnostic Agents]     unknown  . Latex     rash  . Nickel     rash    Review of Systems negative except from HPI and PMH  Physical Exam BP 110/68 mmHg  Pulse 91  Ht 5\' 5"  (1.651 m)  Wt 181 lb (82.101 kg)  BMI 30.12 kg/m2 Well developed and well nourished in no acute distress HENT normal E scleral and icterus clear Neck Supple JVP flat; carotids brisk and full Clear to ausculation  Regular rate and rhythm, no murmurs gallops or rub Soft with active bowel sounds No clubbing cyanosis  Edema Alert and oriented, grossly normal motor and sensory function Skin Warm and Dry  ECG demonstrates P. Synchronous pacing  Assessment and  Plan  Complete heart block-congenital  Pacemaker-[employer]  NonsichemicCardiomyopathy  Peripheral vascular disease status post iliac stenting  Pacemaker lead revision     With her cardiomyopathy, we will resume her Lotensin at 5 mg daily. If she is able to tolerate it from a blood pressure point of view, we will add back a beta blocker.  She is currently euvolemic.

## 2014-12-19 ENCOUNTER — Encounter: Payer: Self-pay | Admitting: Internal Medicine

## 2014-12-19 ENCOUNTER — Ambulatory Visit (INDEPENDENT_AMBULATORY_CARE_PROVIDER_SITE_OTHER): Payer: BLUE CROSS/BLUE SHIELD | Admitting: Internal Medicine

## 2014-12-19 VITALS — BP 110/68 | HR 91 | Ht 65.0 in | Wt 181.0 lb

## 2014-12-19 DIAGNOSIS — I429 Cardiomyopathy, unspecified: Secondary | ICD-10-CM | POA: Diagnosis not present

## 2014-12-19 DIAGNOSIS — I442 Atrioventricular block, complete: Secondary | ICD-10-CM

## 2014-12-19 DIAGNOSIS — Z950 Presence of cardiac pacemaker: Secondary | ICD-10-CM

## 2014-12-19 MED ORDER — BENAZEPRIL HCL 10 MG PO TABS: 10.0000 mg | ORAL_TABLET | Freq: Every day | ORAL | Status: DC

## 2014-12-19 NOTE — Patient Instructions (Signed)
Medication Instructions:  Your physician has recommended you make the following change in your medication:  START taking lotensin 10mg  once per day   Labwork: none  Testing/Procedures: none  Follow-Up: Your physician recommends that you schedule a follow-up appointment in: three months with Dr. Caryl Comes   Any Other Special Instructions Will Be Listed Below (If Applicable).

## 2014-12-29 ENCOUNTER — Telehealth: Payer: Self-pay

## 2014-12-29 NOTE — Telephone Encounter (Signed)
Pt states she had a pacer implant on 8/24, states she was in here last week. States she thinks something, "is not just right", states she gets winded, she feels like she is not breathing like she should be. States she does have chest pain when she exerts herself. States after she walked yesterday, her HR was 116. States her left ankle is swollen. Denies no chest pain today. Please call.

## 2014-12-29 NOTE — Telephone Encounter (Signed)
-  S/w pt who states when she was at 9/6 OV, she  States when she walks, HR elevates and she gets winded.  This has been happening since pacemaker implanted. Discussed having pacemaker checked. Pt agreeable. States she is off work today and can come to the office at any time.  -Left message for [place] at [place] to call back regarding pacemaker check.  -Left message for pt of contact with [employer] and will call her back when I speak with [place]. Pt verbalized understanding.

## 2014-12-29 NOTE — Telephone Encounter (Signed)
Called [employer] on call service at [phone] Service to page Karilyn Cota

## 2014-12-29 NOTE — Telephone Encounter (Signed)
S/w Lorella Nimrod, [phone], from [place] who suggests pt send transmission so she can look at it. Pt may call her if she has questions and to notify her when transmission sent. Jarrett Soho to call back after transmission received and analyzed to determine if she needs pacemaker check today  S/w pt regarding recommendations. Provided pt w/ Hannah's phone number. Pt states she is currently eating lunch. Will go home and send transmission.

## 2014-12-29 NOTE — Telephone Encounter (Signed)
S/w Jarrett Soho at [facility] who states she received transmission from pt. Reports atrial tach 192 times since 12/19/14, lasting 4-6 seconds each time. Atrial tach this morning 6-7am stating it was a "fair amount" however, she states there is nothing that she would change at this time.  Jarrett Soho states she will call patient to explain findings.  -S/w pt who states Jarrett Soho explained findings. Pt verbalized understanding as this is her third pacemaker. States she will be ok this weekend but would like to be seen as she continues to be "winded" when she is active. Forward to scheduling for appt Monday or Tuesday. Pt agreeable with plan

## 2015-01-02 ENCOUNTER — Ambulatory Visit
Admission: RE | Admit: 2015-01-02 | Discharge: 2015-01-02 | Disposition: A | Discharge status: Home / Self Care | Payer: BLUE CROSS/BLUE SHIELD | Source: Ambulatory Visit | Attending: Internal Medicine | Admitting: Internal Medicine

## 2015-01-02 ENCOUNTER — Other Ambulatory Visit: Payer: Self-pay

## 2015-01-02 ENCOUNTER — Encounter: Payer: Self-pay | Admitting: Internal Medicine

## 2015-01-02 ENCOUNTER — Ambulatory Visit (INDEPENDENT_AMBULATORY_CARE_PROVIDER_SITE_OTHER): Payer: BLUE CROSS/BLUE SHIELD | Admitting: Internal Medicine

## 2015-01-02 ENCOUNTER — Encounter: Payer: BLUE CROSS/BLUE SHIELD | Admitting: *Deleted

## 2015-01-02 ENCOUNTER — Telehealth: Payer: Self-pay

## 2015-01-02 ENCOUNTER — Other Ambulatory Visit
Admission: RE | Admit: 2015-01-02 | Discharge: 2015-01-02 | Disposition: A | Discharge status: Home / Self Care | Payer: BLUE CROSS/BLUE SHIELD | Source: Ambulatory Visit | Attending: Internal Medicine | Admitting: Internal Medicine

## 2015-01-02 ENCOUNTER — Other Ambulatory Visit: Payer: Self-pay | Admitting: Internal Medicine

## 2015-01-02 VITALS — BP 110/72 | HR 67 | Ht 65.0 in | Wt 183.0 lb

## 2015-01-02 DIAGNOSIS — I82403 Acute embolism and thrombosis of unspecified deep veins of lower extremity, bilateral: Secondary | ICD-10-CM

## 2015-01-02 DIAGNOSIS — R0602 Shortness of breath: Secondary | ICD-10-CM

## 2015-01-02 DIAGNOSIS — M79606 Pain in leg, unspecified: Secondary | ICD-10-CM | POA: Insufficient documentation

## 2015-01-02 DIAGNOSIS — R000 Tachycardia, unspecified: Secondary | ICD-10-CM

## 2015-01-02 LAB — FIBRIN DERIVATIVES D-DIMER (ARMC ONLY): FIBRIN DERIVATIVES D-DIMER (ARMC): 1624 — AB (ref 0–499)

## 2015-01-02 NOTE — Telephone Encounter (Signed)
S/w Vicente Males at [facility], ultra sound dept, who states preliminary LE venous duplex negative for DVT.  Notified Dr. Caryl Comes.   CT angio PE protocol order placed. Noted to contact Dr. Caryl Comes at [phone] for results. Pt to wait for results before leaving hospital. Contacted scheduling, order placed as Stat. Notified pt who states she will go to hospital now for CT. Dr. Caryl Comes states pt can stay on same dosage of benazepril

## 2015-01-02 NOTE — Telephone Encounter (Signed)
Per Dr. Caryl Comes, VQ scan order then cancelled. Order for d dimer and echo. D dimer results to be called to [place].  Per verbal from Caryl Comes, ok for echo 9/22, 4pm Notified pt of appt who is agreeable.

## 2015-01-02 NOTE — Patient Instructions (Addendum)
Medication Instructions:  Your physician recommends that you continue on your current medications as directed. Please refer to the Current Medication list given to you today.   Labwork: none  Testing/Procedures: Your physician has requested that you have a lower  extremity venous duplex. This test is an ultrasound of the veins in the legs. It looks at venous blood flow that carries blood from the heart to the legs. Allow one hour for a Lower Venous exam. . There are no restrictions or special instructions.  [place] Arrival time: 1:30pm   Follow-Up: Your physician recommends that you schedule a follow-up appointment after your tests.   Any Other Special Instructions Will Be Listed Below (If Applicable).

## 2015-01-02 NOTE — Progress Notes (Signed)
Patient Care Team: [name], NP as PCP - General (Nurse Practitioner)   HPI  [name] [name] is a 59 y.o. female Seen to reestablish pacemaker followup. 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 This was initially implanted in 199.  8/ 16 She underwent extraction as there was noise on both her atrial and ventricular leads and new device implantation contralaterally.  There was concern for radiation injury given prolonged exposure  She has a history of a nonischemic cardiomyopathy; she has significant peripheral vascular disease with remote revascularization.  Ejection fraction by echo 2010 40-45% with mild left atrial enlargement.  Repeat echocardiogram 7/15 showed no significant interval change 40--45%;  myoview 7/16 EF 40 % and no ischemia   Review of radiation injury suggests that all levels of damage are associated with acute eyrhthma   She has had no lightheadedness or syncope.    She now comes in with a couple days' complaint of shortness of breath with exertion without orthopnea. She also has pain behind her left leg is noted swelling left greater than right   Past Medical History  Diagnosis Date  . Hyperlipidemia     takes Lovastatin daily  . Cardiomyopathy     tachycardia induced. EF 35-40% 06/2008 "patient denies"  . 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 Complete AV block     s/p pacemaker insertion 1996  . Peripheral artery disease     stent on lt illiac  . Peripheral vascular disease   . Ovarian cancer     ovarian, skin   . GERD (gastroesophageal reflux disease)     takes Zantac daily as needed  . Hypothyroid     takes Synthroid daily  . Anxiety     takes Klonopin daily as needed  . Hypertension     hx of-since hip replacement MD took resident off meds 10/30/14 b/c well controlled  . History of blood clots     in heart-was on  Coumadin---this many yrs ago  . History of bronchitis 33yrs ago  . Headache     occasionally  . Dizziness     occasionally  . Arthritis   . Joint pain   . Joint swelling   . Numbness and tingling     left leg and right arm  . Back pain     reason unknown  . History of colon polyps     benign  . Urinary frequency   . Nocturia     Past Surgical History  Procedure Laterality Date  . Pacemaker insertion  2012    Had replaced in July 2012, first placed in 1996  . Knee surgery Right   . Total abdominal hysterectomy w/ bilateral salpingoophorectomy  07/2008    Ovarian cancer  . Insert / replace / remove pacemaker    . Iliac artery stent  Apr 05, 2010    Left artery stenting  . Angioplasty / stenting iliac    . Abdominal hysterectomy    . Hernia mesh removal Right     incisional  . Total hip arthroplasty Right 10/31/2014    Procedure: TOTAL HIP ARTHROPLASTY ANTERIOR APPROACH;  Surgeon: Hessie Knows, MD;  Location: [facility] ORS;  Service: Orthopedics;  Laterality: Right;  . Cervical cerclage  21yrs ago  . Dilation and curettage of uterus    . Colonoscopy    . Dg barium swallow ([facility] hx)    .  Pacemaker lead removal Right 12/06/2014    Procedure: REMOVAL OF RV AND RA PACEMAKER LEADS;  Surgeon: Evans Lance, MD;  Location: [facility];  Service: Cardiovascular;  Laterality: Right;  DR. BARTLE TO BACK UP   . Ep implantable device Left 12/06/2014    Procedure: Implantation of cardiac resynchronization therapy pacemaker;  Surgeon: Evans Lance, MD;  Location: [facility] OR;  Service: Cardiovascular;  Laterality: Left;    Current Outpatient Prescriptions  Medication Sig Dispense Refill  . aspirin 81 MG tablet Take 81 mg by mouth daily.      . benazepril (LOTENSIN) 10 MG tablet Take 1 tablet (10 mg total) by mouth daily. 30 tablet 3  . clonazePAM (KLONOPIN) 0.5 MG tablet Take 1 tablet (0.5 mg total) by mouth daily as needed for anxiety (sleep). 30 tablet 0  . levothyroxine (SYNTHROID, LEVOTHROID) 100  MCG tablet Take 1 tablet (100 mcg total) by mouth daily. 30 tablet 1  . lovastatin (MEVACOR) 20 MG tablet Take 1 tablet (20 mg total) by mouth at bedtime. 30 tablet 3  . meloxicam (MOBIC) 15 MG tablet Take 15 mg by mouth daily.    Marland Kitchen oxyCODONE (OXY IR/ROXICODONE) 5 MG immediate release tablet Take 1-2 tablets (5-10 mg total) by mouth every 3 (three) hours as needed for breakthrough pain. 60 tablet 0  . ranitidine (ZANTAC) 150 MG capsule Take 150 mg by mouth 2 (two) times daily as needed for heartburn.     No current facility-administered medications for this visit.    Allergies  Allergen Reactions  . Marshmallow [Althaea Officinalis]     Rash, hives, itching, SOB  . Meat Extract     Rash, hives itching, SOB.  Can tolerate some meats ONLY in moderation  . Other     Jello - Rash, hives, itching, SOB  . Contrast Media [Iodinated Diagnostic Agents]     unknown  . Latex     rash  . Nickel     rash    Review of Systems negative except from HPI and PMH  Physical Exam BP 110/72 mmHg  Pulse 67  Ht 5\' 5"  (1.651 m)  Wt 183 lb (83.008 kg)  BMI 30.45 kg/m2 Well developed and well nourished in no acute distress HENT normal E scleral and icterus clear Neck Supple JVP 8-10 ; carotids brisk and full Clear to ausculation  Regular rate and rhythm, no murmurs gallops or rub Soft with active bowel sounds No clubbing cyanosis  Edema Alert and oriented, grossly normal motor and sensory function Skin Warm and Dry  ECG demonstrates P. Synchronous pacing  Assessment and  Plan  Complete heart block-congenital  Pacemaker-[employer]  NonsichemicCardiomyopathy  Peripheral vascular disease status post iliac stenting  Pacemaker lead revision     With her cardiomyopathy, we will resume her Lotensin at 5 mg daily. If she is able to tolerate it from a blood pressure point of view, we will add back a beta blocker.   With her shortness of breath and leg pain, we need to exclude DVT and  pulmonary embolism. We will arrange for venous Doppler and a CT scan. If these are unrevealing, we will begin a diuretic. We will also anticipate a CT scan revealing whether there is evidence of a pericardial effusion. Neck veins are modestly elevated making temp and not unlikely

## 2015-01-03 ENCOUNTER — Emergency Department: Payer: BLUE CROSS/BLUE SHIELD

## 2015-01-03 ENCOUNTER — Other Ambulatory Visit: Payer: Self-pay

## 2015-01-03 ENCOUNTER — Emergency Department
Admission: EM | Admit: 2015-01-03 | Discharge: 2015-01-03 | Disposition: A | Discharge status: Home / Self Care | Payer: BLUE CROSS/BLUE SHIELD | Attending: Emergency Medicine | Admitting: Emergency Medicine

## 2015-01-03 ENCOUNTER — Encounter: Payer: Self-pay | Admitting: Emergency Medicine

## 2015-01-03 ENCOUNTER — Telehealth: Payer: Self-pay

## 2015-01-03 DIAGNOSIS — Z87891 Personal history of nicotine dependence: Secondary | ICD-10-CM | POA: Insufficient documentation

## 2015-01-03 DIAGNOSIS — Z91040 Latex allergy status: Secondary | ICD-10-CM | POA: Insufficient documentation

## 2015-01-03 DIAGNOSIS — I313 Pericardial effusion (noninflammatory): Secondary | ICD-10-CM | POA: Diagnosis not present

## 2015-01-03 DIAGNOSIS — I10 Essential (primary) hypertension: Secondary | ICD-10-CM | POA: Insufficient documentation

## 2015-01-03 DIAGNOSIS — R079 Chest pain, unspecified: Secondary | ICD-10-CM | POA: Diagnosis not present

## 2015-01-03 DIAGNOSIS — Z950 Presence of cardiac pacemaker: Secondary | ICD-10-CM | POA: Insufficient documentation

## 2015-01-03 DIAGNOSIS — R0600 Dyspnea, unspecified: Secondary | ICD-10-CM

## 2015-01-03 DIAGNOSIS — Z79899 Other long term (current) drug therapy: Secondary | ICD-10-CM | POA: Diagnosis not present

## 2015-01-03 DIAGNOSIS — R0602 Shortness of breath: Secondary | ICD-10-CM | POA: Diagnosis present

## 2015-01-03 DIAGNOSIS — I3139 Other pericardial effusion (noninflammatory): Secondary | ICD-10-CM

## 2015-01-03 LAB — BASIC METABOLIC PANEL
Anion gap: 10 (ref 5–15)
BUN: 9 mg/dL (ref 6–20)
CO2: 25 mmol/L (ref 22–32)
Calcium: 9.6 mg/dL (ref 8.9–10.3)
Chloride: 104 mmol/L (ref 101–111)
Creatinine, Ser: 0.65 mg/dL (ref 0.44–1.00)
GFR calc Af Amer: >60 mL/min (ref >60)
GLUCOSE: 96 mg/dL (ref 65–99)
POTASSIUM: 4.3 mmol/L (ref 3.5–5.1)
Sodium: 139 mmol/L (ref 135–145)

## 2015-01-03 LAB — CBC WITH DIFFERENTIAL/PLATELET
Basophils Absolute: 0.1 10*3/uL (ref 0–0.1)
Basophils Relative: 1 %
EOS PCT: 3 %
Eosinophils Absolute: 0.2 10*3/uL (ref 0–0.7)
HCT: 40 % (ref 35.0–47.0)
Hemoglobin: 13.3 g/dL (ref 12.0–16.0)
LYMPHS ABS: 1 10*3/uL (ref 1.0–3.6)
LYMPHS PCT: 18 %
MCH: 29.7 pg (ref 26.0–34.0)
MCHC: 33.2 g/dL (ref 32.0–36.0)
MCV: 89.4 fL (ref 80.0–100.0)
MONO ABS: 0.5 10*3/uL (ref 0.2–0.9)
Monocytes Relative: 10 %
Neutro Abs: 3.8 10*3/uL (ref 1.4–6.5)
Neutrophils Relative %: 68 %
PLATELETS: 150 10*3/uL (ref 150–440)
RBC: 4.47 MIL/uL (ref 3.80–5.20)
RDW: 15.4 % — AB (ref 11.5–14.5)
WBC: 5.6 10*3/uL (ref 3.6–11.0)

## 2015-01-03 LAB — BRAIN NATRIURETIC PEPTIDE: B Natriuretic Peptide: 105 pg/mL — ABNORMAL HIGH (ref 0.0–100.0)

## 2015-01-03 LAB — TROPONIN I: Troponin I: <0.03 ng/mL (ref <0.031)

## 2015-01-03 MED ORDER — DIPHENHYDRAMINE HCL 50 MG/ML IJ SOLN
50.0000 mg | Freq: Once | INTRAMUSCULAR | Status: AC
  Administered 2015-01-03: 50 mg via INTRAVENOUS
  Filled 2015-01-03: qty 1

## 2015-01-03 MED ORDER — HYDROCORTISONE NA SUCCINATE PF 100 MG IJ SOLR
200.0000 mg | Freq: Once | INTRAMUSCULAR | Status: AC
  Administered 2015-01-03: 200 mg via INTRAVENOUS
  Filled 2015-01-03: qty 4

## 2015-01-03 MED ORDER — FUROSEMIDE 20 MG PO TABS: 20.0000 mg | ORAL_TABLET | Freq: Every day | ORAL | Status: DC

## 2015-01-03 MED ORDER — IOHEXOL 350 MG/ML SOLN
75.0000 mL | Freq: Once | INTRAVENOUS | Status: AC | PRN
  Administered 2015-01-03: 75 mL via INTRAVENOUS

## 2015-01-03 MED ORDER — IBUPROFEN 800 MG PO TABS: 800.0000 mg | ORAL_TABLET | Freq: Three times a day (TID) | ORAL | Status: DC | PRN

## 2015-01-03 NOTE — Discharge Instructions (Signed)
Pericardial Effusion °Pericardial effusion is an accumulation of extra fluid in the pericardial space. This is the space between the heart and the sac that surrounds it. This space normally contains a small amount of fluid which serves as lubrication for the heart inside the sac. If the amount of fluid increases, it causes problems for the working of the heart. This is called a heart (cardiac) tamponade.  °CAUSES  °A higher incidence of pericardial effusion is associated with certain diseases. Some common causes of pericardial effusion are: °· Infections (bacterial, viral, fungal, from AIDS, etc.). °· Cancer which has spread to the pericardial sac. °· Fluid accumulation in the sac after a heart attack or open heart surgery. °· Injury (a fall with chest injury or as a result of a knife or gunshot wound) with bleeding into the pericardial sac. °· Immune diseases (such as Rheumatoid Arthritis) and other arthritis conditions. °· Reactions (uncommon) to medications. °SYMPTOMS  °Very small effusions may cause no problems. Even a small effusion if it comes on rapidly can be deadly. °Some common symptoms are: °· Chest pain, pressure, discomfort. °· Light-headed feeling, fainting. °· Cough, shortness of breath. °· Feeling of palpitations. °· Hiccoughs °· Anxiety or confusion °DIAGNOSIS  °Your caregiver may do a number of blood tests and imaging tests (like X-rays) to help find the cause.  °· ECHO (Echocardiographic stress test) has become the diagnostic method of choice. This is due to its portability and availability. CT and MRI are also used, and may be more accurate. °· Pericardioscopy, where available, uses a telescope-like instrument to look inside the pericardial sac. °¨ This may be helpful in cases of unexplained pericardial effusions. It allows your caregiver to look at the pericardium and do pericardial biopsies if something abnormal is found. °· Sometimes fluid may be removed to help with diagnosing the cause of  the accumulation. This is called a pericardiocentesis. °TREATMENT  °Treatment is directed at removal of the extra pericardial fluid and treating the cause. Small amounts of effusion that are not causing problems can simply be watched. If the fluid is accumulating rapidly and resulting in cardiac tamponade, this can be life-threatening. Emergency removal of fluid must be done.  °SEEK IMMEDIATE MEDICAL CARE IF:  °· You develop chest pain, irregular heart beat (palpitations) or racing heart, shortness of breath, or begin sweating. °· You become lightheaded or pass out. °· You develop increasing swelling of the legs. °Document Released: 11/26/2004 Document Revised: 06/23/2011 Document Reviewed: 11/12/2007 °ExitCare® Patient Information ©2015 [employer]. This information is not intended to replace advice given to you by your health care provider. Make sure you discuss any questions you have with your health care provider. ° °

## 2015-01-03 NOTE — ED Notes (Signed)
Pt sent over from pcp to be further evaluated for PE. Pt with shortness of breath for three weeks.

## 2015-01-03 NOTE — Telephone Encounter (Signed)
S/w Dr. Caryl Comes regarding elevated D-dimer. Per verbal from MD, have pt go directly to ER for further testing. S/w Roswell Miners, charge nurse at [facility], to notify her of pt's need to be evaluated at ER for elevated d-dimer. [place] Miners verbalized understanding and states to have pt come now.  Called pt to inform her of lab result and Dr.Klein's instructions to go to ER. Provided pt w/charge nurse name and our number if any questions. Pt verbalized understanding and will go directly there.

## 2015-01-03 NOTE — Telephone Encounter (Signed)
Per verbal from Dr. Caryl Comes: Lasix 40mg  qd x 5 days then 20mg  qod x 10 days. F/u PA 2-3 weeks  S/w pt regarding Dr. Olin Pia recommendations. Pt agreeable to plan  Confirmed echo appt 9/22, 4pm and  f/u appt [facility] 10/19, 2pm. Pt states ER discharge paperwork recommends f/u Dr. Lovena Le in 2 days. Per Dr. Caryl Comes, no need. F/u w/[facility]. Confirmed pharmacy for lasix prescription. Prescription submitted.

## 2015-01-03 NOTE — ED Provider Notes (Signed)
[facility] Emergency Department Provider Note     Time seen: ----------------------------------------- 9:34 AM on 01/03/2015 -----------------------------------------    I have reviewed the triage vital signs and the nursing notes.   HISTORY  Chief Complaint Shortness of Breath    HPI [name] [name] is a 59 y.o. female who presents to ER being sent by her primary care doctor to be evaluated for pulmonary embolus. According to reports she had outpatient blood yesterday revealed an elevated d-dimer. She had normal lower extremity Doppler ultrasounds. She described chest pain and shortness of breath gets worse with activity or exertion. Pain is between both breasts, nothing seems to relieve her symptoms. She does have an IV contrast dye allergy of some sort.   Past Medical History  Diagnosis Date  . Hyperlipidemia     takes Lovastatin daily  . Cardiomyopathy     tachycardia induced. EF 35-40% 06/2008 "patient denies"  . 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 Complete AV block     s/p pacemaker insertion 1996  . Peripheral artery disease     stent on lt illiac  . Peripheral vascular disease   . Ovarian cancer     ovarian, skin   . GERD (gastroesophageal reflux disease)     takes Zantac daily as needed  . Hypothyroid     takes Synthroid daily  . Anxiety     takes Klonopin daily as needed  . Hypertension     hx of-since hip replacement MD took resident off meds 10/30/14 b/c well controlled  . History of blood clots     in heart-was on Coumadin---this many yrs ago  . History of bronchitis 85yrs ago  . Headache     occasionally  . Dizziness     occasionally  . Arthritis   . Joint pain   . Joint swelling   . Numbness and tingling     left leg and right arm  . Back pain     reason unknown  . History of colon polyps     benign  . Urinary frequency   . Nocturia      Patient Active Problem List   Diagnosis Date Noted  . Fractured atrial pacemaker lead wire 12/06/2014  . Primary osteoarthritis of right hip 10/31/2014  . LV dysfunction 10/27/2014  . 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 Peripheral artery disease   . Abdominal pain   . ATHEROSCLEROSIS W /INT CLAUDICATION 03/18/2010  . PACEMAKER, PERMANENT 11/29/2009  . NEOPLASM, MALIGNANT, OVARY 10/30/2008  . HYPERLIPIDEMIA-MIXED 06/23/2008  . 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 AV BLOCK, COMPLETE 06/23/2008    Past Surgical History  Procedure Laterality Date  . Pacemaker insertion  2012    Had replaced in July 2012, first placed in 1996  . Knee surgery Right   . Total abdominal hysterectomy w/ bilateral salpingoophorectomy  07/2008    Ovarian cancer  . Insert / replace / remove pacemaker    . Iliac artery stent  Apr 05, 2010    Left artery stenting  . Angioplasty / stenting iliac    . Abdominal hysterectomy    . Hernia mesh removal Right     incisional  . Total hip arthroplasty Right 10/31/2014    Procedure: TOTAL HIP ARTHROPLASTY ANTERIOR APPROACH;  Surgeon: Hessie Knows, MD;  Location: [facility];  Service: Orthopedics;  Laterality: Right;  .  Cervical cerclage  54yrs ago  . Dilation and curettage of uterus    . Colonoscopy    . Dg barium swallow ([facility] hx)    . Pacemaker lead removal Right 12/06/2014    Procedure: REMOVAL OF RV AND RA PACEMAKER LEADS;  Surgeon: Evans Lance, MD;  Location: [place];  Service: Cardiovascular;  Laterality: Right;  DR. BARTLE TO BACK UP   . Ep implantable device Left 12/06/2014    Procedure: Implantation of cardiac resynchronization therapy pacemaker;  Surgeon: Evans Lance, MD;  Location: [place];  Service: Cardiovascular;  Laterality: Left;    Allergies Marshmallow; Meat extract; Other; Contrast media; Latex; and Nickel  Social History Social History  Substance Use  Topics  . Smoking status: Former Research scientist (life sciences)  . Smokeless tobacco: None     Comment: quit smoking 67yrs ago  . Alcohol Use: 0.0 oz/week     Comment: beer couple a day    Review of Systems Constitutional: Negative for fever. Eyes: Negative for visual changes. ENT: Negative for sore throat. Cardiovascular: Positive for chest pain Respiratory: Positive for shortness of breath Gastrointestinal: Negative for abdominal pain, vomiting and diarrhea. Genitourinary: Negative for dysuria. Musculoskeletal: Negative for back pain. Skin: Negative for rash. Neurological: Negative for headaches, focal weakness or numbness.  10-point ROS otherwise negative.  ____________________________________________   PHYSICAL EXAM:  VITAL SIGNS: ED Triage Vitals  Enc Vitals Group     BP 01/03/15 0853 140/74 mmHg     Pulse Rate 01/03/15 0853 72     Resp 01/03/15 0853 20     Temp 01/03/15 0853 97.7 F (36.5 C)     Temp Source 01/03/15 0853 Oral     SpO2 01/03/15 0853 99 %     Weight 01/03/15 0853 183 lb (83.008 kg)     Height 01/03/15 0853 5\' 5"  (1.651 m)     Head Cir --      Peak Flow --      Pain Score --      Pain Loc --      Pain Edu? --      Excl. in [place]? --     Constitutional: Alert and oriented. Well appearing and in no distress. Eyes: Conjunctivae are normal. PERRL. Normal extraocular movements. ENT   Head: Normocephalic and atraumatic.   Nose: No congestion/rhinnorhea.   Mouth/Throat: Mucous membranes are moist.   Neck: No stridor. Cardiovascular: Normal rate, regular rhythm. Normal and symmetric distal pulses are present in all extremities. No murmurs, rubs, or gallops. Respiratory: Normal respiratory effort without tachypnea nor retractions. Breath sounds are clear and equal bilaterally. No wheezes/rales/rhonchi. Gastrointestinal: Soft and nontender. No distention. No abdominal bruits.  Musculoskeletal: Nontender with normal range of motion in all extremities. No joint  effusions.  No lower extremity tenderness nor edema. Neurologic:  Normal speech and language. No gross focal neurologic deficits are appreciated. Speech is normal. No gait instability. Skin:  Skin is warm, dry and intact. No rash noted. Psychiatric: Mood and affect are normal. Speech and behavior are normal. Patient exhibits appropriate insight and judgment. ____________________________________________  EKG: Interpreted by me.atrial sensed ventricular paced rhythm with a rate of 65 bpm  ____________________________________________  ED COURSE:  Pertinent labs & imaging results that were available during my care of the patient were reviewed by me and considered in my medical decision making (see chart for details). Patient will need likely CT imaging of her chest to rule out PE. We'll perform an emergency CT contrast prep with IV  Benadryl and Solu-Cortef. ____________________________________________    LABS (pertinent positives/negatives)  Labs Reviewed  CBC WITH DIFFERENTIAL/PLATELET - Abnormal; Notable for the following:    RDW 15.4 (*)    All other components within normal limits  BRAIN NATRIURETIC PEPTIDE - Abnormal; Notable for the following:    B Natriuretic Peptide 105.0 (*)    All other components within normal limits  BASIC METABOLIC PANEL  TROPONIN I    RADIOLOGY Images were viewed by me  Chest x-ray, chest CT IMPRESSION: No evidence of pulmonary embolism.  Small pericardial effusion.  Minimal left basilar atelectasis. ____________________________________________  FINAL ASSESSMENT AND PLAN  Chest pain, dyspnea, small pericardial effusion   Plan:symptoms likely secondary to small pericardial fluid. This is likely postprocedural. At this point her labs and exam is otherwise unremarkable. She stable for outpatient follow-up with her doctor.  Earleen Newport, MD 01/03/15 [phone]

## 2015-01-04 ENCOUNTER — Encounter: Payer: Self-pay | Admitting: Internal Medicine

## 2015-01-04 ENCOUNTER — Other Ambulatory Visit: Payer: Self-pay

## 2015-01-04 ENCOUNTER — Ambulatory Visit (INDEPENDENT_AMBULATORY_CARE_PROVIDER_SITE_OTHER): Payer: BLUE CROSS/BLUE SHIELD

## 2015-01-04 DIAGNOSIS — R0602 Shortness of breath: Secondary | ICD-10-CM | POA: Diagnosis not present

## 2015-01-05 ENCOUNTER — Encounter: Payer: Self-pay | Admitting: Cardiology

## 2015-01-15 ENCOUNTER — Other Ambulatory Visit: Payer: Self-pay

## 2015-01-15 MED ORDER — LEVOTHYROXINE SODIUM 100 MCG PO TABS: 100.0000 ug | ORAL_TABLET | Freq: Every day | ORAL | Status: DC

## 2015-01-15 NOTE — Telephone Encounter (Signed)
Patient has appointment scheduled for 01/17/15 and [facility] is [facility].

## 2015-01-17 ENCOUNTER — Encounter: Payer: Self-pay | Admitting: Unknown Physician Specialty

## 2015-01-17 ENCOUNTER — Ambulatory Visit (INDEPENDENT_AMBULATORY_CARE_PROVIDER_SITE_OTHER): Payer: BLUE CROSS/BLUE SHIELD | Admitting: Unknown Physician Specialty

## 2015-01-17 VITALS — BP 109/74 | HR 77 | Temp 97.7°F | Ht 65.0 in | Wt 178.8 lb

## 2015-01-17 DIAGNOSIS — G4700 Insomnia, unspecified: Secondary | ICD-10-CM

## 2015-01-17 DIAGNOSIS — I10 Essential (primary) hypertension: Secondary | ICD-10-CM | POA: Insufficient documentation

## 2015-01-17 DIAGNOSIS — E7800 Pure hypercholesterolemia, unspecified: Secondary | ICD-10-CM | POA: Insufficient documentation

## 2015-01-17 DIAGNOSIS — E785 Hyperlipidemia, unspecified: Secondary | ICD-10-CM | POA: Diagnosis not present

## 2015-01-17 DIAGNOSIS — Z0000 Encounter for general adult medical examination without abnormal findings: Secondary | ICD-10-CM | POA: Diagnosis not present

## 2015-01-17 LAB — MICROALBUMIN, URINE WAIVED
CREATININE, URINE WAIVED: 10 mg/dL (ref 10–300)
Microalb, Ur Waived: 10 mg/L (ref 0–19)

## 2015-01-17 MED ORDER — CLONAZEPAM 0.5 MG PO TABS: 0.5000 mg | ORAL_TABLET | Freq: Every day | ORAL | Status: DC | PRN

## 2015-01-17 NOTE — Progress Notes (Signed)
BP 109/74 mmHg  Pulse 77  Temp(Src) 97.7 F (36.5 C)  Ht 5\' 5"  (1.651 m)  Wt 178 lb 12.8 oz (81.103 kg)  BMI 29.75 kg/m2  SpO2 99%  LMP  (LMP Unknown)   Subjective:    Patient ID: [name] [name], female    DOB: 10-17-1955, 59 y.o.   MRN: [id number]  HPI: [name] is a 59 y.o. female  Chief Complaint  Patient presents with  . Annual Exam   Had 3 surgeries since last seen.  Hip replacement, hernia, and pacemaker.    Relevant past medical, surgical, family and social history reviewed and updated as indicated. Interim medical history since our last visit reviewed. Allergies and medications reviewed and updated.  Insomnia Pt states she has trouble staying asleep and uses Clonazepam to help her sleep 50-60% of the time.  Would like a refill of that medication.    Hypertension This is a chronic problem. The problem is controlled. Pertinent negatives include no chest pain or shortness of breath. Past treatments include ACE inhibitors. There are no compliance problems.  There is no history of chronic renal disease.  Hyperlipidemia This is a chronic problem. The problem is controlled. She has no history of chronic renal disease, diabetes, hypothyroidism, liver disease, obesity or nephrotic syndrome. Pertinent negatives include no chest pain, focal sensory loss, focal weakness, leg pain, myalgias or shortness of breath. Current antihyperlipidemic treatment includes statins. There are no compliance problems.      Review of Systems  Respiratory: Negative for shortness of breath.   Cardiovascular: Negative for chest pain.  Musculoskeletal: Negative for myalgias.  Neurological: Negative for focal weakness.    Per HPI unless specifically indicated above     Objective:    BP 109/74 mmHg  Pulse 77  Temp(Src) 97.7 F (36.5 C)  Ht 5\' 5"  (1.651 m)  Wt 178 lb 12.8 oz (81.103 kg)  BMI 29.75 kg/m2  SpO2 99%  LMP  (LMP Unknown)  Wt Readings from Last 3 Encounters:  01/17/15 178  lb 12.8 oz (81.103 kg)  01/03/15 183 lb (83.008 kg)  01/02/15 183 lb (83.008 kg)    Physical Exam  Constitutional: She is oriented to person, place, and time. She appears well-developed and well-nourished.  HENT:  Head: Normocephalic and atraumatic.  Eyes: Pupils are equal, round, and reactive to light. Right eye exhibits no discharge. Left eye exhibits no discharge. No scleral icterus.  Neck: Normal range of motion. Neck supple. Carotid bruit is not present. No thyromegaly present.  Cardiovascular: Normal rate, regular rhythm and normal heart sounds.  Exam reveals no gallop and no friction rub.   No murmur heard. Pulmonary/Chest: Effort normal and breath sounds normal. No respiratory distress. She has no wheezes. She has no rales.  Abdominal: Soft. Bowel sounds are normal. There is no tenderness. There is no rebound.  Genitourinary: No breast swelling, tenderness or discharge.  Musculoskeletal: Normal range of motion.  Lymphadenopathy:    She has no cervical adenopathy.  Neurological: She is alert and oriented to person, place, and time.  Skin: Skin is warm, dry and intact. No rash noted.  Psychiatric: She has a normal mood and affect. Her speech is normal and behavior is normal. Judgment and thought content normal. Cognition and memory are normal.    Results for orders placed or performed during the hospital encounter of 01/03/15  CBC with Differential  Result Value Ref Range   WBC 5.6 3.6 - 11.0 K/uL   RBC  4.47 3.80 - 5.20 MIL/uL   Hemoglobin 13.3 12.0 - 16.0 g/dL   HCT 40.0 35.0 - 47.0 %   MCV 89.4 80.0 - 100.0 fL   MCH 29.7 26.0 - 34.0 pg   MCHC 33.2 32.0 - 36.0 g/dL   RDW 15.4 (H) 11.5 - 14.5 %   Platelets 150 150 - 440 K/uL   Neutrophils Relative % 68 %   Neutro Abs 3.8 1.4 - 6.5 K/uL   Lymphocytes Relative 18 %   Lymphs Abs 1.0 1.0 - 3.6 K/uL   Monocytes Relative 10 %   Monocytes Absolute 0.5 0.2 - 0.9 K/uL   Eosinophils Relative 3 %   Eosinophils Absolute 0.2 0 -  0.7 K/uL   Basophils Relative 1 %   Basophils Absolute 0.1 0 - 0.1 K/uL  Basic metabolic panel  Result Value Ref Range   Sodium 139 135 - 145 mmol/L   Potassium 4.3 3.5 - 5.1 mmol/L   Chloride 104 101 - 111 mmol/L   CO2 25 22 - 32 mmol/L   Glucose, Bld 96 65 - 99 mg/dL   BUN 9 6 - 20 mg/dL   Creatinine, Ser 0.65 0.44 - 1.00 mg/dL   Calcium 9.6 8.9 - 10.3 mg/dL   GFR calc non Af Amer >60 >60 mL/min   GFR calc Af Amer >60 >60 mL/min   Anion gap 10 5 - 15  Brain natriuretic peptide  Result Value Ref Range   B Natriuretic Peptide 105.0 (H) 0.0 - 100.0 pg/mL  Troponin I  Result Value Ref Range   Troponin I <0.03 <0.031 ng/mL      Assessment & Plan:   Problem List Items Addressed This Visit      Unprioritized   Insomnia    Pt Ed.  OK with Clonazepam several nights/week.       Essential hypertension    Stable, continue present medications. Stable, continue present medications.       Relevant Orders   Uric acid   Microalbumin, Urine Waived   Hyperlipidemia - Primary    Stable, continue present medications.       Relevant Orders   Lipid Panel w/o Chol/HDL Ratio    Other Visit Diagnoses    Routine general medical examination at a health care facility        Relevant Orders    Lipid Panel w/o Chol/HDL Ratio    TSH    Uric acid    Microalbumin, Urine Waived    Hepatitis C antibody    HIV antibody        Follow up plan: Return in about 6 months (around 07/18/2015).

## 2015-01-17 NOTE — Patient Instructions (Signed)
Insomnia Insomnia is a sleep disorder that makes it difficult to fall asleep or to stay asleep. Insomnia can cause tiredness (fatigue), low energy, difficulty concentrating, mood swings, and poor performance at work or school.  There are three different ways to classify insomnia:  Difficulty falling asleep.  Difficulty staying asleep.  Waking up too early in the morning. Any type of insomnia can be long-term (chronic) or short-term (acute). Both are common. Short-term insomnia usually lasts for three months or less. Chronic insomnia occurs at least three times a week for longer than three months. CAUSES  Insomnia may be caused by another condition, situation, or substance, such as:  Anxiety.  Certain medicines.  Gastroesophageal reflux disease (GERD) or other gastrointestinal conditions.  Asthma or other breathing conditions.  Restless legs syndrome, sleep apnea, or other sleep disorders.  Chronic pain.  Menopause. This may include hot flashes.  Stroke.  Abuse of alcohol, tobacco, or illegal drugs.  Depression.  Caffeine.   Neurological disorders, such as Alzheimer disease.  An overactive thyroid (hyperthyroidism). The cause of insomnia may not be known. RISK FACTORS Risk factors for insomnia include:  Gender. Women are more commonly affected than men.  Age. Insomnia is more common as you get older.  Stress. This may involve your professional or personal life.  Income. Insomnia is more common in people with lower income.  Lack of exercise.   Irregular work schedule or night shifts.  Traveling between different time zones. SIGNS AND SYMPTOMS If you have insomnia, trouble falling asleep or trouble staying asleep is the main symptom. This may lead to other symptoms, such as:  Feeling fatigued.  Feeling nervous about going to sleep.  Not feeling rested in the morning.  Having trouble concentrating.  Feeling irritable, anxious, or depressed. TREATMENT   Treatment for insomnia depends on the cause. If your insomnia is caused by an underlying condition, treatment will focus on addressing the condition. Treatment may also include:   Medicines to help you sleep.  Counseling or therapy.  Lifestyle adjustments. HOME CARE INSTRUCTIONS   Take medicines only as directed by your health care provider.  Keep regular sleeping and waking hours. Avoid naps.  Keep a sleep diary to help you and your health care provider figure out what could be causing your insomnia. Include:   When you sleep.  When you wake up during the night.  How well you sleep.   How rested you feel the next day.  Any side effects of medicines you are taking.  What you eat and drink.   Make your bedroom a comfortable place where it is easy to fall asleep:  Put up shades or special blackout curtains to block light from outside.  Use a white noise machine to block noise.  Keep the temperature cool.   Exercise regularly as directed by your health care provider. Avoid exercising right before bedtime.  Use relaxation techniques to manage stress. Ask your health care provider to suggest some techniques that may work well for you. These may include:  Breathing exercises.  Routines to release muscle tension.  Visualizing peaceful scenes.  Cut back on alcohol, caffeinated beverages, and cigarettes, especially close to bedtime. These can disrupt your sleep.  Do not overeat or eat spicy foods right before bedtime. This can lead to digestive discomfort that can make it hard for you to sleep.  Limit screen use before bedtime. This includes:  Watching TV.  Using your smartphone, tablet, and computer.  Stick to a routine. This   can help you fall asleep faster. Try to do a quiet activity, brush your teeth, and go to bed at the same time each night.  Get out of bed if you are still awake after 15 minutes of trying to sleep. Keep the lights down, but try reading or  doing a quiet activity. When you feel sleepy, go back to bed.  Make sure that you drive carefully. Avoid driving if you feel very sleepy.  Keep all follow-up appointments as directed by your health care provider. This is important. SEEK MEDICAL CARE IF:   You are tired throughout the day or have trouble in your daily routine due to sleepiness.  You continue to have sleep problems or your sleep problems get worse. SEEK IMMEDIATE MEDICAL CARE IF:   You have serious thoughts about hurting yourself or someone else.   This information is not intended to replace advice given to you by your health care provider. Make sure you discuss any questions you have with your health care provider.   Document Released: 03/28/2000 Document Revised: 12/20/2014 Document Reviewed: 12/30/2013 Elsevier Interactive Patient Education 2016 [employer]  

## 2015-01-17 NOTE — Assessment & Plan Note (Signed)
Stable, continue present medications. Stable, continue present medications.  

## 2015-01-17 NOTE — Assessment & Plan Note (Signed)
Stable, continue present medications.   

## 2015-01-17 NOTE — Assessment & Plan Note (Signed)
Pt Ed.  OK with Clonazepam several nights/week.

## 2015-01-18 LAB — HIV ANTIBODY (ROUTINE TESTING W REFLEX): HIV SCREEN 4TH GENERATION: NONREACTIVE

## 2015-01-18 LAB — LIPID PANEL W/O CHOL/HDL RATIO
Cholesterol, Total: 173 mg/dL (ref 100–199)
HDL: 64 mg/dL (ref >39)
LDL CALC: 94 mg/dL (ref 0–99)
Triglycerides: 75 mg/dL (ref 0–149)
VLDL CHOLESTEROL CAL: 15 mg/dL (ref 5–40)

## 2015-01-18 LAB — URIC ACID: Uric Acid: 7.5 mg/dL — ABNORMAL HIGH (ref 2.5–7.1)

## 2015-01-18 LAB — TSH: TSH: 1.64 u[IU]/mL (ref 0.450–4.500)

## 2015-01-18 LAB — HEPATITIS C ANTIBODY: Hep C Virus Ab: <0.1 s/co ratio (ref 0.0–0.9)

## 2015-01-18 NOTE — Progress Notes (Signed)
Quick Note:  Notified pt by mychart ______

## 2015-01-29 ENCOUNTER — Other Ambulatory Visit: Payer: Self-pay

## 2015-01-29 MED ORDER — METOPROLOL TARTRATE 50 MG PO TABS: 50.0000 mg | ORAL_TABLET | Freq: Three times a day (TID) | ORAL | Status: DC

## 2015-01-29 NOTE — Telephone Encounter (Signed)
PATIENT: [name] [name] DOB: 04/19/1955 Pharmacy[facility] [address] [place]. Last Visit: 01/17/2015  Patient requests metoprolol tartrate 50 mg tab.

## 2015-01-31 ENCOUNTER — Encounter: Payer: Self-pay | Admitting: Physician Assistant

## 2015-01-31 ENCOUNTER — Ambulatory Visit (INDEPENDENT_AMBULATORY_CARE_PROVIDER_SITE_OTHER): Payer: BLUE CROSS/BLUE SHIELD | Admitting: Physician Assistant

## 2015-01-31 VITALS — BP 92/62 | HR 83 | Ht 65.0 in | Wt 179.2 lb

## 2015-01-31 DIAGNOSIS — I739 Peripheral vascular disease, unspecified: Secondary | ICD-10-CM

## 2015-01-31 DIAGNOSIS — R079 Chest pain, unspecified: Secondary | ICD-10-CM

## 2015-01-31 DIAGNOSIS — I429 Cardiomyopathy, unspecified: Secondary | ICD-10-CM

## 2015-01-31 DIAGNOSIS — I10 Essential (primary) hypertension: Secondary | ICD-10-CM | POA: Diagnosis not present

## 2015-01-31 DIAGNOSIS — Z01812 Encounter for preprocedural laboratory examination: Secondary | ICD-10-CM

## 2015-01-31 DIAGNOSIS — R0602 Shortness of breath: Secondary | ICD-10-CM

## 2015-01-31 DIAGNOSIS — I5022 Chronic systolic (congestive) heart failure: Secondary | ICD-10-CM

## 2015-01-31 DIAGNOSIS — Z8679 Personal history of other diseases of the circulatory system: Secondary | ICD-10-CM

## 2015-01-31 DIAGNOSIS — E785 Hyperlipidemia, unspecified: Secondary | ICD-10-CM

## 2015-01-31 DIAGNOSIS — I428 Other cardiomyopathies: Secondary | ICD-10-CM | POA: Insufficient documentation

## 2015-01-31 DIAGNOSIS — I442 Atrioventricular block, complete: Secondary | ICD-10-CM | POA: Insufficient documentation

## 2015-01-31 MED ORDER — FUROSEMIDE 20 MG PO TABS: 20.0000 mg | ORAL_TABLET | Freq: Every day | ORAL | Status: DC

## 2015-01-31 MED ORDER — BENAZEPRIL HCL 5 MG PO TABS: 5.0000 mg | ORAL_TABLET | Freq: Every day | ORAL | Status: DC

## 2015-01-31 NOTE — Progress Notes (Signed)
Cardiology Office Note:  Date of Encounter: 01/31/2015  ID: [name] [name], DOB 18-Mar-1956, MRN [id number]  PCP:  [name], NP Primary Cardiologist:  Dr. Caryl Comes, MD  Chief Complaint  Patient presents with  . other    C/o chest pain, low BP and feeling sob. Pt refused EKG due to having had several this year/cost. Meds reviewed verbally with pt.    HPI:  59 y.o. 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 She was advised to follow up with the ED, and did so on 9/21 and had a negative CTA chest PE protocol for PE, though did have a small pericardial effusion. Troponin was negative. BNP 105. She subsequently underwent follow up echo on 9/22 that showed EF 35-40%, no RWMA, GR1DD, left atrium mildly dilated, moderate TR, PASP normal, small pericardial effusion that was posterior to the heart and circumferential to the heart. There was no evidence of hemodynamic compromise. She was started on Lasix 40 mg for 5 days, then 20 mg every other day for 10 days. She was advised to follow up in two months, but already had this appointment scheduled and decided to keep it. She presents for evaluation of ongoing exertional chest pain and SOB.   She does report feeling somewhat better since starting Lasix as above, though she continues to have exertional chest pain and SOB, especially when going up-hill. Chest pain is along the central chest and does  not radiate. Outside of her SOB there are no associated symptoms. She denies any increased weight gain, LEE, orthopnea, PND, early satiety, presyncope, syncope, diaphoresis, nausea, or vomiting. She last had the pain earlier today. She is currently chest pain free.      Past Medical History  Diagnosis Date  . Hyperlipidemia     takes Lovastatin daily  . NICM (nonischemic cardiomyopathy) ([place])     a. tachycardia induced. EF 35-40% 06/2008;   . 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 Complete AV block ([place])     a. s/p pacemaker insertion 1996; b. s/p contralateral side St. Jude generator change 11/2014  . Peripheral artery disease ([place])     stent on lt illiac  . Peripheral vascular disease ([place])   . Ovarian cancer ([facility])     ovarian, skin   . GERD (gastroesophageal reflux disease)     takes Zantac daily as needed  . Hypothyroid     takes Synthroid daily  . Anxiety     takes Klonopin daily as needed  . Hypertension     hx of-since hip replacement MD took resident off meds 10/30/14 b/c well controlled  . History of blood clots     in heart-was on Coumadin---this many yrs ago  . Headache     occasionally  . Dizziness     occasionally  . Arthritis   . Joint pain   . Joint swelling   . Numbness and  tingling     left leg and right arm  . Back pain     reason unknown  . History of colon polyps     benign  . Chronic systolic CHF (congestive heart failure) ([place])     a. echo 10/2013: EF 40-45 (apical images 35-40%), diff HK, mild MR, PASP nl; b. echo 12/2014: EF 35-40%, no RWMA, GR1DD, mod TR, PASP nl, small pericardial effusion circum to the heart, no hemo compromise  :  Past Surgical History  Procedure Laterality Date  . Pacemaker insertion  2012    Had replaced in July 2012, first placed in 1996  . Knee surgery Right   . Total abdominal hysterectomy w/ bilateral salpingoophorectomy  07/2008    Ovarian  cancer  . Insert / replace / remove pacemaker    . Iliac artery stent  Apr 05, 2010    Left artery stenting  . Angioplasty / stenting iliac    . Abdominal hysterectomy    . Hernia mesh removal Right     incisional  . Total hip arthroplasty Right 10/31/2014    Procedure: TOTAL HIP ARTHROPLASTY ANTERIOR APPROACH;  Surgeon: Hessie Knows, MD;  Location: [facility];  Service: Orthopedics;  Laterality: Right;  . Cervical cerclage  79yrs ago  . Dilation and curettage of uterus    . Colonoscopy    . Dg barium swallow ([facility] hx)    . Pacemaker lead removal Right 12/06/2014    Procedure: REMOVAL OF RV AND RA PACEMAKER LEADS;  Surgeon: Evans Lance, MD;  Location: [facility];  Service: Cardiovascular;  Laterality: Right;  DR. BARTLE TO BACK UP   . Ep implantable device Left 12/06/2014    Procedure: Implantation of cardiac resynchronization therapy pacemaker;  Surgeon: Evans Lance, MD;  Location: [facility];  Service: Cardiovascular;  Laterality: Left;  . Hernia repair    . Joint replacement  October 31, 2014    hip  . Pacemaker placement Left 12/06/14  :  Social History:  The patient  reports that she quit smoking about 6 years ago. She does not have any smokeless tobacco history on file. She reports that she drinks about 12.0 oz of alcohol per week. She reports that she does not use illicit drugs.   Family History  Problem Relation Age of Onset  . Cancer Father     died of lung cancer  . Lung cancer Father   . Diabetes Brother   . Hyperlipidemia Mother   . Polymyalgia rheumatica Mother   . Arrhythmia Son      Allergies:  Allergies  Allergen Reactions  . Marshmallow [Althaea Officinalis]     Rash, hives, itching, SOB  . Meat Extract     Rash, hives itching, SOB.  Can tolerate some meats ONLY in moderation  . Other     Jello - Rash, hives, itching, SOB  . Contrast Media [Iodinated Diagnostic Agents]     unknown  . Latex     rash  . Nickel     rash     Home Medications:  Current  Outpatient Prescriptions  Medication Sig Dispense Refill  . aspirin EC 81 MG tablet Take 81 mg by mouth daily.    . benazepril (LOTENSIN) 5 MG tablet Take 1 tablet (5 mg total) by mouth daily. 90 tablet 3  . clonazePAM (KLONOPIN) 0.5 MG tablet Take 1 tablet (0.5 mg total) by mouth daily as needed for anxiety (sleep). 30 tablet 2  . ibuprofen (ADVIL,MOTRIN) 800  MG tablet Take 1 tablet (800 mg total) by mouth every 8 (eight) hours as needed. 30 tablet 0  . levothyroxine (SYNTHROID, LEVOTHROID) 100 MCG tablet Take 1 tablet (100 mcg total) by mouth daily. 30 tablet 1  . lovastatin (MEVACOR) 20 MG tablet Take 1 tablet (20 mg total) by mouth at bedtime. 30 tablet 3  . meloxicam (MOBIC) 15 MG tablet Take 15 mg by mouth daily.    . ranitidine (ZANTAC) 150 MG capsule Take 150 mg by mouth 2 (two) times daily as needed for heartburn.    . furosemide (LASIX) 20 MG tablet Take 1 tablet (20 mg total) by mouth daily. 90 tablet 3   No current facility-administered medications for this visit.     Review of Systems:  Review of Systems  Constitutional: Positive for malaise/fatigue. Negative for fever, chills, weight loss and diaphoresis.  HENT: Negative for congestion.   Eyes: Negative for discharge and redness.  Respiratory: Positive for shortness of breath. Negative for cough, hemoptysis, sputum production and wheezing.   Cardiovascular: Positive for chest pain and leg swelling. Negative for palpitations, orthopnea, claudication and PND.       Trivial lower extremity swelling  Gastrointestinal: Negative for heartburn, nausea, vomiting and abdominal pain.  Musculoskeletal: Negative for falls.  Skin: Negative for rash.  Neurological: Positive for weakness. Negative for dizziness, tingling, tremors, sensory change, speech change, focal weakness and loss of consciousness.  Endo/Heme/Allergies: Does not bruise/bleed easily.  Psychiatric/Behavioral: Negative for substance abuse. The patient is not  nervous/anxious.      Physical Exam:  Blood pressure 92/62, pulse 83, height 5\' 5"  (1.651 m), weight 179 lb 4 oz (81.307 kg). BMI: Body mass index is 29.83 kg/(m^2). General: Pleasant, NAD. Psych: Normal affect. Responds to questions with normal affect.  Neuro: Alert and oriented X 3. Moves all extremities spontaneously. HEENT: Normocephalic, atraumatic. EOM intact. Sclera anicteric.  Neck: Trachea midline. Supple without bruits or JVD. Lungs:  Respirations regular and unlabored. CTA bilaterally without wheezing, crackles, or rhonchi.  Heart: RRR, normal s3, s4. No murmurs, rubs, or gallops.  Abdomen: Obese, soft, non-tender, non-distended, BS + x 4.  Extremities: No clubbing or cyanosis. Trivial bilateral edema. DP/PT/Radials 2+ and equal bilaterally.   Accessory Clinical Findings:  EKG: not done today  Recent Labs: 01/03/2015: B Natriuretic Peptide 105.0*; BUN 9; Creatinine, Ser 0.65; Hemoglobin 13.3; Platelets 150; Potassium 4.3; Sodium 139 01/17/2015: TSH 1.640  01/17/2015: Cholesterol, Total 173; HDL 64; LDL Calculated 94; Triglycerides 75  CrCl cannot be calculated (Patient has no serum creatinine result on file.).  Weights: Wt Readings from Last 3 Encounters:  01/31/15 179 lb 4 oz (81.307 kg)  01/17/15 178 lb 12.8 oz (81.103 kg)  01/03/15 183 lb (83.008 kg)    Other studies Reviewed: Additional studies/ records that were reviewed today include: prior office notes and imaging studies.  Assessment & Plan:  1. Unstable angina: -Given her ongoing exertional symptoms of chest pain and SOB will schedule her for cardiac catheterization -She will require pre for possible IV contrast allergy, though she is uncertain if this is a true allergy  -Continue aspirin 81 mg daily -Risks and benefits of cardiac catheterization have been discussed with the patient including risks of bleeding, bruising, infection, kidney damage, stroke, heart attack, and death. The patient understands  these risks and is willing to proceed with the procedure. All questions have been answered and concerns listened to.   2. Chronic systolic CHF: -Blood pressure precludes the restarting of beta blocker  at this time. When able would aim to start Coreg or Toprol XL -I decreased her benazepril to 5 mg daily at this time to allow for slightly more blood pressure room for a few more days of diuresis heading into her cardiac cath (Lasix and benazepril will be held pre-cath) -Blood pressure is too soft to change her from benazepril to Entresto at this time, though would continue to keep be mindful of this swap in the future if blood pressure permits  -She does not appear to be volume overloaded today  3. History of congenital complete heart block s/p St Jude pacemaker generator change 11/2014: -Stable -Followed by EP -Small post procedure pericardial effusion without hemodynamic compromise seen on both CT chest and echo in September -Lasix as above  4. HTN: -Soft blood pressures since her hip surgery -Stable  5. HLD: -Continue lovastatin for now -If intervention is required on cardiac cath would change to Lipitor    Dispo: -Follow up with Dr. Caryl Comes, MD post cardiac cath  Current medicines are reviewed at length with the patient today.  The patient did not have any concerns regarding medicines.   [name], PA-C [address] [place], [place] [address] [phone] [place] 01/31/2015, 4:30 PM

## 2015-01-31 NOTE — Patient Instructions (Addendum)
Medication Instructions:  Please start lasix 20 mg once daily  Please decrease your benazepril to 5 mg once daily Do not take the day before your cath (10/26 or 10/27)  Labwork: BMET CBC PT/INR  Testing/Procedures: [facility] Cardiac Cath Instructions   You are scheduled for a Cardiac Cath on:__Thursday, Oct 27_________  Please arrive at 6:30 am on the day of your procedure  You will need to pre-register prior to the day of your procedure.  Enter through the [employer] at [facility].  Registration is the first desk on your right.  Please take the procedure order we have given you in order to be registered appropriately  Do not eat/drink anything after midnight  Someone will need to drive you home  It is recommended someone be with you for the first 24 hours after your procedure  Wear clothes that are easy to get on/off and wear slip on shoes if possible   Medications bring a current list of all medications with you  _X_ Do not take these medications before your procedure:  - Lasix or Benazepril the day before or morning of your procedure  Day of your procedure: Arrive at the [place] entrance.  Free valet service is available.  After entering the [facility] please check-in at the registration desk (1st desk on your right) to receive your armband. After receiving your armband someone will escort you to the cardiac cath/special procedures waiting area.  The usual length of stay after your procedure is about 2 to 3 hours.  This can vary.  If you have any questions, please call our office at [phone], or you may call the cardiac cath lab at [facility] directly at [phone]  Follow-Up: After your cath  Any Other Special Instructions Will Be Listed Below     Angiogram An angiogram, also called angiography, is a procedure used to look at the blood vessels. In this procedure, dye is injected through a long, thin tube (catheter) into an artery. X-rays are then taken. The X-rays  will show if there is a blockage or problem in a blood vessel.  LET [facility] CARE PROVIDER KNOW ABOUT:  Any allergies you have, including allergies to shellfish or contrast dye.   All medicines you are taking, including vitamins, herbs, eye drops, creams, and over-the-counter medicines.   Previous problems you or members of your family have had with the use of anesthetics.   Any blood disorders you have.   Previous surgeries you have had.  Any previous kidney problems or failure you have had.  Medical conditions you have.   Possibility of pregnancy, if this applies. RISKS AND COMPLICATIONS Generally, an angiogram is a safe procedure. However, as with any procedure, problems can occur. Possible problems include:  Injury to the blood vessels, including rupture or bleeding.  Infection or bruising at the catheter site.  Allergic reaction to the dye or contrast used.  Kidney damage from the dye or contrast used.  Blood clots that can lead to a stroke or heart attack. BEFORE THE PROCEDURE  Do not eat or drink after midnight on the night before the procedure, or as directed by your health care provider.   Ask your health care provider if you may drink enough water to take any needed medicines the morning of the procedure.  PROCEDURE  You may be given a medicine to help you relax (sedative) before and during the procedure. This medicine is given through an IV access tube that is inserted into one  of your veins.   The area where the catheter will be inserted will be washed and shaved. This is usually done in the groin but may be done in the fold of your arm (near your elbow) or in the wrist.  A medicine will be given to numb the area where the catheter will be inserted (local anesthetic).  The catheter will be inserted with a guide wire into an artery. The catheter is guided by using a type of X-ray (fluoroscopy) to the blood vessel being examined.   Dye is then  injected into the catheter, and X-rays are taken. The dye helps to show where any narrowing or blockages are located.  AFTER THE PROCEDURE   If the procedure is done through the leg, you will be kept in bed lying flat for several hours. You will be instructed to not bend or cross your legs.  The insertion site will be checked frequently.  The pulse in your feet or wrist will be checked frequently.  Additional blood tests, X-rays, and electrocardiography may be done.   You may need to stay in the hospital overnight for observation.    This information is not intended to replace advice given to you by your health care provider. Make sure you discuss any questions you have with your health care provider.   Document Released: 01/08/2005 Document Revised: 04/21/2014 Document Reviewed: 09/01/2012 Elsevier Interactive Patient Education [employer].

## 2015-02-01 ENCOUNTER — Telehealth: Payer: Self-pay | Admitting: *Deleted

## 2015-02-01 NOTE — Telephone Encounter (Signed)
S/w pt who states she has mammogram appt 11/2 . Cath on 10/24 Pt inquiring if ok for mammogram in Nov. Per verbal from Alvis Lemmings, pt is ok to have mammogram in November. Pt verbalized understanding w/no further questions.

## 2015-02-01 NOTE — Telephone Encounter (Signed)
Pt calling stating she is to get a cath done and on Monday 02/05/15 she has a mammogram apt, She wants to know if she is okay to do this after the procedure. Please advise .

## 2015-02-02 ENCOUNTER — Other Ambulatory Visit
Admission: RE | Admit: 2015-02-02 | Discharge: 2015-02-02 | Disposition: A | Discharge status: Home / Self Care | Payer: BLUE CROSS/BLUE SHIELD | Source: Ambulatory Visit | Attending: Physician Assistant | Admitting: Physician Assistant

## 2015-02-02 DIAGNOSIS — R079 Chest pain, unspecified: Secondary | ICD-10-CM | POA: Diagnosis present

## 2015-02-02 DIAGNOSIS — Z01812 Encounter for preprocedural laboratory examination: Secondary | ICD-10-CM | POA: Diagnosis not present

## 2015-02-02 DIAGNOSIS — R0602 Shortness of breath: Secondary | ICD-10-CM | POA: Diagnosis present

## 2015-02-02 LAB — BASIC METABOLIC PANEL
ANION GAP: 9 (ref 5–15)
BUN: 11 mg/dL (ref 6–20)
CALCIUM: 9.6 mg/dL (ref 8.9–10.3)
CHLORIDE: 98 mmol/L — AB (ref 101–111)
CO2: 27 mmol/L (ref 22–32)
Creatinine, Ser: 0.91 mg/dL (ref 0.44–1.00)
GFR calc non Af Amer: >60 mL/min (ref >60)
Glucose, Bld: 101 mg/dL — ABNORMAL HIGH (ref 65–99)
Potassium: 4.2 mmol/L (ref 3.5–5.1)
Sodium: 134 mmol/L — ABNORMAL LOW (ref 135–145)

## 2015-02-02 LAB — CBC WITH DIFFERENTIAL/PLATELET
BASOS ABS: 0.1 10*3/uL (ref 0–0.1)
BASOS PCT: 1 %
Eosinophils Absolute: 0.2 10*3/uL (ref 0–0.7)
Eosinophils Relative: 3 %
HEMATOCRIT: 42.2 % (ref 35.0–47.0)
HEMOGLOBIN: 14.1 g/dL (ref 12.0–16.0)
Lymphocytes Relative: 21 %
Lymphs Abs: 1.3 10*3/uL (ref 1.0–3.6)
MCH: 29.1 pg (ref 26.0–34.0)
MCHC: 33.5 g/dL (ref 32.0–36.0)
MCV: 87.1 fL (ref 80.0–100.0)
Monocytes Absolute: 0.6 10*3/uL (ref 0.2–0.9)
Monocytes Relative: 9 %
NEUTROS ABS: 4.2 10*3/uL (ref 1.4–6.5)
NEUTROS PCT: 66 %
Platelets: 201 10*3/uL (ref 150–440)
RBC: 4.85 MIL/uL (ref 3.80–5.20)
RDW: 14.5 % (ref 11.5–14.5)
WBC: 6.4 10*3/uL (ref 3.6–11.0)

## 2015-02-02 LAB — PROTIME-INR
INR: 0.97
PROTHROMBIN TIME: 13.1 s (ref 11.4–15.0)

## 2015-02-07 ENCOUNTER — Telehealth: Payer: Self-pay

## 2015-02-07 NOTE — Telephone Encounter (Signed)
Left detailed message w/CB number on pt home VM regarding cardiac cath instructions and arrival time.

## 2015-02-07 NOTE — Telephone Encounter (Signed)
S/w pt to review cath instructions. Pt verbalized understanding with no further questions.

## 2015-02-07 NOTE — Telephone Encounter (Signed)
Pt is returning your call, states she will not get her home VM until after call. Please call about procedure tomorrow.

## 2015-02-08 ENCOUNTER — Encounter: Payer: Self-pay | Admitting: *Deleted

## 2015-02-08 ENCOUNTER — Ambulatory Visit
Admission: RE | Admit: 2015-02-08 | Discharge: 2015-02-08 | Disposition: A | Discharge status: Home / Self Care | Payer: BLUE CROSS/BLUE SHIELD | Source: Ambulatory Visit | Attending: Cardiovascular Disease | Admitting: Cardiovascular Disease

## 2015-02-08 ENCOUNTER — Encounter
Admission: RE | Disposition: A | Discharge status: Home / Self Care | Payer: Self-pay | Source: Ambulatory Visit | Attending: Cardiovascular Disease

## 2015-02-08 DIAGNOSIS — Z95820 Peripheral vascular angioplasty status with implants and grafts: Secondary | ICD-10-CM | POA: Diagnosis not present

## 2015-02-08 DIAGNOSIS — I5022 Chronic systolic (congestive) heart failure: Secondary | ICD-10-CM | POA: Insufficient documentation

## 2015-02-08 DIAGNOSIS — I208 Other forms of angina pectoris: Secondary | ICD-10-CM | POA: Diagnosis not present

## 2015-02-08 DIAGNOSIS — I10 Essential (primary) hypertension: Secondary | ICD-10-CM | POA: Insufficient documentation

## 2015-02-08 DIAGNOSIS — Z950 Presence of cardiac pacemaker: Secondary | ICD-10-CM | POA: Insufficient documentation

## 2015-02-08 DIAGNOSIS — Z8249 Family history of ischemic heart disease and other diseases of the circulatory system: Secondary | ICD-10-CM | POA: Insufficient documentation

## 2015-02-08 DIAGNOSIS — R202 Paresthesia of skin: Secondary | ICD-10-CM | POA: Insufficient documentation

## 2015-02-08 DIAGNOSIS — I313 Pericardial effusion (noninflammatory): Secondary | ICD-10-CM | POA: Diagnosis not present

## 2015-02-08 DIAGNOSIS — F419 Anxiety disorder, unspecified: Secondary | ICD-10-CM | POA: Insufficient documentation

## 2015-02-08 DIAGNOSIS — Z96641 Presence of right artificial hip joint: Secondary | ICD-10-CM | POA: Diagnosis not present

## 2015-02-08 DIAGNOSIS — Z91041 Radiographic dye allergy status: Secondary | ICD-10-CM | POA: Insufficient documentation

## 2015-02-08 DIAGNOSIS — Z7982 Long term (current) use of aspirin: Secondary | ICD-10-CM | POA: Diagnosis not present

## 2015-02-08 DIAGNOSIS — M2540 Effusion, unspecified joint: Secondary | ICD-10-CM | POA: Diagnosis not present

## 2015-02-08 DIAGNOSIS — M549 Dorsalgia, unspecified: Secondary | ICD-10-CM | POA: Insufficient documentation

## 2015-02-08 DIAGNOSIS — K219 Gastro-esophageal reflux disease without esophagitis: Secondary | ICD-10-CM | POA: Insufficient documentation

## 2015-02-08 DIAGNOSIS — Z90710 Acquired absence of both cervix and uterus: Secondary | ICD-10-CM | POA: Diagnosis not present

## 2015-02-08 DIAGNOSIS — Z85828 Personal history of other malignant neoplasm of skin: Secondary | ICD-10-CM | POA: Insufficient documentation

## 2015-02-08 DIAGNOSIS — R0602 Shortness of breath: Secondary | ICD-10-CM | POA: Diagnosis not present

## 2015-02-08 DIAGNOSIS — Z8269 Family history of other diseases of the musculoskeletal system and connective tissue: Secondary | ICD-10-CM | POA: Diagnosis not present

## 2015-02-08 DIAGNOSIS — Z8543 Personal history of malignant neoplasm of ovary: Secondary | ICD-10-CM | POA: Diagnosis not present

## 2015-02-08 DIAGNOSIS — Z79899 Other long term (current) drug therapy: Secondary | ICD-10-CM | POA: Diagnosis not present

## 2015-02-08 DIAGNOSIS — I739 Peripheral vascular disease, unspecified: Secondary | ICD-10-CM | POA: Insufficient documentation

## 2015-02-08 DIAGNOSIS — M7989 Other specified soft tissue disorders: Secondary | ICD-10-CM | POA: Insufficient documentation

## 2015-02-08 DIAGNOSIS — I442 Atrioventricular block, complete: Secondary | ICD-10-CM | POA: Diagnosis not present

## 2015-02-08 DIAGNOSIS — Z87891 Personal history of nicotine dependence: Secondary | ICD-10-CM | POA: Diagnosis not present

## 2015-02-08 DIAGNOSIS — Z91048 Other nonmedicinal substance allergy status: Secondary | ICD-10-CM | POA: Insufficient documentation

## 2015-02-08 DIAGNOSIS — R079 Chest pain, unspecified: Secondary | ICD-10-CM | POA: Diagnosis present

## 2015-02-08 DIAGNOSIS — Z833 Family history of diabetes mellitus: Secondary | ICD-10-CM | POA: Insufficient documentation

## 2015-02-08 DIAGNOSIS — I428 Other cardiomyopathies: Secondary | ICD-10-CM | POA: Diagnosis not present

## 2015-02-08 DIAGNOSIS — E785 Hyperlipidemia, unspecified: Secondary | ICD-10-CM | POA: Diagnosis not present

## 2015-02-08 DIAGNOSIS — R42 Dizziness and giddiness: Secondary | ICD-10-CM | POA: Insufficient documentation

## 2015-02-08 DIAGNOSIS — R200 Anesthesia of skin: Secondary | ICD-10-CM | POA: Diagnosis not present

## 2015-02-08 DIAGNOSIS — Z86718 Personal history of other venous thrombosis and embolism: Secondary | ICD-10-CM | POA: Insufficient documentation

## 2015-02-08 DIAGNOSIS — E039 Hypothyroidism, unspecified: Secondary | ICD-10-CM | POA: Insufficient documentation

## 2015-02-08 DIAGNOSIS — Z91040 Latex allergy status: Secondary | ICD-10-CM | POA: Diagnosis not present

## 2015-02-08 DIAGNOSIS — Z91018 Allergy to other foods: Secondary | ICD-10-CM | POA: Insufficient documentation

## 2015-02-08 DIAGNOSIS — M1990 Unspecified osteoarthritis, unspecified site: Secondary | ICD-10-CM | POA: Diagnosis not present

## 2015-02-08 DIAGNOSIS — Z801 Family history of malignant neoplasm of trachea, bronchus and lung: Secondary | ICD-10-CM | POA: Insufficient documentation

## 2015-02-08 DIAGNOSIS — Z86010 Personal history of colonic polyps: Secondary | ICD-10-CM | POA: Diagnosis not present

## 2015-02-08 DIAGNOSIS — I2089 Other forms of angina pectoris: Secondary | ICD-10-CM | POA: Insufficient documentation

## 2015-02-08 HISTORY — PX: CARDIAC CATHETERIZATION: SHX172

## 2015-02-08 SURGERY — LEFT HEART CATH
Anesthesia: Moderate Sedation

## 2015-02-08 MED ORDER — SODIUM CHLORIDE 0.9 % IV SOLN: 250.0000 mL | INTRAVENOUS | Status: DC | PRN

## 2015-02-08 MED ORDER — FENTANYL CITRATE (PF) 100 MCG/2ML IJ SOLN
INTRAMUSCULAR | Status: DC | PRN
  Administered 2015-02-08 (×2): 50 ug via INTRAVENOUS

## 2015-02-08 MED ORDER — HEPARIN SODIUM (PORCINE) 1000 UNIT/ML IJ SOLN
INTRAMUSCULAR | Status: DC | PRN
  Administered 2015-02-08: 5000 [IU] via INTRAVENOUS

## 2015-02-08 MED ORDER — VERAPAMIL HCL 2.5 MG/ML IV SOLN
INTRAVENOUS | Status: AC
  Filled 2015-02-08: qty 2

## 2015-02-08 MED ORDER — HEPARIN SODIUM (PORCINE) 1000 UNIT/ML IJ SOLN
INTRAMUSCULAR | Status: AC
  Filled 2015-02-08: qty 1

## 2015-02-08 MED ORDER — IOHEXOL 300 MG/ML  SOLN
INTRAMUSCULAR | Status: DC | PRN
  Administered 2015-02-08: 85 mL via INTRA_ARTERIAL

## 2015-02-08 MED ORDER — FAMOTIDINE 20 MG PO TABS
ORAL_TABLET | ORAL | Status: AC
  Filled 2015-02-08: qty 1

## 2015-02-08 MED ORDER — HEPARIN (PORCINE) IN NACL 2-0.9 UNIT/ML-% IJ SOLN
INTRAMUSCULAR | Status: AC
  Filled 2015-02-08: qty 1000

## 2015-02-08 MED ORDER — VERAPAMIL HCL 2.5 MG/ML IV SOLN
INTRAVENOUS | Status: DC | PRN
  Administered 2015-02-08: 2.5 mg via INTRA_ARTERIAL

## 2015-02-08 MED ORDER — METHYLPREDNISOLONE SODIUM SUCC 125 MG IJ SOLR
INTRAMUSCULAR | Status: AC
  Filled 2015-02-08: qty 2

## 2015-02-08 MED ORDER — FENTANYL CITRATE (PF) 100 MCG/2ML IJ SOLN
INTRAMUSCULAR | Status: AC
  Filled 2015-02-08: qty 2

## 2015-02-08 MED ORDER — MIDAZOLAM HCL 2 MG/2ML IJ SOLN
INTRAMUSCULAR | Status: AC
  Filled 2015-02-08: qty 2

## 2015-02-08 MED ORDER — MIDAZOLAM HCL 2 MG/2ML IJ SOLN
INTRAMUSCULAR | Status: DC | PRN
  Administered 2015-02-08 (×2): 1 mg via INTRAVENOUS

## 2015-02-08 MED ORDER — SODIUM CHLORIDE 0.9 % IV SOLN
INTRAVENOUS | Status: DC
Start: 2015-02-08 — End: 2015-02-08

## 2015-02-08 MED ORDER — SODIUM CHLORIDE 0.9 % WEIGHT BASED INFUSION: 1.0000 mL/kg/h | INTRAVENOUS | Status: DC

## 2015-02-08 MED ORDER — SODIUM CHLORIDE 0.9 % IJ SOLN: 3.0000 mL | INTRAMUSCULAR | Status: DC | PRN

## 2015-02-08 MED ORDER — FAMOTIDINE 20 MG PO TABS
20.0000 mg | ORAL_TABLET | Freq: Once | ORAL | Status: AC
  Administered 2015-02-08: 20 mg via ORAL

## 2015-02-08 MED ORDER — SODIUM CHLORIDE 0.9 % IJ SOLN: 3.0000 mL | Freq: Two times a day (BID) | INTRAMUSCULAR | Status: DC

## 2015-02-08 MED ORDER — METHYLPREDNISOLONE SODIUM SUCC 125 MG IJ SOLR
125.0000 mg | Freq: Once | INTRAMUSCULAR | Status: AC
  Administered 2015-02-08: 125 mg via INTRAVENOUS

## 2015-02-08 SURGICAL SUPPLY — 7 items
CATH INFINITI 5FR ANG PIGTAIL (CATHETERS) ×2 IMPLANT
CATH OPTITORQUE JACKY 4.0 5F (CATHETERS) ×2 IMPLANT
DEVICE RAD TR BAND REGULAR (VASCULAR PRODUCTS) ×2 IMPLANT
GLIDESHEATH SLEND SS 6F .021 (SHEATH) ×2 IMPLANT
KIT MANI 3VAL PERCEP (MISCELLANEOUS) ×2 IMPLANT
PACK CARDIAC CATH (CUSTOM PROCEDURE TRAY) ×2 IMPLANT
WIRE SAFE-T 1.5MM-J .035X260CM (WIRE) ×2 IMPLANT

## 2015-02-08 NOTE — Discharge Instructions (Signed)
The drugs you were given will stay in your system until tomorrow, so for the next 24 hours you should not.  Drive an automobile. Make any legal decisions.  Drink any alcoholic beverages.  Today you should start with liquids and gradually work up to solid foods as your are able to tolerate them  Resume your regular medications as prescribed by your doctor.  Avoid aspirin for 24 hours.    Change the Band-Aid or dressing as needed.  After one day no dressing is needed.  Avoid strenuous activity for the remainder of the day.No lifting over 10 pounds with your right arm today.   Please notify your primary physician immediately if you have any unusual bleeding, trouble breathing, fever >100 degrees or pain not relieved by the medication your doctor prescribed for your doctor prescribed for you physician  Call the doctor for signs of bleeding or infection such as;  Bleeding, swelling, redness, pain that does not get better with tylenol or ibuprofen, cloudy or yellow drainage and fevers.  You may take tylenol or ibuprofen for any discomfort around the location where you had your procedure.  May return to work on Saturday 10/29

## 2015-02-08 NOTE — Interval H&P Note (Signed)
History and Physical Interval Note:  02/08/2015 7:45 AM  [name] [name]  has presented today for surgery, with the diagnosis of Lt Heart SOB  The various methods of treatment have been discussed with the patient and family. After consideration of risks, benefits and other options for treatment, the patient has consented to  Procedure(s): Left Heart Cath (N/A) as a surgical intervention .  The patient's history has been reviewed, patient examined, no change in status, stable for surgery.  I have reviewed the patient's chart and labs.  Questions were answered to the patient's satisfaction.     Kathlyn [place]

## 2015-02-08 NOTE — H&P (View-Only) (Signed)
Cardiology Office Note:  Date of Encounter: 01/31/2015  ID: [name] [name], DOB 04/05/1956, MRN [id number]  PCP:  [name], NP Primary Cardiologist:  Dr. Caryl Comes, MD  Chief Complaint  Patient presents with  . other    C/o chest pain, low BP and feeling sob. Pt refused EKG due to having had several this year/cost. Meds reviewed verbally with pt.    HPI:  59 y.o. 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 She was advised to follow up with the ED, and did so on 9/21 and had a negative CTA chest PE protocol for PE, though did have a small pericardial effusion. Troponin was negative. BNP 105. She subsequently underwent follow up echo on 9/22 that showed EF 35-40%, no RWMA, GR1DD, left atrium mildly dilated, moderate TR, PASP normal, small pericardial effusion that was posterior to the heart and circumferential to the heart. There was no evidence of hemodynamic compromise. She was started on Lasix 40 mg for 5 days, then 20 mg every other day for 10 days. She was advised to follow up in two months, but already had this appointment scheduled and decided to keep it. She presents for evaluation of ongoing exertional chest pain and SOB.   She does report feeling somewhat better since starting Lasix as above, though she continues to have exertional chest pain and SOB, especially when going up-hill. Chest pain is along the central chest and does  not radiate. Outside of her SOB there are no associated symptoms. She denies any increased weight gain, LEE, orthopnea, PND, early satiety, presyncope, syncope, diaphoresis, nausea, or vomiting. She last had the pain earlier today. She is currently chest pain free.      Past Medical History  Diagnosis Date  . Hyperlipidemia     takes Lovastatin daily  . NICM (nonischemic cardiomyopathy) ([place])     a. tachycardia induced. EF 35-40% 06/2008;   . 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 Complete AV block ([place])     a. s/p pacemaker insertion 1996; b. s/p contralateral side St. Jude generator change 11/2014  . Peripheral artery disease ([place])     stent on lt illiac  . Peripheral vascular disease ([place])   . Ovarian cancer (HCC)     ovarian, skin   . GERD (gastroesophageal reflux disease)     takes Zantac daily as needed  . Hypothyroid     takes Synthroid daily  . Anxiety     takes Klonopin daily as needed  . Hypertension     hx of-since hip replacement MD took resident off meds 10/30/14 b/c well controlled  . History of blood clots     in heart-was on Coumadin---this many yrs ago  . Headache     occasionally  . Dizziness     occasionally  . Arthritis   . Joint pain   . Joint swelling   . Numbness and  tingling     left leg and right arm  . Back pain     reason unknown  . History of colon polyps     benign  . Chronic systolic CHF (congestive heart failure) ([place])     a. echo 10/2013: EF 40-45 (apical images 35-40%), diff HK, mild MR, PASP nl; b. echo 12/2014: EF 35-40%, no RWMA, GR1DD, mod TR, PASP nl, small pericardial effusion circum to the heart, no hemo compromise  :  Past Surgical History  Procedure Laterality Date  . Pacemaker insertion  2012    Had replaced in July 2012, first placed in 1996  . Knee surgery Right   . Total abdominal hysterectomy w/ bilateral salpingoophorectomy  07/2008    Ovarian  cancer  . Insert / replace / remove pacemaker    . Iliac artery stent  Apr 05, 2010    Left artery stenting  . Angioplasty / stenting iliac    . Abdominal hysterectomy    . Hernia mesh removal Right     incisional  . Total hip arthroplasty Right 10/31/2014    Procedure: TOTAL HIP ARTHROPLASTY ANTERIOR APPROACH;  Surgeon: Hessie Knows, MD;  Location: [facility];  Service: Orthopedics;  Laterality: Right;  . Cervical cerclage  58yrs ago  . Dilation and curettage of uterus    . Colonoscopy    . Dg barium swallow ([facility] hx)    . Pacemaker lead removal Right 12/06/2014    Procedure: REMOVAL OF RV AND RA PACEMAKER LEADS;  Surgeon: Evans Lance, MD;  Location: [facility];  Service: Cardiovascular;  Laterality: Right;  DR. BARTLE TO BACK UP   . Ep implantable device Left 12/06/2014    Procedure: Implantation of cardiac resynchronization therapy pacemaker;  Surgeon: Evans Lance, MD;  Location: [facility];  Service: Cardiovascular;  Laterality: Left;  . Hernia repair    . Joint replacement  October 31, 2014    hip  . Pacemaker placement Left 12/06/14  :  Social History:  The patient  reports that she quit smoking about 6 years ago. She does not have any smokeless tobacco history on file. She reports that she drinks about 12.0 oz of alcohol per week. She reports that she does not use illicit drugs.   Family History  Problem Relation Age of Onset  . Cancer Father     died of lung cancer  . Lung cancer Father   . Diabetes Brother   . Hyperlipidemia Mother   . Polymyalgia rheumatica Mother   . Arrhythmia Son      Allergies:  Allergies  Allergen Reactions  . Marshmallow [Althaea Officinalis]     Rash, hives, itching, SOB  . Meat Extract     Rash, hives itching, SOB.  Can tolerate some meats ONLY in moderation  . Other     Jello - Rash, hives, itching, SOB  . Contrast Media [Iodinated Diagnostic Agents]     unknown  . Latex     rash  . Nickel     rash     Home Medications:  Current  Outpatient Prescriptions  Medication Sig Dispense Refill  . aspirin EC 81 MG tablet Take 81 mg by mouth daily.    . benazepril (LOTENSIN) 5 MG tablet Take 1 tablet (5 mg total) by mouth daily. 90 tablet 3  . clonazePAM (KLONOPIN) 0.5 MG tablet Take 1 tablet (0.5 mg total) by mouth daily as needed for anxiety (sleep). 30 tablet 2  . ibuprofen (ADVIL,MOTRIN) 800  MG tablet Take 1 tablet (800 mg total) by mouth every 8 (eight) hours as needed. 30 tablet 0  . levothyroxine (SYNTHROID, LEVOTHROID) 100 MCG tablet Take 1 tablet (100 mcg total) by mouth daily. 30 tablet 1  . lovastatin (MEVACOR) 20 MG tablet Take 1 tablet (20 mg total) by mouth at bedtime. 30 tablet 3  . meloxicam (MOBIC) 15 MG tablet Take 15 mg by mouth daily.    . ranitidine (ZANTAC) 150 MG capsule Take 150 mg by mouth 2 (two) times daily as needed for heartburn.    . furosemide (LASIX) 20 MG tablet Take 1 tablet (20 mg total) by mouth daily. 90 tablet 3   No current facility-administered medications for this visit.     Review of Systems:  Review of Systems  Constitutional: Positive for malaise/fatigue. Negative for fever, chills, weight loss and diaphoresis.  HENT: Negative for congestion.   Eyes: Negative for discharge and redness.  Respiratory: Positive for shortness of breath. Negative for cough, hemoptysis, sputum production and wheezing.   Cardiovascular: Positive for chest pain and leg swelling. Negative for palpitations, orthopnea, claudication and PND.       Trivial lower extremity swelling  Gastrointestinal: Negative for heartburn, nausea, vomiting and abdominal pain.  Musculoskeletal: Negative for falls.  Skin: Negative for rash.  Neurological: Positive for weakness. Negative for dizziness, tingling, tremors, sensory change, speech change, focal weakness and loss of consciousness.  Endo/Heme/Allergies: Does not bruise/bleed easily.  Psychiatric/Behavioral: Negative for substance abuse. The patient is not  nervous/anxious.      Physical Exam:  Blood pressure 92/62, pulse 83, height 5\' 5"  (9.622 m), weight 179 lb 4 oz (81.307 kg). BMI: Body mass index is 29.83 kg/(m^2). General: Pleasant, NAD. Psych: Normal affect. Responds to questions with normal affect.  Neuro: Alert and oriented X 3. Moves all extremities spontaneously. HEENT: Normocephalic, atraumatic. EOM intact. Sclera anicteric.  Neck: Trachea midline. Supple without bruits or JVD. Lungs:  Respirations regular and unlabored. CTA bilaterally without wheezing, crackles, or rhonchi.  Heart: RRR, normal s3, s4. No murmurs, rubs, or gallops.  Abdomen: Obese, soft, non-tender, non-distended, BS + x 4.  Extremities: No clubbing or cyanosis. Trivial bilateral edema. DP/PT/Radials 2+ and equal bilaterally.   Accessory Clinical Findings:  EKG: not done today  Recent Labs: 01/03/2015: B Natriuretic Peptide 105.0*; BUN 9; Creatinine, Ser 0.65; Hemoglobin 13.3; Platelets 150; Potassium 4.3; Sodium 139 01/17/2015: TSH 1.640  01/17/2015: Cholesterol, Total 173; HDL 64; LDL Calculated 94; Triglycerides 75  CrCl cannot be calculated (Patient has no serum creatinine result on file.).  Weights: Wt Readings from Last 3 Encounters:  01/31/15 179 lb 4 oz (81.307 kg)  01/17/15 178 lb 12.8 oz (81.103 kg)  01/03/15 183 lb (83.008 kg)    Other studies Reviewed: Additional studies/ records that were reviewed today include: prior office notes and imaging studies.  Assessment & Plan:  1. Unstable angina: -Given her ongoing exertional symptoms of chest pain and SOB will schedule her for cardiac catheterization -She will require pre for possible IV contrast allergy, though she is uncertain if this is a true allergy  -Continue aspirin 81 mg daily -Risks and benefits of cardiac catheterization have been discussed with the patient including risks of bleeding, bruising, infection, kidney damage, stroke, heart attack, and death. The patient understands  these risks and is willing to proceed with the procedure. All questions have been answered and concerns listened to.   2. Chronic systolic CHF: -Blood pressure precludes the restarting of beta blocker  at this time. When able would aim to start Coreg or Toprol XL -I decreased her benazepril to 5 mg daily at this time to allow for slightly more blood pressure room for a few more days of diuresis heading into her cardiac cath (Lasix and benazepril will be held pre-cath) -Blood pressure is too soft to change her from benazepril to Entresto at this time, though would continue to keep be mindful of this swap in the future if blood pressure permits  -She does not appear to be volume overloaded today  3. History of congenital complete heart block s/p St Jude pacemaker generator change 11/2014: -Stable -Followed by EP -Small post procedure pericardial effusion without hemodynamic compromise seen on both CT chest and echo in September -Lasix as above  4. HTN: -Soft blood pressures since her hip surgery -Stable  5. HLD: -Continue lovastatin for now -If intervention is required on cardiac cath would change to Lipitor    Dispo: -Follow up with Dr. Caryl Comes, MD post cardiac cath  Current medicines are reviewed at length with the patient today.  The patient did not have any concerns regarding medicines.   [name], PA-C [place] [address], [place] [address] [phone] [place] 01/31/2015, 4:30 PM

## 2015-02-12 ENCOUNTER — Other Ambulatory Visit: Payer: Self-pay | Admitting: Unknown Physician Specialty

## 2015-02-12 ENCOUNTER — Other Ambulatory Visit: Payer: Self-pay

## 2015-02-12 DIAGNOSIS — Z1231 Encounter for screening mammogram for malignant neoplasm of breast: Secondary | ICD-10-CM

## 2015-02-12 MED ORDER — MELOXICAM 15 MG PO TABS: 15.0000 mg | ORAL_TABLET | Freq: Every day | ORAL | Status: DC

## 2015-02-12 NOTE — Telephone Encounter (Signed)
Patient was last seen 01/17/15 and [facility].

## 2015-02-13 ENCOUNTER — Ambulatory Visit
Admission: RE | Admit: 2015-02-13 | Discharge: 2015-02-13 | Disposition: A | Discharge status: Home / Self Care | Payer: BLUE CROSS/BLUE SHIELD | Source: Ambulatory Visit | Attending: Unknown Physician Specialty | Admitting: Unknown Physician Specialty

## 2015-02-13 DIAGNOSIS — Z1231 Encounter for screening mammogram for malignant neoplasm of breast: Secondary | ICD-10-CM | POA: Diagnosis present

## 2015-02-22 ENCOUNTER — Encounter: Payer: Self-pay | Admitting: Nurse Practitioner

## 2015-02-22 ENCOUNTER — Ambulatory Visit (INDEPENDENT_AMBULATORY_CARE_PROVIDER_SITE_OTHER): Payer: BLUE CROSS/BLUE SHIELD | Admitting: Nurse Practitioner

## 2015-02-22 VITALS — BP 100/70 | HR 68 | Ht 65.0 in | Wt 178.8 lb

## 2015-02-22 DIAGNOSIS — I5022 Chronic systolic (congestive) heart failure: Secondary | ICD-10-CM

## 2015-02-22 DIAGNOSIS — I429 Cardiomyopathy, unspecified: Secondary | ICD-10-CM | POA: Diagnosis not present

## 2015-02-22 DIAGNOSIS — I428 Other cardiomyopathies: Secondary | ICD-10-CM

## 2015-02-22 MED ORDER — DIGOXIN 125 MCG PO TABS: 0.1250 mg | ORAL_TABLET | Freq: Every day | ORAL | Status: DC

## 2015-02-22 NOTE — Patient Instructions (Signed)
Medication Instructions:  Your physician has recommended you make the following change in your medication:  START taking digoxin 0.125mg  once per day   Labwork: Digoxin level in 10 days  Testing/Procedures: none  Follow-Up: Keep your follow up appointment with Dr. Caryl Comes   Any Other Special Instructions Will Be Listed Below (If Applicable). Buy a set of scales and weigh yourself daily. Same time each day     If you need a refill on your cardiac medications before your next appointment, please call your pharmacy.

## 2015-02-22 NOTE — Progress Notes (Signed)
Patient Name: Albie D Tisdel Date of Encounter: 02/22/2015  Primary Care Provider:  [name], NP Primary Cardiologist:  Olin Pia, MD   Chief Complaint  59 y/o female with a h/o NICM who presents for f/u after recent catheterization.  Past Medical History   Past Medical History  Diagnosis Date  . Hyperlipidemia     takes Lovastatin daily  . NICM (nonischemic cardiomyopathy) ([place])     a. tachycardia induced->EF 40-45% (2010), 35-40% (10/2013), 35-40% Gr1 DD, mod TR (12/2014);  b. 01/2015 Cath: Nl cors, EF 30-35%, glob HK.  Mar[place] Kitchen Pacemaker lead failure--noise on atrial greater than ventricular lead with ventricular backup pulse pacing 10/17/2014  . Complete AV block ([place])     a. s/p pacemaker insertion 1996; b. s/p contralateral side St. Jude generator change 11/2014.  Marland Kitchen Peripheral artery disease ([place])     stent on lt illiac  . Ovarian cancer (HCC)     ovarian, skin   . GERD (gastroesophageal reflux disease)     takes Zantac daily as needed  . Hypothyroid     takes Synthroid daily  . Anxiety     takes Klonopin daily as needed  . Hypertension     hx of-since hip replacement MD took resident off meds 10/30/14 b/c well controlled  . History of blood clots     in heart-was on Coumadin---this many yrs ago  . Headache     occasionally  . Dizziness     occasionally  . Arthritis   . Joint pain   . Joint swelling   . Numbness and tingling     left leg and right arm  . Back pain     reason unknown  . History of colon polyps     benign  . Chronic systolic CHF (congestive heart failure) ([place])     a. echo 10/2013: EF 40-45 (apical images 35-40%), diff HK, mild MR, PASP nl; b. echo 12/2014: EF 35-40%, no RWMA, GR1DD, mod TR, PASP nl, small pericardial effusion circum to the heart, no hemo compromise.   Past Surgical History  Procedure Laterality Date  . Pacemaker insertion  2012    Had replaced in July 2012, first placed in 1996  . Knee surgery Right   . Total abdominal  hysterectomy w/ bilateral salpingoophorectomy  07/2008    Ovarian cancer  . Insert / replace / remove pacemaker    . Iliac artery stent  Apr 05, 2010    Left artery stenting  . Angioplasty / stenting iliac    . Abdominal hysterectomy    . Hernia mesh removal Right     incisional  . Total hip arthroplasty Right 10/31/2014    Procedure: TOTAL HIP ARTHROPLASTY ANTERIOR APPROACH;  Surgeon: Hessie Knows, MD;  Location: [facility];  Service: Orthopedics;  Laterality: Right;  . Cervical cerclage  15yrs ago  . Dilation and curettage of uterus    . Colonoscopy    . Dg barium swallow ([facility] hx)    . Pacemaker lead removal Right 12/06/2014    Procedure: REMOVAL OF RV AND RA PACEMAKER LEADS;  Surgeon: Evans Lance, MD;  Location: [facility];  Service: Cardiovascular;  Laterality: Right;  DR. BARTLE TO BACK UP   . Ep implantable device Left 12/06/2014    Procedure: Implantation of cardiac resynchronization therapy pacemaker;  Surgeon: Evans Lance, MD;  Location: [place];  Service: Cardiovascular;  Laterality: Left;  . Hernia repair    . Joint replacement  October 31, 2014    hip  . Pacemaker placement Left 12/06/14  . Cardiac catheterization N/A 02/08/2015    Procedure: Left Heart Cath and Coronary Angiography;  Surgeon: Wellington Hampshire, MD;  Location: [place] [facility];  Service: Cardiovascular;  Laterality: N/A;    Allergies  Allergies  Allergen Reactions  . Marshmallow [Althaea Officinalis]     Rash, hives, itching, SOB  . Meat Extract     Rash, hives itching, SOB.  Can tolerate some meats ONLY in moderation  . Other     Jello - Rash, hives, itching, SOB  . Contrast Media [Iodinated Diagnostic Agents]     unknown  . Latex     rash  . Nickel     rash    HPI  59 y/o female with the above complex problem list.  She has a h/o NICM and was recently seen in clinic related to exertional chest pain.  She underwent cath on 10/27 revealing nl cors and an EF of 30-35% with global HK.  Since her  cath, she has done well.  She exercises daily (walking) and has not been experiencing recurrent chest pain or dyspnea.  She thinks that the addition of low dose lasix to regimen @ her last visit has made a real difference for her.  She no longer has lower extremity edema.  She denies palpitations, pnd, orthopnea, n, v, dizziness, syncope, weight gain, or early satiety. She is not weighing herself daily.  Home Medications  Prior to Admission medications   Medication Sig Start Date End Date Taking? Authorizing Provider  aspirin EC 81 MG tablet Take 81 mg by mouth daily.   Yes Historical Provider, MD  benazepril (LOTENSIN) 5 MG tablet Take 1 tablet (5 mg total) by mouth daily. 01/31/15  Yes [name] M Dunn, PA-C  clonazePAM (KLONOPIN) 0.5 MG tablet Take 1 tablet (0.5 mg total) by mouth daily as needed for anxiety (sleep). 01/17/15  Yes [name], NP  furosemide (LASIX) 20 MG tablet Take 1 tablet (20 mg total) by mouth daily. 01/31/15  Yes [name] M Dunn, PA-C  ibuprofen (ADVIL,MOTRIN) 800 MG tablet Take 1 tablet (800 mg total) by mouth every 8 (eight) hours as needed. 01/03/15  Yes Earleen Newport, MD  levothyroxine (SYNTHROID, LEVOTHROID) 100 MCG tablet Take 1 tablet (100 mcg total) by mouth daily. 01/15/15  Yes [name], NP  lovastatin (MEVACOR) 20 MG tablet Take 1 tablet (20 mg total) by mouth at bedtime. 09/26/14  Yes [name], NP  meloxicam (MOBIC) 15 MG tablet Take 1 tablet (15 mg total) by mouth daily. 02/12/15  Yes [name], NP  ranitidine (ZANTAC) 150 MG capsule Take 150 mg by mouth 2 (two) times daily as needed for heartburn.   Yes Historical Provider, MD  digoxin (LANOXIN) 0.125 MG tablet Take 1 tablet (0.125 mg total) by mouth daily. 02/22/15   Rogelia Mire, NP    Review of Systems  As above, doing well.  She denies chest pain, palpitations, dyspnea, pnd, orthopnea, n, v, dizziness, syncope, edema, weight gain, or early satiety. All other systems reviewed and are  otherwise negative except as noted above.  Physical Exam  VS:  BP 100/70 mmHg  Pulse 68  Ht 5\' 5"  (1.651 m)  Wt 178 lb 12 oz (81.08 kg)  BMI 29.75 kg/m2  LMP  (LMP Unknown) , BMI Body mass index is 29.75 kg/(m^2). GEN: Well nourished, well developed, in no acute distress. HEENT: normal. Neck: Supple, no JVD, carotid  bruits, or masses. Cardiac: RRR, 2/6 insignificant murmur @ the RUSB, no rubs, or gallops. No clubbing, cyanosis, edema.  Radials/DP/PT 2+ and equal bilaterally. R wrist cath site w/o bleeding/bruit/hematoma. Respiratory:  Respirations regular and unlabored, clear to auscultation bilaterally. GI: Soft, nontender, nondistended, BS + x 4. MS: no deformity or atrophy. Skin: warm and dry, no rash. Neuro:  Strength and sensation are intact. Psych: Normal affect.  Accessory Clinical Findings  ECG - none - pt refused.  Assessment & Plan  1.  NICM/Chronic systolic CHF:  S/p recent catheterization in the setting of exertional chest pain revealing normal cors.  She has been doing much better since initiation of low-dose lasix and has not been having any chest pain or dyspnea.  Edema has resolved.  She is not weighing herself daily and we discussed the importance of daily weights, sodium restriction, medication compliance, and symptom reporting and she verbalizes understanding.  She remains on low dose ACEi therapy and relative hypotension prevents [place] from adding bb/spiro or switching her to entresto.  I will add digoxin 0.125 mg daily today and we will f/u a digoxin level in roughly 10 days.  I've encouraged her to purchase a scale and she thinks that she will now start weighing herself daily.  2.  H/O CHB:  S/p PPM with gen change earlier this year.  She has f/u with Dr. Caryl Comes in December.  3. Dispo:  F/U with Dr. Caryl Comes as scheduled.  F/U Digoxin level in 10 days.   Murray Hodgkins, NP 02/22/2015, 3:18 PM

## 2015-02-23 ENCOUNTER — Telehealth: Payer: Self-pay | Admitting: *Deleted

## 2015-02-23 NOTE — Telephone Encounter (Signed)
1) Patient has NICM/chronic systolic CHF (not due to a blockage) 2) digoxin was added 2/2 help with her heart failure/pump as her soft BP preclude the addition of Entreso or beta blocker/spironolactone.  3) There is no interaction between digoxin and Mobic. The listing of digoxin and NSAIDs is 2/2 renal clearance of both medications and is listed as a precaution. As long as her medication level remains therapeutic at her recheck it is ok for her to take both. She could alternatively take Tylenol in place of Mobic.

## 2015-02-23 NOTE — Telephone Encounter (Signed)
1.Pt was reading her precoutions and in there for new medication Digixon  On that it give a precaution about taking NSAID's and she does take one every morning.  She is just worried about the drug interaction.  That medication is called Meloxicam  2.Reading over her notes, and reason for visit CHF. Wants to know is that was pt has?  Please call patient. She asked if she can be called today some time.

## 2015-02-23 NOTE — Telephone Encounter (Signed)
Pt prescribed digoxin 0.125mg  qd at 11/10 OV. She takes 15mg  Meloxicam qd and is concerned of a drug interaction as information from pharmacy states precautions w/NSAIDs. Forward to [place] to advise.

## 2015-02-23 NOTE — Telephone Encounter (Signed)
S/w pt of [name]'s notes. Pt verbalized understanding w/no further questions.

## 2015-02-23 NOTE — Telephone Encounter (Signed)
S/w pt regarding meloxicam and digoxin. She states she is "not that worried" but want to be sure they are safe to take together. Awaiting response from [place]. Reviewed CHF diagnosis. Pt verbalized understanding and states she will continue to take meds as directed until she hears from [place].

## 2015-02-26 ENCOUNTER — Other Ambulatory Visit: Payer: Self-pay | Admitting: Unknown Physician Specialty

## 2015-03-02 ENCOUNTER — Other Ambulatory Visit: Payer: BLUE CROSS/BLUE SHIELD

## 2015-03-02 NOTE — Telephone Encounter (Signed)
This encounter was created in error - please disregard.

## 2015-03-05 ENCOUNTER — Other Ambulatory Visit (INDEPENDENT_AMBULATORY_CARE_PROVIDER_SITE_OTHER): Payer: BLUE CROSS/BLUE SHIELD | Admitting: *Deleted

## 2015-03-05 DIAGNOSIS — I428 Other cardiomyopathies: Secondary | ICD-10-CM

## 2015-03-05 DIAGNOSIS — I429 Cardiomyopathy, unspecified: Secondary | ICD-10-CM

## 2015-03-06 LAB — DIGOXIN LEVEL: Digoxin, Serum: 0.5 ng/mL — ABNORMAL LOW (ref 0.9–2.0)

## 2015-03-13 ENCOUNTER — Other Ambulatory Visit: Payer: Self-pay

## 2015-03-13 MED ORDER — LEVOTHYROXINE SODIUM 100 MCG PO TABS: 100.0000 ug | ORAL_TABLET | Freq: Every day | ORAL | Status: DC

## 2015-03-13 MED ORDER — LOVASTATIN 20 MG PO TABS: 20.0000 mg | ORAL_TABLET | Freq: Every day | ORAL | Status: DC

## 2015-03-13 MED ORDER — MELOXICAM 15 MG PO TABS: 15.0000 mg | ORAL_TABLET | Freq: Every day | ORAL | Status: DC

## 2015-03-13 NOTE — Telephone Encounter (Signed)
Patient notified of the medications that were filled.  Patient says she does not take ibprophen. She got prescribed ibprophen from the ER one time for chest pains, but she has not taking it.

## 2015-03-13 NOTE — Telephone Encounter (Signed)
I'm OK with 90 day supply, but make sure she is not taking Ibuprofen in addition to the Meloxicam.  I cannot give a 90 day supply of the clonazepam.

## 2015-03-13 NOTE — Telephone Encounter (Signed)
LAST VISIT: 01/21/2015  Patient called stating she needed a refill on her meloxicam. She also stated she'd like the rest of her medications back on a 90 day supply. She said they used to be, but now are back on 30. She said the ones that are fixable back to 90 day she would appreciate.  Pharmacy [facility]

## 2015-03-20 ENCOUNTER — Ambulatory Visit (INDEPENDENT_AMBULATORY_CARE_PROVIDER_SITE_OTHER): Payer: BLUE CROSS/BLUE SHIELD | Admitting: Internal Medicine

## 2015-03-20 ENCOUNTER — Encounter: Payer: Self-pay | Admitting: Internal Medicine

## 2015-03-20 VITALS — BP 116/80 | HR 70 | Ht 65.0 in | Wt 179.5 lb

## 2015-03-20 DIAGNOSIS — I5022 Chronic systolic (congestive) heart failure: Secondary | ICD-10-CM

## 2015-03-20 DIAGNOSIS — I442 Atrioventricular block, complete: Secondary | ICD-10-CM

## 2015-03-20 DIAGNOSIS — I10 Essential (primary) hypertension: Secondary | ICD-10-CM | POA: Diagnosis not present

## 2015-03-20 DIAGNOSIS — I429 Cardiomyopathy, unspecified: Secondary | ICD-10-CM | POA: Diagnosis not present

## 2015-03-20 NOTE — Progress Notes (Signed)
Patient Care Team: [name], NP as PCP - General (Nurse Practitioner)   HPI  [name] [name] is a 59 y.o. female Seen to reestablish pacemaker followup. 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 This was initially implanted in 1990s 8/ 16 She underwent extraction as there was noise on both her atrial and ventricular leads and new device implantation contralaterally.  There was concern for radiation injury given prolonged exposure and hence LV lead placement was not  further pursued after long difficulty in cannulating the concern sinus  She has a history of a nonischemic cardiomyopathy; she has significant peripheral vascular disease with remote revascularization.  DATE TEST    2010 /  echo 45   7/*15 echo   EF 40-45   7/16 myoview EF 40% No ischemia  10/16 XCath EF 30-35% CA patent     Following recent hospitalization for chest pain 10/16, she underwent catheterization demonstrating patent coronary arteries; her ejection fraction had decreased however 30-35%.      She is coming in for 3 months reprogramming; she is exercising more she is 30 pounds. She denies significant chest pain or shortness of breath.  Past Medical History  Diagnosis Date  . Hyperlipidemia     takes Lovastatin daily  . NICM (nonischemic cardiomyopathy) ([place])     a. tachycardia induced->EF 40-45% (2010), 35-40% (10/2013), 35-40% Gr1 DD, mod TR (12/2014);  b. 01/2015 Cath: Nl cors, EF 30-35%, glob HK.  Marland Kitchen Pacemaker lead failure--noise on atrial greater than ventricular lead with ventricular backup pulse pacing 10/17/2014  . Complete AV block ([place])     a. s/p pacemaker insertion 1996; b. s/p contralateral side St. Jude generator change 11/2014.  Marland Kitchen Peripheral artery disease ([place])     stent on lt illiac  . Ovarian cancer ([facility])     ovarian, skin   . GERD (gastroesophageal reflux disease)     takes Zantac daily as needed  . Hypothyroid     takes Synthroid daily  . Anxiety      takes Klonopin daily as needed  . Hypertension     hx of-since hip replacement MD took resident off meds 10/30/14 b/c well controlled  . History of blood clots     in heart-was on Coumadin---this many yrs ago  . Headache     occasionally  . Dizziness     occasionally  . Arthritis   . Joint pain   . Joint swelling   . Numbness and tingling     left leg and right arm  . Back pain     reason unknown  . History of colon polyps     benign  . Chronic systolic CHF (congestive heart failure) ([place])     a. echo 10/2013: EF 40-45 (apical images 35-40%), diff HK, mild MR, PASP nl; b. echo 12/2014: EF 35-40%, no RWMA, GR1DD, mod TR, PASP nl, small pericardial effusion circum to the heart, no hemo compromise.    Past Surgical History  Procedure Laterality Date  . Pacemaker insertion  2012    Had replaced in July 2012, first placed in 1996  . Knee surgery Right   . Total abdominal hysterectomy w/ bilateral salpingoophorectomy  07/2008    Ovarian cancer  . Insert / replace / remove pacemaker    . Iliac artery stent  Apr 05, 2010    Left artery stenting  . Angioplasty / stenting iliac    . Abdominal hysterectomy    .  Hernia mesh removal Right     incisional  . Total hip arthroplasty Right 10/31/2014    Procedure: TOTAL HIP ARTHROPLASTY ANTERIOR APPROACH;  Surgeon: Hessie Knows, MD;  Location: [facility];  Service: Orthopedics;  Laterality: Right;  . Cervical cerclage  70yrs ago  . Dilation and curettage of uterus    . Colonoscopy    . Dg barium swallow ([facility] hx)    . Pacemaker lead removal Right 12/06/2014    Procedure: REMOVAL OF RV AND RA PACEMAKER LEADS;  Surgeon: Evans Lance, MD;  Location: [facility];  Service: Cardiovascular;  Laterality: Right;  DR. BARTLE TO BACK UP   . Ep implantable device Left 12/06/2014    Procedure: Implantation of cardiac resynchronization therapy pacemaker;  Surgeon: Evans Lance, MD;  Location: [facility];  Service: Cardiovascular;  Laterality: Left;  . Hernia  repair    . Joint replacement  October 31, 2014    hip  . Pacemaker placement Left 12/06/14  . Cardiac catheterization N/A 02/08/2015    Procedure: Left Heart Cath and Coronary Angiography;  Surgeon: Wellington Hampshire, MD;  Location: [facility];  Service: Cardiovascular;  Laterality: N/A;    Current Outpatient Prescriptions  Medication Sig Dispense Refill  . aspirin EC 81 MG tablet Take 81 mg by mouth daily.    . benazepril (LOTENSIN) 5 MG tablet Take 1 tablet (5 mg total) by mouth daily. 90 tablet 3  . clonazePAM (KLONOPIN) 0.5 MG tablet Take 1 tablet (0.5 mg total) by mouth daily as needed for anxiety (sleep). 30 tablet 2  . digoxin (LANOXIN) 0.125 MG tablet Take 1 tablet (0.125 mg total) by mouth daily. 90 tablet 3  . furosemide (LASIX) 20 MG tablet Take 1 tablet (20 mg total) by mouth daily. 90 tablet 3  . levothyroxine (SYNTHROID, LEVOTHROID) 100 MCG tablet Take 1 tablet (100 mcg total) by mouth daily. 90 tablet 1  . lovastatin (MEVACOR) 20 MG tablet Take 1 tablet (20 mg total) by mouth at bedtime. 90 tablet 3  . meloxicam (MOBIC) 15 MG tablet Take 1 tablet (15 mg total) by mouth daily. 90 tablet 0  . ranitidine (ZANTAC) 150 MG capsule Take 150 mg by mouth 2 (two) times daily as needed for heartburn.     No current facility-administered medications for this visit.    Allergies  Allergen Reactions  . Marshmallow [Althaea Officinalis]     Rash, hives, itching, SOB  . Meat Extract     Rash, hives itching, SOB.  Can tolerate some meats ONLY in moderation  . Other     Jello - Rash, hives, itching, SOB  . Contrast Media [Iodinated Diagnostic Agents]     unknown  . Latex     rash  . Nickel     rash    Review of Systems negative except from HPI and PMH  Physical Exam BP 116/80 mmHg  Pulse 70  Ht 5\' 5"  (1.651 m)  Wt 179 lb 8 oz (81.421 kg)  BMI 29.87 kg/m2  LMP  (LMP Unknown) Well developed and well nourished in no acute distress HENT normal E scleral and icterus  clear Neck Supple JVP 8-10 ; carotids brisk and full Clear to ausculation  Regular rate and rhythm, no murmurs gallops or rub Soft with active bowel sounds No clubbing cyanosis  Edema Alert and oriented, grossly normal motor and sensory function Skin Warm and Dry  ECG demonstrates P. Synchronous pacing  Assessment and  Plan  Complete  heart block-congenital  Pacemaker-St. Jude  NonsichemicCardiomyopathy interval worsening  EF 30-35%  Peripheral vascular disease status post iliac stenting  Pacemaker lead revision dual chamber CRT-P can with failure to cannulate the coronary sinus  The patient has significant financial struggles. As she is symptomatically stable, we will continue her on her current medications. Her low blood pressure may preclude the use of Entresto. She is tolerating digoxin. Level was 0.5  3 weeks ago which is reasonable.  We have discussed the interval change in her left ventricular function. We'll hold off on any pain at this time because of cost concerns and the fact that she is symptomatically better. She continues to exercise and has lost a total of 30 pounds 6 year

## 2015-03-20 NOTE — Patient Instructions (Signed)
Medication Instructions: - no changes  Labwork: - none  Procedures/Testing: - none  Follow-Up: - Remote monitoring is used to monitor your Pacemaker of ICD from home. This monitoring reduces the number of office visits required to check your device to one time per year. It allows [place] to keep an eye on the functioning of your device to ensure it is working properly. You are scheduled for a device check from home on 06/19/15. You may send your transmission at any time that day. If you have a wireless device, the transmission will be sent automatically. After your physician reviews your transmission, you will receive a postcard with your next transmission date.  - Your physician wants you to follow-up in: 6 months with Dr. Caryl Comes. You will receive a reminder letter in the mail two months in advance. If you don't receive a letter, please call our office to schedule the follow-up appointment.  Any Additional Special Instructions Will Be Listed Below (If Applicable). - none

## 2015-03-21 LAB — CUP PACEART INCLINIC DEVICE CHECK
Date Time Interrogation Session: 20161207085424
Implantable Lead Implant Date: 20160824
Implantable Lead Location: 753859
Lead Channel Setting Pacing Amplitude: 3.5 V
Lead Channel Setting Pacing Pulse Width: 0.5 ms
MDC IDC LEAD IMPLANT DT: 20160824
MDC IDC LEAD LOCATION: 753860
MDC IDC PG SERIAL: 7802897
MDC IDC SET LEADCHNL RV PACING AMPLITUDE: 3.5 V
MDC IDC SET LEADCHNL RV SENSING SENSITIVITY: 4 mV

## 2015-04-03 ENCOUNTER — Encounter: Payer: Self-pay | Admitting: Internal Medicine

## 2015-06-19 ENCOUNTER — Encounter: Payer: BLUE CROSS/BLUE SHIELD | Admitting: *Deleted

## 2015-07-18 ENCOUNTER — Ambulatory Visit (INDEPENDENT_AMBULATORY_CARE_PROVIDER_SITE_OTHER): Payer: BLUE CROSS/BLUE SHIELD | Admitting: Unknown Physician Specialty

## 2015-07-18 ENCOUNTER — Encounter: Payer: Self-pay | Admitting: Unknown Physician Specialty

## 2015-07-18 VITALS — BP 105/69 | HR 68 | Temp 97.7°F | Ht 64.5 in | Wt 176.0 lb

## 2015-07-18 DIAGNOSIS — Z8543 Personal history of malignant neoplasm of ovary: Secondary | ICD-10-CM | POA: Diagnosis not present

## 2015-07-18 DIAGNOSIS — N952 Postmenopausal atrophic vaginitis: Secondary | ICD-10-CM | POA: Insufficient documentation

## 2015-07-18 DIAGNOSIS — I10 Essential (primary) hypertension: Secondary | ICD-10-CM | POA: Diagnosis not present

## 2015-07-18 DIAGNOSIS — E785 Hyperlipidemia, unspecified: Secondary | ICD-10-CM | POA: Diagnosis not present

## 2015-07-18 MED ORDER — LOVASTATIN 20 MG PO TABS: 20.0000 mg | ORAL_TABLET | Freq: Every day | ORAL | Status: DC

## 2015-07-18 MED ORDER — ESTRADIOL 10 MCG VA TABS: 10.0000 ug | ORAL_TABLET | VAGINAL | Status: DC

## 2015-07-18 NOTE — Progress Notes (Signed)
BP 105/69 mmHg  Pulse 68  Temp(Src) 97.7 F (36.5 C)  Ht 5' 4.5" (1.638 m)  Wt 176 lb (79.833 kg)  BMI 29.75 kg/m2  SpO2 97%  LMP  (LMP Unknown)   Subjective:    Patient ID: [name] [name], female    DOB: 05/15/1955, 60 y.o.   MRN: [id number]  HPI: [name] [name] is a 60 y.o. female  Chief Complaint  Patient presents with  . Hyperlipidemia  . Hypertension  . Hypothyroidism  . vaginal burning    pt states she has been having vaginal burning for a few months now   Hypertension Medications titrated through cardiology Using medications without difficulty Average home BPs: not checking  No problems or lightheadedness No chest pain with exertion or shortness of breath No Edema   Hyperlipidemia Using medications without problems: No Muscle aches  Diet compliance:  Exercise:   Thyroid Problem Presents for follow-up (Hypothyroid) visit. Patient reports no anxiety, depressed mood or dry skin. The symptoms have been stable.   Vaginal burning X 2 months. No particular aggravating or relieving factors.  No discharge  Relevant past medical, surgical, family and social history reviewed and updated as indicated. Interim medical history since our last visit reviewed. Allergies and medications reviewed and updated.  Review of Systems  Psychiatric/Behavioral: The patient is not nervous/anxious.     Per HPI unless specifically indicated above     Objective:    BP 105/69 mmHg  Pulse 68  Temp(Src) 97.7 F (36.5 C)  Ht 5' 4.5" (1.638 m)  Wt 176 lb (79.833 kg)  BMI 29.75 kg/m2  SpO2 97%  LMP  (LMP Unknown)  Wt Readings from Last 3 Encounters:  07/18/15 176 lb (79.833 kg)  03/20/15 179 lb 8 oz (81.421 kg)  02/22/15 178 lb 12 oz (81.08 kg)    Physical Exam  Constitutional: She is oriented to person, place, and time. She appears well-developed and well-nourished. No distress.  HENT:  Head: Normocephalic and atraumatic.  Eyes: Conjunctivae and lids are normal. Right  eye exhibits no discharge. Left eye exhibits no discharge. No scleral icterus.  Neck: Normal range of motion. Neck supple. No JVD present. Carotid bruit is not present.  Cardiovascular: Normal rate, regular rhythm and normal heart sounds.   Pulmonary/Chest: Effort normal and breath sounds normal.  Abdominal: Normal appearance. There is no splenomegaly or hepatomegaly.  Genitourinary: Vagina normal. There is no rash, tenderness, lesion or injury on the right labia. There is no rash, tenderness, lesion or injury on the left labia.  Musculoskeletal: Normal range of motion.  Neurological: She is alert and oriented to person, place, and time.  Skin: Skin is warm, dry and intact. No rash noted. No pallor.  Psychiatric: She has a normal mood and affect. Her behavior is normal. Judgment and thought content normal.       Assessment & Plan:   Problem List Items Addressed This Visit      Unprioritized   Atrophic vaginitis   Relevant Orders   CA 125   Essential hypertension - Primary    Medications tapered through cardiology      Relevant Medications   lovastatin (MEVACOR) 20 MG tablet   Other Relevant Orders   Comprehensive metabolic panel   History of ovarian cancer    Check CA-125      Hyperlipidemia    Check lipid panel      Relevant Medications   lovastatin (MEVACOR) 20 MG tablet   Other Relevant Orders  Lipid Panel w/o Chol/HDL Ratio       Follow up plan: Return in about 6 months (around 01/17/2016) for for physical in 6 months.      +

## 2015-07-18 NOTE — Assessment & Plan Note (Signed)
Check lipid panel  

## 2015-07-18 NOTE — Assessment & Plan Note (Signed)
Check CA 125 

## 2015-07-18 NOTE — Assessment & Plan Note (Signed)
Medications tapered through cardiology

## 2015-07-19 LAB — COMPREHENSIVE METABOLIC PANEL
ALBUMIN: 4.5 g/dL (ref 3.5–5.5)
ALK PHOS: 125 IU/L — AB (ref 39–117)
ALT: 10 IU/L (ref 0–32)
AST: 18 IU/L (ref 0–40)
Albumin/Globulin Ratio: 1.6 (ref 1.2–2.2)
BILIRUBIN TOTAL: 0.5 mg/dL (ref 0.0–1.2)
BUN / CREAT RATIO: 14 (ref 9–23)
BUN: 13 mg/dL (ref 6–24)
CHLORIDE: 92 mmol/L — AB (ref 96–106)
CO2: 27 mmol/L (ref 18–29)
Calcium: 9.9 mg/dL (ref 8.7–10.2)
Creatinine, Ser: 0.92 mg/dL (ref 0.57–1.00)
GFR calc non Af Amer: 69 mL/min/{1.73_m2} (ref >59)
GFR, EST AFRICAN AMERICAN: 80 mL/min/{1.73_m2} (ref >59)
GLOBULIN, TOTAL: 2.9 g/dL (ref 1.5–4.5)
Glucose: 97 mg/dL (ref 65–99)
Potassium: 5.2 mmol/L (ref 3.5–5.2)
Sodium: 134 mmol/L (ref 134–144)
TOTAL PROTEIN: 7.4 g/dL (ref 6.0–8.5)

## 2015-07-19 LAB — LIPID PANEL W/O CHOL/HDL RATIO
Cholesterol, Total: 165 mg/dL (ref 100–199)
HDL: 58 mg/dL (ref >39)
LDL Calculated: 87 mg/dL (ref 0–99)
Triglycerides: 100 mg/dL (ref 0–149)
VLDL Cholesterol Cal: 20 mg/dL (ref 5–40)

## 2015-07-19 LAB — CA 125: CA 125: 16.4 U/mL (ref 0.0–38.1)

## 2015-07-20 ENCOUNTER — Telehealth: Payer: Self-pay | Admitting: Unknown Physician Specialty

## 2015-07-20 DIAGNOSIS — Z8543 Personal history of malignant neoplasm of ovary: Secondary | ICD-10-CM

## 2015-07-20 NOTE — Telephone Encounter (Signed)
Increase in CA 125 but still WNL.  Call to cancer center confirms she needs to be rechecked.  Message left with pt on mychart and on phone.  Referral placed.

## 2015-07-23 ENCOUNTER — Telehealth: Payer: Self-pay | Admitting: Unknown Physician Specialty

## 2015-07-23 NOTE — Telephone Encounter (Signed)
Discussed CA 125.  She is being referred to the cancer center in [place].

## 2015-07-23 NOTE — Telephone Encounter (Signed)
Routing to provider  

## 2015-07-23 NOTE — Telephone Encounter (Signed)
Pt would like to talk about her recent lab results.  Please call her back on her cell phone.

## 2015-07-25 ENCOUNTER — Telehealth: Payer: Self-pay | Admitting: *Deleted

## 2015-07-25 ENCOUNTER — Telehealth: Payer: Self-pay | Admitting: Unknown Physician Specialty

## 2015-07-25 NOTE — Telephone Encounter (Signed)
Routing to provider just FYI.

## 2015-07-25 NOTE — Telephone Encounter (Signed)
Called to inform patient of her appointment with GYN ONC . Patient stated she did not want to come it was too expensive.Pt was advise to still come and speak with the financial counselors. Pt stated "Everytime  that I walk in the building I have to pay $300, the counselors can't help me, my husband and I make too much. I would just like for the appointment to be canceled." Appointment was canceled, pt was informed to call gyn onc at [phone] if she reconsidered being seen. The patient's PCP [name], NP  Office was notified of appointment cancelation.

## 2015-07-25 NOTE — Telephone Encounter (Signed)
Kim from [facility] called stated that the pt declined the appt due to the expense. Thanks.

## 2015-07-27 ENCOUNTER — Telehealth: Payer: Self-pay

## 2015-07-27 DIAGNOSIS — Z8543 Personal history of malignant neoplasm of ovary: Secondary | ICD-10-CM

## 2015-07-27 NOTE — Telephone Encounter (Signed)
  Oncology Nurse Navigator Documentation  Navigator Location: [facility] (07/27/15 1500) Navigator Encounter Type: Introductory phone call (07/27/15 1500)   Abnormal Finding Date: 07/18/15 (07/27/15 1500)           Barriers/Navigation Needs: Financial (07/27/15 1500)                          Time Spent with Patient: 15 (07/27/15 1500)   Voicemail left on home number to return call. She declioned GYN consult at [facility] due to financial reasons. [name] her NP would like [place] to reach out to her to explain the importance of referral. CA-125 has doubled from 8 to 16 and this may be evidence of a recurrence of her ovarian cancer. Radiologic exams would be warranted to assist in locating a potential recurrence. Will make another copntact attempt if she fails to return call.

## 2015-07-27 NOTE — Telephone Encounter (Signed)
  Oncology Nurse Navigator Documentation  Navigator Location: [facility] (07/27/15 1557) Navigator Encounter Type: Introductory phone call;Education;Screening;Diagnostic Results (07/27/15 1557)               Barriers/Navigation Needs: Financial;Education;Coordination of Care (07/27/15 1557)   Interventions: Education Method;Coordination of Care (07/27/15 1557)   Coordination of Care: Appts (07/27/15 1557) Education Method: Verbal (07/27/15 1557)      Acuity: Level 2 (07/27/15 1557)   Acuity Level 2: Initial guidance, education and coordination as needed;Educational needs;Ongoing guidance and education throughout treatment as needed (07/27/15 1557)     Time Spent with Patient: 30 (07/27/15 1557)   Received call back from Ms Barresi. Educated on Ca-125, what it is and why it is followed for people who have had ovarian cancer. Educated that once your personal baseline after surgery has doubled that could indicate a reoccurence in her cancer and at  that time radiographs such as CT or PET scan are ordered. She states her CA-125 was 11 in 2016. It was 8.3  four years ago. We discussed the importance of having a Gyn Onc do an exam. We discussed her financial situation at length. At this time she does not want to see Gyn Onc or have any scans performed. She and her spouse have discussed what to do. She would like to have her CA-125 redrawn in 3 months and go from there. She did ask if she could come to [place] for any future care. I instructed her that we have [place] surgeons that come to the [facility] on a weekly basis to see patients. She would just need to let [name] NP know about this change. I told her that I would let Malachy Mood know about rechecking her CA-125 in 3 months to see if she agrees with that plan and could get her staff to arrange for that. She has my contact information for any further questions or concerns. At this time she does report "vaginal burning"  as her only symptom and is trying estradiol for that. Started about 2 months ago.

## 2015-07-30 NOTE — Telephone Encounter (Signed)
Much thanks [place]!  I will put the order in for 3 months

## 2015-07-30 NOTE — Addendum Note (Signed)
Addended by: [name] on: 07/30/2015 10:30 AM   Modules accepted: Orders

## 2015-08-13 ENCOUNTER — Ambulatory Visit: Payer: BLUE CROSS/BLUE SHIELD | Admitting: Gynecologic Oncology

## 2015-09-15 ENCOUNTER — Other Ambulatory Visit: Payer: Self-pay | Admitting: Unknown Physician Specialty

## 2015-12-28 ENCOUNTER — Telehealth: Payer: Self-pay | Admitting: Internal Medicine

## 2015-12-28 NOTE — Telephone Encounter (Signed)
Patient does not wish to schedule a fu at this time.  Patient says her insurance does not pay until she meets her deductible for out of pocket expenses and it would cost her about $300 to come for a visit.  Patient states she is doing well and will call us if needed but for right now she would like to get other medical bills taken care of fist.  Deleting recall.

## 2016-01-17 DIAGNOSIS — Z23 Encounter for immunization: Secondary | ICD-10-CM | POA: Diagnosis not present

## 2016-01-31 ENCOUNTER — Other Ambulatory Visit: Payer: Self-pay | Admitting: Nurse Practitioner

## 2016-01-31 DIAGNOSIS — I428 Other cardiomyopathies: Secondary | ICD-10-CM

## 2016-02-01 ENCOUNTER — Ambulatory Visit (INDEPENDENT_AMBULATORY_CARE_PROVIDER_SITE_OTHER): Payer: BLUE CROSS/BLUE SHIELD | Admitting: Unknown Physician Specialty

## 2016-02-01 ENCOUNTER — Encounter: Payer: Self-pay | Admitting: Unknown Physician Specialty

## 2016-02-01 VITALS — BP 111/78 | HR 69 | Temp 98.0°F | Ht 64.5 in | Wt 182.0 lb

## 2016-02-01 DIAGNOSIS — Z0000 Encounter for general adult medical examination without abnormal findings: Secondary | ICD-10-CM | POA: Diagnosis not present

## 2016-02-01 DIAGNOSIS — Z8543 Personal history of malignant neoplasm of ovary: Secondary | ICD-10-CM

## 2016-02-01 DIAGNOSIS — I10 Essential (primary) hypertension: Secondary | ICD-10-CM

## 2016-02-01 DIAGNOSIS — E7800 Pure hypercholesterolemia, unspecified: Secondary | ICD-10-CM

## 2016-02-01 DIAGNOSIS — E039 Hypothyroidism, unspecified: Secondary | ICD-10-CM | POA: Diagnosis not present

## 2016-02-01 LAB — URINALYSIS
Bilirubin, UA: NEGATIVE
Glucose, UA: NEGATIVE
Ketones, UA: NEGATIVE
Leukocytes, UA: NEGATIVE
Nitrite, UA: NEGATIVE
Protein, UA: NEGATIVE
Specific Gravity, UA: <1.005 — ABNORMAL LOW (ref 1.005–1.030)
Urobilinogen, Ur: 0.2 mg/dL (ref 0.2–1.0)
pH, UA: 5.5 (ref 5.0–7.5)

## 2016-02-01 MED ORDER — RANITIDINE HCL 150 MG PO CAPS: 150.0000 mg | ORAL_CAPSULE | Freq: Two times a day (BID) | ORAL | 3 refills | Status: DC | PRN

## 2016-02-01 MED ORDER — CLONAZEPAM 0.5 MG PO TABS: 0.5000 mg | ORAL_TABLET | Freq: Every day | ORAL | 2 refills | Status: DC | PRN

## 2016-02-01 MED ORDER — LEVOTHYROXINE SODIUM 100 MCG PO TABS: 100.0000 ug | ORAL_TABLET | Freq: Every day | ORAL | 3 refills | Status: DC

## 2016-02-01 MED ORDER — BENAZEPRIL HCL 5 MG PO TABS: 5.0000 mg | ORAL_TABLET | Freq: Every day | ORAL | 3 refills | Status: DC

## 2016-02-01 NOTE — Addendum Note (Signed)
Addended by: [name] on: 02/01/2016 08:43 AM   Modules accepted: Orders

## 2016-02-01 NOTE — Assessment & Plan Note (Signed)
Stable, continue present medications.   

## 2016-02-01 NOTE — Progress Notes (Signed)
BP 111/78   Pulse 69   Temp 98 F (36.7 C)   Ht 5' 4.5" (1.638 m)   Wt 182 lb (82.6 kg)   LMP  (LMP Unknown)   SpO2 98%   BMI 30.76 kg/m    Subjective:    Patient ID: [name] [name], female    DOB: 09-28-1955, 60 y.o.   MRN: [id number]  HPI: [name] [name] is a 60 y.o. female  Chief Complaint  Patient presents with  . Annual Exam   Social History   Social History  . Marital status: Married    Spouse name: N/A  . Number of children: N/A  . Years of education: N/A   Occupational History  . Not on file.   Social History Main Topics  . Smoking status: Former Smoker    Quit date: 07/17/2008  . Smokeless tobacco: Never Used     Comment: quit smoking 10yrs ago  . Alcohol use 12.0 oz/week    20 Cans of beer per week     Comment: beer couple a day  . Drug use: No  . Sexual activity: No   Other Topics Concern  . Not on file   Social History Narrative  . No narrative on file   Family History  Problem Relation Age of Onset  . Cancer Father     died of lung cancer  . Lung cancer Father   . Diabetes Brother   . Hyperlipidemia Mother   . Polymyalgia rheumatica Mother   . Arrhythmia Son    Past Medical History:  Diagnosis Date  . Anxiety    takes Klonopin daily as needed  . Arthritis   . Back pain    reason unknown  . Chronic systolic CHF (congestive heart failure) ([place])    a. echo 10/2013: EF 40-45 (apical images 35-40%), diff HK, mild MR, PASP nl; b. echo 12/2014: EF 35-40%, no RWMA, GR1DD, mod TR, PASP nl, small pericardial effusion circum to the heart, no hemo compromise.  . Complete AV block ([place])    a. s/p pacemaker insertion 1996; b. s/p contralateral side [employer] generator change 11/2014.  . Dizziness    occasionally  . GERD (gastroesophageal reflux disease)    takes Zantac daily as needed  . Headache    occasionally  . History of blood clots    in heart-was on Coumadin---this many yrs ago  . History of colon polyps    benign  . Hyperlipidemia    takes Lovastatin daily  . Hypertension    hx of-since hip replacement MD took resident off meds 10/30/14 b/c well controlled  . Hypothyroid    takes Synthroid daily  . Joint pain   . Joint swelling   . NICM (nonischemic cardiomyopathy) ([place])    a. tachycardia induced->EF 40-45% (2010), 35-40% (10/2013), 35-40% Gr1 DD, mod TR (12/2014);  b. 01/2015 Cath: Nl cors, EF 30-35%, glob HK.  . Numbness and tingling    left leg and right arm  . Ovarian cancer ([facility])    ovarian, skin   . 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 Peripheral artery disease ([place])    stent on lt illiac   Past Surgical History:  Procedure Laterality Date  . ABDOMINAL HYSTERECTOMY    . ANGIOPLASTY / STENTING ILIAC    . CARDIAC CATHETERIZATION N/A 02/08/2015   Procedure: Left Heart Cath and Coronary Angiography;  Surgeon: Wellington Hampshire, MD;  Location: [facility];  Service: Cardiovascular;  Laterality: N/A;  . CERVICAL CERCLAGE  109yrs ago  . COLONOSCOPY    . DG BARIUM SWALLOW ([facility])    . DILATION AND CURETTAGE OF UTERUS    . EP IMPLANTABLE DEVICE Left 12/06/2014   Procedure: Implantation of cardiac resynchronization therapy pacemaker;  Surgeon: Evans Lance, MD;  Location: [facility];  Service: Cardiovascular;  Laterality: Left;  . HERNIA MESH REMOVAL Right    incisional  . HERNIA REPAIR    . ILIAC ARTERY STENT  Apr 05, 2010   Left artery stenting  . INSERT / REPLACE / REMOVE PACEMAKER    . JOINT REPLACEMENT  October 31, 2014   hip  . KNEE SURGERY Right   . PACEMAKER INSERTION  2012   Had replaced in July 2012, first placed in 1996  . PACEMAKER LEAD REMOVAL Right 12/06/2014   Procedure: REMOVAL OF RV AND RA PACEMAKER LEADS;  Surgeon: Evans Lance, MD;  Location: [facility];  Service: Cardiovascular;  Laterality: Right;  DR. BARTLE TO BACK UP   . PACEMAKER PLACEMENT Left 12/06/14  . TOTAL ABDOMINAL HYSTERECTOMY W/ BILATERAL  SALPINGOOPHORECTOMY  07/2008   Ovarian cancer  . TOTAL HIP ARTHROPLASTY Right 10/31/2014   Procedure: TOTAL HIP ARTHROPLASTY ANTERIOR APPROACH;  Surgeon: Hessie Knows, MD;  Location: [facility];  Service: Orthopedics;  Laterality: Right;   Would like her CA 125 today  Hypothyroid Pt states her weight is stable and has a lot of energy.    Hypertension Using medications without difficulty Average home BPs Not checking  No problems or lightheadedness No chest pain with exertion or shortness of breath No Edema   Hyperlipidemia Using medications without problems: No Muscle aches  Diet compliance: Good with diet.   Exercise: 3 times/day  Anxiety Takes Clonazepam occasionally at night    Relevant past medical, surgical, family and social history reviewed and updated as indicated. Interim medical history since our last visit reviewed. Allergies and medications reviewed and updated.  Review of Systems  Per HPI unless specifically indicated above     Objective:    BP 111/78   Pulse 69   Temp 98 F (36.7 C)   Ht 5' 4.5" (1.638 m)   Wt 182 lb (82.6 kg)   LMP  (LMP Unknown)   SpO2 98%   BMI 30.76 kg/m   Wt Readings from Last 3 Encounters:  02/01/16 182 lb (82.6 kg)  07/18/15 176 lb (79.8 kg)  03/20/15 179 lb 8 oz (81.4 kg)    Physical Exam  Constitutional: She is oriented to person, place, and time. She appears well-developed and well-nourished.  HENT:  Head: Normocephalic and atraumatic.  Eyes: Pupils are equal, round, and reactive to light. Right eye exhibits no discharge. Left eye exhibits no discharge. No scleral icterus.  Neck: Normal range of motion. Neck supple. Carotid bruit is not present. No thyromegaly present.  Cardiovascular: Normal rate, regular rhythm and normal heart sounds.  Exam reveals no gallop and no friction rub.   No murmur heard. Pulmonary/Chest: Effort normal and breath sounds normal. No respiratory distress. She has no wheezes. She has no rales.    Abdominal: Soft. Bowel sounds are normal. There is no tenderness. There is no rebound.  Genitourinary: No breast swelling, tenderness or discharge.  Musculoskeletal: Normal range of motion.  Lymphadenopathy:    She has no cervical adenopathy.  Neurological: She is alert and oriented to person, place, and time.  Skin: Skin  is warm, dry and intact. No rash noted.  Psychiatric: She has a normal mood and affect. Her speech is normal and behavior is normal. Judgment and thought content normal. Cognition and memory are normal.    Results for orders placed or performed in visit on 07/18/15  Comprehensive metabolic panel  Result Value Ref Range   Glucose 97 65 - 99 mg/dL   BUN 13 6 - 24 mg/dL   Creatinine, Ser 0.92 0.57 - 1.00 mg/dL   GFR calc non Af Amer 69 >59 mL/min/1.73   GFR calc Af Amer 80 >59 mL/min/1.73   BUN/Creatinine Ratio 14 9 - 23   Sodium 134 134 - 144 mmol/L   Potassium 5.2 3.5 - 5.2 mmol/L   Chloride 92 (L) 96 - 106 mmol/L   CO2 27 18 - 29 mmol/L   Calcium 9.9 8.7 - 10.2 mg/dL   Total Protein 7.4 6.0 - 8.5 g/dL   Albumin 4.5 3.5 - 5.5 g/dL   Globulin, Total 2.9 1.5 - 4.5 g/dL   Albumin/Globulin Ratio 1.6 1.2 - 2.2   Bilirubin Total 0.5 0.0 - 1.2 mg/dL   Alkaline Phosphatase 125 (H) 39 - 117 IU/L   AST 18 0 - 40 IU/L   ALT 10 0 - 32 IU/L  Lipid Panel w/o Chol/HDL Ratio  Result Value Ref Range   Cholesterol, Total 165 100 - 199 mg/dL   Triglycerides 100 0 - 149 mg/dL   HDL 58 >39 mg/dL   VLDL Cholesterol Cal 20 5 - 40 mg/dL   LDL Calculated 87 0 - 99 mg/dL  CA 125  Result Value Ref Range   CA 125 16.4 0.0 - 38.1 U/mL      Assessment & Plan:   Problem List Items Addressed This Visit      Unprioritized   Essential hypertension    Stable, continue present medications.        Relevant Medications   benazepril (LOTENSIN) 5 MG tablet   Other Relevant Orders   Comprehensive metabolic panel   CBC with Differential/Platelet   Hyperlipidemia    Check lipid  panel      Relevant Medications   benazepril (LOTENSIN) 5 MG tablet   Other Relevant Orders   Lipid Panel w/o Chol/HDL Ratio   CBC with Differential/Platelet   Hypothyroidism   Relevant Medications   levothyroxine (SYNTHROID, LEVOTHROID) 100 MCG tablet    Other Visit Diagnoses    Routine general medical examination at a health care facility    -  Primary   Relevant Orders   Comprehensive metabolic panel   Lipid Panel w/o Chol/HDL Ratio   TSH   CBC with Differential/Platelet   Annual physical exam           Follow up plan: Return in about 6 months (around 08/01/2016).

## 2016-02-01 NOTE — Assessment & Plan Note (Signed)
Check lipid panel  

## 2016-02-01 NOTE — Addendum Note (Signed)
Addended by: [name] on: 02/01/2016 09:09 AM   Modules accepted: Orders

## 2016-02-02 LAB — COMPREHENSIVE METABOLIC PANEL
ALBUMIN: 4.1 g/dL (ref 3.5–5.5)
ALT: 10 IU/L (ref 0–32)
AST: 15 IU/L (ref 0–40)
Albumin/Globulin Ratio: 1.4 (ref 1.2–2.2)
Alkaline Phosphatase: 107 IU/L (ref 39–117)
BILIRUBIN TOTAL: 0.3 mg/dL (ref 0.0–1.2)
BUN / CREAT RATIO: 17 (ref 9–23)
BUN: 14 mg/dL (ref 6–24)
CO2: 25 mmol/L (ref 18–29)
CREATININE: 0.81 mg/dL (ref 0.57–1.00)
Calcium: 9.5 mg/dL (ref 8.7–10.2)
Chloride: 98 mmol/L (ref 96–106)
GFR calc non Af Amer: 80 mL/min/{1.73_m2} (ref >59)
GFR, EST AFRICAN AMERICAN: 93 mL/min/{1.73_m2} (ref >59)
GLUCOSE: 109 mg/dL — AB (ref 65–99)
Globulin, Total: 2.9 g/dL (ref 1.5–4.5)
Potassium: 5 mmol/L (ref 3.5–5.2)
Sodium: 138 mmol/L (ref 134–144)
TOTAL PROTEIN: 7 g/dL (ref 6.0–8.5)

## 2016-02-02 LAB — CBC WITH DIFFERENTIAL/PLATELET
Basophils Absolute: 0 10*3/uL (ref 0.0–0.2)
Basos: 1 %
EOS (ABSOLUTE): 0.2 10*3/uL (ref 0.0–0.4)
EOS: 2 %
HEMOGLOBIN: 14.3 g/dL (ref 11.1–15.9)
Hematocrit: 41.2 % (ref 34.0–46.6)
Immature Grans (Abs): 0 10*3/uL (ref 0.0–0.1)
Immature Granulocytes: 0 %
LYMPHS ABS: 1.7 10*3/uL (ref 0.7–3.1)
Lymphs: 23 %
MCH: 31.1 pg (ref 26.6–33.0)
MCHC: 34.7 g/dL (ref 31.5–35.7)
MCV: 90 fL (ref 79–97)
MONOCYTES: 11 %
Monocytes Absolute: 0.8 10*3/uL (ref 0.1–0.9)
NEUTROS ABS: 4.5 10*3/uL (ref 1.4–7.0)
Neutrophils: 63 %
Platelets: 211 10*3/uL (ref 150–379)
RBC: 4.6 x10E6/uL (ref 3.77–5.28)
RDW: 13.5 % (ref 12.3–15.4)
WBC: 7.2 10*3/uL (ref 3.4–10.8)

## 2016-02-02 LAB — LIPID PANEL W/O CHOL/HDL RATIO
Cholesterol, Total: 145 mg/dL (ref 100–199)
HDL: 52 mg/dL (ref >39)
LDL CALC: 79 mg/dL (ref 0–99)
Triglycerides: 71 mg/dL (ref 0–149)
VLDL CHOLESTEROL CAL: 14 mg/dL (ref 5–40)

## 2016-02-02 LAB — CA 125: CA 125: 14.2 U/mL (ref 0.0–38.1)

## 2016-02-02 LAB — TSH: TSH: 3.23 u[IU]/mL (ref 0.450–4.500)

## 2016-02-04 ENCOUNTER — Encounter: Payer: Self-pay | Admitting: Unknown Physician Specialty

## 2016-02-04 ENCOUNTER — Telehealth: Payer: Self-pay | Admitting: Unknown Physician Specialty

## 2016-02-04 NOTE — Telephone Encounter (Signed)
Flu vaccine documented in chart. Will route to provider to discuss labs with patient.

## 2016-02-04 NOTE — Telephone Encounter (Signed)
Pt called to notify she received her flu shot at work on October 5th. Pt also stated if someone needs to call to discuss lab results to please call her work or cell #'s. Thanks.

## 2016-02-04 NOTE — Telephone Encounter (Signed)
I sent a letter this morning with her labs.  Her labs were normal with her blood sugar just a little high and CA 125 came down a couple of points.  Please let her know

## 2016-02-04 NOTE — Telephone Encounter (Signed)
Called and let patient know what Malachy Mood said. Patient stated that she saw results on my chart this morning after calling.

## 2016-02-05 ENCOUNTER — Other Ambulatory Visit: Payer: Self-pay | Admitting: Unknown Physician Specialty

## 2016-02-05 DIAGNOSIS — Z1231 Encounter for screening mammogram for malignant neoplasm of breast: Secondary | ICD-10-CM

## 2016-02-20 ENCOUNTER — Ambulatory Visit
Admission: RE | Admit: 2016-02-20 | Discharge: 2016-02-20 | Disposition: A | Discharge status: Home / Self Care | Payer: BLUE CROSS/BLUE SHIELD | Source: Ambulatory Visit | Attending: Unknown Physician Specialty | Admitting: Unknown Physician Specialty

## 2016-02-20 DIAGNOSIS — Z1231 Encounter for screening mammogram for malignant neoplasm of breast: Secondary | ICD-10-CM | POA: Diagnosis not present

## 2016-03-19 ENCOUNTER — Other Ambulatory Visit: Payer: Self-pay | Admitting: Physician Assistant

## 2016-03-19 NOTE — Telephone Encounter (Signed)
Spoke with pt to ask about a follow-up after receiving refill request. Pt last seen 03/2015 and needed a 55month f/u per Dr. Caryl Comes on last ov note. Pt refuses to follow-up and states she can't afford It, and told Dr. Caryl Comes she would follow-up as needed at her last visit. Pt states she will have Dr. Julian Hy refill her meds.

## 2016-04-15 ENCOUNTER — Telehealth: Payer: Self-pay | Admitting: Unknown Physician Specialty

## 2016-04-22 NOTE — Telephone Encounter (Signed)
ERROR

## 2016-05-05 ENCOUNTER — Other Ambulatory Visit: Payer: Self-pay | Admitting: Nurse Practitioner

## 2016-05-05 DIAGNOSIS — I428 Other cardiomyopathies: Secondary | ICD-10-CM

## 2016-05-12 ENCOUNTER — Other Ambulatory Visit: Payer: Self-pay | Admitting: Unknown Physician Specialty

## 2016-05-12 DIAGNOSIS — I428 Other cardiomyopathies: Secondary | ICD-10-CM

## 2016-05-12 MED ORDER — CLONAZEPAM 0.5 MG PO TABS: 0.5000 mg | ORAL_TABLET | Freq: Every day | ORAL | 2 refills | Status: DC | PRN

## 2016-05-12 MED ORDER — DIGOXIN 125 MCG PO TABS: 0.1250 mg | ORAL_TABLET | Freq: Every day | ORAL | 0 refills | Status: DC

## 2016-05-12 NOTE — Telephone Encounter (Signed)
Routing to provider  

## 2016-07-07 ENCOUNTER — Other Ambulatory Visit: Payer: Self-pay | Admitting: Unknown Physician Specialty

## 2016-07-09 IMAGING — CR DG HIP (WITH PELVIS) OPERATIVE*R*
1 series · 1 of 1 positions shown · non-contrast
Comparison: Abdominal pelvic CT scan October 20, 2013.

CLINICAL DATA: History of right total hip joint replacement

EXAM:
OPERATIVE right HIP (WITH PELVIS IF PERFORMED)  VIEWS
TECHNIQUE: Fluoroscopic spot image was submitted for interpretation
post-operatively.

[[person_name]]
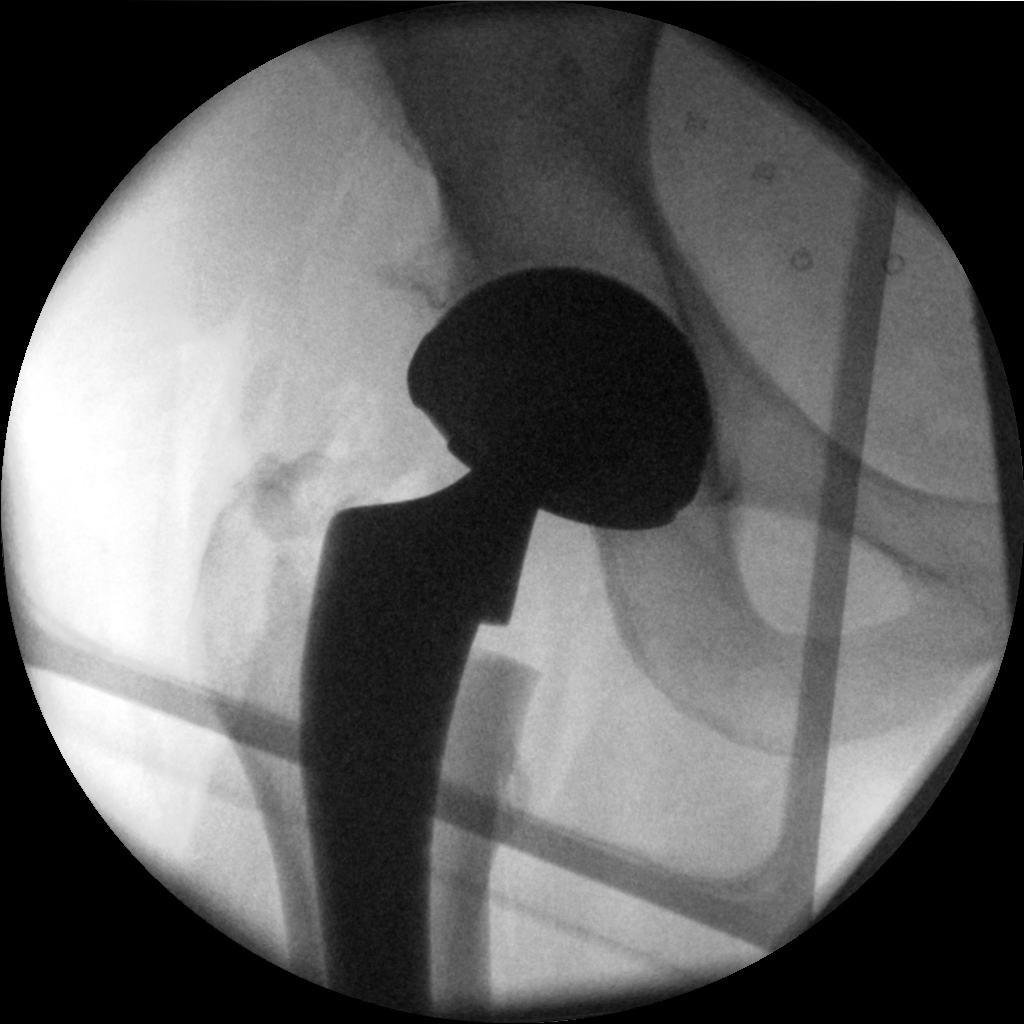

[1 of 1 positions shown; findings below may reference images not displayed]

FINDINGS: A single fluoro spot film is submitted. Fluoro time was reported at
36 seconds. A right hip prosthesis is in place. Radiographic
positioning of the prosthetic components is good where visualized.
The native bone exhibits no acute abnormality.
IMPRESSION: Single fluoro spot image revealing interval placement of a
prosthetic right hip joint without evidence of immediate
postprocedure complication. The inferior portion of the femoral
component of the prosthesis is not included in the field of view.

## 2016-07-09 IMAGING — CR DG HIP (WITH OR WITHOUT PELVIS) 2-3V*R*
1 series · 2 of 2 positions shown · non-contrast
Comparison: None.

CLINICAL DATA: Status post total hip replacement

EXAM:
DG HIP 2-3V RIGHT

[Series 1: lat · 0.17mm/px · 2 of 2 slices shown]
[im 1/2]
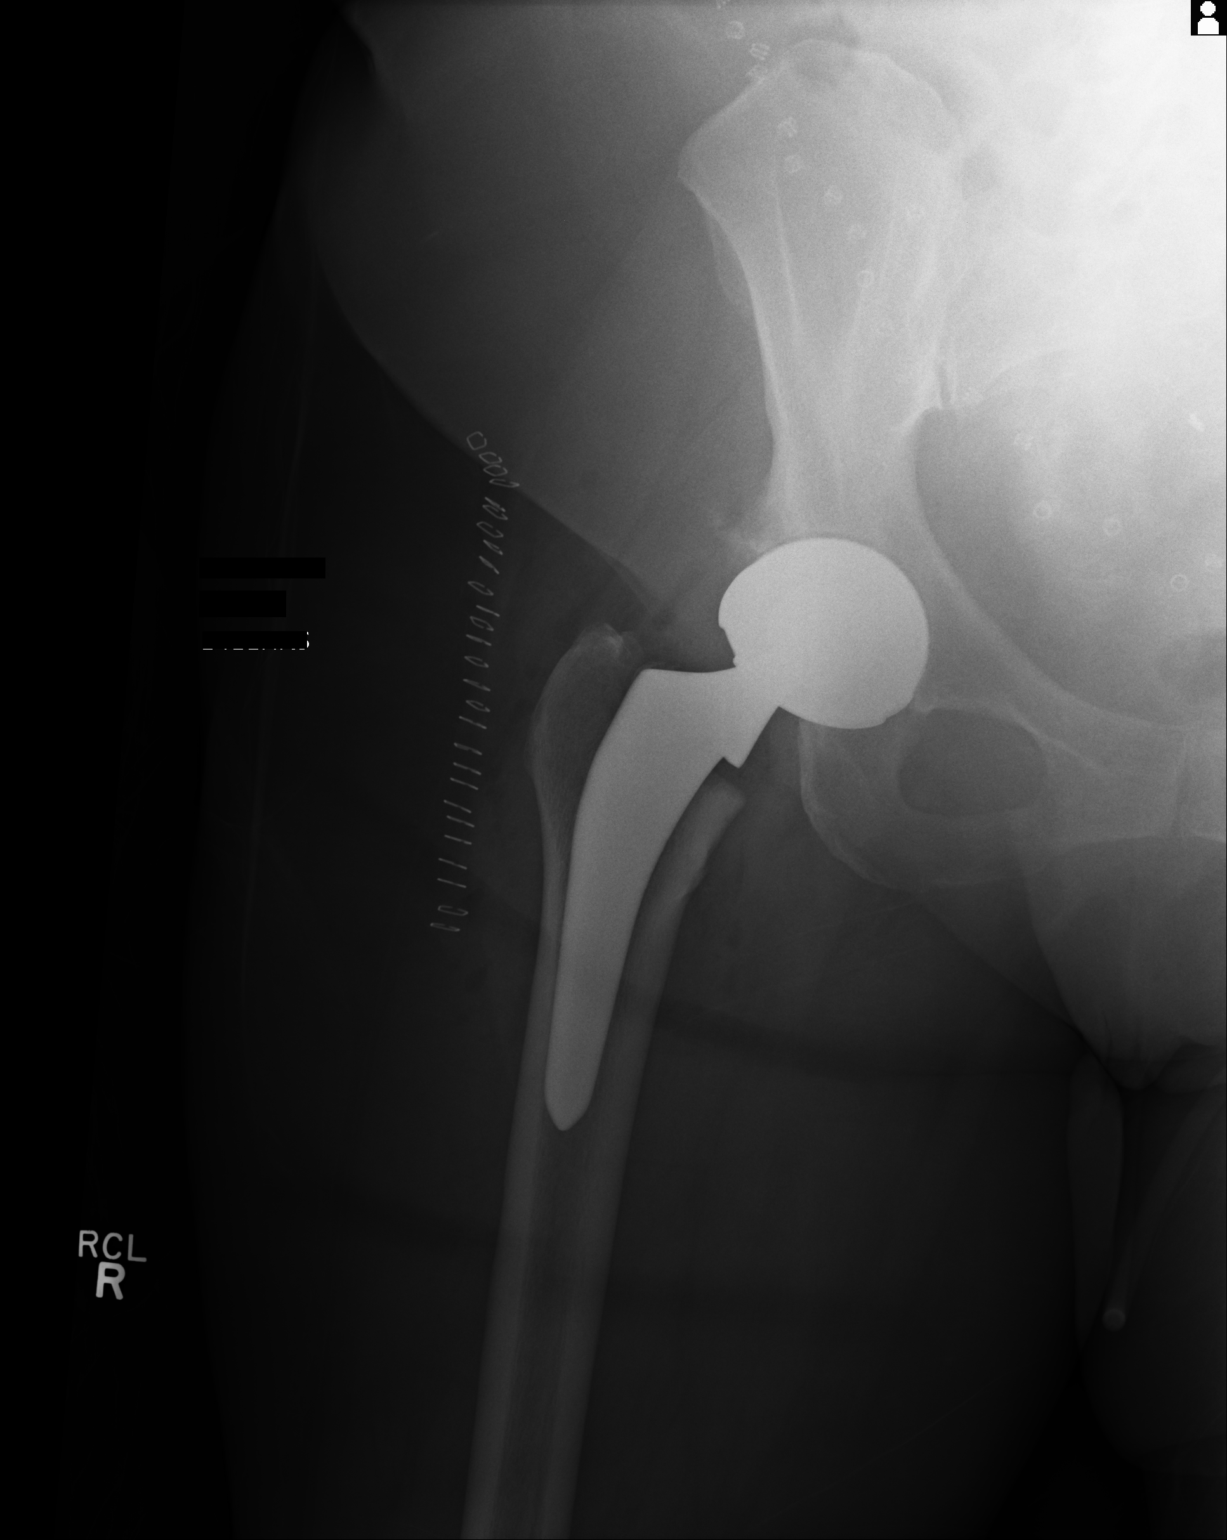
[im 2/2]
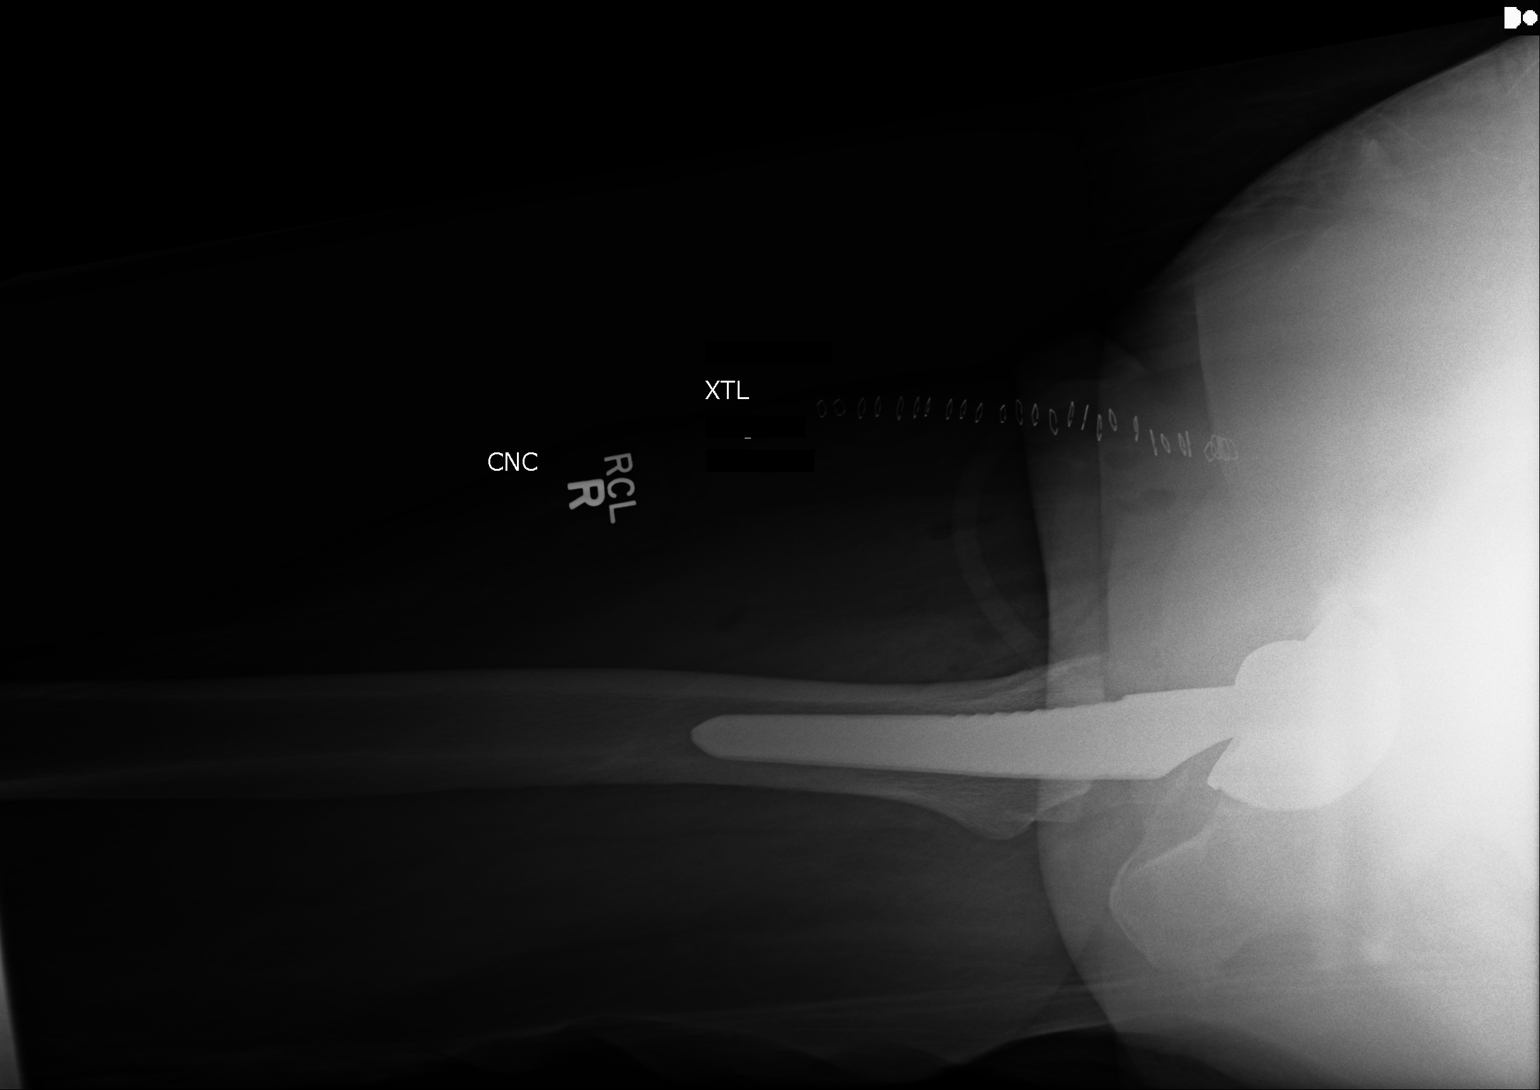

[2 of 2 positions shown; findings below may reference images not displayed]

FINDINGS: Frontal and lateral views of the right hip were obtained. There is a
total hip prosthesis on the right which appears well seated. No
acute fracture or dislocation. No erosive change.
IMPRESSION: Right total hip prosthesis appears well-seated. No acute fracture or
dislocation.

## 2016-07-25 ENCOUNTER — Encounter: Payer: Self-pay | Admitting: Unknown Physician Specialty

## 2016-07-25 ENCOUNTER — Ambulatory Visit (INDEPENDENT_AMBULATORY_CARE_PROVIDER_SITE_OTHER): Payer: BLUE CROSS/BLUE SHIELD | Admitting: Unknown Physician Specialty

## 2016-07-25 DIAGNOSIS — E039 Hypothyroidism, unspecified: Secondary | ICD-10-CM

## 2016-07-25 DIAGNOSIS — E7800 Pure hypercholesterolemia, unspecified: Secondary | ICD-10-CM

## 2016-07-25 DIAGNOSIS — I428 Other cardiomyopathies: Secondary | ICD-10-CM | POA: Diagnosis not present

## 2016-07-25 DIAGNOSIS — F5101 Primary insomnia: Secondary | ICD-10-CM

## 2016-07-25 MED ORDER — RANITIDINE HCL 150 MG PO CAPS: 150.0000 mg | ORAL_CAPSULE | Freq: Two times a day (BID) | ORAL | 3 refills | Status: DC | PRN

## 2016-07-25 MED ORDER — DIGOXIN 125 MCG PO TABS: 0.1250 mg | ORAL_TABLET | Freq: Every day | ORAL | 0 refills | Status: DC

## 2016-07-25 MED ORDER — BENAZEPRIL HCL 5 MG PO TABS: 5.0000 mg | ORAL_TABLET | Freq: Every day | ORAL | 3 refills | Status: DC

## 2016-07-25 MED ORDER — LOVASTATIN 20 MG PO TABS: 20.0000 mg | ORAL_TABLET | Freq: Every day | ORAL | 3 refills | Status: DC

## 2016-07-25 MED ORDER — LEVOTHYROXINE SODIUM 100 MCG PO TABS: 100.0000 ug | ORAL_TABLET | Freq: Every day | ORAL | 3 refills | Status: DC

## 2016-07-25 NOTE — Assessment & Plan Note (Signed)
Stable, continue present medications.   

## 2016-07-25 NOTE — Progress Notes (Signed)
BP 115/77 (BP Location: Left Arm, Patient Position: Sitting, Cuff Size: Large)   Pulse 64   Temp 98 F (36.7 C)   Ht 5' 5.2" (1.656 m) Comment: pt had shoes on  Wt 181 lb 6.4 oz (82.3 kg) Comment: pt had shoes on  LMP  (LMP Unknown)   SpO2 98%   BMI 30.00 kg/m    Subjective:    Patient ID: [name] [name], female    DOB: [birth year], 61 y.o.   MRN: [id number]  HPI: [name] [name] is a 61 y.o. female  Chief Complaint  Patient presents with  . Anxiety  . Hyperlipidemia  . Hypertension  . Hypothyroidism   Hypothyroid Energy level is good and weight stable.     Hypertension Using medications without difficulty Average home BPs : Not checking    No problems or lightheadedness No chest pain with exertion or shortness of breath No Edema  Hyperlipidemia Using medications without problems: No Muscle aches  Diet compliance:Eats well   Exercise: Lots of walking  Insomnia Takes Clonazepam occasionally at night.  Not even once or twice a week.   Depression screen [facility] 2/9 07/25/2016 01/17/2015  Decreased Interest 0 0  Down, Depressed, Hopeless 0 0  PHQ - 2 Score 0 0  Altered sleeping 1 -  Tired, decreased energy 0 -  Change in appetite 0 -  Feeling bad or failure about yourself  0 -  Trouble concentrating 0 -  Moving slowly or fidgety/restless 0 -  Suicidal thoughts 0 -  PHQ-9 Score 1 -   Relevant past medical, surgical, family and social history reviewed and updated as indicated. Interim medical history since our last visit reviewed. Allergies and medications reviewed and updated.  Review of Systems  Per HPI unless specifically indicated above     Objective:    BP 115/77 (BP Location: Left Arm, Patient Position: Sitting, Cuff Size: Large)   Pulse 64   Temp 98 F (36.7 C)   Ht 5' 5.2" (1.656 m) Comment: pt had shoes on  Wt 181 lb 6.4 oz (82.3 kg) Comment: pt had shoes on  LMP  (LMP Unknown)   SpO2 98%   BMI 30.00 kg/m   Wt Readings from Last 3 Encounters:    07/25/16 181 lb 6.4 oz (82.3 kg)  02/01/16 182 lb (82.6 kg)  07/18/15 176 lb (79.8 kg)    Physical Exam  Constitutional: She is oriented to person, place, and time. She appears well-developed and well-nourished. No distress.  HENT:  Head: Normocephalic and atraumatic.  Eyes: Conjunctivae and lids are normal. Right eye exhibits no discharge. Left eye exhibits no discharge. No scleral icterus.  Neck: Normal range of motion. Neck supple. No JVD present. Carotid bruit is not present.  Cardiovascular: Normal rate, regular rhythm, S1 normal and S2 normal.  Exam reveals gallop and S4.   Pulmonary/Chest: Effort normal and breath sounds normal.  Abdominal: Normal appearance. There is no splenomegaly or hepatomegaly.  Musculoskeletal: Normal range of motion.  Neurological: She is alert and oriented to person, place, and time.  Skin: Skin is warm, dry and intact. No rash noted. No pallor.  Psychiatric: She has a normal mood and affect. Her behavior is normal. Judgment and thought content normal.    Results for orders placed or performed in visit on 02/01/16  Comprehensive metabolic panel  Result Value Ref Range   Glucose 109 (H) 65 - 99 mg/dL   BUN 14 6 - 24 mg/dL   Creatinine,  Ser 0.81 0.57 - 1.00 mg/dL   GFR calc non Af Amer 80 >59 mL/min/1.73   GFR calc Af Amer 93 >59 mL/min/1.73   BUN/Creatinine Ratio 17 9 - 23   Sodium 138 134 - 144 mmol/L   Potassium 5.0 3.5 - 5.2 mmol/L   Chloride 98 96 - 106 mmol/L   CO2 25 18 - 29 mmol/L   Calcium 9.5 8.7 - 10.2 mg/dL   Total Protein 7.0 6.0 - 8.5 g/dL   Albumin 4.1 3.5 - 5.5 g/dL   Globulin, Total 2.9 1.5 - 4.5 g/dL   Albumin/Globulin Ratio 1.4 1.2 - 2.2   Bilirubin Total 0.3 0.0 - 1.2 mg/dL   Alkaline Phosphatase 107 39 - 117 IU/L   AST 15 0 - 40 IU/L   ALT 10 0 - 32 IU/L  Lipid Panel w/o Chol/HDL Ratio  Result Value Ref Range   Cholesterol, Total 145 100 - 199 mg/dL   Triglycerides 71 0 - 149 mg/dL   HDL 52 >39 mg/dL   VLDL  Cholesterol Cal 14 5 - 40 mg/dL   LDL Calculated 79 0 - 99 mg/dL  TSH  Result Value Ref Range   TSH 3.230 0.450 - 4.500 uIU/mL  CBC with Differential/Platelet  Result Value Ref Range   WBC 7.2 3.4 - 10.8 x10E3/uL   RBC 4.60 3.77 - 5.28 x10E6/uL   Hemoglobin 14.3 11.1 - 15.9 g/dL   Hematocrit 41.2 34.0 - 46.6 %   MCV 90 79 - 97 fL   MCH 31.1 26.6 - 33.0 pg   MCHC 34.7 31.5 - 35.7 g/dL   RDW 13.5 12.3 - 15.4 %   Platelets 211 150 - 379 x10E3/uL   Neutrophils 63 Not Estab. %   Lymphs 23 Not Estab. %   Monocytes 11 Not Estab. %   Eos 2 Not Estab. %   Basos 1 Not Estab. %   Neutrophils Absolute 4.5 1.4 - 7.0 x10E3/uL   Lymphocytes Absolute 1.7 0.7 - 3.1 x10E3/uL   Monocytes Absolute 0.8 0.1 - 0.9 x10E3/uL   EOS (ABSOLUTE) 0.2 0.0 - 0.4 x10E3/uL   Basophils Absolute 0.0 0.0 - 0.2 x10E3/uL   Immature Granulocytes 0 Not Estab. %   Immature Grans (Abs) 0.0 0.0 - 0.1 x10E3/uL  Urinalysis  Result Value Ref Range   Specific Gravity, UA <1.005 (L) 1.005 - 1.030   pH, UA 5.5 5.0 - 7.5   Color, UA Yellow Yellow   Appearance Ur Clear Clear   Leukocytes, UA Negative Negative   Protein, UA Negative Negative/Trace   Glucose, UA Negative Negative   Ketones, UA Negative Negative   RBC, UA Trace (A) Negative   Bilirubin, UA Negative Negative   Urobilinogen, Ur 0.2 0.2 - 1.0 mg/dL   Nitrite, UA Negative Negative  CA 125  Result Value Ref Range   CA 125 14.2 0.0 - 38.1 U/mL   Reviewed labs from last visit and were normal    Assessment & Plan:   Problem List Items Addressed This Visit      Unprioritized   Hyperlipidemia    Stable, continue present medications.        Relevant Medications   benazepril (LOTENSIN) 5 MG tablet   lovastatin (MEVACOR) 20 MG tablet   digoxin (LANOXIN) 0.125 MG tablet   Hypothyroidism    Stable, continue present medications.        Relevant Medications   levothyroxine (SYNTHROID, LEVOTHROID) 100 MCG tablet   Insomnia    Stable, continue  present  medications.        NICM (nonischemic cardiomyopathy) ([facility])    Stable, continue present medications.        Relevant Medications   benazepril (LOTENSIN) 5 MG tablet   lovastatin (MEVACOR) 20 MG tablet   digoxin (LANOXIN) 0.125 MG tablet      Recheck in 6 months.  Be sure to add dig level to those medications.    Follow up plan: Return in about 6 months (around 01/24/2017) for physical.

## 2016-08-15 IMAGING — CR DG CHEST 2V
2 series · 2 of 2 positions shown · non-contrast
Comparison: 12/06/2014.

CLINICAL DATA: Cardiac pacer.

EXAM:
CHEST  2 VIEW

[chest lat]
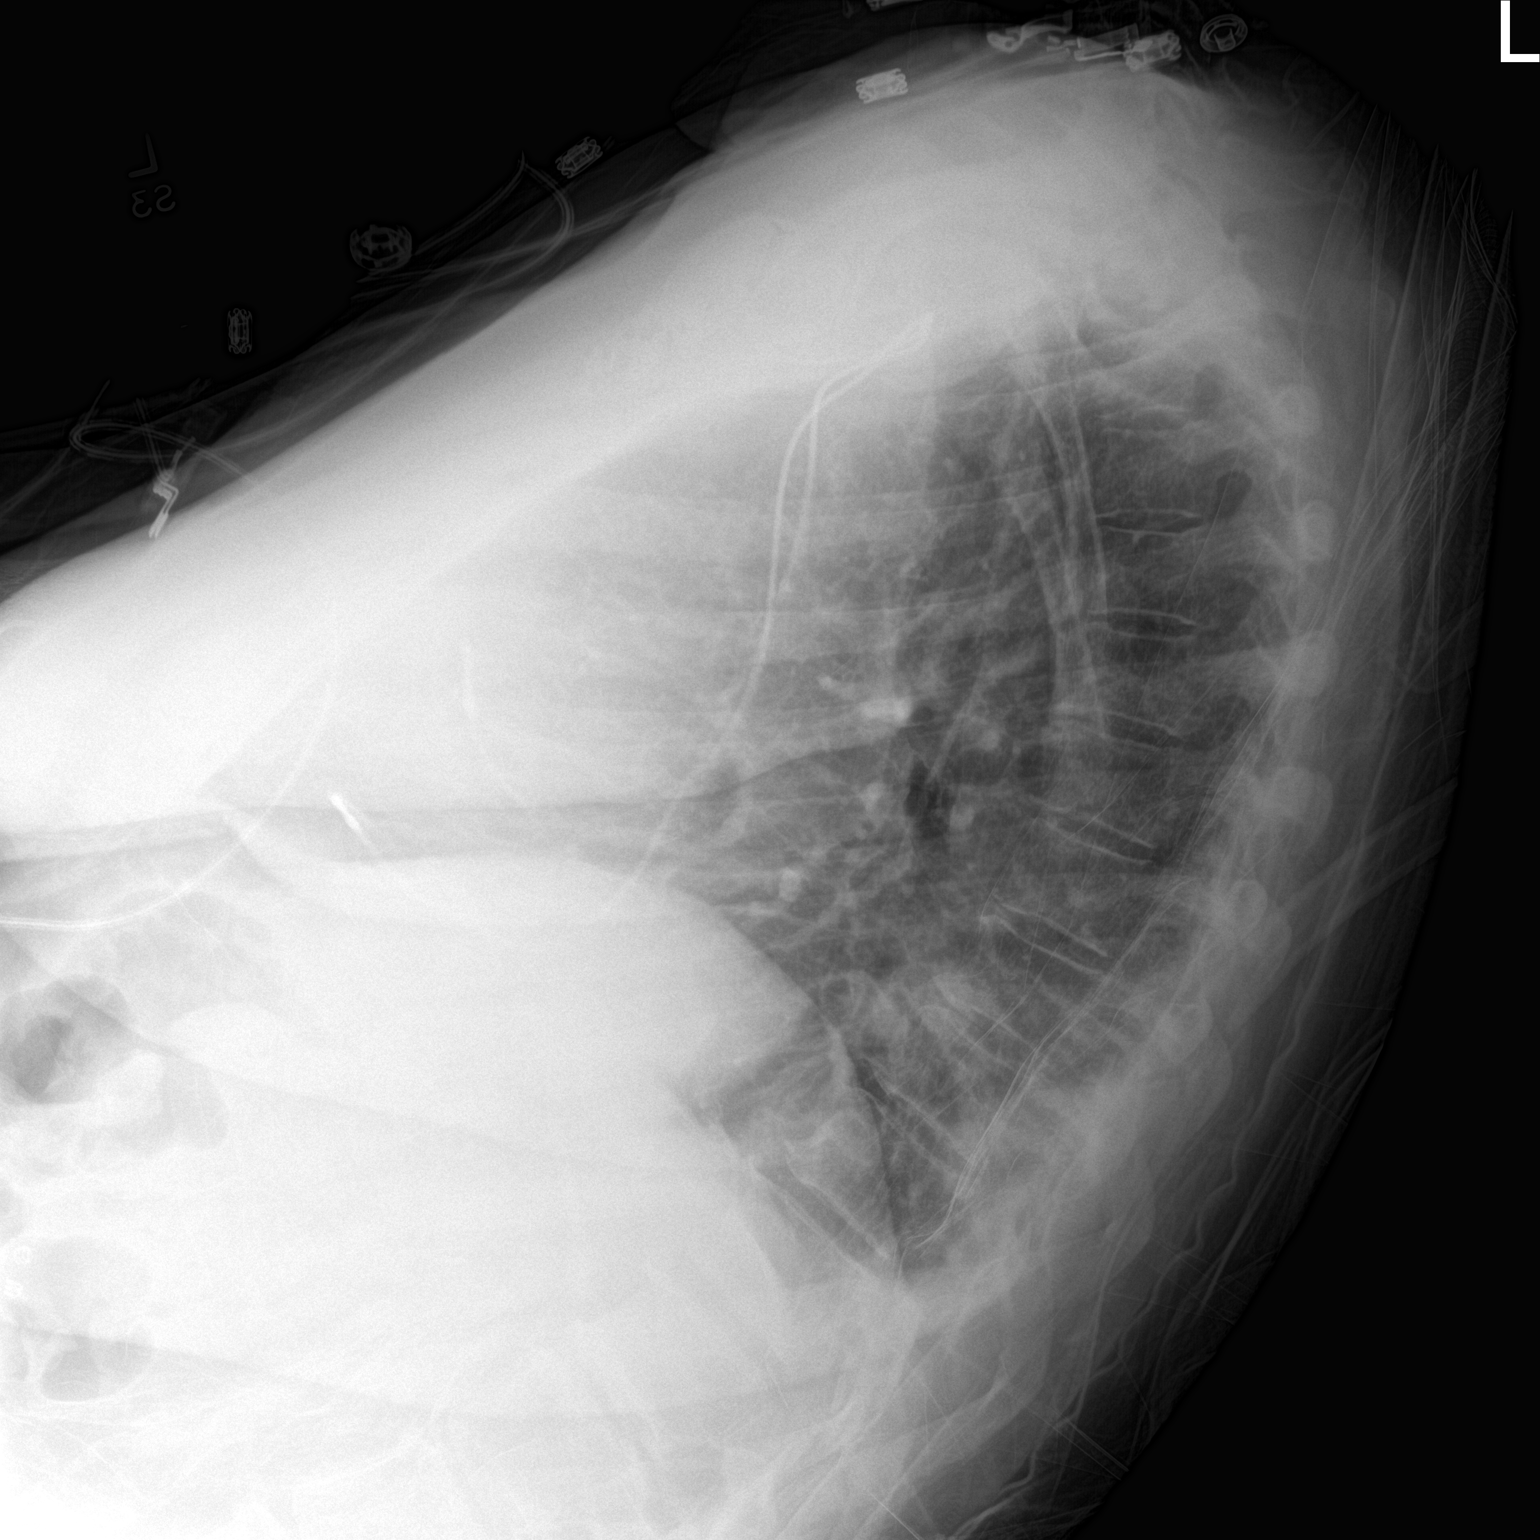

[chest ap]
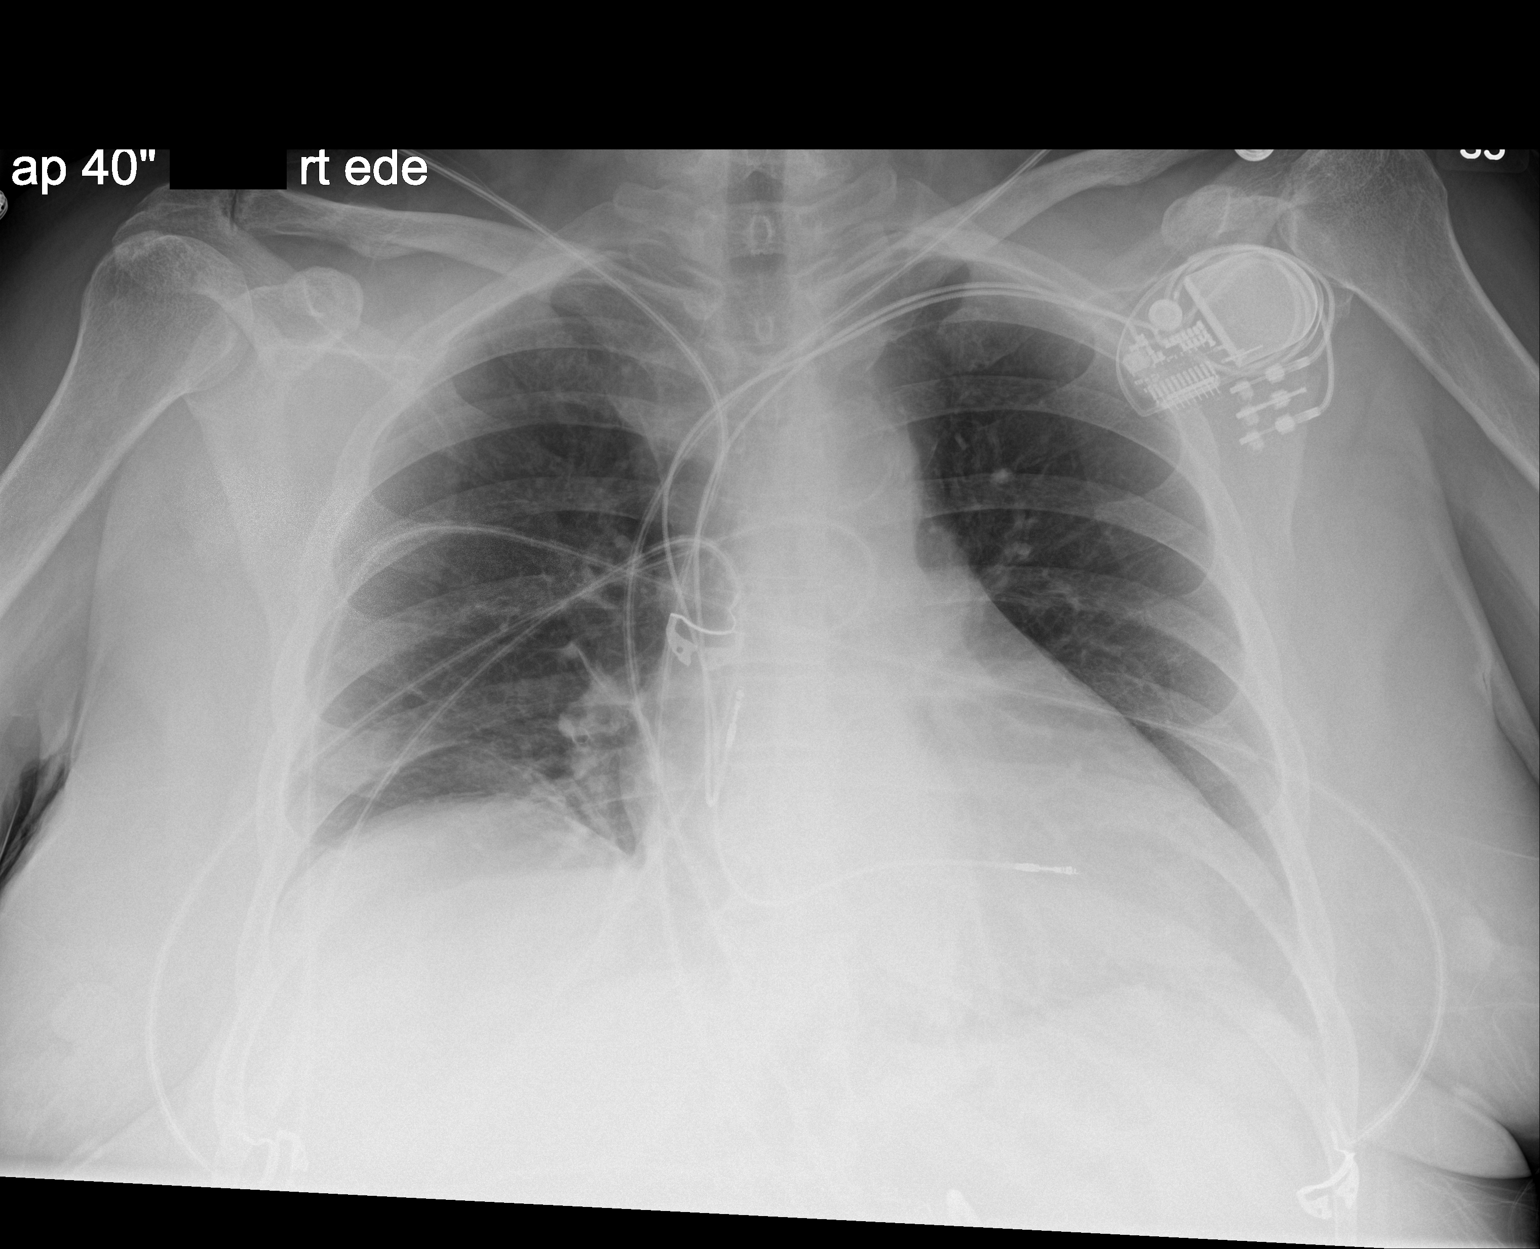

[2 of 2 positions shown; findings below may reference images not displayed]

FINDINGS: Interim extubation. Cardiac pacer with lead tips in right atrium
right ventricle. Stable heart size. Bibasilar subsegmental
atelectasis and/or infiltrates. No pleural effusion or pneumothorax.
IMPRESSION: 1. Interim extubation.
2. Cardiac pacer stable position. Mild cardiomegaly, no pulmonary
venous congestion.
3. Mild bibasilar subsegmental atelectasis.

## 2016-11-10 ENCOUNTER — Other Ambulatory Visit: Payer: Self-pay | Admitting: Unknown Physician Specialty

## 2016-11-10 DIAGNOSIS — I428 Other cardiomyopathies: Secondary | ICD-10-CM

## 2016-12-19 DIAGNOSIS — H524 Presbyopia: Secondary | ICD-10-CM | POA: Diagnosis not present

## 2017-01-15 DIAGNOSIS — Z23 Encounter for immunization: Secondary | ICD-10-CM | POA: Diagnosis not present

## 2017-01-19 DIAGNOSIS — D2261 Melanocytic nevi of right upper limb, including shoulder: Secondary | ICD-10-CM | POA: Diagnosis not present

## 2017-01-28 ENCOUNTER — Other Ambulatory Visit: Payer: Self-pay | Admitting: Unknown Physician Specialty

## 2017-01-28 DIAGNOSIS — I428 Other cardiomyopathies: Secondary | ICD-10-CM

## 2017-02-12 ENCOUNTER — Other Ambulatory Visit: Payer: Self-pay | Admitting: Unknown Physician Specialty

## 2017-02-12 DIAGNOSIS — Z1231 Encounter for screening mammogram for malignant neoplasm of breast: Secondary | ICD-10-CM

## 2017-02-13 ENCOUNTER — Ambulatory Visit (INDEPENDENT_AMBULATORY_CARE_PROVIDER_SITE_OTHER): Payer: BLUE CROSS/BLUE SHIELD | Admitting: Unknown Physician Specialty

## 2017-02-13 ENCOUNTER — Encounter: Payer: Self-pay | Admitting: Unknown Physician Specialty

## 2017-02-13 VITALS — BP 110/71 | HR 67 | Temp 97.7°F | Ht 64.7 in | Wt 173.8 lb

## 2017-02-13 DIAGNOSIS — Z0000 Encounter for general adult medical examination without abnormal findings: Secondary | ICD-10-CM

## 2017-02-13 DIAGNOSIS — I5022 Chronic systolic (congestive) heart failure: Secondary | ICD-10-CM

## 2017-02-13 DIAGNOSIS — E7800 Pure hypercholesterolemia, unspecified: Secondary | ICD-10-CM | POA: Diagnosis not present

## 2017-02-13 DIAGNOSIS — E039 Hypothyroidism, unspecified: Secondary | ICD-10-CM

## 2017-02-13 DIAGNOSIS — Z8543 Personal history of malignant neoplasm of ovary: Secondary | ICD-10-CM

## 2017-02-13 DIAGNOSIS — I10 Essential (primary) hypertension: Secondary | ICD-10-CM

## 2017-02-13 DIAGNOSIS — F5101 Primary insomnia: Secondary | ICD-10-CM

## 2017-02-13 DIAGNOSIS — M1A9XX0 Chronic gout, unspecified, without tophus (tophi): Secondary | ICD-10-CM

## 2017-02-13 NOTE — Assessment & Plan Note (Signed)
Check TSH 

## 2017-02-13 NOTE — Assessment & Plan Note (Signed)
Folowing with cardiology.  Refill Digoxin

## 2017-02-13 NOTE — Assessment & Plan Note (Signed)
Check CA 125 

## 2017-02-13 NOTE — Assessment & Plan Note (Signed)
Stable, continue present medications.   

## 2017-02-13 NOTE — Progress Notes (Signed)
BP 110/71   Pulse 67   Temp 97.7 F (36.5 C)   Ht 5' 4.7" (1.643 m)   Wt 173 lb 12.8 oz (78.8 kg)   LMP  (LMP Unknown)   SpO2 97%   BMI 29.19 kg/m    Subjective:    Patient ID: Lenell D Gillen, female    DOB: [birth year], 61 y.o.   MRN: [id number]  HPI: Quetzally D Stuck is a 61 y.o. female  Chief Complaint  Patient presents with  . Annual Exam   Gout Left toe having flares, about 5.  Drinking vinegar and keeping it under control.  Wonders about Uric acid levels.  Does not want medication  Insomnia Rare clonazepam use.   Depression screen [facility] 2/9 02/13/2017 07/25/2016 01/17/2015  Decreased Interest 0 0 0  Down, Depressed, Hopeless 0 0 0  PHQ - 2 Score 0 0 0  Altered sleeping 1 1 -  Tired, decreased energy 0 0 -  Change in appetite 0 0 -  Feeling bad or failure about yourself  0 0 -  Trouble concentrating 0 0 -  Moving slowly or fidgety/restless 0 0 -  Suicidal thoughts 0 0 -  PHQ-9 Score 1 1 -     Hypertension Using medications without difficulty Average home BPs not checking  No problems or lightheadedness No chest pain with exertion or shortness of breath No Edema  Hyperlipidemia Using medications without problems: No Muscle aches  Diet compliance:Exercise:Walking daily.  Eating well    Relevant past medical, surgical, family and social history reviewed and updated as indicated. Interim medical history since our last visit reviewed. Allergies and medications reviewed and updated.  Review of Systems  Per HPI unless specifically indicated above     Objective:    BP 110/71   Pulse 67   Temp 97.7 F (36.5 C)   Ht 5' 4.7" (1.643 m)   Wt 173 lb 12.8 oz (78.8 kg)   LMP  (LMP Unknown)   SpO2 97%   BMI 29.19 kg/m   Wt Readings from Last 3 Encounters:  02/13/17 173 lb 12.8 oz (78.8 kg)  07/25/16 181 lb 6.4 oz (82.3 kg)  02/01/16 182 lb (82.6 kg)    Physical Exam  Constitutional: She is oriented to person, place, and time. She appears well-developed  and well-nourished.  HENT:  Head: Normocephalic and atraumatic.  Eyes: Pupils are equal, round, and reactive to light. Right eye exhibits no discharge. Left eye exhibits no discharge. No scleral icterus.  Neck: Normal range of motion. Neck supple. Carotid bruit is not present. No thyromegaly present.  Cardiovascular: Normal rate, regular rhythm and normal heart sounds.  Exam reveals no gallop and no friction rub.   No murmur heard. Pulmonary/Chest: Effort normal and breath sounds normal. No respiratory distress. She has no wheezes. She has no rales.  Abdominal: Soft. Bowel sounds are normal. There is no tenderness. There is no rebound.  Genitourinary: No breast swelling, tenderness or discharge.  Musculoskeletal: Normal range of motion.  Lymphadenopathy:    She has no cervical adenopathy.  Neurological: She is alert and oriented to person, place, and time.  Skin: Skin is warm, dry and intact. No rash noted.  Psychiatric: She has a normal mood and affect. Her speech is normal and behavior is normal. Judgment and thought content normal. Cognition and memory are normal.    Results for orders placed or performed in visit on 02/01/16  Comprehensive metabolic panel  Result Value Ref Range  Glucose 109 (H) 65 - 99 mg/dL   BUN 14 6 - 24 mg/dL   Creatinine, Ser 0.81 0.57 - 1.00 mg/dL   GFR calc non Af Amer 80 >59 mL/min/1.73   GFR calc Af Amer 93 >59 mL/min/1.73   BUN/Creatinine Ratio 17 9 - 23   Sodium 138 134 - 144 mmol/L   Potassium 5.0 3.5 - 5.2 mmol/L   Chloride 98 96 - 106 mmol/L   CO2 25 18 - 29 mmol/L   Calcium 9.5 8.7 - 10.2 mg/dL   Total Protein 7.0 6.0 - 8.5 g/dL   Albumin 4.1 3.5 - 5.5 g/dL   Globulin, Total 2.9 1.5 - 4.5 g/dL   Albumin/Globulin Ratio 1.4 1.2 - 2.2   Bilirubin Total 0.3 0.0 - 1.2 mg/dL   Alkaline Phosphatase 107 39 - 117 IU/L   AST 15 0 - 40 IU/L   ALT 10 0 - 32 IU/L  Lipid Panel w/o Chol/HDL Ratio  Result Value Ref Range   Cholesterol, Total 145 100 -  199 mg/dL   Triglycerides 71 0 - 149 mg/dL   HDL 52 >39 mg/dL   VLDL Cholesterol Cal 14 5 - 40 mg/dL   LDL Calculated 79 0 - 99 mg/dL  TSH  Result Value Ref Range   TSH 3.230 0.450 - 4.500 uIU/mL  CBC with Differential/Platelet  Result Value Ref Range   WBC 7.2 3.4 - 10.8 x10E3/uL   RBC 4.60 3.77 - 5.28 x10E6/uL   Hemoglobin 14.3 11.1 - 15.9 g/dL   Hematocrit 41.2 34.0 - 46.6 %   MCV 90 79 - 97 fL   MCH 31.1 26.6 - 33.0 pg   MCHC 34.7 31.5 - 35.7 g/dL   RDW 13.5 12.3 - 15.4 %   Platelets 211 150 - 379 x10E3/uL   Neutrophils 63 Not Estab. %   Lymphs 23 Not Estab. %   Monocytes 11 Not Estab. %   Eos 2 Not Estab. %   Basos 1 Not Estab. %   Neutrophils Absolute 4.5 1.4 - 7.0 x10E3/uL   Lymphocytes Absolute 1.7 0.7 - 3.1 x10E3/uL   Monocytes Absolute 0.8 0.1 - 0.9 x10E3/uL   EOS (ABSOLUTE) 0.2 0.0 - 0.4 x10E3/uL   Basophils Absolute 0.0 0.0 - 0.2 x10E3/uL   Immature Granulocytes 0 Not Estab. %   Immature Grans (Abs) 0.0 0.0 - 0.1 x10E3/uL  Urinalysis  Result Value Ref Range   Specific Gravity, UA <1.005 (L) 1.005 - 1.030   pH, UA 5.5 5.0 - 7.5   Color, UA Yellow Yellow   Appearance Ur Clear Clear   Leukocytes, UA Negative Negative   Protein, UA Negative Negative/Trace   Glucose, UA Negative Negative   Ketones, UA Negative Negative   RBC, UA Trace (A) Negative   Bilirubin, UA Negative Negative   Urobilinogen, Ur 0.2 0.2 - 1.0 mg/dL   Nitrite, UA Negative Negative  CA 125  Result Value Ref Range   CA 125 14.2 0.0 - 38.1 U/mL      Assessment & Plan:   Problem List Items Addressed This Visit      Unprioritized   Chronic gout involving toe of right foot without tophus   Relevant Orders   Uric acid   Chronic systolic CHF (congestive heart failure) (HCC)    Folowing with cardiology.  Refill Digoxin      Relevant Orders   Digoxin level   Essential hypertension - Primary    Stable, continue present medications.  Relevant Orders   Comprehensive metabolic  panel   TSH   Microalbumin, Urine Waived   History of ovarian cancer    Check CA 125      Relevant Orders   CA 125   Hypercholesteremia    .Stable, continue present medications.        Relevant Orders   Lipid Panel w/o Chol/HDL Ratio   Hypothyroidism    Check TSH      Insomnia    Other Visit Diagnoses    Routine general medical examination at a health care facility       Relevant Orders   CBC with Differential/Platelet   VITAMIN D 25 Hydroxy (Vit-D Deficiency, Fractures)       Follow up plan: Return if symptoms worsen or fail to improve.

## 2017-02-14 LAB — COMPREHENSIVE METABOLIC PANEL
A/G RATIO: 1.7 (ref 1.2–2.2)
ALK PHOS: 95 IU/L (ref 39–117)
ALT: 10 IU/L (ref 0–32)
AST: 14 IU/L (ref 0–40)
Albumin: 4.3 g/dL (ref 3.5–5.5)
BUN/Creatinine Ratio: 18 (ref 9–23)
BUN: 17 mg/dL (ref 6–24)
Bilirubin Total: 0.4 mg/dL (ref 0.0–1.2)
CALCIUM: 9.7 mg/dL (ref 8.7–10.2)
CO2: 27 mmol/L (ref 20–29)
Chloride: 98 mmol/L (ref 96–106)
Creatinine, Ser: 0.92 mg/dL (ref 0.57–1.00)
GFR calc Af Amer: 79 mL/min/{1.73_m2} (ref >59)
GFR, EST NON AFRICAN AMERICAN: 68 mL/min/{1.73_m2} (ref >59)
GLOBULIN, TOTAL: 2.6 g/dL (ref 1.5–4.5)
Glucose: 100 mg/dL — ABNORMAL HIGH (ref 65–99)
Potassium: 5.5 mmol/L — ABNORMAL HIGH (ref 3.5–5.2)
SODIUM: 138 mmol/L (ref 134–144)
Total Protein: 6.9 g/dL (ref 6.0–8.5)

## 2017-02-14 LAB — CBC WITH DIFFERENTIAL/PLATELET
BASOS: 1 %
Basophils Absolute: 0 10*3/uL (ref 0.0–0.2)
EOS (ABSOLUTE): 0.1 10*3/uL (ref 0.0–0.4)
EOS: 2 %
HEMATOCRIT: 41.9 % (ref 34.0–46.6)
HEMOGLOBIN: 14.2 g/dL (ref 11.1–15.9)
Immature Grans (Abs): 0 10*3/uL (ref 0.0–0.1)
Immature Granulocytes: 0 %
LYMPHS ABS: 1.4 10*3/uL (ref 0.7–3.1)
Lymphs: 23 %
MCH: 30.4 pg (ref 26.6–33.0)
MCHC: 33.9 g/dL (ref 31.5–35.7)
MCV: 90 fL (ref 79–97)
MONOCYTES: 10 %
MONOS ABS: 0.6 10*3/uL (ref 0.1–0.9)
NEUTROS ABS: 3.9 10*3/uL (ref 1.4–7.0)
Neutrophils: 64 %
Platelets: 192 10*3/uL (ref 150–379)
RBC: 4.67 x10E6/uL (ref 3.77–5.28)
RDW: 14.5 % (ref 12.3–15.4)
WBC: 6 10*3/uL (ref 3.4–10.8)

## 2017-02-14 LAB — LIPID PANEL W/O CHOL/HDL RATIO
CHOLESTEROL TOTAL: 140 mg/dL (ref 100–199)
HDL: 46 mg/dL (ref >39)
LDL Calculated: 77 mg/dL (ref 0–99)
TRIGLYCERIDES: 85 mg/dL (ref 0–149)
VLDL Cholesterol Cal: 17 mg/dL (ref 5–40)

## 2017-02-14 LAB — TSH: TSH: 3.21 u[IU]/mL (ref 0.450–4.500)

## 2017-02-14 LAB — DIGOXIN LEVEL: Digoxin, Serum: 1 ng/mL — ABNORMAL HIGH (ref 0.5–0.9)

## 2017-02-14 LAB — CA 125: Cancer Antigen (CA) 125: 11.9 U/mL (ref 0.0–38.1)

## 2017-02-14 LAB — VITAMIN D 25 HYDROXY (VIT D DEFICIENCY, FRACTURES): VIT D 25 HYDROXY: 31.8 ng/mL (ref 30.0–100.0)

## 2017-02-16 NOTE — Progress Notes (Signed)
Notified pt by mychart

## 2017-02-26 ENCOUNTER — Ambulatory Visit
Admission: RE | Admit: 2017-02-26 | Discharge: 2017-02-26 | Disposition: A | Discharge status: Home / Self Care | Payer: BLUE CROSS/BLUE SHIELD | Source: Ambulatory Visit | Attending: Unknown Physician Specialty | Admitting: Unknown Physician Specialty

## 2017-02-26 DIAGNOSIS — Z1231 Encounter for screening mammogram for malignant neoplasm of breast: Secondary | ICD-10-CM | POA: Insufficient documentation

## 2017-04-16 ENCOUNTER — Other Ambulatory Visit: Payer: Self-pay | Admitting: Unknown Physician Specialty

## 2017-04-16 NOTE — Telephone Encounter (Signed)
Requesting refill of Klonopin  LOV 02/13/17 with C. Wicker,NP  Started 05/12/16... #30 with 2 refills

## 2017-04-16 NOTE — Telephone Encounter (Signed)
Copied from [place] [id number]. Topic: Quick Communication - See Telephone Encounter >> Apr 16, 2017  3:27 PM Burnis Medin, NT wrote: CRM for notification. See Telephone encounter for: Pt called and wanted to get a refill for clonazePAM (KLONOPIN) 0.5 MG tablet. Pt uses [id number] - [place], [place] - [address]  04/16/17.

## 2017-04-17 MED ORDER — CLONAZEPAM 0.5 MG PO TABS: 0.5000 mg | ORAL_TABLET | Freq: Every day | ORAL | 2 refills | Status: DC | PRN

## 2017-04-17 NOTE — Telephone Encounter (Signed)
RX faxed to the pharmacy

## 2017-04-17 NOTE — Telephone Encounter (Signed)
Routing to provider. Patient last seen 02/13/17.

## 2017-05-12 ENCOUNTER — Encounter: Payer: Self-pay | Admitting: Internal Medicine

## 2017-05-12 ENCOUNTER — Ambulatory Visit (INDEPENDENT_AMBULATORY_CARE_PROVIDER_SITE_OTHER): Payer: BLUE CROSS/BLUE SHIELD | Admitting: Internal Medicine

## 2017-05-12 ENCOUNTER — Other Ambulatory Visit: Payer: Self-pay | Admitting: Unknown Physician Specialty

## 2017-05-12 VITALS — BP 120/70 | HR 67 | Ht 64.5 in | Wt 185.0 lb

## 2017-05-12 DIAGNOSIS — I428 Other cardiomyopathies: Secondary | ICD-10-CM | POA: Diagnosis not present

## 2017-05-12 DIAGNOSIS — Z950 Presence of cardiac pacemaker: Secondary | ICD-10-CM | POA: Diagnosis not present

## 2017-05-12 DIAGNOSIS — I5022 Chronic systolic (congestive) heart failure: Secondary | ICD-10-CM | POA: Diagnosis not present

## 2017-05-12 DIAGNOSIS — I442 Atrioventricular block, complete: Secondary | ICD-10-CM | POA: Diagnosis not present

## 2017-05-12 NOTE — Progress Notes (Signed)
Monia Pouch      Patient Care Team: [name], NP as PCP - General (Nurse Practitioner)   HPI  [name] [name] is a 62 y.o. female Seen to reestablish pacemaker followup. 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 This was initially implanted in 1990s with generator replacement 2012. 8/ 16 She underwent extraction as there was noise on both her atrial and ventricular leads and new device implantation contralaterally.  There was concern for radiation injury given prolonged exposure and hence LV lead placement was not  further pursued after long difficulty in cannulating the concern sinus (GT)  She has a history of a nonischemic cardiomyopathy; she has significant peripheral vascular disease with remote revascularization.  DATE TEST    2010 /  echo 45   7/*15 echo   EF 40-45   7/16 myoview EF 40% No ischemia  10/16  Cath EF 30-35% CA patent   She is complaining more recently of shortness of breath climbing hills.  She does not noted with stairs.  She denies edema.  There has not been nocturnal dyspnea or orthopnea.  Salt intake and fluid intake are replete  Past Medical History:  Diagnosis Date  . Anxiety    takes Klonopin daily as needed  . Arthritis   . Back pain    reason unknown  . Chronic systolic CHF (congestive heart failure) ([place])    a. echo 10/2013: EF 40-45 (apical images 35-40%), diff HK, mild MR, PASP nl; b. echo 12/2014: EF 35-40%, no RWMA, GR1DD, mod TR, PASP nl, small pericardial effusion circum to the heart, no hemo compromise.  . Complete AV block ([place])    a. s/p pacemaker insertion 1996; b. s/p contralateral side St. Jude generator change 11/2014.  . Dizziness    occasionally  . GERD (gastroesophageal reflux disease)    takes Zantac daily as needed  . Gout   . Headache    occasionally  . History of blood clots    in heart-was on Coumadin---this many yrs ago  . History of colon polyps    benign  . Hyperlipidemia    takes  Lovastatin daily  . Hypertension    hx of-since hip replacement MD took resident off meds 10/30/14 b/c well controlled  . Hypothyroid    takes Synthroid daily  . Joint pain   . Joint swelling   . NICM (nonischemic cardiomyopathy) ([place])    a. tachycardia induced->EF 40-45% (2010), 35-40% (10/2013), 35-40% Gr1 DD, mod TR (12/2014);  b. 01/2015 Cath: Nl cors, EF 30-35%, glob HK.  . Numbness and tingling    left leg and right arm  . Ovarian cancer (HCC)    ovarian, skin   . 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 Peripheral artery disease ([place])    stent on lt illiac    Past Surgical History:  Procedure Laterality Date  . ABDOMINAL HYSTERECTOMY    . ANGIOPLASTY / STENTING ILIAC    . CARDIAC CATHETERIZATION N/A 02/08/2015   Procedure: Left Heart Cath and Coronary Angiography;  Surgeon: Wellington Hampshire, MD;  Location: [facility];  Service: Cardiovascular;  Laterality: N/A;  . CERVICAL CERCLAGE  50yrs ago  . COLONOSCOPY    . DG BARIUM SWALLOW ([place] [facility])    . DILATION AND CURETTAGE OF UTERUS    . EP IMPLANTABLE DEVICE Left 12/06/2014   Procedure: Implantation of cardiac resynchronization therapy pacemaker;  Surgeon: Champ Mungo  Lovena Le, MD;  Location: [facility];  Service: Cardiovascular;  Laterality: Left;  . HERNIA MESH REMOVAL Right    incisional  . HERNIA REPAIR    . ILIAC ARTERY STENT  Apr 05, 2010   Left artery stenting  . INSERT / REPLACE / REMOVE PACEMAKER    . JOINT REPLACEMENT  October 31, 2014   hip  . KNEE SURGERY Right   . PACEMAKER INSERTION  2012   Had replaced in July 2012, first placed in 1996  . PACEMAKER LEAD REMOVAL Right 12/06/2014   Procedure: REMOVAL OF RV AND RA PACEMAKER LEADS;  Surgeon: Evans Lance, MD;  Location: [facility];  Service: Cardiovascular;  Laterality: Right;  DR. BARTLE TO BACK UP   . PACEMAKER PLACEMENT Left 12/06/14  . TOTAL ABDOMINAL HYSTERECTOMY W/ BILATERAL  SALPINGOOPHORECTOMY  07/2008   Ovarian cancer  . TOTAL HIP ARTHROPLASTY Right 10/31/2014   Procedure: TOTAL HIP ARTHROPLASTY ANTERIOR APPROACH;  Surgeon: Hessie Knows, MD;  Location: [facility];  Service: Orthopedics;  Laterality: Right;    Current Outpatient Medications  Medication Sig Dispense Refill  . aspirin EC 81 MG tablet Take 81 mg by mouth daily.    . benazepril (LOTENSIN) 5 MG tablet Take 1 tablet (5 mg total) by mouth daily. 90 tablet 3  . clonazePAM (KLONOPIN) 0.5 MG tablet Take 1 tablet (0.5 mg total) by mouth daily as needed for anxiety (sleep). 30 tablet 2  . digoxin (LANOXIN) 0.125 MG tablet TAKE 1 TABLET (0.125 MG TOTAL) BY MOUTH DAILY. 90 tablet 0  . levothyroxine (SYNTHROID, LEVOTHROID) 100 MCG tablet Take 1 tablet (100 mcg total) by mouth daily before breakfast. 90 tablet 3  . lovastatin (MEVACOR) 20 MG tablet Take 1 tablet (20 mg total) by mouth at bedtime. 90 tablet 3   No current facility-administered medications for this visit.     Allergies  Allergen Reactions  . Marshmallow [Althaea Officinalis]     Rash, hives, itching, SOB  . Meat Extract     Rash, hives itching, SOB.  Can tolerate some meats ONLY in moderation  . Other     Jello - Rash, hives, itching, SOB  . Contrast Media [Iodinated Diagnostic Agents]     unknown  . Latex     rash  . Nickel     rash    Review of Systems negative except from HPI and PMH  Physical Exam BP 120/70 (BP Location: Left Arm, Patient Position: Sitting, Cuff Size: Normal)   Pulse 67   Ht 5' 4.5" (1.638 m)   Wt 185 lb (83.9 kg)   LMP  (LMP Unknown)   BMI 31.26 kg/m  Well developed and nourished in no acute distress HENT normal Neck supple with JVP-8-10 positive HJR Carotids brisk and full without bruits Clear Regular rate and rhythm, no murmurs or gallops Abd-soft with active BS without hepatomegaly No Clubbing cyanosis 1+ edema Skin-warm and dry A & Oriented  Grossly normal sensory and motor function   ECG  demonstrates P synchronous pacing 24/16/44  Assessment and  Plan  Complete heart block-congenital  Pacemaker-St. Jude  Nonsichemic Cardiomyopathy    Congestive heart failure-chronic-acute/systolic  Peripheral vascular disease status post iliac stenting  Pacemaker lead revision dual chamber CRT-P can with failure to cannulate the coronary sinus   She e is volume overloaded.  Her diet is replete with sodium and water.  We will first try and reduce salt and water to address her edema rather than resuming diuretics.  We will  reassess left ventricular function.  I worry that with her worsening symptoms that there is been progressive LV dysfunction.  Having failed previously for LV lead access, time limited in part by the fluoroscopy related to the extraction, would have a low threshold for attempting repeat implantation.  Looking at her old pacemaker program, max tracking rate is been reduced from 130--110 and 7% of her beats or so for greater than 110.  Reprogramming may improve exercise tolerance.  It is also noteworthy that there is a relatively significant first-degree AV block on her ECG and reprogramming of her AV intervals may further improve performance.  We will make an effort to do this with an echocardiogram More than 50% of 40  min was spent in counseling related to the above

## 2017-05-12 NOTE — Patient Instructions (Signed)
Medication Instructions: - Your physician recommends that you continue on your current medications as directed. Please refer to the Current Medication list given to you today.  Labwork: - none ordered  Procedures/Testing: - Your physician has requested that you have an echocardiogram. Echocardiography is a painless test that uses sound waves to create images of your heart. It provides your doctor with information about the size and shape of your heart and how well your heart's chambers and valves are working. This procedure takes approximately one hour. There are no restrictions for this procedure.  Follow-Up: - Remote monitoring is used to monitor your Pacemaker of ICD from home. This monitoring reduces the number of office visits required to check your device to one time per year. It allows [place] to keep an eye on the functioning of your device to ensure it is working properly. You are scheduled for a device check from home on 08/11/17. You may send your transmission at any time that day. If you have a wireless device, the transmission will be sent automatically. After your physician reviews your transmission, you will receive a postcard with your next transmission date.  - Your physician wants you to follow-up in: 6 months with Dr. Caryl Comes. You will receive a reminder letter in the mail two months in advance. If you don't receive a letter, please call our office to schedule the follow-up appointment.   Any Additional Special Instructions Will Be Listed Below (If Applicable).     If you need a refill on your cardiac medications before your next appointment, please call your pharmacy.

## 2017-05-14 ENCOUNTER — Telehealth: Payer: Self-pay | Admitting: Internal Medicine

## 2017-05-14 ENCOUNTER — Encounter: Payer: Self-pay | Admitting: *Deleted

## 2017-05-14 LAB — CUP PACEART INCLINIC DEVICE CHECK
Battery Remaining Longevity: 124 mo
Battery Voltage: 2.98 V
Brady Statistic RA Percent Paced: 16 %
Implantable Lead Implant Date: 20160824
Implantable Lead Implant Date: 20160824
Lead Channel Impedance Value: 410 Ohm
Lead Channel Pacing Threshold Pulse Width: 0.5 ms
Lead Channel Sensing Intrinsic Amplitude: 3.1 mV
Lead Channel Setting Pacing Amplitude: 1.5 V
Lead Channel Setting Pacing Pulse Width: 0.5 ms
MDC IDC LEAD LOCATION: 753859
MDC IDC LEAD LOCATION: 753860
MDC IDC MSMT LEADCHNL RA PACING THRESHOLD AMPLITUDE: 0.5 V
MDC IDC MSMT LEADCHNL RA PACING THRESHOLD PULSEWIDTH: 0.5 ms
MDC IDC MSMT LEADCHNL RV IMPEDANCE VALUE: 490 Ohm
MDC IDC MSMT LEADCHNL RV PACING THRESHOLD AMPLITUDE: 0.375 V
MDC IDC MSMT LEADCHNL RV SENSING INTR AMPL: 8.2 mV
MDC IDC PG IMPLANT DT: 20160824
MDC IDC PG SERIAL: 7802897
MDC IDC SESS DTM: 20190129171726
MDC IDC SET LEADCHNL RV PACING AMPLITUDE: 2 V
MDC IDC SET LEADCHNL RV SENSING SENSITIVITY: 2.5 mV
MDC IDC STAT BRADY RV PERCENT PACED: 99.3 %

## 2017-05-14 NOTE — Telephone Encounter (Signed)
Heather  I have set these up before with [place] downstairs That would be better and easier Thanks  I sent an emial to Mayetta and admin that we should address this  Thanks for raising it at your end

## 2017-05-14 NOTE — Telephone Encounter (Signed)
Patient returning call please call again for additional information

## 2017-05-14 NOTE — Telephone Encounter (Signed)
Mychart message sent to the patient .

## 2017-05-14 NOTE — Telephone Encounter (Signed)
Patient wants to know if Dr. Caryl Comes would give her an order for echo at another facility that is cheaper

## 2017-05-14 NOTE — Telephone Encounter (Signed)
I called and spoke with the patient. She states she went on the [employer] and looked at the cost of an echo for [facility] vs [facility] (Dr. Zenia Resides office).  She said the cost was $479 vs $284. She was asking for an order to have her echo at [facility].   I advised her I will need to review with Dr. Caryl Comes and call her back.  In the interim, I have reviewed with our office manager, Shanda Howells and Billing Manager Hoy Morn.  Per DJ, she can send an estimation to the patient of what the breakdown of cost would be for her.  I attempted to call the patient back to make her aware that a cost estimation will be sent to her.   Will forward to Dr. Caryl Comes for further review and to see what his thoughts are on having this done at Dr. Zenia Resides office.

## 2017-05-15 ENCOUNTER — Encounter: Payer: Self-pay | Admitting: *Deleted

## 2017-05-15 NOTE — Telephone Encounter (Signed)
MyChart message sent to the patient with Dr. Olin Pia recommendations. I asked that she check with Dr. Mila Palmer office about cost and let us know.

## 2017-05-18 ENCOUNTER — Other Ambulatory Visit: Payer: Self-pay | Admitting: *Deleted

## 2017-05-18 ENCOUNTER — Encounter: Payer: Self-pay | Admitting: Internal Medicine

## 2017-05-18 NOTE — Progress Notes (Signed)
error 

## 2017-05-18 NOTE — Progress Notes (Signed)
Jerline Pain, Cyril Loosen, RN        Will do!   Previous Messages    ----- Message -----  From: Emily Filbert, RN  Sent: 05/14/2017  3:15 PM  To: Elvina Sidle,   This was the patient that Izora Gala called you about with the echo that is scheduled to be done here in [place].  If you could mail her an estimate of what the breakdown may be for her I would appreciate it.  She is aware I will leave her echo scheduled for 2/15 until she reviews what you send her then she can let me know.   Thanks!  Nira Conn

## 2017-05-25 ENCOUNTER — Encounter: Payer: Self-pay | Admitting: Internal Medicine

## 2017-05-28 ENCOUNTER — Encounter: Payer: Self-pay | Admitting: Unknown Physician Specialty

## 2017-05-29 ENCOUNTER — Other Ambulatory Visit: Payer: BLUE CROSS/BLUE SHIELD

## 2017-07-23 ENCOUNTER — Telehealth: Payer: Self-pay | Admitting: Internal Medicine

## 2017-07-23 NOTE — Telephone Encounter (Signed)
Pt is needing someone to call her back, she states she is to have a Remote Check soon. She states no one has called her back about how much a remote check from home will cost her.   She states if she doesn't hear back from [place] she will just unplug it. She states it has been since January when she asked about this   Please call patient back

## 2017-07-28 NOTE — Telephone Encounter (Signed)
Called patient with requested information.  She was appreciative of the call.  She had questions regarding exactly what a remote check was.  Referred her back to the device clinic and provided phone number for [facility] office.

## 2017-07-29 NOTE — Telephone Encounter (Signed)
Spoke with patient, who declined to discuss her home monitor at this time.  She will plan to call back tomorrow.

## 2017-08-09 ENCOUNTER — Other Ambulatory Visit: Payer: Self-pay | Admitting: Unknown Physician Specialty

## 2017-08-09 DIAGNOSIS — I428 Other cardiomyopathies: Secondary | ICD-10-CM

## 2017-08-14 ENCOUNTER — Ambulatory Visit: Payer: BLUE CROSS/BLUE SHIELD | Admitting: Unknown Physician Specialty

## 2017-08-25 ENCOUNTER — Ambulatory Visit: Payer: BLUE CROSS/BLUE SHIELD | Admitting: Unknown Physician Specialty

## 2017-09-08 ENCOUNTER — Ambulatory Visit (INDEPENDENT_AMBULATORY_CARE_PROVIDER_SITE_OTHER): Payer: BLUE CROSS/BLUE SHIELD | Admitting: Unknown Physician Specialty

## 2017-09-08 ENCOUNTER — Encounter: Payer: Self-pay | Admitting: Unknown Physician Specialty

## 2017-09-08 VITALS — BP 113/77 | HR 75 | Temp 97.8°F | Ht 64.5 in | Wt 182.2 lb

## 2017-09-08 DIAGNOSIS — Z5181 Encounter for therapeutic drug level monitoring: Secondary | ICD-10-CM

## 2017-09-08 DIAGNOSIS — I10 Essential (primary) hypertension: Secondary | ICD-10-CM

## 2017-09-08 DIAGNOSIS — Z8543 Personal history of malignant neoplasm of ovary: Secondary | ICD-10-CM | POA: Diagnosis not present

## 2017-09-08 DIAGNOSIS — F5101 Primary insomnia: Secondary | ICD-10-CM | POA: Diagnosis not present

## 2017-09-08 DIAGNOSIS — I429 Cardiomyopathy, unspecified: Secondary | ICD-10-CM

## 2017-09-08 DIAGNOSIS — E039 Hypothyroidism, unspecified: Secondary | ICD-10-CM | POA: Diagnosis not present

## 2017-09-08 MED ORDER — LOVASTATIN 20 MG PO TABS: 20.0000 mg | ORAL_TABLET | Freq: Every day | ORAL | 3 refills | Status: DC

## 2017-09-08 MED ORDER — BENAZEPRIL HCL 5 MG PO TABS: 5.0000 mg | ORAL_TABLET | Freq: Every day | ORAL | 3 refills | Status: DC

## 2017-09-08 MED ORDER — CLONAZEPAM 0.5 MG PO TABS: 0.5000 mg | ORAL_TABLET | Freq: Every day | ORAL | 1 refills | Status: DC | PRN

## 2017-09-08 MED ORDER — LEVOTHYROXINE SODIUM 100 MCG PO TABS
100.0000 ug | ORAL_TABLET | Freq: Every day | ORAL | 3 refills | Status: DC
Start: 2017-09-08 — End: 2017-09-10

## 2017-09-08 NOTE — Assessment & Plan Note (Signed)
Taking Digoxin per cardiology.  Check levels today.

## 2017-09-08 NOTE — Assessment & Plan Note (Signed)
Stable, continue present medications.   

## 2017-09-08 NOTE — Progress Notes (Signed)
BP 113/77   Pulse 75   Temp 97.8 F (36.6 C) (Oral)   Ht 5' 4.5" (1.638 m)   Wt 182 lb 3.2 oz (82.6 kg)   LMP  (LMP Unknown)   SpO2 95%   BMI 30.79 kg/m    Subjective:    Patient ID: [name] [name], female    DOB: 11/19/1955, 62 y.o.   MRN: [id number]  HPI: [name] [name] is a 62 y.o. female  Chief Complaint  Patient presents with  . Hyperlipidemia  . Hypertension  . Hypothyroidism  . Insomnia   Insomnia Taking Clonazepam about 6 times/month mostly to help her sleep.  Complains primarily of early awakening.    Gout Having flares last visit but not since drinking vinegar every day.    Hypertension Using medications without difficulty Average home BPs not chicking  No problems or lightheadedness No chest pain with exertion or shortness of breath No Edema   Hyperlipidemia Using medications without problems: No Muscle aches  Diet compliance:Exercise: Exercising more.    Hypothyroid No changes in weight or energy.  Last TSH was WNL and 3.2  Heart block with s/p pacemeaker Sees Dr. Caryl Comes with 6 month check-ups.  Taking Digoxin  Relevant past medical, surgical, family and social history reviewed and updated as indicated. Interim medical history since our last visit reviewed. Allergies and medications reviewed and updated.  Review of Systems  All other systems reviewed and are negative.   Per HPI unless specifically indicated above     Objective:    BP 113/77   Pulse 75   Temp 97.8 F (36.6 C) (Oral)   Ht 5' 4.5" (1.638 m)   Wt 182 lb 3.2 oz (82.6 kg)   LMP  (LMP Unknown)   SpO2 95%   BMI 30.79 kg/m   Wt Readings from Last 3 Encounters:  09/08/17 182 lb 3.2 oz (82.6 kg)  05/12/17 185 lb (83.9 kg)  02/13/17 173 lb 12.8 oz (78.8 kg)    Physical Exam  Constitutional: She is oriented to person, place, and time. She appears well-developed and well-nourished. No distress.  HENT:  Head: Normocephalic and atraumatic.  Eyes: Conjunctivae and lids are  normal. Right eye exhibits no discharge. Left eye exhibits no discharge. No scleral icterus.  Neck: Normal range of motion. Neck supple. No JVD present. Carotid bruit is not present.  Cardiovascular: Normal rate, regular rhythm and normal heart sounds.  Pulmonary/Chest: Effort normal and breath sounds normal.  Abdominal: Normal appearance. There is no splenomegaly or hepatomegaly.  Musculoskeletal: Normal range of motion.  Neurological: She is alert and oriented to person, place, and time.  Skin: Skin is warm, dry and intact. No rash noted. No pallor.  Psychiatric: She has a normal mood and affect. Her behavior is normal. Judgment and thought content normal.   Assessment & Plan:   Problem List Items Addressed This Visit      Unprioritized   Essential hypertension    Stable, continue present medications.        Relevant Medications   benazepril (LOTENSIN) 5 MG tablet   lovastatin (MEVACOR) 20 MG tablet   Other Relevant Orders   Comprehensive metabolic panel   Lipid Panel w/o Chol/HDL Ratio   History of ovarian cancer    Wants to have CA-125 done today      Relevant Orders   CA 125   Hypothyroidism    Check TSH today.  Has been on the same schedule  Relevant Medications   levothyroxine (SYNTHROID, LEVOTHROID) 100 MCG tablet   Other Relevant Orders   TSH   Insomnia    Takes about 6 Clonazepam/month.  There has been no change in the dose.  Wrote for #30 with 1 refill      Secondary cardiomyopathy ([place])    Taking Digoxin per cardiology.  Check levels today.        Relevant Medications   benazepril (LOTENSIN) 5 MG tablet   lovastatin (MEVACOR) 20 MG tablet   Other Relevant Orders   Digoxin level    Other Visit Diagnoses    Medication monitoring encounter    -  Primary   Relevant Orders   CBC with Differential/Platelet   Digoxin level       Follow up plan: Return in about 6 months (around 03/11/2018).

## 2017-09-08 NOTE — Assessment & Plan Note (Signed)
Check TSH today.  Has been on the same schedule

## 2017-09-08 NOTE — Assessment & Plan Note (Addendum)
Takes about 6 Clonazepam/month.  There has been no change in the dose.  Wrote for #30 with 1 refill

## 2017-09-08 NOTE — Assessment & Plan Note (Signed)
Wants to have CA-125 done today

## 2017-09-09 ENCOUNTER — Encounter: Payer: Self-pay | Admitting: Unknown Physician Specialty

## 2017-09-09 DIAGNOSIS — E039 Hypothyroidism, unspecified: Secondary | ICD-10-CM

## 2017-09-09 LAB — DIGOXIN LEVEL: DIGOXIN, SERUM: 0.7 ng/mL (ref 0.5–0.9)

## 2017-09-09 LAB — CBC WITH DIFFERENTIAL/PLATELET
Basophils Absolute: 0.1 10*3/uL (ref 0.0–0.2)
Basos: 1 %
EOS (ABSOLUTE): 0.1 10*3/uL (ref 0.0–0.4)
EOS: 2 %
HEMATOCRIT: 43.9 % (ref 34.0–46.6)
HEMOGLOBIN: 14.9 g/dL (ref 11.1–15.9)
IMMATURE GRANULOCYTES: 0 %
Immature Grans (Abs): 0 10*3/uL (ref 0.0–0.1)
LYMPHS ABS: 1.4 10*3/uL (ref 0.7–3.1)
Lymphs: 24 %
MCH: 31.2 pg (ref 26.6–33.0)
MCHC: 33.9 g/dL (ref 31.5–35.7)
MCV: 92 fL (ref 79–97)
MONOCYTES: 11 %
MONOS ABS: 0.7 10*3/uL (ref 0.1–0.9)
NEUTROS PCT: 62 %
Neutrophils Absolute: 3.6 10*3/uL (ref 1.4–7.0)
Platelets: 201 10*3/uL (ref 150–450)
RBC: 4.78 x10E6/uL (ref 3.77–5.28)
RDW: 14.2 % (ref 12.3–15.4)
WBC: 5.9 10*3/uL (ref 3.4–10.8)

## 2017-09-09 LAB — LIPID PANEL W/O CHOL/HDL RATIO
Cholesterol, Total: 188 mg/dL (ref 100–199)
HDL: 58 mg/dL (ref >39)
LDL CALC: 108 mg/dL — AB (ref 0–99)
Triglycerides: 110 mg/dL (ref 0–149)
VLDL Cholesterol Cal: 22 mg/dL (ref 5–40)

## 2017-09-09 LAB — COMPREHENSIVE METABOLIC PANEL
A/G RATIO: 1.6 (ref 1.2–2.2)
ALBUMIN: 4.5 g/dL (ref 3.5–5.5)
ALT: 20 IU/L (ref 0–32)
AST: 22 IU/L (ref 0–40)
Alkaline Phosphatase: 91 IU/L (ref 39–117)
BUN / CREAT RATIO: 14 (ref 9–23)
BUN: 13 mg/dL (ref 6–24)
Bilirubin Total: 0.5 mg/dL (ref 0.0–1.2)
CALCIUM: 9.6 mg/dL (ref 8.7–10.2)
CO2: 22 mmol/L (ref 20–29)
CREATININE: 0.9 mg/dL (ref 0.57–1.00)
Chloride: 100 mmol/L (ref 96–106)
GFR, EST AFRICAN AMERICAN: 81 mL/min/{1.73_m2} (ref >59)
GFR, EST NON AFRICAN AMERICAN: 70 mL/min/{1.73_m2} (ref >59)
GLOBULIN, TOTAL: 2.9 g/dL (ref 1.5–4.5)
Glucose: 100 mg/dL — ABNORMAL HIGH (ref 65–99)
POTASSIUM: 4.7 mmol/L (ref 3.5–5.2)
SODIUM: 137 mmol/L (ref 134–144)
TOTAL PROTEIN: 7.4 g/dL (ref 6.0–8.5)

## 2017-09-09 LAB — CA 125: CANCER ANTIGEN (CA) 125: 14.1 U/mL (ref 0.0–38.1)

## 2017-09-09 LAB — TSH: TSH: 6.12 u[IU]/mL — AB (ref 0.450–4.500)

## 2017-09-10 MED ORDER — LEVOTHYROXINE SODIUM 112 MCG PO TABS: 112.0000 ug | ORAL_TABLET | Freq: Every day | ORAL | 0 refills | Status: DC

## 2017-09-17 ENCOUNTER — Encounter: Payer: Self-pay | Admitting: Unknown Physician Specialty

## 2017-10-05 ENCOUNTER — Encounter: Payer: BLUE CROSS/BLUE SHIELD | Admitting: *Deleted

## 2017-10-07 ENCOUNTER — Encounter: Payer: Self-pay | Admitting: Cardiology

## 2017-10-08 ENCOUNTER — Encounter: Payer: Self-pay | Admitting: Internal Medicine

## 2017-10-08 NOTE — Telephone Encounter (Signed)
LVM informing pt that transmission was not received direct number to device clinic given.

## 2017-10-09 ENCOUNTER — Encounter: Payer: Self-pay | Admitting: Internal Medicine

## 2017-10-19 ENCOUNTER — Encounter: Payer: Self-pay | Admitting: Internal Medicine

## 2017-11-02 ENCOUNTER — Ambulatory Visit (INDEPENDENT_AMBULATORY_CARE_PROVIDER_SITE_OTHER): Payer: BLUE CROSS/BLUE SHIELD | Admitting: *Deleted

## 2017-11-02 DIAGNOSIS — I442 Atrioventricular block, complete: Secondary | ICD-10-CM | POA: Diagnosis not present

## 2017-11-02 NOTE — Progress Notes (Signed)
Remote pacemaker transmission.   

## 2017-11-03 ENCOUNTER — Encounter: Payer: Self-pay | Admitting: Internal Medicine

## 2017-11-03 ENCOUNTER — Encounter: Payer: Self-pay | Admitting: Cardiology

## 2017-11-05 ENCOUNTER — Encounter: Payer: Self-pay | Admitting: Internal Medicine

## 2017-11-06 ENCOUNTER — Other Ambulatory Visit: Payer: Self-pay | Admitting: Unknown Physician Specialty

## 2017-11-06 DIAGNOSIS — I428 Other cardiomyopathies: Secondary | ICD-10-CM

## 2017-11-08 NOTE — Telephone Encounter (Signed)
LOV 09/08/2017 with cheryl wicker / Refill request for Digoxin / Last digoxin level on 09/08/2017 / Sending to provider for review

## 2017-12-02 LAB — CUP PACEART REMOTE DEVICE CHECK
Battery Remaining Longevity: 118 mo
Battery Voltage: 2.98 V
Brady Statistic AS VP Percent: 83 %
Brady Statistic RA Percent Paced: 17 %
Brady Statistic RV Percent Paced: 99 %
Date Time Interrogation Session: 20190720070210
Implantable Lead Implant Date: 20160824
Implantable Lead Location: 753859
Implantable Pulse Generator Implant Date: 20160824
Lead Channel Impedance Value: 410 Ohm
Lead Channel Impedance Value: 530 Ohm
Lead Channel Pacing Threshold Amplitude: 0.5 V
Lead Channel Pacing Threshold Pulse Width: 0.5 ms
Lead Channel Sensing Intrinsic Amplitude: 8.2 mV
Lead Channel Setting Pacing Amplitude: 1.5 V
Lead Channel Setting Sensing Sensitivity: 2.5 mV
MDC IDC LEAD IMPLANT DT: 20160824
MDC IDC LEAD LOCATION: 753860
MDC IDC MSMT BATTERY REMAINING PERCENTAGE: 95.5 %
MDC IDC MSMT LEADCHNL RA SENSING INTR AMPL: 1.5 mV
MDC IDC MSMT LEADCHNL RV PACING THRESHOLD AMPLITUDE: 0.375 V
MDC IDC MSMT LEADCHNL RV PACING THRESHOLD PULSEWIDTH: 0.5 ms
MDC IDC SET LEADCHNL RV PACING AMPLITUDE: 2 V
MDC IDC SET LEADCHNL RV PACING PULSEWIDTH: 0.5 ms
MDC IDC STAT BRADY AP VP PERCENT: 17 %
MDC IDC STAT BRADY AP VS PERCENT: 1 %
MDC IDC STAT BRADY AS VS PERCENT: 1 %
Pulse Gen Serial Number: 7802897

## 2017-12-06 ENCOUNTER — Telehealth: Payer: Self-pay | Admitting: Unknown Physician Specialty

## 2017-12-07 NOTE — Telephone Encounter (Signed)
Patient notified.  She will come in on Friday.

## 2017-12-07 NOTE — Telephone Encounter (Signed)
Needs blood work before refill. Order in. Patient needs to come in to have it drawn.

## 2017-12-10 DIAGNOSIS — E039 Hypothyroidism, unspecified: Secondary | ICD-10-CM

## 2017-12-11 ENCOUNTER — Other Ambulatory Visit: Payer: BLUE CROSS/BLUE SHIELD

## 2017-12-11 DIAGNOSIS — E039 Hypothyroidism, unspecified: Secondary | ICD-10-CM

## 2017-12-11 MED ORDER — LEVOTHYROXINE SODIUM 112 MCG PO TABS: 112.0000 ug | ORAL_TABLET | Freq: Every day | ORAL | 0 refills | Status: DC

## 2017-12-11 NOTE — Telephone Encounter (Signed)
Pt came in for labs today and would like to know if she can have a weeks worth of medication sent to [facility].

## 2017-12-11 NOTE — Addendum Note (Signed)
Addended by: Valerie Roys on: 12/11/2017 10:51 AM   Modules accepted: Orders

## 2017-12-12 LAB — TSH: TSH: 2.55 u[IU]/mL (ref 0.450–4.500)

## 2017-12-15 ENCOUNTER — Other Ambulatory Visit: Payer: Self-pay | Admitting: Family Medicine

## 2017-12-15 DIAGNOSIS — E039 Hypothyroidism, unspecified: Secondary | ICD-10-CM

## 2017-12-15 MED ORDER — LEVOTHYROXINE SODIUM 112 MCG PO TABS: 112.0000 ug | ORAL_TABLET | Freq: Every day | ORAL | 3 refills | Status: DC

## 2018-01-18 DIAGNOSIS — D2262 Melanocytic nevi of left upper limb, including shoulder: Secondary | ICD-10-CM | POA: Diagnosis not present

## 2018-01-18 DIAGNOSIS — D485 Neoplasm of uncertain behavior of skin: Secondary | ICD-10-CM | POA: Diagnosis not present

## 2018-01-18 DIAGNOSIS — D2272 Melanocytic nevi of left lower limb, including hip: Secondary | ICD-10-CM | POA: Diagnosis not present

## 2018-01-18 DIAGNOSIS — L858 Other specified epidermal thickening: Secondary | ICD-10-CM | POA: Diagnosis not present

## 2018-01-18 DIAGNOSIS — L821 Other seborrheic keratosis: Secondary | ICD-10-CM | POA: Diagnosis not present

## 2018-01-18 DIAGNOSIS — D2261 Melanocytic nevi of right upper limb, including shoulder: Secondary | ICD-10-CM | POA: Diagnosis not present

## 2018-01-21 DIAGNOSIS — Z23 Encounter for immunization: Secondary | ICD-10-CM | POA: Diagnosis not present

## 2018-01-31 ENCOUNTER — Other Ambulatory Visit: Payer: Self-pay | Admitting: Unknown Physician Specialty

## 2018-01-31 DIAGNOSIS — I428 Other cardiomyopathies: Secondary | ICD-10-CM

## 2018-02-01 NOTE — Telephone Encounter (Signed)
Requested Prescriptions  Pending Prescriptions Disp Refills  . digoxin (LANOXIN) 0.125 MG tablet [Pharmacy Med Name: DIGOXIN 125 MCG TABLET] 90 tablet 0    Sig: TAKE 1 TABLET (0.125 MG TOTAL) BY MOUTH DAILY.     Cardiovascular:  Antiarrhythmic Agents - digoxin Passed - 01/31/2018  9:17 AM      Passed - Cr in normal range and within 180 days    Creatinine, Ser  Date Value Ref Range Status  09/08/2017 0.90 0.57 - 1.00 mg/dL Final         Passed - Digoxin (serum) in normal range and within 180 days    Digoxin, Serum  Date Value Ref Range Status  03/05/2015 0.5 (L) 0.9 - 2.0 ng/mL Final    Comment:    Concentrations above 2.0 ng/mL are generally considered toxic. Some overlap of toxic and non-toxic values have been reported.                             Detection Limit = 0.4 ng/mL          Passed - Ca in normal range and within 180 days    Calcium  Date Value Ref Range Status  09/08/2017 9.6 8.7 - 10.2 mg/dL Final         Passed - K in normal range and within 180 days    Potassium  Date Value Ref Range Status  09/08/2017 4.7 3.5 - 5.2 mmol/L Final         Passed - Last Heart Rate in normal range    Pulse Readings from Last 1 Encounters:  09/08/17 75         Passed - Valid encounter within last 6 months    Recent Outpatient Visits          4 months ago Medication monitoring encounter   [facility] [name], NP   11 months ago Essential hypertension   [place], NP   1 year ago NICM (nonischemic cardiomyopathy) ([place])   Gray, NP   2 years ago Routine general medical examination at a health care facility   [place], NP   2 years ago Essential hypertension   [facility] [name], NP      Future Appointments            In 1 month [name], NP [facility], [place]

## 2018-02-23 ENCOUNTER — Other Ambulatory Visit: Payer: Self-pay | Admitting: Family Medicine

## 2018-02-23 DIAGNOSIS — Z1231 Encounter for screening mammogram for malignant neoplasm of breast: Secondary | ICD-10-CM

## 2018-03-03 ENCOUNTER — Ambulatory Visit
Admission: RE | Admit: 2018-03-03 | Discharge: 2018-03-03 | Disposition: A | Discharge status: Home / Self Care | Payer: BLUE CROSS/BLUE SHIELD | Source: Ambulatory Visit | Attending: Family Medicine | Admitting: Family Medicine

## 2018-03-03 DIAGNOSIS — Z1231 Encounter for screening mammogram for malignant neoplasm of breast: Secondary | ICD-10-CM

## 2018-03-12 ENCOUNTER — Encounter: Payer: BLUE CROSS/BLUE SHIELD | Admitting: Unknown Physician Specialty

## 2018-03-17 ENCOUNTER — Encounter: Payer: Self-pay | Admitting: Family Medicine

## 2018-03-17 ENCOUNTER — Ambulatory Visit (INDEPENDENT_AMBULATORY_CARE_PROVIDER_SITE_OTHER): Payer: BLUE CROSS/BLUE SHIELD | Admitting: Family Medicine

## 2018-03-17 VITALS — BP 121/79 | HR 68 | Temp 97.6°F | Ht 66.54 in | Wt 190.0 lb

## 2018-03-17 DIAGNOSIS — I442 Atrioventricular block, complete: Secondary | ICD-10-CM

## 2018-03-17 DIAGNOSIS — I5022 Chronic systolic (congestive) heart failure: Secondary | ICD-10-CM

## 2018-03-17 DIAGNOSIS — Z8543 Personal history of malignant neoplasm of ovary: Secondary | ICD-10-CM

## 2018-03-17 DIAGNOSIS — I428 Other cardiomyopathies: Secondary | ICD-10-CM

## 2018-03-17 DIAGNOSIS — E7800 Pure hypercholesterolemia, unspecified: Secondary | ICD-10-CM

## 2018-03-17 DIAGNOSIS — E039 Hypothyroidism, unspecified: Secondary | ICD-10-CM

## 2018-03-17 DIAGNOSIS — I739 Peripheral vascular disease, unspecified: Secondary | ICD-10-CM

## 2018-03-17 DIAGNOSIS — I10 Essential (primary) hypertension: Secondary | ICD-10-CM

## 2018-03-17 DIAGNOSIS — Z0000 Encounter for general adult medical examination without abnormal findings: Secondary | ICD-10-CM

## 2018-03-17 DIAGNOSIS — F5101 Primary insomnia: Secondary | ICD-10-CM

## 2018-03-17 LAB — UA/M W/RFLX CULTURE, ROUTINE
BILIRUBIN UA: NEGATIVE
GLUCOSE, UA: NEGATIVE
Ketones, UA: NEGATIVE
Leukocytes, UA: NEGATIVE
Nitrite, UA: NEGATIVE
PROTEIN UA: NEGATIVE
RBC, UA: NEGATIVE
Specific Gravity, UA: <1.005 — ABNORMAL LOW (ref 1.005–1.030)
Urobilinogen, Ur: 0.2 mg/dL (ref 0.2–1.0)
pH, UA: 6 (ref 5.0–7.5)

## 2018-03-17 NOTE — Assessment & Plan Note (Signed)
Rarely an issue per patient, taking klonopin on rare occasions. Continue melatonin daily. Discussed today significant addiction and respiratory depression risks with BZDs and that they aren't necessarily the best option for sleep. Offered multiple alternatives including trazodone, unisom and belsomra. Pt states it's not enough of an issue for her to try something else at this point. Advised her to let me know if it becomes an issue again and we will review safer alternatives to klonopin. Will not refill.

## 2018-03-17 NOTE — Progress Notes (Addendum)
BP 121/79   Pulse 68   Temp 97.6 F (36.4 C) (Oral)   Ht 5' 6.54" (1.69 m)   Wt 190 lb (86.2 kg)   LMP  (LMP Unknown)   SpO2 98%   BMI 30.18 kg/m    Subjective:    Patient ID: [name] [name], female    DOB: 23-May-1955, 62 y.o.   MRN: [id number]  HPI: [name] is a 62 y.o. female presenting on 03/17/2018 for comprehensive medical examination. Current medical complaints include:none  Taking lovastatin without issue for cholesterol/PAD management. No myalgias, claudication, CP, SOB.   BPs stable on benazepril, not checking regularly at home but always WNL at OVs.   Tolerating digoxin well. Followed by Cardiology for heart block with permanent pacemaker as well as Chronic systolic CHF and NICM. No current angina or DOE.   On synthroid for hypothyroidism, asymptomatic.   Rarely has issues with sleep, has historically taken klonopin on these occasions with good relief. Denies side effects. Does not try anything OTC.   She currently lives with: Menopausal Symptoms: no  Depression Screen done today and results listed below:  Depression screen [facility] 2/9 03/17/2018 02/13/2017 07/25/2016 01/17/2015  Decreased Interest 0 0 0 0  Down, Depressed, Hopeless 0 0 0 0  PHQ - 2 Score 0 0 0 0  Altered sleeping 0 1 1 -  Tired, decreased energy 0 0 0 -  Change in appetite 0 0 0 -  Feeling bad or failure about yourself  0 0 0 -  Trouble concentrating 0 0 0 -  Moving slowly or fidgety/restless 0 0 0 -  Suicidal thoughts 0 0 0 -  PHQ-9 Score 0 1 1 -    The patient does not have a history of falls. I did not complete a risk assessment for falls. A plan of care for falls was not documented.   Past Medical History:  Past Medical History:  Diagnosis Date  . Anxiety    takes Klonopin daily as needed  . Arthritis   . Back pain    reason unknown  . Chronic systolic CHF (congestive heart failure) ([place])    a. echo 10/2013: EF 40-45 (apical images 35-40%), diff HK, mild MR, PASP nl; b. echo  12/2014: EF 35-40%, no RWMA, GR1DD, mod TR, PASP nl, small pericardial effusion circum to the heart, no hemo compromise.  . 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 s/p pacemaker insertion 1996; b. s/p contralateral side [employer] generator change 11/2014.  . Dizziness    occasionally  . GERD (gastroesophageal reflux disease)    takes Zantac daily as needed  . Gout   . Headache    occasionally  . History of blood clots    in heart-was on Coumadin---this many yrs ago  . History of colon polyps    benign  . Hyperlipidemia    takes Lovastatin daily  . Hypertension    hx of-since hip replacement MD took resident off meds 10/30/14 b/c well controlled  . Hypothyroid    takes Synthroid daily  . Joint pain   . Joint swelling   . NICM (nonischemic cardiomyopathy) ([place])    a. tachycardia induced->EF 40-45% (2010), 35-40% (10/2013), 35-40% Gr1 DD, mod TR (12/2014);  b. 01/2015 Cath: Nl cors, EF 30-35%, glob HK.  . Numbness and tingling    left leg and right arm  . Ovarian cancer (HCC)    ovarian, skin   . Pacemaker lead failure--noise on atrial greater  than ventricular lead with ventricular backup pulse pacing 10/17/2014  . Peripheral artery disease ([place])    stent on lt illiac    Surgical History:  Past Surgical History:  Procedure Laterality Date  . ABDOMINAL HYSTERECTOMY    . ANGIOPLASTY / STENTING ILIAC    . CARDIAC CATHETERIZATION N/A 02/08/2015   Procedure: Left Heart Cath and Coronary Angiography;  Surgeon: Wellington Hampshire, MD;  Location: [place] [facility];  Service: Cardiovascular;  Laterality: N/A;  . CERVICAL CERCLAGE  82yrs ago  . COLONOSCOPY    . DG BARIUM SWALLOW ([place] [facility])    . DILATION AND CURETTAGE OF UTERUS    . EP IMPLANTABLE DEVICE Left 12/06/2014   Procedure: Implantation of cardiac resynchronization therapy pacemaker;  Surgeon: Evans Lance, MD;  Location: [place];  Service: Cardiovascular;  Laterality: Left;  . HERNIA MESH REMOVAL Right    incisional  . HERNIA REPAIR    .  ILIAC ARTERY STENT  Apr 05, 2010   Left artery stenting  . INSERT / REPLACE / REMOVE PACEMAKER    . JOINT REPLACEMENT  October 31, 2014   hip  . KNEE SURGERY Right   . PACEMAKER INSERTION  2012   Had replaced in July 2012, first placed in 1996  . PACEMAKER LEAD REMOVAL Right 12/06/2014   Procedure: REMOVAL OF RV AND RA PACEMAKER LEADS;  Surgeon: Evans Lance, MD;  Location: [place];  Service: Cardiovascular;  Laterality: Right;  DR. BARTLE TO BACK UP   . PACEMAKER PLACEMENT Left 12/06/14  . TOTAL ABDOMINAL HYSTERECTOMY W/ BILATERAL SALPINGOOPHORECTOMY  07/2008   Ovarian cancer  . TOTAL HIP ARTHROPLASTY Right 10/31/2014   Procedure: TOTAL HIP ARTHROPLASTY ANTERIOR APPROACH;  Surgeon: Hessie Knows, MD;  Location: [facility];  Service: Orthopedics;  Laterality: Right;    Medications:  Current Outpatient Medications on File Prior to Visit  Medication Sig  . aspirin EC 81 MG tablet Take 81 mg by mouth daily.  . clonazePAM (KLONOPIN) 0.5 MG tablet Take 1 tablet (0.5 mg total) by mouth daily as needed for anxiety (sleep).  Marland Kitchen digoxin (LANOXIN) 0.125 MG tablet TAKE 1 TABLET (0.125 MG TOTAL) BY MOUTH DAILY.  Marland Kitchen levothyroxine (SYNTHROID, LEVOTHROID) 112 MCG tablet Take 1 tablet (112 mcg total) by mouth daily.  Marland Kitchen lovastatin (MEVACOR) 20 MG tablet Take 1 tablet (20 mg total) by mouth at bedtime.   No current facility-administered medications on file prior to visit.     Allergies:  Allergies  Allergen Reactions  . Marshmallow [Althaea Officinalis]     Rash, hives, itching, SOB  . Meat Extract     Rash, hives itching, SOB.  Can tolerate some meats ONLY in moderation  . Other     Jello - Rash, hives, itching, SOB  . Contrast Media [Iodinated Diagnostic Agents]     unknown  . Latex     rash  . Nickel     rash    Social History:  Social History   Socioeconomic History  . Marital status: Married    Spouse name: Not on file  . Number of children: Not on file  . Years of education: Not on file   . Highest education level: Not on file  Occupational History  . Not on file  Social Needs  . Financial resource strain: Not on file  . Food insecurity:    Worry: Not on file    Inability: Not on file  . Transportation needs:    Medical: Not  on file    Non-medical: Not on file  Tobacco Use  . Smoking status: Former Smoker    Last attempt to quit: 07/17/2008    Years since quitting: 9.6  . Smokeless tobacco: Never Used  . Tobacco comment: quit smoking 66yrs ago  Substance and Sexual Activity  . Alcohol use: Yes    Alcohol/week: 20.0 standard drinks    Types: 20 Cans of beer per week    Comment: beer couple a day  . Drug use: No  . Sexual activity: Never    Birth control/protection: Surgical  Lifestyle  . Physical activity:    Days per week: Not on file    Minutes per session: Not on file  . Stress: Not on file  Relationships  . Social connections:    Talks on phone: Not on file    Gets together: Not on file    Attends religious service: Not on file    Active member of club or organization: Not on file    Attends meetings of clubs or organizations: Not on file    Relationship status: Not on file  . Intimate partner violence:    Fear of current or ex partner: Not on file    Emotionally abused: Not on file    Physically abused: Not on file    Forced sexual activity: Not on file  Other Topics Concern  . Not on file  Social History Narrative  . Not on file   Social History   Tobacco Use  Smoking Status Former Smoker  . Last attempt to quit: 07/17/2008  . Years since quitting: 9.6  Smokeless Tobacco Never Used  Tobacco Comment   quit smoking 3yrs ago   Social History   Substance and Sexual Activity  Alcohol Use Yes  . Alcohol/week: 20.0 standard drinks  . Types: 20 Cans of beer per week   Comment: beer couple a day    Family History:  Family History  Problem Relation Age of Onset  . Cancer Father        died of lung cancer  . Lung cancer Father   .  Diabetes Brother   . Hyperlipidemia Mother   . Polymyalgia rheumatica Mother   . Arrhythmia Son     Past medical history, surgical history, medications, allergies, family history and social history reviewed with patient today and changes made to appropriate areas of the chart.   Review of Systems - General ROS: negative Psychological ROS: negative Ophthalmic ROS: negative ENT ROS: negative Allergy and Immunology ROS: negative Hematological and Lymphatic ROS: negative Endocrine ROS: negative Breast ROS: negative for breast lumps Respiratory ROS: no cough, shortness of breath, or wheezing Cardiovascular ROS: no chest pain or dyspnea on exertion Gastrointestinal ROS: no abdominal pain, change in bowel habits, or black or bloody stools Genito-Urinary ROS: no dysuria, trouble voiding, or hematuria Musculoskeletal ROS: negative Neurological ROS: no TIA or stroke symptoms Dermatological ROS: negative All other ROS negative except what is listed above and in the HPI.      Objective:    BP 121/79   Pulse 68   Temp 97.6 F (36.4 C) (Oral)   Ht 5' 6.54" (1.69 m)   Wt 190 lb (86.2 kg)   LMP  (LMP Unknown)   SpO2 98%   BMI 30.18 kg/m   Wt Readings from Last 3 Encounters:  03/17/18 190 lb (86.2 kg)  09/08/17 182 lb 3.2 oz (82.6 kg)  05/12/17 185 lb (83.9 kg)    Physical  Exam  Constitutional: She is oriented to person, place, and time. She appears well-developed and well-nourished. No distress.  HENT:  Head: Atraumatic.  Right Ear: External ear normal.  Left Ear: External ear normal.  Nose: Nose normal.  Mouth/Throat: Oropharynx is clear and moist. No oropharyngeal exudate.  Eyes: Pupils are equal, round, and reactive to light. Conjunctivae and EOM are normal. No scleral icterus.  Neck: Normal range of motion. Neck supple. No thyromegaly present.  Cardiovascular: Normal rate, regular rhythm and intact distal pulses.  Pulmonary/Chest: Effort normal and breath sounds normal. No  respiratory distress.  Abdominal: Soft. Bowel sounds are normal. She exhibits no mass. There is no tenderness.  Musculoskeletal: Normal range of motion. She exhibits no edema or tenderness.  Lymphadenopathy:    She has no cervical adenopathy.  Neurological: She is alert and oriented to person, place, and time. No cranial nerve deficit.  Skin: Skin is warm and dry. No rash noted.  Psychiatric: She has a normal mood and affect. Her behavior is normal.  Nursing note and vitals reviewed.   Results for orders placed or performed in visit on 03/17/18  CBC with Differential/Platelet  Result Value Ref Range   WBC 6.9 3.4 - 10.8 x10E3/uL   RBC 4.70 3.77 - 5.28 x10E6/uL   Hemoglobin 14.5 11.1 - 15.9 g/dL   Hematocrit 43.2 34.0 - 46.6 %   MCV 92 79 - 97 fL   MCH 30.9 26.6 - 33.0 pg   MCHC 33.6 31.5 - 35.7 g/dL   RDW 13.4 12.3 - 15.4 %   Platelets 205 150 - 450 x10E3/uL   Neutrophils 64 Not Estab. %   Lymphs 23 Not Estab. %   Monocytes 10 Not Estab. %   Eos 2 Not Estab. %   Basos 1 Not Estab. %   Neutrophils Absolute 4.5 1.4 - 7.0 x10E3/uL   Lymphocytes Absolute 1.6 0.7 - 3.1 x10E3/uL   Monocytes Absolute 0.7 0.1 - 0.9 x10E3/uL   EOS (ABSOLUTE) 0.1 0.0 - 0.4 x10E3/uL   Basophils Absolute 0.1 0.0 - 0.2 x10E3/uL   Immature Granulocytes 0 Not Estab. %   Immature Grans (Abs) 0.0 0.0 - 0.1 x10E3/uL  Comprehensive metabolic panel  Result Value Ref Range   Glucose 103 (H) 65 - 99 mg/dL   BUN 12 8 - 27 mg/dL   Creatinine, Ser 0.87 0.57 - 1.00 mg/dL   GFR calc non Af Amer 73 >59 mL/min/1.73   GFR calc Af Amer 84 >59 mL/min/1.73   BUN/Creatinine Ratio 14 12 - 28   Sodium 135 134 - 144 mmol/L   Potassium 5.5 (H) 3.5 - 5.2 mmol/L   Chloride 97 96 - 106 mmol/L   CO2 23 20 - 29 mmol/L   Calcium 9.9 8.7 - 10.3 mg/dL   Total Protein 7.2 6.0 - 8.5 g/dL   Albumin 4.5 3.6 - 4.8 g/dL   Globulin, Total 2.7 1.5 - 4.5 g/dL   Albumin/Globulin Ratio 1.7 1.2 - 2.2   Bilirubin Total 0.5 0.0 - 1.2 mg/dL     Alkaline Phosphatase 103 39 - 117 IU/L   AST 20 0 - 40 IU/L   ALT 15 0 - 32 IU/L  Lipid Panel w/o Chol/HDL Ratio  Result Value Ref Range   Cholesterol, Total 185 100 - 199 mg/dL   Triglycerides 117 0 - 149 mg/dL   HDL 57 >39 mg/dL   VLDL Cholesterol Cal 23 5 - 40 mg/dL   LDL Calculated 105 (H) 0 - 99 mg/dL  TSH  Result Value Ref Range   TSH 2.450 0.450 - 4.500 uIU/mL  UA/M w/rflx Culture, Routine  Result Value Ref Range   Specific Gravity, UA <1.005 (L) 1.005 - 1.030   pH, UA 6.0 5.0 - 7.5   Color, UA Yellow Yellow   Appearance Ur Clear Clear   Leukocytes, UA Negative Negative   Protein, UA Negative Negative/Trace   Glucose, UA Negative Negative   Ketones, UA Negative Negative   RBC, UA Negative Negative   Bilirubin, UA Negative Negative   Urobilinogen, Ur 0.2 0.2 - 1.0 mg/dL   Nitrite, UA Negative Negative  CA 125  Result Value Ref Range   Cancer Antigen (CA) 125 13.0 0.0 - 38.1 U/mL      Assessment & Plan:   Problem List Items Addressed This Visit      Cardiovascular and Mediastinum   Peripheral artery disease (HCC)   Essential hypertension - Primary    Stable and WNL, continue current regimen      Relevant Orders   CBC with Differential/Platelet (Completed)   Comprehensive metabolic panel (Completed)   UA/M w/rflx Culture, Routine (Completed)   Complete AV block (HCC)    Followed by Cardiology, on pacemaker      NICM (nonischemic cardiomyopathy) ([place])   Chronic systolic CHF (congestive heart failure) ([place])    Followed by Cardiology, euvolemic today. Continue current regimen including sodium restriction        Endocrine   Hypothyroidism    Recheck TSH, adjust if needed      Relevant Orders   TSH (Completed)     Other   Insomnia    Rarely an issue per patient, taking klonopin on rare occasions. Continue melatonin daily. Discussed today significant addiction and respiratory depression risks with BZDs and that they aren't necessarily the best option  for sleep. Offered multiple alternatives including trazodone, unisom and belsomra. Pt states it's not enough of an issue for her to try something else at this point. Advised her to let me know if it becomes an issue again and we will review safer alternatives to klonopin. Will not refill.       Hypercholesteremia    Recheck lipids, adjust as needed. Continue current regimen      Relevant Orders   Lipid Panel w/o Chol/HDL Ratio (Completed)   History of ovarian cancer    Check CA125 today      Relevant Orders   CA 125 (Completed)    Other Visit Diagnoses    Annual physical exam           Follow up plan: Return in about 6 months (around 09/16/2018) for 6 month f/u.   LABORATORY TESTING:  - Pap smear: not applicable  IMMUNIZATIONS:   - Tdap: Tetanus vaccination status reviewed: last tetanus booster within 10 years. - Influenza: Up to date - Pneumovax: Not applicable - Prevnar: Not applicable - HPV: Not applicable - Zostavax vaccine: Refused  SCREENING: -Mammogram: Up to date  - Colonoscopy: Up to date  - Bone Density: Not applicable   PATIENT COUNSELING:   Advised to take 1 mg of folate supplement per day if capable of pregnancy.   Sexuality: Discussed sexually transmitted diseases, partner selection, use of condoms, avoidance of unintended pregnancy  and contraceptive alternatives.   Advised to avoid cigarette smoking.  I discussed with the patient that most people either abstain from alcohol or drink within safe limits (<=14/week and <=4 drinks/occasion for males, <=7/weeks and <= 3 drinks/occasion for females)  and that the risk for alcohol disorders and other health effects rises proportionally with the number of drinks per week and how often a drinker exceeds daily limits.  Discussed cessation/primary prevention of drug use and availability of treatment for abuse.   Diet: Encouraged to adjust caloric intake to maintain  or achieve ideal body weight, to reduce intake  of dietary saturated fat and total fat, to limit sodium intake by avoiding high sodium foods and not adding table salt, and to maintain adequate dietary potassium and calcium preferably from fresh fruits, vegetables, and low-fat dairy products.    stressed the importance of regular exercise  Injury prevention: Discussed safety belts, safety helmets, smoke detector, smoking near bedding or upholstery.   Dental health: Discussed importance of regular tooth brushing, flossing, and dental visits.    NEXT PREVENTATIVE PHYSICAL DUE IN 1 YEAR. Return in about 6 months (around 09/16/2018) for 6 month f/u.

## 2018-03-18 ENCOUNTER — Other Ambulatory Visit: Payer: Self-pay | Admitting: Family Medicine

## 2018-03-18 LAB — COMPREHENSIVE METABOLIC PANEL
ALT: 15 IU/L (ref 0–32)
AST: 20 IU/L (ref 0–40)
Albumin/Globulin Ratio: 1.7 (ref 1.2–2.2)
Albumin: 4.5 g/dL (ref 3.6–4.8)
Alkaline Phosphatase: 103 IU/L (ref 39–117)
BUN/Creatinine Ratio: 14 (ref 12–28)
BUN: 12 mg/dL (ref 8–27)
Bilirubin Total: 0.5 mg/dL (ref 0.0–1.2)
CALCIUM: 9.9 mg/dL (ref 8.7–10.3)
CO2: 23 mmol/L (ref 20–29)
CREATININE: 0.87 mg/dL (ref 0.57–1.00)
Chloride: 97 mmol/L (ref 96–106)
GFR calc Af Amer: 84 mL/min/{1.73_m2} (ref >59)
GFR, EST NON AFRICAN AMERICAN: 73 mL/min/{1.73_m2} (ref >59)
GLUCOSE: 103 mg/dL — AB (ref 65–99)
Globulin, Total: 2.7 g/dL (ref 1.5–4.5)
Potassium: 5.5 mmol/L — ABNORMAL HIGH (ref 3.5–5.2)
Sodium: 135 mmol/L (ref 134–144)
TOTAL PROTEIN: 7.2 g/dL (ref 6.0–8.5)

## 2018-03-18 LAB — CBC WITH DIFFERENTIAL/PLATELET
BASOS ABS: 0.1 10*3/uL (ref 0.0–0.2)
BASOS: 1 %
EOS (ABSOLUTE): 0.1 10*3/uL (ref 0.0–0.4)
Eos: 2 %
Hematocrit: 43.2 % (ref 34.0–46.6)
Hemoglobin: 14.5 g/dL (ref 11.1–15.9)
IMMATURE GRANULOCYTES: 0 %
Immature Grans (Abs): 0 10*3/uL (ref 0.0–0.1)
LYMPHS ABS: 1.6 10*3/uL (ref 0.7–3.1)
Lymphs: 23 %
MCH: 30.9 pg (ref 26.6–33.0)
MCHC: 33.6 g/dL (ref 31.5–35.7)
MCV: 92 fL (ref 79–97)
Monocytes Absolute: 0.7 10*3/uL (ref 0.1–0.9)
Monocytes: 10 %
NEUTROS ABS: 4.5 10*3/uL (ref 1.4–7.0)
Neutrophils: 64 %
PLATELETS: 205 10*3/uL (ref 150–450)
RBC: 4.7 x10E6/uL (ref 3.77–5.28)
RDW: 13.4 % (ref 12.3–15.4)
WBC: 6.9 10*3/uL (ref 3.4–10.8)

## 2018-03-18 LAB — LIPID PANEL W/O CHOL/HDL RATIO
Cholesterol, Total: 185 mg/dL (ref 100–199)
HDL: 57 mg/dL (ref >39)
LDL CALC: 105 mg/dL — AB (ref 0–99)
Triglycerides: 117 mg/dL (ref 0–149)
VLDL CHOLESTEROL CAL: 23 mg/dL (ref 5–40)

## 2018-03-18 LAB — TSH: TSH: 2.45 u[IU]/mL (ref 0.450–4.500)

## 2018-03-18 LAB — CA 125: Cancer Antigen (CA) 125: 13 U/mL (ref 0.0–38.1)

## 2018-03-18 MED ORDER — HYDROCHLOROTHIAZIDE 12.5 MG PO TABS: 12.5000 mg | ORAL_TABLET | Freq: Every day | ORAL | 0 refills | Status: DC

## 2018-03-20 NOTE — Assessment & Plan Note (Signed)
Followed by Cardiology, on pacemaker

## 2018-03-20 NOTE — Assessment & Plan Note (Signed)
Stable and WNL, continue current regimen 

## 2018-03-20 NOTE — Assessment & Plan Note (Signed)
Recheck lipids, adjust as needed. Continue current regimen 

## 2018-03-20 NOTE — Assessment & Plan Note (Signed)
Recheck TSH, adjust if needed

## 2018-03-20 NOTE — Assessment & Plan Note (Signed)
Followed by Cardiology, euvolemic today. Continue current regimen including sodium restriction

## 2018-03-20 NOTE — Assessment & Plan Note (Signed)
Check CA125 today

## 2018-04-15 ENCOUNTER — Other Ambulatory Visit: Payer: Self-pay | Admitting: Family Medicine

## 2018-05-03 ENCOUNTER — Ambulatory Visit (INDEPENDENT_AMBULATORY_CARE_PROVIDER_SITE_OTHER): Payer: BLUE CROSS/BLUE SHIELD

## 2018-05-03 ENCOUNTER — Other Ambulatory Visit: Payer: Self-pay | Admitting: Unknown Physician Specialty

## 2018-05-03 DIAGNOSIS — I442 Atrioventricular block, complete: Secondary | ICD-10-CM

## 2018-05-03 DIAGNOSIS — I428 Other cardiomyopathies: Secondary | ICD-10-CM

## 2018-05-03 NOTE — Telephone Encounter (Signed)
Requested medication (s) are due for refill today: yes  Requested medication (s) are on the active medication list: yes  Last refill:  02/04/18  Future visit scheduled: yes  Notes to clinic:  Unable to refill per protocol. No recent digioxin level    Requested Prescriptions  Pending Prescriptions Disp Refills   digoxin (LANOXIN) 0.125 MG tablet [Pharmacy Med Name: DIGOXIN 125 MCG TABLET] 90 tablet 0    Sig: TAKE 1 TABLET (0.125 MG TOTAL) BY MOUTH DAILY.     Cardiovascular:  Antiarrhythmic Agents - digoxin Failed - 05/03/2018  7:04 PM      Failed - Digoxin (serum) in normal range and within 180 days    Digoxin, Serum  Date Value Ref Range Status  03/05/2015 0.5 (L) 0.9 - 2.0 ng/mL Final    Comment:    Concentrations above 2.0 ng/mL are generally considered toxic. Some overlap of toxic and non-toxic values have been reported.                             Detection Limit = 0.4 ng/mL          Failed - K in normal range and within 180 days    Potassium  Date Value Ref Range Status  03/17/2018 5.5 (H) 3.5 - 5.2 mmol/L Final         Passed - Cr in normal range and within 180 days    Creatinine, Ser  Date Value Ref Range Status  03/17/2018 0.87 0.57 - 1.00 mg/dL Final         Passed - Ca in normal range and within 180 days    Calcium  Date Value Ref Range Status  03/17/2018 9.9 8.7 - 10.3 mg/dL Final         Passed - Last Heart Rate in normal range    Pulse Readings from Last 1 Encounters:  03/17/18 68         Passed - Valid encounter within last 6 months    Recent Outpatient Visits          1 month ago Essential hypertension   [facility] Volney American, [place]   7 months ago Medication monitoring encounter   [facility] [name], NP   1 year ago Essential hypertension   [place], NP   1 year ago NICM (nonischemic cardiomyopathy) ([name])   [place], NP   2 years  ago Routine general medical examination at a health care facility   [facility] [name], NP      Future Appointments            In 4 months Orene Desanctis, Lilia Argue, [place], [place]

## 2018-05-04 NOTE — Progress Notes (Signed)
Remote pacemaker transmission.   

## 2018-05-05 LAB — CUP PACEART REMOTE DEVICE CHECK
Battery Remaining Longevity: 118 mo
Battery Remaining Percentage: 95.5 %
Battery Voltage: 2.98 V
Brady Statistic AP VP Percent: 12 %
Brady Statistic AP VS Percent: 1 %
Brady Statistic AS VP Percent: 88 %
Brady Statistic RA Percent Paced: 12 %
Brady Statistic RV Percent Paced: 99 %
Date Time Interrogation Session: 20200118081945
Implantable Lead Implant Date: 20160824
Implantable Lead Location: 753859
Implantable Lead Location: 753860
Implantable Pulse Generator Implant Date: 20160824
Lead Channel Impedance Value: 410 Ohm
Lead Channel Impedance Value: 560 Ohm
Lead Channel Pacing Threshold Amplitude: 0.5 V
Lead Channel Pacing Threshold Amplitude: 0.5 V
Lead Channel Pacing Threshold Pulse Width: 0.5 ms
Lead Channel Sensing Intrinsic Amplitude: 1.6 mV
Lead Channel Sensing Intrinsic Amplitude: 10.4 mV
Lead Channel Setting Pacing Amplitude: 1.5 V
Lead Channel Setting Pacing Pulse Width: 0.5 ms
Lead Channel Setting Sensing Sensitivity: 2.5 mV
MDC IDC LEAD IMPLANT DT: 20160824
MDC IDC MSMT LEADCHNL RV PACING THRESHOLD PULSEWIDTH: 0.5 ms
MDC IDC PG SERIAL: 7802897
MDC IDC SET LEADCHNL RV PACING AMPLITUDE: 2 V
MDC IDC STAT BRADY AS VS PERCENT: 1 %
Pulse Gen Model: 3222

## 2018-06-03 ENCOUNTER — Telehealth: Payer: Self-pay | Admitting: Unknown Physician Specialty

## 2018-06-03 NOTE — Telephone Encounter (Signed)
They are both for blood pressure, if she would like to be back on the benazepril I am happy to send that in instead since she's been stable on that in the past

## 2018-06-03 NOTE — Telephone Encounter (Signed)
That was what we had discussed and then to check her electrolytes after 2 weeks, but if she is not comfortable with trying it we can discuss other alternatives

## 2018-06-03 NOTE — Telephone Encounter (Signed)
Copied from [place] [phone]. Topic: Quick Communication - See Telephone Encounter >> Jun 03, 2018 10:37 AM Rayann Heman wrote: CRM for notification. See Telephone encounter for: 06/03/18.benazepril (LOTENSIN) 10 MG tablet [[id number]] pt called and stated that the pharmacy advised pt that she should not replace this medication hydrochlorothiazide (HYDRODIURIL) 12.5 MG tablet [[id number]]  because this is a fluid pill. Pt would like a call back after 1 if possible. Please advise

## 2018-06-03 NOTE — Telephone Encounter (Signed)
Patient states that she thought that the HCTZ was started because her potassium was high, she is not currently taking the HCTZ, she has picked it up, but not started it. She is taking the benazepril because she wanted to finish what she had a home. She states that she already uses the restroom a lot, without taking the HCTZ, please advise.

## 2018-06-04 NOTE — Telephone Encounter (Signed)
Called and spoke to patient and Rachel's message relayed. Patient stated that she was in the middle of something so she will call us back on Monday to let us know what she wants to do.

## 2018-08-02 ENCOUNTER — Other Ambulatory Visit: Payer: Self-pay | Admitting: Family Medicine

## 2018-08-02 ENCOUNTER — Other Ambulatory Visit: Payer: Self-pay

## 2018-08-02 ENCOUNTER — Ambulatory Visit (INDEPENDENT_AMBULATORY_CARE_PROVIDER_SITE_OTHER): Payer: BLUE CROSS/BLUE SHIELD | Admitting: *Deleted

## 2018-08-02 ENCOUNTER — Other Ambulatory Visit: Payer: Self-pay | Admitting: Unknown Physician Specialty

## 2018-08-02 DIAGNOSIS — I442 Atrioventricular block, complete: Secondary | ICD-10-CM

## 2018-08-02 DIAGNOSIS — I428 Other cardiomyopathies: Secondary | ICD-10-CM

## 2018-08-02 LAB — CUP PACEART REMOTE DEVICE CHECK
Date Time Interrogation Session: 20200420165925
Implantable Lead Implant Date: 20160824
Implantable Lead Implant Date: 20160824
Implantable Lead Location: 753859
Implantable Lead Location: 753860
Implantable Pulse Generator Implant Date: 20160824
Pulse Gen Model: 3222
Pulse Gen Serial Number: 7802897

## 2018-08-02 NOTE — Telephone Encounter (Signed)
Requested Prescriptions  Pending Prescriptions Disp Refills  . digoxin (LANOXIN) 0.125 MG tablet [Pharmacy Med Name: DIGOXIN 125 MCG TABLET] 90 tablet 0    Sig: TAKE 1 TABLET BY MOUTH DAILY     Cardiovascular:  Antiarrhythmic Agents - digoxin Failed - 08/02/2018 12:13 PM      Failed - Digoxin (serum) in normal range and within 180 days    Digoxin, Serum  Date Value Ref Range Status  03/05/2015 0.5 (L) 0.9 - 2.0 ng/mL Final    Comment:    Concentrations above 2.0 ng/mL are generally considered toxic. Some overlap of toxic and non-toxic values have been reported.                             Detection Limit = 0.4 ng/mL          Failed - K in normal range and within 180 days    Potassium  Date Value Ref Range Status  03/17/2018 5.5 (H) 3.5 - 5.2 mmol/L Final         Passed - Cr in normal range and within 180 days    Creatinine, Ser  Date Value Ref Range Status  03/17/2018 0.87 0.57 - 1.00 mg/dL Final         Passed - Ca in normal range and within 180 days    Calcium  Date Value Ref Range Status  03/17/2018 9.9 8.7 - 10.3 mg/dL Final         Passed - Last Heart Rate in normal range    Pulse Readings from Last 1 Encounters:  03/17/18 68         Passed - Valid encounter within last 6 months    Recent Outpatient Visits          4 months ago Essential hypertension   [facility] Volney American, [place]   10 months ago Medication monitoring encounter   [facility] [name], NP   1 year ago Essential hypertension   [place], [place]   2 years ago NICM (nonischemic cardiomyopathy) ([place])   [place], NP   2 years ago Routine general medical examination at a health care facility   [facility] [name], NP      Future Appointments            In 46 month Orene Desanctis, Lilia Argue, [place], [place]

## 2018-08-05 ENCOUNTER — Telehealth: Payer: Self-pay | Admitting: Unknown Physician Specialty

## 2018-08-05 NOTE — Telephone Encounter (Signed)
Please let pharmacy know verbal change is okay.  We can utilize what they have at their pharmacy.

## 2018-08-05 NOTE — Telephone Encounter (Signed)
LVM for patient to give [place] pharmacy information and we would get this to the CMA to change

## 2018-08-05 NOTE — Telephone Encounter (Signed)
Tiffany with [facility], calling on behalf of patient. She states that patient would like her medications sent to [facility] on file- She also stated that because of transfer, the brand of the medication levothyroxine (SYNTHROID, LEVOTHROID) 112 MCG tablet would need to be changed, she is just needing a verbal ok from provider.      [facility] (N), [place] [phone] (Phone) [phone] (Fax)

## 2018-08-05 NOTE — Telephone Encounter (Signed)
Routing to provider, pharmacy updated in chart. Also request for verbal change on levothyroxine. Please advise.

## 2018-08-05 NOTE — Telephone Encounter (Signed)
Patient wants a call back to discuss moving her medications to another pharmacy.

## 2018-08-05 NOTE — Telephone Encounter (Signed)
Tried calling pharmacy, kept going back to the automated system. Will try to call again shortly.

## 2018-08-05 NOTE — Telephone Encounter (Signed)
Pharmacy notified that [facility] gave the verbal ok to change levothyroxine brand.

## 2018-08-11 ENCOUNTER — Encounter: Payer: Self-pay | Admitting: Cardiology

## 2018-08-11 NOTE — Progress Notes (Signed)
Remote pacemaker transmission.   

## 2018-09-01 ENCOUNTER — Other Ambulatory Visit: Payer: Self-pay | Admitting: Unknown Physician Specialty

## 2018-09-08 ENCOUNTER — Ambulatory Visit (INDEPENDENT_AMBULATORY_CARE_PROVIDER_SITE_OTHER): Payer: BLUE CROSS/BLUE SHIELD | Admitting: Family Medicine

## 2018-09-08 ENCOUNTER — Encounter: Payer: Self-pay | Admitting: Family Medicine

## 2018-09-08 ENCOUNTER — Other Ambulatory Visit: Payer: Self-pay

## 2018-09-08 VITALS — Temp 98.6°F | Wt 187.7 lb

## 2018-09-08 DIAGNOSIS — I5022 Chronic systolic (congestive) heart failure: Secondary | ICD-10-CM | POA: Diagnosis not present

## 2018-09-08 DIAGNOSIS — F5101 Primary insomnia: Secondary | ICD-10-CM

## 2018-09-08 DIAGNOSIS — E7800 Pure hypercholesterolemia, unspecified: Secondary | ICD-10-CM | POA: Diagnosis not present

## 2018-09-08 DIAGNOSIS — I739 Peripheral vascular disease, unspecified: Secondary | ICD-10-CM | POA: Diagnosis not present

## 2018-09-08 DIAGNOSIS — E039 Hypothyroidism, unspecified: Secondary | ICD-10-CM

## 2018-09-08 DIAGNOSIS — I10 Essential (primary) hypertension: Secondary | ICD-10-CM

## 2018-09-08 NOTE — Progress Notes (Signed)
Temp 98.6 F (37 C) (Oral)   Wt 187 lb 11.2 oz (85.1 kg)   LMP  (LMP Unknown)   BMI 29.81 kg/m    Subjective:    Patient ID: [name] [name], female    DOB: October 28, 1955, 63 y.o.   MRN: [id number]  HPI: [name] [name] is a 63 y.o. female  Chief Complaint  Patient presents with  . Follow-up  . Congestive Heart Failure  . Hypothyroidism  . Insomnia    . This visit was completed via WebEx due to the restrictions of the COVID-19 pandemic. All issues as above were discussed and addressed. Physical exam was done as above through visual confirmation on WebEx. If it was felt that the patient should be evaluated in the office, they were directed there. The patient verbally consented to this visit. . Location of the patient: home . Location of the provider: home . Those involved with this call:  . Provider: Merrie Roof, PA-C . CMA: Yvonna Alanis, [place] . Front Desk/Registration: Jill Side  . Time spent on call: 25 minutes with patient face to face via video conference. More than 50% of this time was spent in counseling and coordination of care. 5 minutes total spent in review of patient's record and preparation of their chart. I verified patient identity using two factors (patient name and date of birth). Patient consents verbally to being seen via telemedicine visit today.   Patient presenting today for 6 month chronic condition follow up. No new concerns, feels things are going very well. Taking all medicines faithfully without side effects.   Followed by Cardiology for cardiomyopathy, CHF, AV block and taking digoxin. On low dose benazepril for blood pressure control which seems to be controlling things well. Has not been checking readings at home lately but feeling well without CP, SOB, palpitations, dizziness, syncope.   Taking lovastatin for PAD and hyperlipidemia. No myalgias, claudication.   Taking synthroid 112 mcg for hypothyroidism. Last TSH 6 months ago WNL.   Sleeping  well, no longer needing klonopin prn. No concerns.   Relevant past medical, surgical, family and social history reviewed and updated as indicated. Interim medical history since our last visit reviewed. Allergies and medications reviewed and updated.  Review of Systems  Per HPI unless specifically indicated above     Objective:    Temp 98.6 F (37 C) (Oral)   Wt 187 lb 11.2 oz (85.1 kg)   LMP  (LMP Unknown)   BMI 29.81 kg/m   Wt Readings from Last 3 Encounters:  09/08/18 187 lb 11.2 oz (85.1 kg)  03/17/18 190 lb (86.2 kg)  09/08/17 182 lb 3.2 oz (82.6 kg)    Physical Exam Vitals signs and nursing note reviewed.  Constitutional:      General: She is not in acute distress.    Appearance: Normal appearance.  HENT:     Head: Atraumatic.     Right Ear: External ear normal.     Left Ear: External ear normal.     Nose: Nose normal. No congestion.     Mouth/Throat:     Mouth: Mucous membranes are moist.     Pharynx: Oropharynx is clear. No posterior oropharyngeal erythema.  Eyes:     Extraocular Movements: Extraocular movements intact.     Conjunctiva/sclera: Conjunctivae normal.  Neck:     Musculoskeletal: Normal range of motion.  Cardiovascular:     Comments: Unable to assess via virtual visit Pulmonary:     Effort: Pulmonary effort  is normal. No respiratory distress.  Musculoskeletal: Normal range of motion.  Skin:    General: Skin is dry.     Findings: No erythema.  Neurological:     Mental Status: She is alert and oriented to person, place, and time.  Psychiatric:        Mood and Affect: Mood normal.        Thought Content: Thought content normal.        Judgment: Judgment normal.     Results for orders placed or performed in visit on 08/02/18  CUP PACEART REMOTE DEVICE CHECK  Result Value Ref Range   Pulse Generator Manufacturer [facility]    Date Time Interrogation Session [id number]    Pulse Gen Model 3222 Allure RF    Pulse Gen Serial Number [id number]     Clinic Name [place]    Implantable Pulse Generator Type Cardiac Resynch Therapy Pacemaker    Implantable Pulse Generator Implant Date [id number]    Implantable Lead Manufacturer [facility]    Implantable Lead Model 1688TC Tendril SDX    Implantable Lead Serial Number [id number]    Implantable Lead Implant Date [id number]    Implantable Lead Location [id number]    Implantable Lead Manufacturer [facility]    Implantable Lead Model 1688TC Tendril SDX    Implantable Lead Serial Number [id number]    Implantable Lead Implant Date [id number]    Implantable Lead Location Detail 1 UNKNOWN    Implantable Lead Location [id number]       Assessment & Plan:   Problem List Items Addressed This Visit      Cardiovascular and Mediastinum   Peripheral artery disease (HCC)    Recheck lipids, continue current regimen      Essential hypertension - Primary    Will check BP and HR when patient presents to clinic for lab appt in near future. Adjust if needed based on reading. Encouraged good home monitoring of BPs. Continue current regimen      Relevant Orders   Comprehensive metabolic panel   Chronic systolic CHF (congestive heart failure) (HCC)    Stable, followed by Cardiology. Continue current regimen        Endocrine   Hypothyroidism    Stable on current dose, continue current regimen        Other   Insomnia    Stable off medications, continue good sleep hygiene      Hypercholesteremia    Recheck lipids, adjust as needed. Continue good diet and exercise modifications      Relevant Orders   Lipid Panel w/o Chol/HDL Ratio       Follow up plan: Return in about 6 months (around 03/11/2019) for CPE.

## 2018-09-09 ENCOUNTER — Other Ambulatory Visit: Payer: BLUE CROSS/BLUE SHIELD

## 2018-09-09 ENCOUNTER — Other Ambulatory Visit: Payer: Self-pay | Admitting: Family Medicine

## 2018-09-09 VITALS — BP 130/84 | HR 75 | Temp 98.6°F

## 2018-09-09 DIAGNOSIS — I739 Peripheral vascular disease, unspecified: Secondary | ICD-10-CM | POA: Diagnosis not present

## 2018-09-09 DIAGNOSIS — I10 Essential (primary) hypertension: Secondary | ICD-10-CM

## 2018-09-09 DIAGNOSIS — E7800 Pure hypercholesterolemia, unspecified: Secondary | ICD-10-CM | POA: Diagnosis not present

## 2018-09-09 DIAGNOSIS — Z8543 Personal history of malignant neoplasm of ovary: Secondary | ICD-10-CM

## 2018-09-09 DIAGNOSIS — I5022 Chronic systolic (congestive) heart failure: Secondary | ICD-10-CM | POA: Diagnosis not present

## 2018-09-09 DIAGNOSIS — E039 Hypothyroidism, unspecified: Secondary | ICD-10-CM | POA: Diagnosis not present

## 2018-09-09 DIAGNOSIS — F5101 Primary insomnia: Secondary | ICD-10-CM | POA: Diagnosis not present

## 2018-09-13 LAB — COMPREHENSIVE METABOLIC PANEL
ALT: 28 IU/L (ref 0–32)
AST: 27 IU/L (ref 0–40)
Albumin/Globulin Ratio: 1.7 (ref 1.2–2.2)
Albumin: 4.6 g/dL (ref 3.8–4.9)
Alkaline Phosphatase: 103 IU/L (ref 39–117)
BUN/Creatinine Ratio: 16 (ref 12–28)
BUN: 12 mg/dL (ref 8–27)
Bilirubin Total: 0.3 mg/dL (ref 0.0–1.2)
CO2: 21 mmol/L (ref 20–29)
Calcium: 9.7 mg/dL (ref 8.7–10.3)
Chloride: 98 mmol/L (ref 96–106)
Creatinine, Ser: 0.73 mg/dL (ref 0.57–1.00)
GFR calc Af Amer: 104 mL/min/{1.73_m2} (ref >59)
GFR calc non Af Amer: 90 mL/min/{1.73_m2} (ref >59)
Globulin, Total: 2.7 g/dL (ref 1.5–4.5)
Glucose: 98 mg/dL (ref 65–99)
Potassium: 5.1 mmol/L (ref 3.5–5.2)
Sodium: 136 mmol/L (ref 134–144)
Total Protein: 7.3 g/dL (ref 6.0–8.5)

## 2018-09-13 LAB — LIPID PANEL W/O CHOL/HDL RATIO
Cholesterol, Total: 194 mg/dL (ref 100–199)
HDL: 55 mg/dL (ref >39)
LDL Calculated: 115 mg/dL — ABNORMAL HIGH (ref 0–99)
Triglycerides: 121 mg/dL (ref 0–149)
VLDL Cholesterol Cal: 24 mg/dL (ref 5–40)

## 2018-09-13 LAB — CA 125: Cancer Antigen (CA) 125: 11.9 U/mL (ref 0.0–38.1)

## 2018-09-13 NOTE — Assessment & Plan Note (Signed)
Will check BP and HR when patient presents to clinic for lab appt in near future. Adjust if needed based on reading. Encouraged good home monitoring of BPs. Continue current regimen

## 2018-09-13 NOTE — Assessment & Plan Note (Signed)
Stable off medications, continue good sleep hygiene

## 2018-09-13 NOTE — Assessment & Plan Note (Signed)
Stable on current dose, continue current regimen

## 2018-09-13 NOTE — Assessment & Plan Note (Signed)
Stable, followed by Cardiology. Continue current regimen

## 2018-09-13 NOTE — Assessment & Plan Note (Signed)
Recheck lipids, adjust as needed. Continue good diet and exercise modifications

## 2018-09-13 NOTE — Assessment & Plan Note (Signed)
Recheck lipids, continue current regimen 

## 2018-09-14 ENCOUNTER — Telehealth: Payer: Self-pay | Admitting: Family Medicine

## 2018-09-14 NOTE — Telephone Encounter (Signed)
Pt called in to schedule appt. She is wanting to inquire about her results from last appt. Please advise.

## 2018-09-15 NOTE — Telephone Encounter (Signed)
Results released via mychart - ok to read off message to patient

## 2018-09-15 NOTE — Telephone Encounter (Signed)
Message relayed to patient. Verbalized understanding and denied questions.   

## 2018-09-16 ENCOUNTER — Ambulatory Visit: Payer: BLUE CROSS/BLUE SHIELD | Admitting: Family Medicine

## 2018-09-26 ENCOUNTER — Other Ambulatory Visit: Payer: Self-pay | Admitting: Unknown Physician Specialty

## 2018-09-27 NOTE — Telephone Encounter (Signed)
Last refilled 1 year ago

## 2018-10-31 ENCOUNTER — Other Ambulatory Visit: Payer: Self-pay | Admitting: Nurse Practitioner

## 2018-10-31 DIAGNOSIS — I428 Other cardiomyopathies: Secondary | ICD-10-CM

## 2018-10-31 NOTE — Telephone Encounter (Signed)
Requested medication (s) are due for refill today: yes  Requested medication (s) are on the active medication list: yes  Last refill:  08/02/18  Future visit scheduled: yes  Notes to clinic:  Overdue for serum digoxin level   Requested Prescriptions  Pending Prescriptions Disp Refills   digoxin (LANOXIN) 0.125 MG tablet [Pharmacy Med Name: Digoxin 125 MCG Oral Tablet] 90 tablet 0    Sig: Take 1 tablet by mouth once daily     Cardiovascular:  Antiarrhythmic Agents - digoxin Failed - 10/31/2018 12:05 PM      Failed - Digoxin (serum) in normal range and within 180 days    Digoxin, Serum  Date Value Ref Range Status  03/05/2015 0.5 (L) 0.9 - 2.0 ng/mL Final    Comment:    Concentrations above 2.0 ng/mL are generally considered toxic. Some overlap of toxic and non-toxic values have been reported.                             Detection Limit = 0.4 ng/mL          Passed - Cr in normal range and within 180 days    Creatinine, Ser  Date Value Ref Range Status  09/09/2018 0.73 0.57 - 1.00 mg/dL Final         Passed - Ca in normal range and within 180 days    Calcium  Date Value Ref Range Status  09/09/2018 9.7 8.7 - 10.3 mg/dL Final         Passed - K in normal range and within 180 days    Potassium  Date Value Ref Range Status  09/09/2018 5.1 3.5 - 5.2 mmol/L Final         Passed - Last Heart Rate in normal range    Pulse Readings from Last 1 Encounters:  09/09/18 75         Passed - Valid encounter within last 6 months    Recent Outpatient Visits          1 month ago Essential hypertension   [facility] Volney American, [place]   7 months ago Essential hypertension   [facility] Volney American, [place]   1 year ago Medication monitoring encounter   [facility] [name], NP   1 year ago Essential hypertension   [place], [place], NP   2 years ago NICM (nonischemic cardiomyopathy) [facility])   [place], [place], NP      Future Appointments            In 4 months Orene Desanctis, Lilia Argue, [place], [facility]

## 2018-11-01 ENCOUNTER — Ambulatory Visit (INDEPENDENT_AMBULATORY_CARE_PROVIDER_SITE_OTHER): Payer: BC Managed Care – PPO | Admitting: *Deleted

## 2018-11-01 DIAGNOSIS — I442 Atrioventricular block, complete: Secondary | ICD-10-CM | POA: Diagnosis not present

## 2018-11-01 LAB — CUP PACEART REMOTE DEVICE CHECK
Date Time Interrogation Session: 20200720165451
Implantable Lead Implant Date: 20160824
Implantable Lead Implant Date: 20160824
Implantable Lead Location: 753859
Implantable Lead Location: 753860
Implantable Pulse Generator Implant Date: 20160824
Pulse Gen Model: 3222
Pulse Gen Serial Number: 7802897

## 2018-11-17 ENCOUNTER — Encounter: Payer: Self-pay | Admitting: Cardiology

## 2018-11-17 NOTE — Progress Notes (Signed)
Remote pacemaker transmission.   

## 2018-12-27 ENCOUNTER — Telehealth: Payer: Self-pay | Admitting: Family Medicine

## 2018-12-27 MED ORDER — CLONAZEPAM 0.5 MG PO TABS: 0.5000 mg | ORAL_TABLET | Freq: Every day | ORAL | 0 refills | Status: DC | PRN

## 2018-12-27 NOTE — Telephone Encounter (Signed)
Per my last visit with her she is no longer using the klonopin

## 2018-12-27 NOTE — Telephone Encounter (Signed)
Routing to provider  

## 2018-12-27 NOTE — Telephone Encounter (Signed)
Will refill 30 tabs

## 2018-12-27 NOTE — Telephone Encounter (Signed)
Patient notified

## 2018-12-27 NOTE — Telephone Encounter (Signed)
Called and spoke with patient, she states that she is still taking the klonopin, she takes it very rare and 30 pills has lasted fer for a year. She is currently moving and about to go visit her son in [place], so she just needs them to take the edge off.

## 2018-12-27 NOTE — Telephone Encounter (Signed)
Medication Refill - Medication: clonazePAM (KLONOPIN) 0.5 MG tablet   Has the patient contacted their pharmacy? Yes.   (Agent: If no, request that the patient contact the pharmacy for the refill.) (Agent: If yes, when and what did the pharmacy advise?)  Preferred Pharmacy (with phone number or street name):  [facility] (N), [place] - [address] ([place]) [place] [address]  Phone: [phone] Fax: [phone]     Agent: Please be advised that RX refills may take up to 3 business days. We ask that you follow-up with your pharmacy.

## 2019-01-31 ENCOUNTER — Other Ambulatory Visit: Payer: Self-pay | Admitting: Family Medicine

## 2019-01-31 ENCOUNTER — Ambulatory Visit (INDEPENDENT_AMBULATORY_CARE_PROVIDER_SITE_OTHER): Payer: BC Managed Care – PPO | Admitting: *Deleted

## 2019-01-31 DIAGNOSIS — E039 Hypothyroidism, unspecified: Secondary | ICD-10-CM

## 2019-01-31 DIAGNOSIS — I442 Atrioventricular block, complete: Secondary | ICD-10-CM | POA: Diagnosis not present

## 2019-01-31 DIAGNOSIS — I5022 Chronic systolic (congestive) heart failure: Secondary | ICD-10-CM

## 2019-02-01 DIAGNOSIS — D2261 Melanocytic nevi of right upper limb, including shoulder: Secondary | ICD-10-CM | POA: Diagnosis not present

## 2019-02-01 DIAGNOSIS — D2271 Melanocytic nevi of right lower limb, including hip: Secondary | ICD-10-CM | POA: Diagnosis not present

## 2019-02-01 DIAGNOSIS — D2262 Melanocytic nevi of left upper limb, including shoulder: Secondary | ICD-10-CM | POA: Diagnosis not present

## 2019-02-01 DIAGNOSIS — D225 Melanocytic nevi of trunk: Secondary | ICD-10-CM | POA: Diagnosis not present

## 2019-02-01 LAB — CUP PACEART REMOTE DEVICE CHECK
Battery Remaining Longevity: 109 mo
Battery Remaining Percentage: 95.5 %
Battery Voltage: 2.96 V
Brady Statistic AP VP Percent: 9.7 %
Brady Statistic AP VS Percent: 1 %
Brady Statistic AS VP Percent: 90 %
Brady Statistic AS VS Percent: 1 %
Brady Statistic RA Percent Paced: 9.5 %
Brady Statistic RV Percent Paced: 99 %
Date Time Interrogation Session: 20201019061540
Implantable Lead Implant Date: 20160824
Implantable Lead Implant Date: 20160824
Implantable Lead Location: 753859
Implantable Lead Location: 753860
Implantable Pulse Generator Implant Date: 20160824
Lead Channel Impedance Value: 390 Ohm
Lead Channel Impedance Value: 530 Ohm
Lead Channel Pacing Threshold Amplitude: 0.5 V
Lead Channel Pacing Threshold Amplitude: 0.5 V
Lead Channel Pacing Threshold Pulse Width: 0.5 ms
Lead Channel Pacing Threshold Pulse Width: 0.5 ms
Lead Channel Sensing Intrinsic Amplitude: 1.8 mV
Lead Channel Sensing Intrinsic Amplitude: 9.2 mV
Lead Channel Setting Pacing Amplitude: 1.5 V
Lead Channel Setting Pacing Amplitude: 2 V
Lead Channel Setting Pacing Pulse Width: 0.5 ms
Lead Channel Setting Sensing Sensitivity: 2.5 mV
Pulse Gen Model: 3222
Pulse Gen Serial Number: 7802897

## 2019-02-15 ENCOUNTER — Other Ambulatory Visit: Payer: Self-pay | Admitting: Family Medicine

## 2019-02-15 DIAGNOSIS — Z1231 Encounter for screening mammogram for malignant neoplasm of breast: Secondary | ICD-10-CM

## 2019-02-21 NOTE — Progress Notes (Signed)
Remote pacemaker transmission.   

## 2019-03-08 ENCOUNTER — Ambulatory Visit
Admission: RE | Admit: 2019-03-08 | Discharge: 2019-03-08 | Disposition: A | Discharge status: Home / Self Care | Payer: BC Managed Care – PPO | Source: Ambulatory Visit | Attending: Family Medicine | Admitting: Family Medicine

## 2019-03-08 DIAGNOSIS — Z1231 Encounter for screening mammogram for malignant neoplasm of breast: Secondary | ICD-10-CM | POA: Diagnosis not present

## 2019-03-16 ENCOUNTER — Ambulatory Visit (INDEPENDENT_AMBULATORY_CARE_PROVIDER_SITE_OTHER): Payer: BC Managed Care – PPO | Admitting: Family Medicine

## 2019-03-16 ENCOUNTER — Encounter: Payer: Self-pay | Admitting: Family Medicine

## 2019-03-16 ENCOUNTER — Other Ambulatory Visit: Payer: Self-pay

## 2019-03-16 VITALS — BP 115/78 | HR 79 | Temp 98.3°F | Ht 64.5 in | Wt 186.2 lb

## 2019-03-16 DIAGNOSIS — E039 Hypothyroidism, unspecified: Secondary | ICD-10-CM | POA: Diagnosis not present

## 2019-03-16 DIAGNOSIS — E7800 Pure hypercholesterolemia, unspecified: Secondary | ICD-10-CM

## 2019-03-16 DIAGNOSIS — Z0000 Encounter for general adult medical examination without abnormal findings: Secondary | ICD-10-CM | POA: Diagnosis not present

## 2019-03-16 DIAGNOSIS — I10 Essential (primary) hypertension: Secondary | ICD-10-CM

## 2019-03-16 DIAGNOSIS — I5022 Chronic systolic (congestive) heart failure: Secondary | ICD-10-CM

## 2019-03-16 DIAGNOSIS — I428 Other cardiomyopathies: Secondary | ICD-10-CM | POA: Diagnosis not present

## 2019-03-16 DIAGNOSIS — Z8543 Personal history of malignant neoplasm of ovary: Secondary | ICD-10-CM

## 2019-03-16 LAB — UA/M W/RFLX CULTURE, ROUTINE
Bilirubin, UA: NEGATIVE
Glucose, UA: NEGATIVE
Ketones, UA: NEGATIVE
Leukocytes,UA: NEGATIVE
Nitrite, UA: NEGATIVE
Protein,UA: NEGATIVE
Specific Gravity, UA: 1.01 (ref 1.005–1.030)
Urobilinogen, Ur: 0.2 mg/dL (ref 0.2–1.0)
pH, UA: 5.5 (ref 5.0–7.5)

## 2019-03-16 LAB — MICROSCOPIC EXAMINATION

## 2019-03-16 NOTE — Progress Notes (Signed)
BP 115/78   Pulse 79   Temp 98.3 F (36.8 C)   Ht 5' 4.5" (1.638 m)   Wt 186 lb 3.2 oz (84.5 kg)   LMP  (LMP Unknown)   SpO2 96%   BMI 31.47 kg/m    Subjective:    Patient ID: [name] [name], female    DOB: 1955/10/31, 63 y.o.   MRN: [id number]  HPI: [name] [name] is a 63 y.o. female presenting on 03/16/2019 for comprehensive medical examination. Current medical complaints include:see below  HTN - not checking home BPs. Taking medication faithfully without side effects. Denies CP, SOB, HAs, dizziness. Staying active and eating a healthy diet.   CHF, CAD, complete AV block followed by Cardiology. Pacemaker in place. No edema, DOE, CP.   Hypothyroidism on synthroid, tolerating well without any obvious sxs.   HLD on lovastatin. Denies myalgias, claudication.   She currently lives with: Menopausal Symptoms: no  Depression Screen done today and results listed below:  Depression screen [facility] 2/9 03/16/2019 09/08/2018 03/17/2018 02/13/2017 07/25/2016  Decreased Interest 0 0 0 0 0  Down, Depressed, Hopeless 0 0 0 0 0  PHQ - 2 Score 0 0 0 0 0  Altered sleeping 0 0 0 1 1  Tired, decreased energy 0 0 0 0 0  Change in appetite 0 0 0 0 0  Feeling bad or failure about yourself  0 0 0 0 0  Trouble concentrating 0 0 0 0 0  Moving slowly or fidgety/restless 0 0 0 0 0  Suicidal thoughts 0 0 0 0 0  PHQ-9 Score 0 0 0 1 1  Difficult doing work/chores Not difficult at all - - - -    The patient does not have a history of falls. I did complete a risk assessment for falls. A plan of care for falls was documented.   Past Medical History:  Past Medical History:  Diagnosis Date  . Anxiety    takes Klonopin daily as needed  . Arthritis   . Back pain    reason unknown  . Chronic systolic CHF (congestive heart failure) ([place])    a. echo 10/2013: EF 40-45 (apical images 35-40%), diff HK, mild MR, PASP nl; b. echo 12/2014: EF 35-40%, no RWMA, GR1DD, mod TR, PASP nl, small pericardial effusion circum  to the heart, no hemo compromise.  . 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 s/p pacemaker insertion 1996; b. s/p contralateral side [employer] Jude generator change 11/2014.  . Dizziness    occasionally  . GERD (gastroesophageal reflux disease)    takes Zantac daily as needed  . Gout   . Headache    occasionally  . History of blood clots    in heart-was on Coumadin---this many yrs ago  . History of colon polyps    benign  . Hyperlipidemia    takes Lovastatin daily  . Hypertension    hx of-since hip replacement MD took resident off meds 10/30/14 b/c well controlled  . Hypothyroid    takes Synthroid daily  . Joint pain   . Joint swelling   . NICM (nonischemic cardiomyopathy) ([place])    a. tachycardia induced->EF 40-45% (2010), 35-40% (10/2013), 35-40% Gr1 DD, mod TR (12/2014);  b. 01/2015 Cath: Nl cors, EF 30-35%, glob HK.  . Numbness and tingling    left leg and right arm  . Ovarian cancer (HCC)    ovarian, skin   . 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 Peripheral artery disease ([place])    stent on lt illiac    Surgical History:  Past Surgical History:  Procedure Laterality Date  . ABDOMINAL HYSTERECTOMY    . ANGIOPLASTY / STENTING ILIAC    . CARDIAC CATHETERIZATION N/A 02/08/2015   Procedure: Left Heart Cath and Coronary Angiography;  Surgeon: Wellington Hampshire, MD;  Location: [facility];  Service: Cardiovascular;  Laterality: N/A;  . CERVICAL CERCLAGE  47yrs ago  . COLONOSCOPY    . DG BARIUM SWALLOW ([place] HX)    . DILATION AND CURETTAGE OF UTERUS    . EP IMPLANTABLE DEVICE Left 12/06/2014   Procedure: Implantation of cardiac resynchronization therapy pacemaker;  Surgeon: Evans Lance, MD;  Location: [facility];  Service: Cardiovascular;  Laterality: Left;  . HERNIA MESH REMOVAL Right    incisional  . HERNIA REPAIR    . ILIAC ARTERY STENT  Apr 05, 2010   Left artery stenting  . INSERT / REPLACE / REMOVE  PACEMAKER    . JOINT REPLACEMENT  October 31, 2014   hip  . KNEE SURGERY Right   . PACEMAKER INSERTION  2012   Had replaced in July 2012, first placed in 1996  . PACEMAKER LEAD REMOVAL Right 12/06/2014   Procedure: REMOVAL OF RV AND RA PACEMAKER LEADS;  Surgeon: Evans Lance, MD;  Location: [facility];  Service: Cardiovascular;  Laterality: Right;  DR. BARTLE TO BACK UP   . PACEMAKER PLACEMENT Left 12/06/14  . TOTAL ABDOMINAL HYSTERECTOMY W/ BILATERAL SALPINGOOPHORECTOMY  07/2008   Ovarian cancer  . TOTAL HIP ARTHROPLASTY Right 10/31/2014   Procedure: TOTAL HIP ARTHROPLASTY ANTERIOR APPROACH;  Surgeon: Hessie Knows, MD;  Location: [facility];  Service: Orthopedics;  Laterality: Right;    Medications:  Current Outpatient Medications on File Prior to Visit  Medication Sig  . aspirin EC 81 MG tablet Take 81 mg by mouth daily.  . benazepril (LOTENSIN) 5 MG tablet TAKE 1 TABLET BY MOUTH EVERY DAY  . clonazePAM (KLONOPIN) 0.5 MG tablet Take 1 tablet (0.5 mg total) by mouth daily as needed for anxiety (sleep).  Marland Kitchen digoxin (LANOXIN) 0.125 MG tablet Take 1 tablet by mouth once daily  . levothyroxine (SYNTHROID) 112 MCG tablet Take 1 tablet by mouth once daily  . lovastatin (MEVACOR) 20 MG tablet TAKE 1 TABLET BY MOUTH EVERYDAY AT BEDTIME   No current facility-administered medications on file prior to visit.     Allergies:  Allergies  Allergen Reactions  . Marshmallow [Althaea Officinalis]     Rash, hives, itching, SOB  . Meat Extract     Rash, hives itching, SOB.  Can tolerate some meats ONLY in moderation  . Other     Jello - Rash, hives, itching, SOB  . Contrast Media [Iodinated Diagnostic Agents]     unknown  . Latex     rash  . Nickel     rash    Social History:  Social History   Socioeconomic History  . Marital status: Married    Spouse name: Not on file  . Number of children: Not on file  . Years of education: Not on file  . Highest education level: Not on file  Occupational  History  . Not on file  Social Needs  . Financial resource strain: Not on file  . Food insecurity    Worry: Not on file    Inability: Not on file  . Transportation needs  Medical: Not on file    Non-medical: Not on file  Tobacco Use  . Smoking status: Former Smoker    Quit date: 07/17/2008    Years since quitting: 10.6  . Smokeless tobacco: Never Used  Substance and Sexual Activity  . Alcohol use: Yes    Alcohol/week: 20.0 standard drinks    Types: 20 Cans of beer per week    Comment: beer couple a day  . Drug use: No  . Sexual activity: Not Currently    Birth control/protection: Surgical  Lifestyle  . Physical activity    Days per week: Not on file    Minutes per session: Not on file  . Stress: Not on file  Relationships  . Social Herbalist on phone: Not on file    Gets together: Not on file    Attends religious service: Not on file    Active member of club or organization: Not on file    Attends meetings of clubs or organizations: Not on file    Relationship status: Not on file  . Intimate partner violence    Fear of current or ex partner: Not on file    Emotionally abused: Not on file    Physically abused: Not on file    Forced sexual activity: Not on file  Other Topics Concern  . Not on file  Social History Narrative  . Not on file   Social History   Tobacco Use  Smoking Status Former Smoker  . Quit date: 07/17/2008  . Years since quitting: 10.6  Smokeless Tobacco Never Used   Social History   Substance and Sexual Activity  Alcohol Use Yes  . Alcohol/week: 20.0 standard drinks  . Types: 20 Cans of beer per week   Comment: beer couple a day    Family History:  Family History  Problem Relation Age of Onset  . Cancer Father        died of lung cancer  . Lung cancer Father   . Diabetes Brother   . Hyperlipidemia Mother   . Polymyalgia rheumatica Mother   . Arrhythmia Son     Past medical history, surgical history, medications,  allergies, family history and social history reviewed with patient today and changes made to appropriate areas of the chart.   Review of Systems - General ROS: negative Psychological ROS: negative Ophthalmic ROS: negative ENT ROS: negative Allergy and Immunology ROS: negative Hematological and Lymphatic ROS: negative Endocrine ROS: negative Breast ROS: negative for breast lumps Respiratory ROS: no cough, shortness of breath, or wheezing Cardiovascular ROS: no chest pain or dyspnea on exertion Gastrointestinal ROS: no abdominal pain, change in bowel habits, or black or bloody stools Genito-Urinary ROS: no dysuria, trouble voiding, or hematuria Musculoskeletal ROS: negative Neurological ROS: no TIA or stroke symptoms Dermatological ROS: negative All other ROS negative except what is listed above and in the HPI.      Objective:    BP 115/78   Pulse 79   Temp 98.3 F (36.8 C)   Ht 5' 4.5" (1.638 m)   Wt 186 lb 3.2 oz (84.5 kg)   LMP  (LMP Unknown)   SpO2 96%   BMI 31.47 kg/m   Wt Readings from Last 3 Encounters:  03/16/19 186 lb 3.2 oz (84.5 kg)  09/08/18 187 lb 11.2 oz (85.1 kg)  03/17/18 190 lb (86.2 kg)    Physical Exam Vitals signs and nursing note reviewed.  Constitutional:      General:  She is not in acute distress.    Appearance: She is well-developed.  HENT:     Head: Atraumatic.     Right Ear: External ear normal.     Left Ear: External ear normal.     Nose: Nose normal.     Mouth/Throat:     Pharynx: No oropharyngeal exudate.  Eyes:     General: No scleral icterus.    Conjunctiva/sclera: Conjunctivae normal.     Pupils: Pupils are equal, round, and reactive to light.  Neck:     Musculoskeletal: Normal range of motion and neck supple.     Thyroid: No thyromegaly.  Cardiovascular:     Rate and Rhythm: Normal rate and regular rhythm.     Heart sounds: Normal heart sounds.  Pulmonary:     Effort: Pulmonary effort is normal. No respiratory distress.      Breath sounds: Normal breath sounds.  Chest:     Breasts:        Right: No mass, skin change or tenderness.        Left: No mass, skin change or tenderness.  Abdominal:     General: Bowel sounds are normal.     Palpations: Abdomen is soft. There is no mass.     Tenderness: There is no abdominal tenderness.  Genitourinary:    Comments: GU exam declined Musculoskeletal: Normal range of motion.        General: No tenderness.  Lymphadenopathy:     Cervical: No cervical adenopathy.     Upper Body:     Right upper body: No axillary adenopathy.     Left upper body: No axillary adenopathy.  Skin:    General: Skin is warm and dry.     Findings: No rash.  Neurological:     Mental Status: She is alert and oriented to person, place, and time.     Cranial Nerves: No cranial nerve deficit.  Psychiatric:        Behavior: Behavior normal.     Results for orders placed or performed in visit on 03/16/19  Microscopic Examination   URINE  Result Value Ref Range   WBC, UA 0-5 0 - 5 /hpf   RBC 0-2 0 - 2 /hpf   Epithelial Cells (non renal) 0-10 0 - 10 /hpf   Mucus, UA Present Not Estab.   Bacteria, UA Few (A) None seen/Few  CBC with Differential/Platelet out  Result Value Ref Range   WBC 5.0 3.4 - 10.8 x10E3/uL   RBC 4.78 3.77 - 5.28 x10E6/uL   Hemoglobin 15.1 11.1 - 15.9 g/dL   Hematocrit 45.0 34.0 - 46.6 %   MCV 94 79 - 97 fL   MCH 31.6 26.6 - 33.0 pg   MCHC 33.6 31.5 - 35.7 g/dL   RDW 13.4 11.7 - 15.4 %   Platelets 184 150 - 450 x10E3/uL   Neutrophils 58 Not Estab. %   Lymphs 27 Not Estab. %   Monocytes 10 Not Estab. %   Eos 4 Not Estab. %   Basos 1 Not Estab. %   Neutrophils Absolute 2.9 1.4 - 7.0 x10E3/uL   Lymphocytes Absolute 1.3 0.7 - 3.1 x10E3/uL   Monocytes Absolute 0.5 0.1 - 0.9 x10E3/uL   EOS (ABSOLUTE) 0.2 0.0 - 0.4 x10E3/uL   Basophils Absolute 0.1 0.0 - 0.2 x10E3/uL   Immature Granulocytes 0 Not Estab. %   Immature Grans (Abs) 0.0 0.0 - 0.1 x10E3/uL  Comprehensive  metabolic panel  Result Value Ref Range  Glucose 107 (H) 65 - 99 mg/dL   BUN 10 8 - 27 mg/dL   Creatinine, Ser 0.79 0.57 - 1.00 mg/dL   GFR calc non Af Amer 81 >59 mL/min/1.73   GFR calc Af Amer 93 >59 mL/min/1.73   BUN/Creatinine Ratio 13 12 - 28   Sodium 139 134 - 144 mmol/L   Potassium 4.5 3.5 - 5.2 mmol/L   Chloride 102 96 - 106 mmol/L   CO2 24 20 - 29 mmol/L   Calcium 9.1 8.7 - 10.3 mg/dL   Total Protein 6.7 6.0 - 8.5 g/dL   Albumin 4.0 3.8 - 4.8 g/dL   Globulin, Total 2.7 1.5 - 4.5 g/dL   Albumin/Globulin Ratio 1.5 1.2 - 2.2   Bilirubin Total 0.4 0.0 - 1.2 mg/dL   Alkaline Phosphatase 96 39 - 117 IU/L   AST 25 0 - 40 IU/L   ALT 24 0 - 32 IU/L  Lipid Panel w/o Chol/HDL Ratio out  Result Value Ref Range   Cholesterol, Total 169 100 - 199 mg/dL   Triglycerides 82 0 - 149 mg/dL   HDL 58 >39 mg/dL   VLDL Cholesterol Cal 15 5 - 40 mg/dL   LDL Chol Calc (NIH) 96 0 - 99 mg/dL  TSH  Result Value Ref Range   TSH 1.970 0.450 - 4.500 uIU/mL  UA/M w/rflx Culture, Routine   Specimen: Urine   URINE  Result Value Ref Range   Specific Gravity, UA 1.010 1.005 - 1.030   pH, UA 5.5 5.0 - 7.5   Color, UA Yellow Yellow   Appearance Ur Clear Clear   Leukocytes,UA Negative Negative   Protein,UA Negative Negative/Trace   Glucose, UA Negative Negative   Ketones, UA Negative Negative   RBC, UA Trace (A) Negative   Bilirubin, UA Negative Negative   Urobilinogen, Ur 0.2 0.2 - 1.0 mg/dL   Nitrite, UA Negative Negative   Microscopic Examination See below:       Assessment & Plan:   Problem List Items Addressed This Visit      Cardiovascular and Mediastinum   Essential hypertension - Primary    BPs stable and WNL, continue current regimen      Relevant Orders   CBC with Differential/Platelet out (Completed)   Comprehensive metabolic panel (Completed)   UA/M w/rflx Culture, Routine (Completed)   NICM (nonischemic cardiomyopathy) ([place])    Followed closely by Cardiology, continue  current regimen      Chronic systolic CHF (congestive heart failure) ([place])    Euvolemic and asymptomatic. Followed closely by Cardiology. Continue current regimen        Endocrine   Hypothyroidism   Relevant Orders   TSH (Completed)     Other   Hypercholesteremia    Recheck lipids, adjust as needed. Continue current regimen and lifestyle modifications      Relevant Orders   Lipid Panel w/o Chol/HDL Ratio out (Completed)   History of ovarian cancer    With stable CA 125 levels. Continue to monitor       Other Visit Diagnoses    Annual physical exam           Follow up plan: Return in about 6 months (around 09/14/2019) for 6 month f/u.   LABORATORY TESTING:  - Pap smear: not applicable  IMMUNIZATIONS:   - Tdap: Tetanus vaccination status reviewed: last tetanus booster within 10 years. - Influenza: Up to date  SCREENING: -Mammogram: Up to date  - Colonoscopy: Up to date  PATIENT COUNSELING:   Advised to take 1 mg of folate supplement per day if capable of pregnancy.   Sexuality: Discussed sexually transmitted diseases, partner selection, use of condoms, avoidance of unintended pregnancy  and contraceptive alternatives.   Advised to avoid cigarette smoking.  I discussed with the patient that most people either abstain from alcohol or drink within safe limits (<=14/week and <=4 drinks/occasion for males, <=7/weeks and <= 3 drinks/occasion for females) and that the risk for alcohol disorders and other health effects rises proportionally with the number of drinks per week and how often a drinker exceeds daily limits.  Discussed cessation/primary prevention of drug use and availability of treatment for abuse.   Diet: Encouraged to adjust caloric intake to maintain  or achieve ideal body weight, to reduce intake of dietary saturated fat and total fat, to limit sodium intake by avoiding high sodium foods and not adding table salt, and to maintain adequate dietary  potassium and calcium preferably from fresh fruits, vegetables, and low-fat dairy products.    stressed the importance of regular exercise  Injury prevention: Discussed safety belts, safety helmets, smoke detector, smoking near bedding or upholstery.   Dental health: Discussed importance of regular tooth brushing, flossing, and dental visits.    NEXT PREVENTATIVE PHYSICAL DUE IN 1 YEAR. Return in about 6 months (around 09/14/2019) for 6 month f/u.

## 2019-03-17 LAB — COMPREHENSIVE METABOLIC PANEL
ALT: 24 IU/L (ref 0–32)
AST: 25 IU/L (ref 0–40)
Albumin/Globulin Ratio: 1.5 (ref 1.2–2.2)
Albumin: 4 g/dL (ref 3.8–4.8)
Alkaline Phosphatase: 96 IU/L (ref 39–117)
BUN/Creatinine Ratio: 13 (ref 12–28)
BUN: 10 mg/dL (ref 8–27)
Bilirubin Total: 0.4 mg/dL (ref 0.0–1.2)
CO2: 24 mmol/L (ref 20–29)
Calcium: 9.1 mg/dL (ref 8.7–10.3)
Chloride: 102 mmol/L (ref 96–106)
Creatinine, Ser: 0.79 mg/dL (ref 0.57–1.00)
GFR calc Af Amer: 93 mL/min/{1.73_m2} (ref >59)
GFR calc non Af Amer: 81 mL/min/{1.73_m2} (ref >59)
Globulin, Total: 2.7 g/dL (ref 1.5–4.5)
Glucose: 107 mg/dL — ABNORMAL HIGH (ref 65–99)
Potassium: 4.5 mmol/L (ref 3.5–5.2)
Sodium: 139 mmol/L (ref 134–144)
Total Protein: 6.7 g/dL (ref 6.0–8.5)

## 2019-03-17 LAB — TSH: TSH: 1.97 u[IU]/mL (ref 0.450–4.500)

## 2019-03-17 LAB — LIPID PANEL W/O CHOL/HDL RATIO
Cholesterol, Total: 169 mg/dL (ref 100–199)
HDL: 58 mg/dL (ref >39)
LDL Chol Calc (NIH): 96 mg/dL (ref 0–99)
Triglycerides: 82 mg/dL (ref 0–149)
VLDL Cholesterol Cal: 15 mg/dL (ref 5–40)

## 2019-03-17 LAB — CBC WITH DIFFERENTIAL/PLATELET
Basophils Absolute: 0.1 10*3/uL (ref 0.0–0.2)
Basos: 1 %
EOS (ABSOLUTE): 0.2 10*3/uL (ref 0.0–0.4)
Eos: 4 %
Hematocrit: 45 % (ref 34.0–46.6)
Hemoglobin: 15.1 g/dL (ref 11.1–15.9)
Immature Grans (Abs): 0 10*3/uL (ref 0.0–0.1)
Immature Granulocytes: 0 %
Lymphocytes Absolute: 1.3 10*3/uL (ref 0.7–3.1)
Lymphs: 27 %
MCH: 31.6 pg (ref 26.6–33.0)
MCHC: 33.6 g/dL (ref 31.5–35.7)
MCV: 94 fL (ref 79–97)
Monocytes Absolute: 0.5 10*3/uL (ref 0.1–0.9)
Monocytes: 10 %
Neutrophils Absolute: 2.9 10*3/uL (ref 1.4–7.0)
Neutrophils: 58 %
Platelets: 184 10*3/uL (ref 150–450)
RBC: 4.78 x10E6/uL (ref 3.77–5.28)
RDW: 13.4 % (ref 11.7–15.4)
WBC: 5 10*3/uL (ref 3.4–10.8)

## 2019-03-20 NOTE — Assessment & Plan Note (Signed)
Followed closely by Cardiology, continue current regimen

## 2019-03-20 NOTE — Assessment & Plan Note (Signed)
BPs stable and WNL, continue current regimen 

## 2019-03-20 NOTE — Assessment & Plan Note (Signed)
Euvolemic and asymptomatic. Followed closely by Cardiology. Continue current regimen

## 2019-03-20 NOTE — Assessment & Plan Note (Signed)
Recheck lipids, adjust as needed. Continue current regimen and lifestyle modifications 

## 2019-03-20 NOTE — Assessment & Plan Note (Signed)
With stable CA 125 levels. Continue to monitor

## 2019-04-28 ENCOUNTER — Other Ambulatory Visit: Payer: Self-pay | Admitting: Family Medicine

## 2019-04-28 DIAGNOSIS — E039 Hypothyroidism, unspecified: Secondary | ICD-10-CM

## 2019-04-28 DIAGNOSIS — I428 Other cardiomyopathies: Secondary | ICD-10-CM

## 2019-05-02 ENCOUNTER — Ambulatory Visit (INDEPENDENT_AMBULATORY_CARE_PROVIDER_SITE_OTHER): Payer: BC Managed Care – PPO | Admitting: *Deleted

## 2019-05-02 DIAGNOSIS — I442 Atrioventricular block, complete: Secondary | ICD-10-CM

## 2019-05-02 LAB — CUP PACEART REMOTE DEVICE CHECK
Battery Remaining Longevity: 108 mo
Battery Remaining Percentage: 95.5 %
Battery Voltage: 2.96 V
Brady Statistic AP VP Percent: 9.1 %
Brady Statistic AP VS Percent: 1 %
Brady Statistic AS VP Percent: 91 %
Brady Statistic AS VS Percent: 1 %
Brady Statistic RA Percent Paced: 8.9 %
Brady Statistic RV Percent Paced: 99 %
Date Time Interrogation Session: 20210118020015
Implantable Lead Implant Date: 20160824
Implantable Lead Implant Date: 20160824
Implantable Lead Location: 753859
Implantable Lead Location: 753860
Implantable Pulse Generator Implant Date: 20160824
Lead Channel Impedance Value: 390 Ohm
Lead Channel Impedance Value: 490 Ohm
Lead Channel Pacing Threshold Amplitude: 0.375 V
Lead Channel Pacing Threshold Amplitude: 0.5 V
Lead Channel Pacing Threshold Pulse Width: 0.5 ms
Lead Channel Pacing Threshold Pulse Width: 0.5 ms
Lead Channel Sensing Intrinsic Amplitude: 1.3 mV
Lead Channel Sensing Intrinsic Amplitude: 9.2 mV
Lead Channel Setting Pacing Amplitude: 1.5 V
Lead Channel Setting Pacing Amplitude: 2 V
Lead Channel Setting Pacing Pulse Width: 0.5 ms
Lead Channel Setting Sensing Sensitivity: 2.5 mV
Pulse Gen Model: 3222
Pulse Gen Serial Number: 7802897

## 2019-05-03 NOTE — Progress Notes (Signed)
PPM remote 

## 2019-07-18 ENCOUNTER — Telehealth: Payer: Self-pay | Admitting: Family Medicine

## 2019-07-18 NOTE — Telephone Encounter (Signed)
Patient notified

## 2019-07-18 NOTE — Telephone Encounter (Signed)
Copied from [place] [phone] [id number]. Topic: General - Inquiry >> Jul 18, 2019  1:37 PM Alanda Slim E wrote: Reason for CRM: Pt wants Rachels opinion about her getting the covid vaccine / please advise

## 2019-07-18 NOTE — Telephone Encounter (Signed)
It is recommended

## 2019-07-18 NOTE — Telephone Encounter (Signed)
Routing to provider to advise.  

## 2019-08-01 ENCOUNTER — Ambulatory Visit (INDEPENDENT_AMBULATORY_CARE_PROVIDER_SITE_OTHER): Payer: BC Managed Care – PPO | Admitting: *Deleted

## 2019-08-01 ENCOUNTER — Other Ambulatory Visit: Payer: Self-pay | Admitting: Family Medicine

## 2019-08-01 DIAGNOSIS — I428 Other cardiomyopathies: Secondary | ICD-10-CM

## 2019-08-01 DIAGNOSIS — I442 Atrioventricular block, complete: Secondary | ICD-10-CM | POA: Diagnosis not present

## 2019-08-01 DIAGNOSIS — E039 Hypothyroidism, unspecified: Secondary | ICD-10-CM

## 2019-08-01 LAB — CUP PACEART REMOTE DEVICE CHECK
Battery Remaining Longevity: 108 mo
Battery Remaining Percentage: 95.5 %
Battery Voltage: 2.96 V
Brady Statistic AP VP Percent: 8.4 %
Brady Statistic AP VS Percent: 1 %
Brady Statistic AS VP Percent: 91 %
Brady Statistic AS VS Percent: 1 %
Brady Statistic RA Percent Paced: 8.2 %
Brady Statistic RV Percent Paced: 99 %
Date Time Interrogation Session: 20210419020014
Implantable Lead Implant Date: 20160824
Implantable Lead Implant Date: 20160824
Implantable Lead Location: 753859
Implantable Lead Location: 753860
Implantable Pulse Generator Implant Date: 20160824
Lead Channel Impedance Value: 390 Ohm
Lead Channel Impedance Value: 490 Ohm
Lead Channel Pacing Threshold Amplitude: 0.5 V
Lead Channel Pacing Threshold Amplitude: 0.5 V
Lead Channel Pacing Threshold Pulse Width: 0.5 ms
Lead Channel Pacing Threshold Pulse Width: 0.5 ms
Lead Channel Sensing Intrinsic Amplitude: 2 mV
Lead Channel Sensing Intrinsic Amplitude: 3.3 mV
Lead Channel Setting Pacing Amplitude: 1.5 V
Lead Channel Setting Pacing Amplitude: 2 V
Lead Channel Setting Pacing Pulse Width: 0.5 ms
Lead Channel Setting Sensing Sensitivity: 2.5 mV
Pulse Gen Model: 3222
Pulse Gen Serial Number: 7802897

## 2019-08-01 NOTE — Telephone Encounter (Signed)
Requested Prescriptions  Pending Prescriptions Disp Refills  . digoxin (LANOXIN) 0.125 MG tablet [Pharmacy Med Name: Digoxin 125 MCG Oral Tablet] 90 tablet 0    Sig: Take 1 tablet by mouth once daily     Cardiovascular:  Antiarrhythmic Agents - digoxin Failed - 08/01/2019  9:05 AM      Failed - Digoxin (serum) in normal range and within 180 days    Digoxin, Serum  Date Value Ref Range Status  03/05/2015 0.5 (L) 0.9 - 2.0 ng/mL Final    Comment:    Concentrations above 2.0 ng/mL are generally considered toxic. Some overlap of toxic and non-toxic values have been reported.                             Detection Limit = 0.4 ng/mL          Passed - Cr in normal range and within 180 days    Creatinine, Ser  Date Value Ref Range Status  03/16/2019 0.79 0.57 - 1.00 mg/dL Final         Passed - Ca in normal range and within 180 days    Calcium  Date Value Ref Range Status  03/16/2019 9.1 8.7 - 10.3 mg/dL Final         Passed - K in normal range and within 180 days    Potassium  Date Value Ref Range Status  03/16/2019 4.5 3.5 - 5.2 mmol/L Final         Passed - Last Heart Rate in normal range    Pulse Readings from Last 1 Encounters:  03/16/19 79         Passed - Valid encounter within last 6 months    Recent Outpatient Visits          4 months ago Essential hypertension   [facility] Volney American, [place]   10 months ago Essential hypertension   [facility] Volney American, [place]   1 year ago Essential hypertension   [facility] Volney American, [place]   1 year ago Medication monitoring encounter   [facility] [name], NP   2 years ago Essential hypertension   [place], [place], NP      Future Appointments            In 1 month Orene Desanctis, Lilia Argue, PA-C [place], [facility]           . levothyroxine (SYNTHROID) 112 MCG tablet [Pharmacy Med Name:  Levothyroxine Sodium 112 MCG Oral Tablet] 90 tablet 0    Sig: Take 1 tablet by mouth once daily     Endocrinology:  Hypothyroid Agents Failed - 08/01/2019  9:05 AM      Failed - TSH needs to be rechecked within 3 months after an abnormal result. Refill until TSH is due.      Passed - TSH in normal range and within 360 days    TSH  Date Value Ref Range Status  03/16/2019 1.970 0.450 - 4.500 uIU/mL Final         Passed - Valid encounter within last 12 months    Recent Outpatient Visits          4 months ago Essential hypertension   [place], [place], [place]   10 months ago Essential hypertension   [place], [place], [place]   1 year ago Essential hypertension  [place], Lilia Argue, [place]   1 year ago Medication monitoring encounter   [facility] [name], NP   2 years ago Essential hypertension   [facility] [name], NP      Future Appointments            In 1 month Orene Desanctis, Lilia Argue, [place], [place]

## 2019-08-02 NOTE — Progress Notes (Signed)
PPM Remote  

## 2019-09-07 ENCOUNTER — Other Ambulatory Visit: Payer: Self-pay | Admitting: Family Medicine

## 2019-09-07 ENCOUNTER — Other Ambulatory Visit: Payer: Self-pay | Admitting: Unknown Physician Specialty

## 2019-09-07 NOTE — Telephone Encounter (Signed)
Requested medication (s) are due for refill today: no  Requested medication (s) are on the active medication list: yes  Last refill:  12/27/2018  Future visit scheduled: yes  Notes to clinic:  this refill cannot be delegated    Requested Prescriptions  Pending Prescriptions Disp Refills   clonazePAM (KLONOPIN) 0.5 MG tablet [Pharmacy Med Name: clonazePAM 0.5 MG Oral Tablet] 30 tablet 0    Sig: TAKE 1 TABLET BY MOUTH ONCE DAILY AS NEEDED FOR ANXIETY/ SLEEP      Not Delegated - Psychiatry:  Anxiolytics/Hypnotics Failed - 09/07/2019  7:07 AM      Failed - This refill cannot be delegated      Failed - Urine Drug Screen completed in last 360 days.      Passed - Valid encounter within last 6 months    Recent Outpatient Visits           5 months ago Essential hypertension   [facility] Merrie Roof [place], [place]   12 months ago Essential hypertension   [facility] Merrie Roof [place], [place]   1 year ago Essential hypertension   [facility] Merrie Roof [place], [place]   1 year ago Medication monitoring encounter   [place], NP   2 years ago Essential hypertension   [facility] [name], NP       Future Appointments             In 1 week Orene Desanctis, Lilia Argue, [place], [place]

## 2019-09-07 NOTE — Telephone Encounter (Signed)
Patient last seen 03/15/20 and has appointment 09/15/19.

## 2019-09-15 ENCOUNTER — Other Ambulatory Visit: Payer: Self-pay

## 2019-09-15 ENCOUNTER — Ambulatory Visit (INDEPENDENT_AMBULATORY_CARE_PROVIDER_SITE_OTHER): Payer: BC Managed Care – PPO | Admitting: Family Medicine

## 2019-09-15 ENCOUNTER — Encounter: Payer: Self-pay | Admitting: Family Medicine

## 2019-09-15 VITALS — BP 122/83 | HR 77 | Temp 97.6°F | Wt 189.0 lb

## 2019-09-15 DIAGNOSIS — Z8543 Personal history of malignant neoplasm of ovary: Secondary | ICD-10-CM

## 2019-09-15 DIAGNOSIS — I5022 Chronic systolic (congestive) heart failure: Secondary | ICD-10-CM

## 2019-09-15 DIAGNOSIS — E039 Hypothyroidism, unspecified: Secondary | ICD-10-CM

## 2019-09-15 DIAGNOSIS — I428 Other cardiomyopathies: Secondary | ICD-10-CM

## 2019-09-15 DIAGNOSIS — E7800 Pure hypercholesterolemia, unspecified: Secondary | ICD-10-CM | POA: Diagnosis not present

## 2019-09-15 MED ORDER — LOVASTATIN 20 MG PO TABS: ORAL_TABLET | ORAL | 1 refills | Status: DC

## 2019-09-15 MED ORDER — DIGOXIN 125 MCG PO TABS: 125.0000 ug | ORAL_TABLET | Freq: Every day | ORAL | 1 refills | Status: DC

## 2019-09-15 MED ORDER — LEVOTHYROXINE SODIUM 112 MCG PO TABS: 112.0000 ug | ORAL_TABLET | Freq: Every day | ORAL | 1 refills | Status: DC

## 2019-09-15 MED ORDER — BENAZEPRIL HCL 5 MG PO TABS: 5.0000 mg | ORAL_TABLET | Freq: Every day | ORAL | 1 refills | Status: DC

## 2019-09-15 NOTE — Assessment & Plan Note (Signed)
Recheck CA 125, continue to monitor

## 2019-09-15 NOTE — Assessment & Plan Note (Signed)
Recheck TSH, adjust as needed. Continue current regimen 

## 2019-09-15 NOTE — Progress Notes (Signed)
BP 122/83   Pulse 77   Temp 97.6 F (36.4 C) (Oral)   Wt 189 lb (85.7 kg)   LMP  (LMP Unknown)   SpO2 100%   BMI 31.94 kg/m    Subjective:    Patient ID: [name] [name], female    DOB: 09-Apr-1956, 64 y.o.   MRN: [id number]  HPI: [name] [name] is a 64 y.o. female  Chief Complaint  Patient presents with  . Hypertension  . Hyperlipidemia  . Hypothyroidism   Presenting today for 6 month f/u chronic conditions.   HTN, CHF, NICM - Also followed by Cardiology. Home BPs stable in the 120/80s range. Denies edema, SOB, CP, palpitations. Tolerating medications well.   HLD - on statin, no myalgias, claudication. Stays active and tries to eat healthy diet.   Hypothyroidism - stable on synthroid, no new concerns.   Insomnia - takes rare prn klonopin for sleep, no side effects.   Hx of ovarian cancer - due for CA 125, no new sxs and levels have been stable.   Relevant past medical, surgical, family and social history reviewed and updated as indicated. Interim medical history since our last visit reviewed. Allergies and medications reviewed and updated.  Review of Systems  Per HPI unless specifically indicated above     Objective:    BP 122/83   Pulse 77   Temp 97.6 F (36.4 C) (Oral)   Wt 189 lb (85.7 kg)   LMP  (LMP Unknown)   SpO2 100%   BMI 31.94 kg/m   Wt Readings from Last 3 Encounters:  09/15/19 189 lb (85.7 kg)  03/16/19 186 lb 3.2 oz (84.5 kg)  09/08/18 187 lb 11.2 oz (85.1 kg)    Physical Exam Vitals and nursing note reviewed.  Constitutional:      Appearance: Normal appearance. She is not ill-appearing.  HENT:     Head: Atraumatic.  Eyes:     Extraocular Movements: Extraocular movements intact.     Conjunctiva/sclera: Conjunctivae normal.  Cardiovascular:     Rate and Rhythm: Normal rate and regular rhythm.     Heart sounds: Normal heart sounds.  Pulmonary:     Effort: Pulmonary effort is normal.     Breath sounds: Normal breath sounds.    Musculoskeletal:        General: Normal range of motion.     Cervical back: Normal range of motion and neck supple.  Skin:    General: Skin is warm and dry.  Neurological:     Mental Status: She is alert and oriented to person, place, and time.  Psychiatric:        Mood and Affect: Mood normal.        Thought Content: Thought content normal.        Judgment: Judgment normal.     Results for orders placed or performed in visit on 08/01/19  CUP PACEART REMOTE DEVICE CHECK  Result Value Ref Range   Date Time Interrogation Session [phone]    Pulse Generator Manufacturer [facility]    Pulse Gen Model 3222 Allure RF    Pulse Gen Serial Number [id number]    Clinic Name [facility] Pulse Generator Type Cardiac Resynch Therapy Pacemaker    Implantable Pulse Generator Implant Date DP:[id number]    Implantable Lead Manufacturer [facility]    Implantable Lead Model 1688TC Tendril SDX    Implantable Lead Serial Number [id number]    Implantable Lead Implant Date DP:[id number]  Implantable Lead Location [id number]    Implantable Lead Manufacturer [facility]    Implantable Lead Model 1688TC Tendril SDX    Implantable Lead Serial Number [id number]    Implantable Lead Implant Date DP:[id number]    Implantable Lead Location Detail 1 UNKNOWN    Implantable Lead Location [phone]    Lead Channel Setting Sensing Sensitivity 2.5 mV   Lead Channel Setting Sensing Adaptation Mode Fixed Pacing    Lead Channel Setting Pacing Amplitude 1.5 V   Lead Channel Setting Pacing Pulse Width 0.5 ms   Lead Channel Setting Pacing Amplitude 2.0 V   Lead Channel Status NULL    Lead Channel Impedance Value 390 ohm   Lead Channel Sensing Intrinsic Amplitude 2.0 mV   Lead Channel Pacing Threshold Amplitude 0.5 V   Lead Channel Pacing Threshold Pulse Width 0.5 ms   Lead Channel Status NULL    Lead Channel Impedance Value 490 ohm   Lead Channel Sensing Intrinsic Amplitude 3.3 mV   Lead Channel Pacing Threshold Amplitude 0.5 V    Lead Channel Pacing Threshold Pulse Width 0.5 ms   Battery Status MOS    Battery Remaining Longevity 108 mo   Battery Remaining Percentage 95.5 %   Battery Voltage 2.96 V   Brady Statistic RA Percent Paced 8.2 %   Brady Statistic RV Percent Paced 99.0 %   Brady Statistic AP VP Percent 8.4 %   Brady Statistic AS VP Percent 91.0 %   Brady Statistic AP VS Percent 1.0 %   Brady Statistic AS VS Percent 1.0 %      Assessment & Plan:   Problem List Items Addressed This Visit      Cardiovascular and Mediastinum   NICM (nonischemic cardiomyopathy) (HCC)    Stable and well controlled, continue current regimen per cardiology      Relevant Medications   benazepril (LOTENSIN) 5 MG tablet   digoxin (LANOXIN) 0.125 MG tablet   lovastatin (MEVACOR) 20 MG tablet   Chronic systolic CHF (congestive heart failure) (HCC)    Stable, appears euvolemic. Continue current regimen with close monitoring per Cardiology      Relevant Medications   benazepril (LOTENSIN) 5 MG tablet   digoxin (LANOXIN) 0.125 MG tablet   lovastatin (MEVACOR) 20 MG tablet   Other Relevant Orders   Comprehensive metabolic panel     Endocrine   Hypothyroidism - Primary    Recheck TSH, adjust as needed. Continue current regimen      Relevant Medications   levothyroxine (SYNTHROID) 112 MCG tablet   Other Relevant Orders   TSH     Other   Hypercholesteremia    Recheck lipids, adjust as needed. Continue current regimen      Relevant Medications   benazepril (LOTENSIN) 5 MG tablet   digoxin (LANOXIN) 0.125 MG tablet   lovastatin (MEVACOR) 20 MG tablet   Other Relevant Orders   Lipid Panel w/o Chol/HDL Ratio   History of ovarian cancer    Recheck CA 125, continue to monitor      Relevant Orders   CA 125       Follow up plan: Return in about 6 months (around 03/16/2020) for 6 month f/u, CPE if able ([employer]).

## 2019-09-15 NOTE — Assessment & Plan Note (Signed)
Recheck lipids, adjust as needed. Continue current regimen 

## 2019-09-15 NOTE — Assessment & Plan Note (Signed)
Stable and well controlled, continue current regimen per cardiology

## 2019-09-15 NOTE — Assessment & Plan Note (Signed)
Stable, appears euvolemic. Continue current regimen with close monitoring per Cardiology

## 2019-09-16 LAB — LIPID PANEL W/O CHOL/HDL RATIO
Cholesterol, Total: 161 mg/dL (ref 100–199)
HDL: 51 mg/dL (ref >39)
LDL Chol Calc (NIH): 93 mg/dL (ref 0–99)
Triglycerides: 91 mg/dL (ref 0–149)
VLDL Cholesterol Cal: 17 mg/dL (ref 5–40)

## 2019-09-16 LAB — TSH: TSH: 1.75 u[IU]/mL (ref 0.450–4.500)

## 2019-09-16 LAB — COMPREHENSIVE METABOLIC PANEL
ALT: 16 IU/L (ref 0–32)
AST: 22 IU/L (ref 0–40)
Albumin/Globulin Ratio: 1.6 (ref 1.2–2.2)
Albumin: 4.4 g/dL (ref 3.8–4.8)
Alkaline Phosphatase: 101 IU/L (ref 48–121)
BUN/Creatinine Ratio: 12 (ref 12–28)
BUN: 9 mg/dL (ref 8–27)
Bilirubin Total: 0.3 mg/dL (ref 0.0–1.2)
CO2: 23 mmol/L (ref 20–29)
Calcium: 9.3 mg/dL (ref 8.7–10.3)
Chloride: 100 mmol/L (ref 96–106)
Creatinine, Ser: 0.74 mg/dL (ref 0.57–1.00)
GFR calc Af Amer: 101 mL/min/{1.73_m2} (ref >59)
GFR calc non Af Amer: 88 mL/min/{1.73_m2} (ref >59)
Globulin, Total: 2.8 g/dL (ref 1.5–4.5)
Glucose: 111 mg/dL — ABNORMAL HIGH (ref 65–99)
Potassium: 4.6 mmol/L (ref 3.5–5.2)
Sodium: 135 mmol/L (ref 134–144)
Total Protein: 7.2 g/dL (ref 6.0–8.5)

## 2019-09-16 LAB — CA 125: Cancer Antigen (CA) 125: 11.4 U/mL (ref 0.0–38.1)

## 2019-09-29 ENCOUNTER — Telehealth: Payer: Self-pay

## 2019-09-29 NOTE — Telephone Encounter (Signed)
The pt states she recently moved and her new home do not have cell signal. She do not have a land line. She states she can bring it to work on her days to transmit and do it there. I told her that would be fine because her only other option would be for her to come into the office every 6 months in [place]. She states she will take it to work with her.

## 2019-10-31 ENCOUNTER — Ambulatory Visit (INDEPENDENT_AMBULATORY_CARE_PROVIDER_SITE_OTHER): Payer: BC Managed Care – PPO | Admitting: *Deleted

## 2019-10-31 DIAGNOSIS — I442 Atrioventricular block, complete: Secondary | ICD-10-CM

## 2019-10-31 DIAGNOSIS — I428 Other cardiomyopathies: Secondary | ICD-10-CM | POA: Diagnosis not present

## 2019-11-01 LAB — CUP PACEART REMOTE DEVICE CHECK
Battery Remaining Longevity: 108 mo
Battery Remaining Percentage: 95.5 %
Battery Voltage: 2.96 V
Brady Statistic AP VP Percent: 8.2 %
Brady Statistic AP VS Percent: 1 %
Brady Statistic AS VP Percent: 92 %
Brady Statistic AS VS Percent: 1 %
Brady Statistic RA Percent Paced: 8.1 %
Brady Statistic RV Percent Paced: 99 %
Date Time Interrogation Session: 20210719081502
Implantable Lead Implant Date: 20160824
Implantable Lead Implant Date: 20160824
Implantable Lead Location: 753859
Implantable Lead Location: 753860
Implantable Pulse Generator Implant Date: 20160824
Lead Channel Impedance Value: 390 Ohm
Lead Channel Impedance Value: 490 Ohm
Lead Channel Pacing Threshold Amplitude: 0.5 V
Lead Channel Pacing Threshold Amplitude: 0.5 V
Lead Channel Pacing Threshold Pulse Width: 0.5 ms
Lead Channel Pacing Threshold Pulse Width: 0.5 ms
Lead Channel Sensing Intrinsic Amplitude: 1.3 mV
Lead Channel Sensing Intrinsic Amplitude: 3.3 mV
Lead Channel Setting Pacing Amplitude: 1.5 V
Lead Channel Setting Pacing Amplitude: 2 V
Lead Channel Setting Pacing Pulse Width: 0.5 ms
Lead Channel Setting Sensing Sensitivity: 2.5 mV
Pulse Gen Model: 3222
Pulse Gen Serial Number: 7802897

## 2019-11-02 NOTE — Progress Notes (Signed)
Remote pacemaker transmission.   

## 2020-01-05 ENCOUNTER — Other Ambulatory Visit: Payer: Self-pay | Admitting: Internal Medicine

## 2020-01-05 DIAGNOSIS — Z1231 Encounter for screening mammogram for malignant neoplasm of breast: Secondary | ICD-10-CM

## 2020-01-05 DIAGNOSIS — C569 Malignant neoplasm of unspecified ovary: Secondary | ICD-10-CM | POA: Diagnosis not present

## 2020-01-05 DIAGNOSIS — I739 Peripheral vascular disease, unspecified: Secondary | ICD-10-CM | POA: Diagnosis not present

## 2020-01-05 DIAGNOSIS — I10 Essential (primary) hypertension: Secondary | ICD-10-CM | POA: Diagnosis not present

## 2020-01-05 DIAGNOSIS — Z7689 Persons encountering health services in other specified circumstances: Secondary | ICD-10-CM | POA: Diagnosis not present

## 2020-01-30 ENCOUNTER — Ambulatory Visit (INDEPENDENT_AMBULATORY_CARE_PROVIDER_SITE_OTHER): Payer: BC Managed Care – PPO

## 2020-01-30 DIAGNOSIS — I442 Atrioventricular block, complete: Secondary | ICD-10-CM | POA: Diagnosis not present

## 2020-01-31 DIAGNOSIS — D2271 Melanocytic nevi of right lower limb, including hip: Secondary | ICD-10-CM | POA: Diagnosis not present

## 2020-01-31 DIAGNOSIS — D225 Melanocytic nevi of trunk: Secondary | ICD-10-CM | POA: Diagnosis not present

## 2020-01-31 DIAGNOSIS — D2261 Melanocytic nevi of right upper limb, including shoulder: Secondary | ICD-10-CM | POA: Diagnosis not present

## 2020-01-31 DIAGNOSIS — D2262 Melanocytic nevi of left upper limb, including shoulder: Secondary | ICD-10-CM | POA: Diagnosis not present

## 2020-01-31 LAB — CUP PACEART REMOTE DEVICE CHECK
Battery Remaining Longevity: 110 mo
Battery Remaining Percentage: 95.5 %
Battery Voltage: 2.96 V
Brady Statistic AP VP Percent: 8.1 %
Brady Statistic AP VS Percent: 1 %
Brady Statistic AS VP Percent: 92 %
Brady Statistic AS VS Percent: 1 %
Brady Statistic RA Percent Paced: 7.9 %
Brady Statistic RV Percent Paced: 99 %
Date Time Interrogation Session: 20211018090812
Implantable Lead Implant Date: 20160824
Implantable Lead Implant Date: 20160824
Implantable Lead Location: 753859
Implantable Lead Location: 753860
Implantable Pulse Generator Implant Date: 20160824
Lead Channel Impedance Value: 380 Ohm
Lead Channel Impedance Value: 480 Ohm
Lead Channel Pacing Threshold Amplitude: 0.5 V
Lead Channel Pacing Threshold Amplitude: 0.5 V
Lead Channel Pacing Threshold Pulse Width: 0.5 ms
Lead Channel Pacing Threshold Pulse Width: 0.5 ms
Lead Channel Sensing Intrinsic Amplitude: 1.3 mV
Lead Channel Sensing Intrinsic Amplitude: 3.3 mV
Lead Channel Setting Pacing Amplitude: 1.5 V
Lead Channel Setting Pacing Amplitude: 2 V
Lead Channel Setting Pacing Pulse Width: 0.5 ms
Lead Channel Setting Sensing Sensitivity: 2.5 mV
Pulse Gen Model: 3222
Pulse Gen Serial Number: 7802897

## 2020-02-03 NOTE — Progress Notes (Signed)
Remote pacemaker transmission.   

## 2020-03-12 ENCOUNTER — Other Ambulatory Visit: Payer: Self-pay

## 2020-03-12 ENCOUNTER — Ambulatory Visit
Admission: RE | Admit: 2020-03-12 | Discharge: 2020-03-12 | Disposition: A | Discharge status: Home / Self Care | Payer: BC Managed Care – PPO | Source: Ambulatory Visit | Attending: Internal Medicine | Admitting: Internal Medicine

## 2020-03-12 DIAGNOSIS — Z1231 Encounter for screening mammogram for malignant neoplasm of breast: Secondary | ICD-10-CM | POA: Diagnosis not present

## 2020-03-20 ENCOUNTER — Encounter: Payer: BC Managed Care – PPO | Admitting: Nurse Practitioner

## 2020-03-20 ENCOUNTER — Encounter: Payer: BC Managed Care – PPO | Admitting: Family Medicine

## 2020-03-29 ENCOUNTER — Other Ambulatory Visit: Payer: Self-pay | Admitting: Family Medicine

## 2020-04-20 DIAGNOSIS — R053 Chronic cough: Secondary | ICD-10-CM | POA: Diagnosis not present

## 2020-04-20 DIAGNOSIS — R059 Cough, unspecified: Secondary | ICD-10-CM | POA: Diagnosis not present

## 2020-04-20 DIAGNOSIS — Z8543 Personal history of malignant neoplasm of ovary: Secondary | ICD-10-CM | POA: Diagnosis not present

## 2020-04-20 DIAGNOSIS — Z780 Asymptomatic menopausal state: Secondary | ICD-10-CM | POA: Diagnosis not present

## 2020-04-20 DIAGNOSIS — E559 Vitamin D deficiency, unspecified: Secondary | ICD-10-CM | POA: Diagnosis not present

## 2020-04-20 DIAGNOSIS — I739 Peripheral vascular disease, unspecified: Secondary | ICD-10-CM | POA: Diagnosis not present

## 2020-04-20 DIAGNOSIS — Z0000 Encounter for general adult medical examination without abnormal findings: Secondary | ICD-10-CM | POA: Diagnosis not present

## 2020-04-24 ENCOUNTER — Other Ambulatory Visit: Payer: Self-pay | Admitting: Family Medicine

## 2020-04-24 DIAGNOSIS — I428 Other cardiomyopathies: Secondary | ICD-10-CM

## 2020-04-24 DIAGNOSIS — E039 Hypothyroidism, unspecified: Secondary | ICD-10-CM

## 2020-04-26 ENCOUNTER — Other Ambulatory Visit: Payer: Self-pay | Admitting: Family Medicine

## 2020-04-26 DIAGNOSIS — I428 Other cardiomyopathies: Secondary | ICD-10-CM

## 2020-04-27 ENCOUNTER — Other Ambulatory Visit: Payer: Self-pay | Admitting: Family Medicine

## 2020-04-27 DIAGNOSIS — E039 Hypothyroidism, unspecified: Secondary | ICD-10-CM

## 2020-04-27 NOTE — Telephone Encounter (Signed)
   Notes to clinic Not a provider we approve rx for, no PCP listed.

## 2020-05-01 ENCOUNTER — Ambulatory Visit (INDEPENDENT_AMBULATORY_CARE_PROVIDER_SITE_OTHER): Payer: BC Managed Care – PPO

## 2020-05-01 DIAGNOSIS — I442 Atrioventricular block, complete: Secondary | ICD-10-CM | POA: Diagnosis not present

## 2020-05-01 LAB — CUP PACEART REMOTE DEVICE CHECK
Battery Remaining Longevity: 106 mo
Battery Remaining Percentage: 95.5 %
Battery Voltage: 2.96 V
Brady Statistic AP VP Percent: 7.8 %
Brady Statistic AP VS Percent: 1 %
Brady Statistic AS VP Percent: 92 %
Brady Statistic AS VS Percent: 1 %
Brady Statistic RA Percent Paced: 7.6 %
Brady Statistic RV Percent Paced: 99 %
Date Time Interrogation Session: 20220118103134
Implantable Lead Implant Date: 20160824
Implantable Lead Implant Date: 20160824
Implantable Lead Location: 753859
Implantable Lead Location: 753860
Implantable Pulse Generator Implant Date: 20160824
Lead Channel Impedance Value: 380 Ohm
Lead Channel Impedance Value: 460 Ohm
Lead Channel Pacing Threshold Amplitude: 0.5 V
Lead Channel Pacing Threshold Amplitude: 0.625 V
Lead Channel Pacing Threshold Pulse Width: 0.5 ms
Lead Channel Pacing Threshold Pulse Width: 0.5 ms
Lead Channel Sensing Intrinsic Amplitude: 1.3 mV
Lead Channel Sensing Intrinsic Amplitude: 3.3 mV
Lead Channel Setting Pacing Amplitude: 1.5 V
Lead Channel Setting Pacing Amplitude: 2 V
Lead Channel Setting Pacing Pulse Width: 0.5 ms
Lead Channel Setting Sensing Sensitivity: 2.5 mV
Pulse Gen Model: 3222
Pulse Gen Serial Number: 7802897

## 2020-05-15 NOTE — Progress Notes (Signed)
Remote pacemaker transmission.   

## 2020-05-31 ENCOUNTER — Telehealth: Payer: Self-pay | Admitting: Internal Medicine

## 2020-05-31 NOTE — Telephone Encounter (Signed)
Attempted to schedule from recall.  Last ov 2018 .  Patient does not feel this is needed as she has been doing regular remote visits.  Patient states she will do and ekg and check device but does not want the expense of further testing ie echo.   Patient wants someone to discuss why a visit would be needed .

## 2020-05-31 NOTE — Telephone Encounter (Signed)
I spoke with the patient.  I advised her that we do require in office physician/ device checks once a year to perform and EKG/ check the device in office to insure that all settings are appropriate at this time.  I have advised her that any extra testing can be discussed with Dr. Caryl Comes at the time of her visit, but she does have the right of refusal for anything else.  The patient voices understanding and is agreeable.   She has been scheduled with Dr. Caryl Comes for an appointment on 07/10/20 at 8:40 am.

## 2020-06-04 ENCOUNTER — Other Ambulatory Visit: Payer: Self-pay | Admitting: Family Medicine

## 2020-07-10 ENCOUNTER — Ambulatory Visit (INDEPENDENT_AMBULATORY_CARE_PROVIDER_SITE_OTHER): Payer: BC Managed Care – PPO

## 2020-07-10 ENCOUNTER — Ambulatory Visit (INDEPENDENT_AMBULATORY_CARE_PROVIDER_SITE_OTHER): Payer: BC Managed Care – PPO | Admitting: Internal Medicine

## 2020-07-10 ENCOUNTER — Other Ambulatory Visit: Payer: Self-pay

## 2020-07-10 ENCOUNTER — Encounter: Payer: Self-pay | Admitting: Internal Medicine

## 2020-07-10 VITALS — BP 120/70 | HR 73 | Ht 65.0 in | Wt 190.1 lb

## 2020-07-10 DIAGNOSIS — I428 Other cardiomyopathies: Secondary | ICD-10-CM

## 2020-07-10 DIAGNOSIS — I442 Atrioventricular block, complete: Secondary | ICD-10-CM | POA: Diagnosis not present

## 2020-07-10 DIAGNOSIS — Z950 Presence of cardiac pacemaker: Secondary | ICD-10-CM

## 2020-07-10 DIAGNOSIS — Z79899 Other long term (current) drug therapy: Secondary | ICD-10-CM

## 2020-07-10 DIAGNOSIS — I5022 Chronic systolic (congestive) heart failure: Secondary | ICD-10-CM

## 2020-07-10 MED ORDER — CARVEDILOL 3.125 MG PO TABS: 3.1250 mg | ORAL_TABLET | Freq: Two times a day (BID) | ORAL | 1 refills | Status: DC

## 2020-07-10 NOTE — Patient Instructions (Signed)
Medication Instructions:  - Your physician has recommended you make the following change in your medication:   1) START Coreg (carvedilol) 3.125 mg- take 1 tablet by mouth TWICE daily   *If you need a refill on your cardiac medications before your next appointment, please call your pharmacy*   Lab Work: - Your physician recommends that you have lab work today: BMP/ Digoxin  If you have labs (blood work) drawn today and your tests are completely normal, you will receive your results only by: Marland Kitchen MyChart Message (if you have MyChart) OR . A paper copy in the mail If you have any lab test that is abnormal or we need to change your treatment, we will call you to review the results.   Testing/Procedures:  1) Echocardiogram: - Your physician has requested that you have an echocardiogram. Echocardiography is a painless test that uses sound waves to create images of your heart. It provides your doctor with information about the size and shape of your heart and how well your heart's chambers and valves are working. This procedure takes approximately one hour. There are no restrictions for this procedure. There is a possibility that an IV may need to be started during your test to inject an image enhancing agent. This is done to obtain more optimal pictures of your heart. Therefore we ask that you do at least drink some water prior to coming in to hydrate your veins.    Follow-Up: At [facility], you and your health needs are our priority.  As part of our continuing mission to provide you with exceptional heart care, we have created designated Provider Care Teams.  These Care Teams include your primary Cardiologist (physician) and Advanced Practice Providers (APPs -  Physician Assistants and Nurse Practitioners) who all work together to provide you with the care you need, when you need it.  We recommend signing up for the patient portal called "MyChart".  Sign up information is provided on this  After Visit Summary.  MyChart is used to connect with patients for Virtual Visits (Telemedicine).  Patients are able to view lab/test results, encounter notes, upcoming appointments, etc.  Non-urgent messages can be sent to your provider as well.   To learn more about what you can do with MyChart, go to [email or web address].    Your next appointment:   1) 1 month- with a PA/ NP   2) 3 months with Dr. Caryl Comes  The format for your next appointment:   In Person  Provider:   As above   Other Instructions  Carvedilol Tablets What is this medicine? CARVEDILOL (KAR ve dil ol) is a beta blocker. It decreases the amount of work your heart has to do and helps your heart beat regularly. It treats high blood pressure. This medicine may be used for other purposes; ask your health care provider or pharmacist if you have questions. COMMON BRAND NAME(S): Coreg What should I tell my health care provider before I take this medicine? They need to know if you have any of these conditions:  circulation problems  diabetes  history of heart attack or heart disease  liver disease  lung or breathing disease, like asthma or emphysema  pheochromocytoma  slow or irregular heartbeat  thyroid disease  an unusual or allergic reaction to carvedilol, other beta-blockers, medicines, foods, dyes, or preservatives  pregnant or trying to get pregnant  breast-feeding How should I use this medicine? Take this drug by mouth. Take it as directed on the prescription label  at the same time every day. Take it with food. Keep taking it unless your health care provider tells you to stop. Talk to your health care provider about the use of this drug in children. Special care may be needed. Overdosage: If you think you have taken too much of this medicine contact a poison control center or emergency room at once. NOTE: This medicine is only for you. Do not share this medicine with others. What if I miss a  dose? If you miss a dose, take it as soon as you can. If it is almost time for your next dose, take only that dose. Do not take double or extra doses. What may interact with this medicine? This medicine may interact with the following medications:  certain medicines for blood pressure, heart disease, irregular heart beat  certain medicines for depression, like fluoxetine or paroxetine  certain medicines for diabetes, like glipizide or glyburide  cimetidine  clonidine  cyclosporine  digoxin  MAOIs like Carbex, Eldepryl, Marplan, Nardil, and Parnate  reserpine  rifampin This list may not describe all possible interactions. Give your health care provider a list of all the medicines, herbs, non-prescription drugs, or dietary supplements you use. Also tell them if you smoke, drink alcohol, or use illegal drugs. Some items may interact with your medicine. What should I watch for while using this medicine? Check your heart rate and blood pressure regularly while you are taking this medicine. Ask your doctor or health care professional what your heart rate and blood pressure should be, and when you should contact him or her. Do not stop taking this medicine suddenly. This could lead to serious heart-related effects. Contact your doctor or health care professional if you have difficulty breathing while taking this drug. Check your weight daily. Ask your doctor or health care professional when you should notify him/her of any weight gain. You may get drowsy or dizzy. Do not drive, use machinery, or do anything that requires mental alertness until you know how this medicine affects you. To reduce the risk of dizzy or fainting spells, do not sit or stand up quickly. Alcohol can make you more drowsy, and increase flushing and rapid heartbeats. Avoid alcoholic drinks. This medicine may increase blood sugar. Ask your healthcare provider if changes in diet or medicines are needed if you have  diabetes. If you are going to have surgery, tell your doctor or health care professional that you are taking this medicine. What side effects may I notice from receiving this medicine? Side effects that you should report to your doctor or health care professional as soon as possible:  allergic reactions like skin rash, itching or hives, swelling of the face, lips, or tongue  breathing problems  dark urine  irregular heartbeat   signs and symptoms of high blood sugar such as being more thirsty or hungry or having to urinate more than normal. You may also feel very tired or have blurry vision.  swollen legs or ankles  vomiting  yellowing of the eyes or skin Side effects that usually do not require medical attention (report to your doctor or health care professional if they continue or are bothersome):  change in sex drive or performance  diarrhea  dry eyes (especially if wearing contact lenses)  dry, itching skin  headache  nausea  unusually tired This list may not describe all possible side effects. Call your doctor for medical advice about side effects. You may report side effects to FDA at 1-800-FDA-1088.  Where should I keep my medicine? Keep out of the reach of children and pets. Store at room temperature between 20 and 25 degrees C (68 and 77 degrees F). Protect from moisture. Keep the container tightly closed. Throw away any unused drug after the expiration date. NOTE: This sheet is a summary. It may not cover all possible information. If you have questions about this medicine, talk to your doctor, pharmacist, or health care provider.  2021 Elsevier/Gold Standard (2018-11-05 17:42:09)    Echocardiogram An echocardiogram is a test that uses sound waves (ultrasound) to produce images of the heart. Images from an echocardiogram can provide important information about:  Heart size and shape.  The size and thickness and movement of your heart's walls.  Heart  muscle function and strength.  Heart valve function or if you have stenosis. Stenosis is when the heart valves are too narrow.  If blood is flowing backward through the heart valves (regurgitation).  A tumor or infectious growth around the heart valves.  Areas of heart muscle that are not working well because of poor blood flow or injury from a heart attack.  Aneurysm detection. An aneurysm is a weak or damaged part of an artery wall. The wall bulges out from the normal force of blood pumping through the body. Tell a health care provider about:  Any allergies you have.  All medicines you are taking, including vitamins, herbs, eye drops, creams, and over-the-counter medicines.  Any blood disorders you have.  Any surgeries you have had.  Any medical conditions you have.  Whether you are pregnant or may be pregnant. What are the risks? Generally, this is a safe test. However, problems may occur, including an allergic reaction to dye (contrast) that may be used during the test. What happens before the test? No specific preparation is needed. You may eat and drink normally. What happens during the test?  You will take off your clothes from the waist up and put on a hospital gown.  Electrodes or electrocardiogram (ECG)patches may be placed on your chest. The electrodes or patches are then connected to a device that monitors your heart rate and rhythm.  You will lie down on a table for an ultrasound exam. A gel will be applied to your chest to help sound waves pass through your skin.  A handheld device, called a transducer, will be pressed against your chest and moved over your heart. The transducer produces sound waves that travel to your heart and bounce back (or "echo" back) to the transducer. These sound waves will be captured in real-time and changed into images of your heart that can be viewed on a video monitor. The images will be recorded on a computer and reviewed by your health  care provider.  You may be asked to change positions or hold your breath for a short time. This makes it easier to get different views or better views of your heart.  In some cases, you may receive contrast through an IV in one of your veins. This can improve the quality of the pictures from your heart. The procedure may vary among health care providers and hospitals.   What can I expect after the test? You may return to your normal, everyday life, including diet, activities, and medicines, unless your health care provider tells you not to do that. Follow these instructions at home:  It is up to you to get the results of your test. Ask your health care provider, or the department  that is doing the test, when your results will be ready.  Keep all follow-up visits. This is important. Summary  An echocardiogram is a test that uses sound waves (ultrasound) to produce images of the heart.  Images from an echocardiogram can provide important information about the size and shape of your heart, heart muscle function, heart valve function, and other possible heart problems.  You do not need to do anything to prepare before this test. You may eat and drink normally.  After the echocardiogram is completed, you may return to your normal, everyday life, unless your health care provider tells you not to do that. This information is not intended to replace advice given to you by your health care provider. Make sure you discuss any questions you have with your health care provider. Document Revised: 11/22/2019 Document Reviewed: 11/22/2019 Elsevier Patient Education  2021 [employer].

## 2020-07-10 NOTE — Progress Notes (Signed)
Monia Pouch      Patient Care Team: Gladstone Lighter, MD as PCP - General (Internal Medicine)   HPI  [name] [name] is a 65 y.o. female seen in follow up forcongenital complete heart block for which pacemaker  pacemaker initially implanted in 1990s with generator replacement 2012.  8/16  underwent extraction of the initial system 2/2 noise on both her atrial and ventricular leads and new device implantation contralaterally.  There was concern for radiation injury given prolonged exposure and hence LV lead placement was not  further pursued after long difficulty in cannulating the concern sinus (GT)     DATE TEST EF   2010 /  echo 45   7/*15 echo   40-45   7/16 myoview   40% No ischemia  10/16  Cath  30-35% CA patent   Date Cr K Hgb  6/21 0.74 4.3 15.1         The patient denies chest pain, nocturnal dyspnea, orthopnea or peripheral edema.  There have been no palpitations, lightheadedness or syncope.  Mild dyspnea on exertion; was able to climb stairs     Past Medical History:  Diagnosis Date  . Anxiety    takes Klonopin daily as needed  . Arthritis   . Back pain    reason unknown  . Chronic systolic CHF (congestive heart failure) ([place])    a. echo 10/2013: EF 40-45 (apical images 35-40%), diff HK, mild MR, PASP nl; b. echo 12/2014: EF 35-40%, no RWMA, GR1DD, mod TR, PASP nl, small pericardial effusion circum to the heart, no hemo compromise.  . Complete AV block ([place])    a. s/p pacemaker insertion 1996; b. s/p contralateral side St. Jude generator change 11/2014.  . Dizziness    occasionally  . GERD (gastroesophageal reflux disease)    takes Zantac daily as needed  . Gout   . Headache    occasionally  . History of blood clots    in heart-was on Coumadin---this many yrs ago  . History of colon polyps    benign  . Hyperlipidemia    takes Lovastatin daily  . Hypertension    hx of-since hip replacement MD took resident off meds 10/30/14 b/c well controlled  . Hypothyroid     takes Synthroid daily  . Joint pain   . Joint swelling   . NICM (nonischemic cardiomyopathy) ([place])    a. tachycardia induced->EF 40-45% (2010), 35-40% (10/2013), 35-40% Gr1 DD, mod TR (12/2014);  b. 01/2015 Cath: Nl cors, EF 30-35%, glob HK.  . Numbness and tingling    left leg and right arm  . Ovarian cancer (HCC)    ovarian, skin   . 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 Peripheral artery disease ([place])    stent on lt illiac    Past Surgical History:  Procedure Laterality Date  . ABDOMINAL HYSTERECTOMY    . ANGIOPLASTY / STENTING ILIAC    . CARDIAC CATHETERIZATION N/A 02/08/2015   Procedure: Left Heart Cath and Coronary Angiography;  Surgeon: Wellington Hampshire, MD;  Location: [facility];  Service: Cardiovascular;  Laterality: N/A;  . CERVICAL CERCLAGE  14yrs ago  . COLONOSCOPY    . DG BARIUM SWALLOW ([place] HX)    . DILATION AND CURETTAGE OF UTERUS    . EP IMPLANTABLE DEVICE Left 12/06/2014   Procedure: Implantation of cardiac resynchronization therapy pacemaker;  Surgeon: Evans Lance, MD;  Location: [facility];  Service: Cardiovascular;  Laterality: Left;  . HERNIA MESH REMOVAL Right    incisional  . HERNIA REPAIR    . ILIAC ARTERY STENT  Apr 05, 2010   Left artery stenting  . INSERT / REPLACE / REMOVE PACEMAKER    . JOINT REPLACEMENT  October 31, 2014   hip  . KNEE SURGERY Right   . PACEMAKER INSERTION  2012   Had replaced in July 2012, first placed in 1996  . PACEMAKER LEAD REMOVAL Right 12/06/2014   Procedure: REMOVAL OF RV AND RA PACEMAKER LEADS;  Surgeon: Evans Lance, MD;  Location: [facility];  Service: Cardiovascular;  Laterality: Right;  DR. BARTLE TO BACK UP   . PACEMAKER PLACEMENT Left 12/06/14  . TOTAL ABDOMINAL HYSTERECTOMY W/ BILATERAL SALPINGOOPHORECTOMY  07/2008   Ovarian cancer  . TOTAL HIP ARTHROPLASTY Right 10/31/2014   Procedure: TOTAL HIP ARTHROPLASTY ANTERIOR APPROACH;   Surgeon: Hessie Knows, MD;  Location: [facility];  Service: Orthopedics;  Laterality: Right;    Current Outpatient Medications  Medication Sig Dispense Refill  . Ascorbic Acid (VITAMIN C) 1000 MG tablet Take 1,000 mg by mouth daily.    Marland Kitchen aspirin EC 81 MG tablet Take 81 mg by mouth daily.    . benazepril (LOTENSIN) 5 MG tablet Take 1 tablet (5 mg total) by mouth daily. 90 tablet 1  . clonazePAM (KLONOPIN) 0.5 MG tablet TAKE 1 TABLET BY MOUTH ONCE DAILY AS NEEDED FOR ANXIETY/ SLEEP 30 tablet 0  . digoxin (LANOXIN) 0.125 MG tablet Take 1 tablet (125 mcg total) by mouth daily. 90 tablet 1  . ergocalciferol (VITAMIN D2) 1.25 MG (50000 UT) capsule Take 50,000 Units by mouth once a week.    . levothyroxine (SYNTHROID) 112 MCG tablet Take 1 tablet by mouth once daily 90 tablet 1  . lovastatin (MEVACOR) 20 MG tablet TAKE 1 TABLET BY MOUTH AT BEDTIME 90 tablet 0  . zinc gluconate 50 MG tablet Take 50 mg by mouth daily.     No current facility-administered medications for this visit.    Allergies  Allergen Reactions  . Marshmallow [Althaea Officinalis]     Rash, hives, itching, SOB  . Meat Extract     Rash, hives itching, SOB.  Can tolerate some meats ONLY in moderation  . Other     Jello - Rash, hives, itching, SOB  . Contrast Media [Iodinated Diagnostic Agents]     unknown  . Latex     rash  . Nickel     rash    Review of Systems negative except from HPI and PMH  Physical Exam BP 120/70 (BP Location: Left Arm, Patient Position: Sitting, Cuff Size: Normal)   Pulse 73   Ht 5\' 5"  (1.651 m)   Wt 190 lb 2 oz (86.2 kg)   LMP  (LMP Unknown)   SpO2 98%   BMI 31.64 kg/m  Well developed and well nourished in no acute distress HENT normal Neck supple with JVP-flat Clear Device pocket well healed; without hematoma or erythema.  There is no tethering  Regular rate and rhythm, no  murmur Abd-soft with active BS No Clubbing cyanosis  edema Skin-warm and dry A & Oriented  Grossly normal  sensory and motor function  ECG P synchronous pacing at 77     Assessment and  Plan  Complete heart block-congenital  Pacemaker-St. Jude  Nonsichemic Cardiomyopathy    Congestive heart failure-chronic systolic  Peripheral vascular disease status post iliac stenting  Pacemaker lead revision dual  chamber CRT-P can with failure to cannulate the coronary sinus  Euvolemic.  Lengthy discussion however regarding the prognostic implications of cardiomyopathy in an effort to try to engage her in greater compliance.  She voiced understanding and is agreeable.  Hence, we will begin with an echocardiogram to reassess left ventricular function.  Moreover, we will begin her on carvedilol 3.125 twice daily, parenthetically she mentions that she had problems with hypotension prompting the discontinuation of beta-blockers in the past.  In 6-8 weeks, we will see her again with anticipation of adding spironolactone, cost being an issue in the incremental benefits of the spironolactone as opposed to [facility] over an ACE inhibitor.  She may well need to be considered for a rePete procedure to attempt resynchronization either with a left bundle branch block lead or a LV lead.  Check electrolytes and digoxin level.  Last visit digoxin is the best long-term) reasonable for now.

## 2020-07-11 LAB — ECHOCARDIOGRAM COMPLETE
AR max vel: 2.47 cm2
AV Area VTI: 2.26 cm2
AV Area mean vel: 2.29 cm2
AV Mean grad: 4 mmHg
AV Peak grad: 8 mmHg
Ao pk vel: 1.41 m/s
Area-P 1/2: 5.16 cm2
Calc EF: 26.9 %
Height: 65 in
S' Lateral: 5.4 cm
Single Plane A2C EF: 25.8 %
Single Plane A4C EF: 27.6 %
Weight: 3042 oz

## 2020-07-11 LAB — BASIC METABOLIC PANEL
BUN/Creatinine Ratio: 15 (ref 12–28)
BUN: 11 mg/dL (ref 8–27)
CO2: 20 mmol/L (ref 20–29)
Calcium: 10 mg/dL (ref 8.7–10.3)
Chloride: 102 mmol/L (ref 96–106)
Creatinine, Ser: 0.75 mg/dL (ref 0.57–1.00)
Glucose: 112 mg/dL — ABNORMAL HIGH (ref 65–99)
Potassium: 4.9 mmol/L (ref 3.5–5.2)
Sodium: 141 mmol/L (ref 134–144)
eGFR: 90 mL/min/{1.73_m2} (ref >59)

## 2020-07-11 LAB — DIGOXIN LEVEL: Digoxin, Serum: 0.9 ng/mL (ref 0.5–0.9)

## 2020-07-31 ENCOUNTER — Ambulatory Visit (INDEPENDENT_AMBULATORY_CARE_PROVIDER_SITE_OTHER): Payer: BC Managed Care – PPO

## 2020-07-31 DIAGNOSIS — I442 Atrioventricular block, complete: Secondary | ICD-10-CM | POA: Diagnosis not present

## 2020-07-31 LAB — CUP PACEART REMOTE DEVICE CHECK
Battery Remaining Longevity: 98 mo
Battery Remaining Percentage: 89 %
Battery Voltage: 2.95 V
Brady Statistic AP VP Percent: 20 %
Brady Statistic AP VS Percent: 1 %
Brady Statistic AS VP Percent: 80 %
Brady Statistic AS VS Percent: 1 %
Brady Statistic RA Percent Paced: 19 %
Brady Statistic RV Percent Paced: 99 %
Date Time Interrogation Session: 20220418081228
Implantable Lead Implant Date: 20160824
Implantable Lead Implant Date: 20160824
Implantable Lead Location: 753859
Implantable Lead Location: 753860
Implantable Pulse Generator Implant Date: 20160824
Lead Channel Impedance Value: 380 Ohm
Lead Channel Impedance Value: 450 Ohm
Lead Channel Pacing Threshold Amplitude: 0.5 V
Lead Channel Pacing Threshold Amplitude: 0.625 V
Lead Channel Pacing Threshold Pulse Width: 0.5 ms
Lead Channel Pacing Threshold Pulse Width: 0.5 ms
Lead Channel Sensing Intrinsic Amplitude: 10.6 mV
Lead Channel Sensing Intrinsic Amplitude: 4.4 mV
Lead Channel Setting Pacing Amplitude: 1.5 V
Lead Channel Setting Pacing Amplitude: 2 V
Lead Channel Setting Pacing Pulse Width: 0.5 ms
Lead Channel Setting Sensing Sensitivity: 2.5 mV
Pulse Gen Model: 3222
Pulse Gen Serial Number: 7802897

## 2020-08-16 ENCOUNTER — Other Ambulatory Visit: Payer: Self-pay

## 2020-08-16 ENCOUNTER — Encounter: Payer: Self-pay | Admitting: Nurse Practitioner

## 2020-08-16 ENCOUNTER — Ambulatory Visit: Payer: BC Managed Care – PPO | Admitting: Nurse Practitioner

## 2020-08-16 VITALS — BP 130/60 | HR 72 | Ht 65.0 in | Wt 189.0 lb

## 2020-08-16 DIAGNOSIS — I5020 Unspecified systolic (congestive) heart failure: Secondary | ICD-10-CM | POA: Diagnosis not present

## 2020-08-16 DIAGNOSIS — I313 Pericardial effusion (noninflammatory): Secondary | ICD-10-CM

## 2020-08-16 DIAGNOSIS — I3139 Other pericardial effusion (noninflammatory): Secondary | ICD-10-CM

## 2020-08-16 DIAGNOSIS — I10 Essential (primary) hypertension: Secondary | ICD-10-CM

## 2020-08-16 DIAGNOSIS — I428 Other cardiomyopathies: Secondary | ICD-10-CM

## 2020-08-16 DIAGNOSIS — I2721 Secondary pulmonary arterial hypertension: Secondary | ICD-10-CM

## 2020-08-16 DIAGNOSIS — E782 Mixed hyperlipidemia: Secondary | ICD-10-CM

## 2020-08-16 DIAGNOSIS — R400 Somnolence: Secondary | ICD-10-CM

## 2020-08-16 MED ORDER — SPIRONOLACTONE 25 MG PO TABS: 12.5000 mg | ORAL_TABLET | Freq: Every day | ORAL | 3 refills | Status: DC

## 2020-08-16 NOTE — Progress Notes (Signed)
Office Visit    Patient Name: [name] [name] Date of Encounter: 08/16/2020  Primary Care Provider:  Gladstone Lighter, MD Primary Cardiologist:  Virl Axe, MD  Chief Complaint    65 year old female with a history of congenital complete heart block status post permanent pacemaker implantation (initially 1990s), chronic combined systolic and diastolic congestive heart failure, nonischemic cardiomyopathy, hypertension, hyperlipidemia, hypothyroidism, anxiety, arthritis, GERD, ovarian cancer, gout, and pericardial effusion, who presents for follow-up related to heart failure.  Past Medical History    Past Medical History:  Diagnosis Date  . Anxiety    takes Klonopin daily as needed  . Arthritis   . Back pain    reason unknown  . Chronic systolic CHF (congestive heart failure) ([place])    a. echo 10/2013: EF 40-45 (apical images 35-40%), diff HK, mild MR, PASP nl; b. echo 12/2014: EF 35-40%, no RWMA, GR1DD, mod TR, PASP nl; c. 06/2020 Echo: EF 25-30%, glob HK, Gr2 DD, GLS -6.1%, Nl RV fxn. RVSP ~ 62mmHg. Mildly dil LA. Mod circumferential pericardial effusion w/o tamponade. Mild-mod Ao sclerosis.  . Complete AV block ([place])    a. s/p pacemaker insertion 1996; b. s/p contralateral side St. Jude generator change 11/2014.  . Dizziness    occasionally  . GERD (gastroesophageal reflux disease)    takes Zantac daily as needed  . Gout   . Headache    occasionally  . History of blood clots    in heart-was on Coumadin---this many yrs ago  . History of colon polyps    benign  . Hyperlipidemia    takes Lovastatin daily  . Hypertension    hx of-since hip replacement MD took resident off meds 10/30/14 b/c well controlled  . Hypothyroid    takes Synthroid daily  . Joint pain   . Joint swelling   . Moderate Pericardial effusion    a. 07/2020 Echo: Mod effusion w/o tamponade.  Marland Kitchen NICM (nonischemic cardiomyopathy) ([place])    a. tachycardia induced->EF 40-45% (2010), 35-40% (10/2013), 35-40% Gr1 DD,  mod TR (12/2014);  b. 01/2015 Cath: Nl cors, EF 30-35%, glob HK; c. 06/2020 Echo: Ef 25-30%, glob HK, gr2 DD.  . Numbness and tingling    left leg and right arm  . Ovarian cancer (HCC)    ovarian, skin   . 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 Peripheral artery disease ([place])    stent on lt illiac   Past Surgical History:  Procedure Laterality Date  . ABDOMINAL HYSTERECTOMY    . ANGIOPLASTY / STENTING ILIAC    . CARDIAC CATHETERIZATION N/A 02/08/2015   Procedure: Left Heart Cath and Coronary Angiography;  Surgeon: Wellington Hampshire, MD;  Location: [place] [facility];  Service: Cardiovascular;  Laterality: N/A;  . CERVICAL CERCLAGE  79yrs ago  . COLONOSCOPY    . DG BARIUM SWALLOW ([place] HX)    . DILATION AND CURETTAGE OF UTERUS    . EP IMPLANTABLE DEVICE Left 12/06/2014   Procedure: Implantation of cardiac resynchronization therapy pacemaker;  Surgeon: Evans Lance, MD;  Location: [place];  Service: Cardiovascular;  Laterality: Left;  . HERNIA MESH REMOVAL Right    incisional  . HERNIA REPAIR    . ILIAC ARTERY STENT  Apr 05, 2010   Left artery stenting  . INSERT / REPLACE / REMOVE PACEMAKER    . JOINT REPLACEMENT  October 31, 2014   hip  . KNEE SURGERY Right   .  PACEMAKER INSERTION  2012   Had replaced in July 2012, first placed in 1996  . PACEMAKER LEAD REMOVAL Right 12/06/2014   Procedure: REMOVAL OF RV AND RA PACEMAKER LEADS;  Surgeon: Evans Lance, MD;  Location: [place];  Service: Cardiovascular;  Laterality: Right;  DR. BARTLE TO BACK UP   . PACEMAKER PLACEMENT Left 12/06/14  . TOTAL ABDOMINAL HYSTERECTOMY W/ BILATERAL SALPINGOOPHORECTOMY  07/2008   Ovarian cancer  . TOTAL HIP ARTHROPLASTY Right 10/31/2014   Procedure: TOTAL HIP ARTHROPLASTY ANTERIOR APPROACH;  Surgeon: Hessie Knows, MD;  Location: [facility];  Service: Orthopedics;  Laterality: Right;    Allergies  Allergies  Allergen Reactions  .  Marshmallow [Althaea Officinalis]     Rash, hives, itching, SOB  . Meat Extract     Rash, hives itching, SOB.  Can tolerate some meats ONLY in moderation  . Other     Jello - Rash, hives, itching, SOB  . Contrast Media [Iodinated Diagnostic Agents]     unknown  . Latex     rash  . Nickel     rash    History of Present Illness    65 year old female with the above complex past medical history including congenital complete heart block status post permanent pacemaker placement, initially in the 1990s with most recent change out in July 2016 (inability to place LV lead-CRT-P), chronic combined systolic and diastolic congestive heart failure, nonischemic cardiomyopathy, hypertension, hyperlipidemia, hypothyroidism, anxiety, arthritis, GERD, ovarian cancer, gout, and pericardial effusion.  She was initially diagnosed with tachycardia due to cardiomyopathy in 2010 with an EF of 40 to 45% at that time.  EF has since remained depressed.  She underwent diagnostic catheterization in October 2016, which showed normal coronary arteries and an EF of 30-35%.  Medical management of cardiomyopathy has been complicated over the years in the setting of hypotension prompting discontinuation of beta-blockers in the past.  At her most recent visit on March 29, she was euvolemic on examination.  An echocardiogram earlier that day showed further reduction in LV function to 25-30%.  RVSP was measured at 50 mmHg.  Low-dose carvedilol-3.125 mg twice daily was added with a plan to add spironolactone next.  Since her last visit, Ms. Toothman has done well.  She has been tolerating carvedilol without orthostatic symptoms.  She denies chest pain, dyspnea, palpitations, PND, orthopnea, dizziness, syncope, edema, or early satiety.  In discussion today, she mentioned that she has daytime somnolence.  She does not sleep well as her husband snores and she believes she does too.  She is interested in pursuing a sleep study.  Home  Medications    Prior to Admission medications   Medication Sig Start Date End Date Taking? Authorizing Provider  Ascorbic Acid (VITAMIN C) 1000 MG tablet Take 1,000 mg by mouth daily.    [provider]  aspirin EC 81 MG tablet Take 81 mg by mouth daily.    [provider]  benazepril (LOTENSIN) 5 MG tablet Take 1 tablet (5 mg total) by mouth daily. 09/15/19   Volney American, PA-C  carvedilol (COREG) 3.125 MG tablet Take 1 tablet (3.125 mg total) by mouth 2 (two) times daily with a meal. 07/10/20   Deboraha Sprang, MD  clonazePAM (KLONOPIN) 0.5 MG tablet TAKE 1 TABLET BY MOUTH ONCE DAILY AS NEEDED FOR ANXIETY/ SLEEP 09/07/19   Volney American, PA-C  digoxin (LANOXIN) 0.125 MG tablet Take 1 tablet (125 mcg total) by mouth daily. 09/15/19   Orene Desanctis,  Lilia Argue, PA-C  ergocalciferol (VITAMIN D2) 1.25 MG (50000 UT) capsule Take 50,000 Units by mouth once a week. 04/23/20   [provider]  levothyroxine (SYNTHROID) 112 MCG tablet Take 1 tablet by mouth once daily 04/28/20   Cannady, Henrine Screws T, NP  lovastatin (MEVACOR) 20 MG tablet TAKE 1 TABLET BY MOUTH AT BEDTIME 03/29/20   Johnson, Megan P, DO  zinc gluconate 50 MG tablet Take 50 mg by mouth daily.    [provider]    Review of Systems    She denies chest pain, palpitations, dyspnea, pnd, orthopnea, n, v, dizziness, syncope, edema, weight gain, or early satiety.  Daytime somnolence and poor sleep at night reported.  All other systems reviewed and are otherwise negative except as noted above.  Physical Exam    VS:  BP 130/60 (BP Location: Left Arm, Patient Position: Sitting, Cuff Size: Large)   Pulse 72   Ht 5\' 5"  (1.651 m)   Wt 189 lb (85.7 kg)   LMP  (LMP Unknown)   SpO2 95%   BMI 31.45 kg/m  , BMI Body mass index is 31.45 kg/m. GEN: Well nourished, well developed, in no acute distress. HEENT: normal. Neck: Supple, no JVD, carotid bruits, or masses. Cardiac: RRR, 1/6 systolic murmur at  the upper sternal borders, no rubs, or gallops. No clubbing, cyanosis, edema.  Radials/PT 2+ and equal bilaterally.  Respiratory:  Respirations regular and unlabored, clear to auscultation bilaterally. GI: Soft, nontender, nondistended, BS + x 4. MS: no deformity or atrophy. Skin: warm and dry, no rash. Neuro:  Strength and sensation are intact. Psych: Normal affect.  Accessory Clinical Findings    ECG personally reviewed by me today -a sensed, V paced, 72 - no acute changes.  Lab Results  Component Value Date   WBC 5.0 03/16/2019   HGB 15.1 03/16/2019   HCT 45.0 03/16/2019   MCV 94 03/16/2019   PLT 184 03/16/2019   Lab Results  Component Value Date   CREATININE 0.75 07/10/2020   BUN 11 07/10/2020   NA 141 07/10/2020   K 4.9 07/10/2020   CL 102 07/10/2020   CO2 20 07/10/2020   Lab Results  Component Value Date   ALT 16 09/15/2019   AST 22 09/15/2019   ALKPHOS 101 09/15/2019   BILITOT 0.3 09/15/2019   Lab Results  Component Value Date   CHOL 161 09/15/2019   HDL 51 09/15/2019   LDLCALC 93 09/15/2019   TRIG 91 09/15/2019    Assessment & Plan    1.  Chronic HFrEF/nonischemic cardiomyopathy: Patient with a history of LV dysfunction dating back to at least July 2015, when EF was 35 to 40%.  This had been stable but on recent echocardiogram in March of this year, EF down to 25-30% with global hypokinesis, grade 2 diastolic dysfunction, and RVSP of 50 mmHg.  Moderate pericardial effusion was also noted.  Despite LV dysfunction, she has been doing well and is euvolemic on examination.  At her last visit, carvedilol therapy was added and she has tolerated thus far.  She has not experienced any orthostatic lightheadedness and vital signs are stable today.  I am going to take this opportunity to add spironolactone 12.5 mg daily.  Plan for follow-up basic metabolic panel in 1 week.  I will plan to see her back in 1 month to assess appropriateness of titrating spironolactone further.   As cost is a significant issue for her we will leave her on ACE inhibitor  therapy rather than considering transition to [facility].  Continue digoxin (level was 0.9 March 29).  Will need follow-up limited echo in the next 1 to 2 months to reevaluate pericardial effusion and LV function determine next course of action, potential to include reattempt at CRT P versus D if appropriate.   . 2.  Essential hypertension: Stable.  Continue beta-blocker and ACE inhibitor therapy.  Adding spironolactone in the setting of above.  3.  Moderate pericardial effusion: Noted on echocardiogram.  No tamponade physiology.  No rub on exam.  Asymptomatic.  We will plan limited echo follow-up in about a month.  4.  Pulmonary hypertension: RVSP of 50 mmHg on recent echo.  Euvolemic on examination and without significant dyspnea.  Optimizing medical therapy as above.  Adding spironolactone today.  She does report daytime somnolence as well as snoring and overall poor sleep.  We will try and arrange for a WatchPat One today.  5.  Hyperlipidemia: Continue statin therapy.  6.  Daytime somnolence: As above, patient has been experiencing daytime somnolence and admits to history of snoring and overall disrupted sleep.  We will arrange for at home sleep study.  7.  Disposition: Follow-up basic metabolic panel in 1 week.  Follow-up in clinic in about a month with plan for limited echo to be ordered at that time.  Murray Hodgkins, NP 08/16/2020, 9:55 AM

## 2020-08-16 NOTE — Progress Notes (Signed)
STOP-BANG Rex assessment  S-snore  Have you been told that you snore?   Yes  T-tired Are you often tired, fatigued, sleepy during the day?   Yes  O-obstruction Do you stop breathing, choke, or gasp during sleep?   No  P-pressure Do you have or are you being treated for high blood pressure?  Yes  B- BMI Is your BMI greater than 35 kg/m?  No  A- age Are you 34 years or older?  Yes  N-neck Do you have a neck circumference greater than 16 inches?  No  G-gender Are you female?   No  Total  4   Risk Score <3 Low risk of OSA ?3 High risk of OSA

## 2020-08-16 NOTE — Patient Instructions (Addendum)
Medication Instructions:  Your physician has recommended you make the following change in your medication:   1. START Spironolactone 25 mg take half a tablet (12.5 mg) once daily  *If you need a refill on your cardiac medications before your next appointment, please call your pharmacy*   Lab Work: BMET in one week at the [facility]. No appointment is needed for this but please check with registration.   If you have labs (blood work) drawn today and your tests are completely normal, you will receive your results only by: Marland Kitchen MyChart Message (if you have MyChart) OR . A paper copy in the mail If you have any lab test that is abnormal or we need to change your treatment, we will call you to review the results.   Testing/Procedures: WatchPAT?  Is a FDA cleared portable home sleep study test that uses a watch and 3 points of contact to monitor 7 different channels, including your heart rate, oxygen saturations, body position, snoring, and chest motion.  The study is easy to use from the comfort of your own home and accurately detect sleep apnea.  Before bed, you attach the chest sensor, attached the sleep apnea bracelet to your nondominant hand, and attach the finger probe.  After the study, the raw data is downloaded from the watch and scored for apnea events.   For more information: [email or web address]   Follow-Up: At [facility], you and your health needs are our priority.  As part of our continuing mission to provide you with exceptional heart care, we have created designated Provider Care Teams.  These Care Teams include your primary Cardiologist (physician) and Advanced Practice Providers (APPs -  Physician Assistants and Nurse Practitioners) who all work together to provide you with the care you need, when you need it.  Your next appointment:   1 month(s)  The format for your next appointment:   In Person  Provider:    Keep appointment already  scheduled with Virl Axe MD, and schedule appointment for one month with Murray Hodgkins NP

## 2020-08-17 NOTE — Progress Notes (Signed)
Remote pacemaker transmission.   

## 2020-08-23 ENCOUNTER — Other Ambulatory Visit
Admission: RE | Admit: 2020-08-23 | Discharge: 2020-08-23 | Disposition: A | Discharge status: Home / Self Care | Payer: BC Managed Care – PPO | Source: Ambulatory Visit | Attending: Nurse Practitioner | Admitting: Nurse Practitioner

## 2020-08-23 DIAGNOSIS — I5020 Unspecified systolic (congestive) heart failure: Secondary | ICD-10-CM | POA: Diagnosis not present

## 2020-08-23 DIAGNOSIS — I313 Pericardial effusion (noninflammatory): Secondary | ICD-10-CM | POA: Insufficient documentation

## 2020-08-23 DIAGNOSIS — I428 Other cardiomyopathies: Secondary | ICD-10-CM

## 2020-08-23 DIAGNOSIS — I3139 Other pericardial effusion (noninflammatory): Secondary | ICD-10-CM

## 2020-08-23 DIAGNOSIS — E782 Mixed hyperlipidemia: Secondary | ICD-10-CM

## 2020-08-23 DIAGNOSIS — I2721 Secondary pulmonary arterial hypertension: Secondary | ICD-10-CM | POA: Diagnosis not present

## 2020-08-23 DIAGNOSIS — R400 Somnolence: Secondary | ICD-10-CM | POA: Insufficient documentation

## 2020-08-23 DIAGNOSIS — I10 Essential (primary) hypertension: Secondary | ICD-10-CM

## 2020-08-23 LAB — BASIC METABOLIC PANEL
Anion gap: 8 (ref 5–15)
BUN: 17 mg/dL (ref 8–23)
CO2: 28 mmol/L (ref 22–32)
Calcium: 9.6 mg/dL (ref 8.9–10.3)
Chloride: 98 mmol/L (ref 98–111)
Creatinine, Ser: 0.83 mg/dL (ref 0.44–1.00)
GFR, Estimated: >60 mL/min (ref >60)
Glucose, Bld: 107 mg/dL — ABNORMAL HIGH (ref 70–99)
Potassium: 4.3 mmol/L (ref 3.5–5.1)
Sodium: 134 mmol/L — ABNORMAL LOW (ref 135–145)

## 2020-08-24 ENCOUNTER — Other Ambulatory Visit: Payer: Self-pay | Admitting: *Deleted

## 2020-08-24 DIAGNOSIS — I5020 Unspecified systolic (congestive) heart failure: Secondary | ICD-10-CM

## 2020-08-24 DIAGNOSIS — I5022 Chronic systolic (congestive) heart failure: Secondary | ICD-10-CM

## 2020-08-24 DIAGNOSIS — I428 Other cardiomyopathies: Secondary | ICD-10-CM

## 2020-08-30 ENCOUNTER — Other Ambulatory Visit: Payer: Self-pay

## 2020-08-30 ENCOUNTER — Telehealth: Payer: Self-pay

## 2020-08-30 ENCOUNTER — Telehealth: Payer: Self-pay | Admitting: Internal Medicine

## 2020-08-30 ENCOUNTER — Other Ambulatory Visit: Payer: Self-pay | Admitting: Nurse Practitioner

## 2020-08-30 MED ORDER — SPIRONOLACTONE 25 MG PO TABS: 12.5000 mg | ORAL_TABLET | Freq: Every day | ORAL | 0 refills | Status: DC

## 2020-08-30 NOTE — Telephone Encounter (Signed)
pironolactone (ALDACTONE) 25 MG tablet 1 tablet 0 08/30/2020 11/28/2020   Sig - Route: Take 0.5 tablets (12.5 mg total) by mouth daily. - Oral   Sent to pharmacy as: spironolactone (ALDACTONE) 25 MG tablet   E-Prescribing Status: Receipt confirmed by pharmacy (08/30/2020  8:35 AM EDT)     Pharmacy  [id number] [place], [place] - [address]

## 2020-08-30 NOTE — Telephone Encounter (Signed)
*  STAT* If patient is at the pharmacy, call can be transferred to refill team.   1. Which medications need to be refilled? (please list name of each medication and dose if known) carvedilol 3.125 mg bid, spironolactone 12.5 mg   2. Which pharmacy/location (including street and city if local pharmacy) is [facility] sent to[facility] [place]  3. Do they need a 30 day or 90 day supply?90  Patient left for work without taking some of her medications. Patient is hoping that she may be able to get one dose of her spironolactone sent to the pharmacy this morning

## 2020-08-30 NOTE — Telephone Encounter (Signed)
Spoke with patient and reviewed that we are waiting for authorization and that I did reach out to that team for follow up. Advised that I would call once I hear back from them and she verbalized understanding with no further questions.

## 2020-08-30 NOTE — Telephone Encounter (Signed)
Pharmacy called to verify that we meant to only send in one tablet.  Called patient to verify with her that she only needed one tablet and she said yes because she forgot to take her medication this morning and didn't want to miss a dose. Patient also stated that she had already picked that one tablet up.   Patient then stated that she was recently seen by Ignacia Bayley, NP and was given a watchpat sleep study.  She stated that she has not been contacted with the PIN to activate it and she has not opened the box because she was told not to open the box until we got the approval from insurance.   I made her aware that is correct, Not to open the package and that I would send a message to check on insurance authorization.  Patient will be out of town this weekend and does not wish to wear it this weekend but would be willing to wear it anytime after.

## 2020-08-30 NOTE — Telephone Encounter (Signed)
Error

## 2020-08-30 NOTE — Telephone Encounter (Signed)
See below

## 2020-09-03 ENCOUNTER — Telehealth: Payer: Self-pay

## 2020-09-03 ENCOUNTER — Telehealth: Payer: Self-pay | Admitting: *Deleted

## 2020-09-03 NOTE — Telephone Encounter (Signed)
Message sent to sleep pool to follow up on her [place].

## 2020-09-03 NOTE — Telephone Encounter (Signed)
-----   Message from Chapman Moss, RN sent at 09/03/2020  4:51 PM EDT ----- Regarding: RE: Salvatore Marvel BCBS has authorized [employer] sleep study.  Please notify patient to proceed with study.  Thank you ----- Message ----- From: Valora Corporal, RN Sent: 08/30/2020   3:23 PM EDT To: [facility] Subject: WatchPat                                       Routing History  From: Valora Corporal, RN On: 08/16/2020 11:21 AM To: [facility] Pre Cert/Auth (Pool) Priority: Routine Routing Comments: Retail buyer  Stop bang [place] office  Please let me know if you have any questions.   From: Valora Corporal, RN On: 08/17/2020 01:23 PM To: [facility] (Pool) Priority: Routine Routing Comments: Conservation officer, nature for [employer] device.

## 2020-09-03 NOTE — Telephone Encounter (Signed)
BCBS authorized CPT 95800 dates of service 5.23.22 thru 7.21.22. Order# [id number].   Message to [facility] A to advise patient of insurance determination.

## 2020-09-03 NOTE — Telephone Encounter (Signed)
Left voicemail message that she has been approved to use WatchPat with her code being [id number] and to call back if she has any questions.

## 2020-09-03 NOTE — Telephone Encounter (Signed)
See other telephone encounter. Left voicemail message that she can proceed with her WatchPat with pin number of [id number]

## 2020-09-05 ENCOUNTER — Other Ambulatory Visit: Payer: Self-pay | Admitting: Internal Medicine

## 2020-09-05 MED ORDER — CARVEDILOL 3.125 MG PO TABS: 3.1250 mg | ORAL_TABLET | Freq: Two times a day (BID) | ORAL | 0 refills | Status: DC

## 2020-09-05 NOTE — Telephone Encounter (Signed)
This is a [place] pt 

## 2020-09-05 NOTE — Telephone Encounter (Signed)
*  STAT* If patient is at the pharmacy, call can be transferred to refill team.   1. Which medications need to be refilled? (please list name of each medication and dose if known) benazepril and carvedilol   2. Which pharmacy/location (including street and city if local pharmacy) is medication to be sent to? [facility] [place]  3. Do they need a 30 day or 90 day supply? [place]

## 2020-09-05 NOTE — Telephone Encounter (Signed)
Rx request sent to pharmacy.  

## 2020-09-05 NOTE — Telephone Encounter (Signed)
Requested Prescriptions   Pending Prescriptions Disp Refills   benazepril (LOTENSIN) 5 MG tablet 90 tablet 0    Sig: Take 1 tablet (5 mg total) by mouth daily.   Signed Prescriptions Disp Refills   carvedilol (COREG) 3.125 MG tablet 180 tablet 0    Sig: Take 1 tablet (3.125 mg total) by mouth 2 (two) times daily with a meal.    Authorizing Provider: Deboraha Sprang    Ordering User: Britt Bottom

## 2020-09-12 ENCOUNTER — Telehealth: Payer: Self-pay | Admitting: Nurse Practitioner

## 2020-09-12 NOTE — Telephone Encounter (Signed)
Spoke with the patient.  Per CP 5/13 bmp Sodium minimally low, but otherwise Kidney function and electrolytes are normal since starting spironolactone. Rec f/u bmet in 2 wks to ensure stability. (Sodium 134) Patients that she would like to wait until she is seen on 67/22 by Ignacia Bayley, NP before repeating lab work.  Patient sts that she has had to have several labs drawn recently and the cost has added up. Adv the patient that I will fwd the update to [place].  Patient is also inquiring on her WatchPAT results. Adv her that they are read by one of our cardiologist that specializes in sleep disorders. I will fwd the msg to Chris's nurse Romualdo Bolk, RN to f/u.

## 2020-09-12 NOTE — Telephone Encounter (Signed)
Patient would like to know if she needs to have her labs drawn before her appointment on 6/7.  Patient would also like results from her sleep study. Please call and advise.

## 2020-09-13 ENCOUNTER — Ambulatory Visit: Payer: BC Managed Care – PPO

## 2020-09-13 ENCOUNTER — Encounter (INDEPENDENT_AMBULATORY_CARE_PROVIDER_SITE_OTHER): Payer: BC Managed Care – PPO | Admitting: Cardiology

## 2020-09-13 DIAGNOSIS — G4700 Insomnia, unspecified: Secondary | ICD-10-CM

## 2020-09-13 DIAGNOSIS — I10 Essential (primary) hypertension: Secondary | ICD-10-CM

## 2020-09-13 DIAGNOSIS — I3139 Other pericardial effusion (noninflammatory): Secondary | ICD-10-CM

## 2020-09-13 DIAGNOSIS — I313 Pericardial effusion (noninflammatory): Secondary | ICD-10-CM

## 2020-09-13 DIAGNOSIS — I2721 Secondary pulmonary arterial hypertension: Secondary | ICD-10-CM

## 2020-09-13 DIAGNOSIS — R400 Somnolence: Secondary | ICD-10-CM

## 2020-09-13 DIAGNOSIS — I428 Other cardiomyopathies: Secondary | ICD-10-CM

## 2020-09-13 DIAGNOSIS — E782 Mixed hyperlipidemia: Secondary | ICD-10-CM

## 2020-09-13 DIAGNOSIS — I5020 Unspecified systolic (congestive) heart failure: Secondary | ICD-10-CM

## 2020-09-13 NOTE — Telephone Encounter (Signed)
Spoke with patient and she states her insurance did not pay well for the labs done at the [facility]. She requested to wait for her appointment and have them done at that visit. She did inquire about her sleep device results and advised that they are not back yet and it may take some time for them to result. She verbalized understanding of conversation, confirmed upcoming appointment, and had no further questions at this time.

## 2020-09-13 NOTE — Procedures (Addendum)
   Sleep Study Report  Patient Information Name: [name] [name] ID: [id number] Birth Date: July 21, 1955  Age: 65  Gender: Female Insurer: BCBS BMI: 31.6 (W=189 lb, H=5' 5'') Study Date: 09/07/2020 Referring Physician:  Murray Hodgkins, NP  TEST DESCRIPTION: Home sleep apnea testing was completed using the WatchPat, a Type 1 device, utilizing  peripheral arterial tonometry (PAT), chest movement, actigraphy, pulse oximetry, pulse rate, body position and snore.  AHI was calculated with apnea and hypopnea using valid sleep time as the denominator. RDI includes apneas,  hypopneas, and RERAs. The data acquired and the scoring of sleep and all associated events were performed in  accordance with the recommended standards and specifications as outlined in the AASM Manual for the Scoring of  Sleep and Associated Events 2.2.0 (2015).   FINDINGS: 1. No evidence of Obstructive Sleep Apnea with AHI 4.4/hr.  2. No Central Sleep Apnea. 3. Oxygen desaturations as low as 88%. 4. Mild snoring was present. O2 sats were < 88% for 0.32minutes. 5. Total sleep time was 7 hrs and 33 min. 6. 28% of total sleep time was spent in REM sleep.  7. Normal sleep onset latency at 20 min.  8. Shortened REM sleep onset latency at 57 min.  9. Total awakenings were 4.   DIAGNOSIS:  Normal study with no significant sleep disordered breathing.  RECOMMENDATIONS: 1. Normal study with no significant sleep disordered breathing.  2. Healthy sleep recommendations include: adequate nightly sleep (normal 7-9 hrs/night), avoidance of caffeine after  noon and alcohol near bedtime, and maintaining a sleep environment that is cool, dark and quiet.  3. Weight loss for overweight patients is recommended.   4. Snoring recommendations include: weight loss where appropriate, side sleeping, and avoidance of alcohol before  Bed.  5. Operation of motor vehicle or dangerous equipment must be avoided when feeling drowsy, excessively  sleepy, or  mentally fatigued.   6. An ENT consultation which may be useful for specific causes of and possible treatment of bothersome snoring .   7. Weight loss may be of benefit in reducing the severity of snoring.   Report prepared by: Signature: Fransico Him, MD; [facility]; [place], [place] Board of Sleep Medicine  Electronically Signed: Sep 13, 2020

## 2020-09-17 NOTE — Progress Notes (Signed)
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  Past Medical History    Past Medical History:  Diagnosis Date  . Anxiety    takes Klonopin daily as needed  . Arthritis   . Back pain    reason unknown  . Chronic systolic CHF (congestive heart failure) ([place])    a. echo 10/2013: EF 40-45 (apical images 35-40%), diff HK, mild MR, PASP nl; b. echo 12/2014: EF 35-40%, no RWMA, GR1DD, mod TR, PASP nl; c. 06/2020 Echo: EF 25-30%, glob HK, Gr2 DD, GLS -6.1%, Nl RV fxn. RVSP ~ 24mmHg. Mildly dil LA. Mod circumferential pericardial effusion w/o tamponade. Mild-mod Ao sclerosis.  . Complete AV block ([place])    a. s/p pacemaker insertion 1996; b. s/p contralateral side St. Jude generator change 11/2014.  . Dizziness    occasionally  . GERD (gastroesophageal reflux disease)    takes Zantac daily as needed  . Gout   . Headache    occasionally  . History of blood clots    in heart-was on Coumadin---this many yrs ago  . History of colon polyps    benign  . Hyperlipidemia    takes Lovastatin daily  . Hypertension    hx of-since hip replacement MD took resident off meds 10/30/14 b/c well controlled  . Hypothyroid    takes Synthroid daily  . Joint pain   . Joint swelling   . Moderate Pericardial effusion    a. 07/2020 Echo: Mod effusion w/o tamponade.  Marland Kitchen NICM (nonischemic cardiomyopathy) ([place])    a. tachycardia induced->EF 40-45% (2010), 35-40% (10/2013), 35-40% Gr1 DD,  mod TR (12/2014);  b. 01/2015 Cath: Nl cors, EF 30-35%, glob HK; c. 06/2020 Echo: Ef 25-30%, glob HK, gr2 DD.  . Numbness and tingling    left leg and right arm  . Ovarian cancer (HCC)    ovarian, skin   . 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 Peripheral artery disease ([place])    stent on lt illiac   Past Surgical History:  Procedure Laterality Date  . ABDOMINAL HYSTERECTOMY    . ANGIOPLASTY / STENTING ILIAC    . CARDIAC CATHETERIZATION N/A 02/08/2015   Procedure: Left Heart Cath and Coronary Angiography;  Surgeon: Wellington Hampshire, MD;  Location: [facility];  Service: Cardiovascular;  Laterality: N/A;  . CERVICAL CERCLAGE  25yrs ago  . COLONOSCOPY    . DG BARIUM SWALLOW ([place] HX)    . DILATION AND CURETTAGE OF UTERUS    . EP IMPLANTABLE DEVICE Left 12/06/2014   Procedure: Implantation of cardiac resynchronization therapy pacemaker;  Surgeon: Evans Lance, MD;  Location: [facility];  Service: Cardiovascular;  Laterality: Left;  . HERNIA MESH REMOVAL Right    incisional  . HERNIA REPAIR    . ILIAC ARTERY STENT  Apr 05, 2010   Left artery stenting  . INSERT / REPLACE / REMOVE PACEMAKER    . JOINT REPLACEMENT  October 31, 2014   hip  . KNEE SURGERY Right   .  PACEMAKER INSERTION  2012   Had replaced in July 2012, first placed in 1996  . PACEMAKER LEAD REMOVAL Right 12/06/2014   Procedure: REMOVAL OF RV AND RA PACEMAKER LEADS;  Surgeon: Evans Lance, MD;  Location: [facility];  Service: Cardiovascular;  Laterality: Right;  DR. BARTLE TO BACK UP   . PACEMAKER PLACEMENT Left 12/06/14  . TOTAL ABDOMINAL HYSTERECTOMY W/ BILATERAL SALPINGOOPHORECTOMY  07/2008   Ovarian cancer  . TOTAL HIP ARTHROPLASTY Right 10/31/2014   Procedure: TOTAL HIP ARTHROPLASTY ANTERIOR APPROACH;  Surgeon: Hessie Knows, MD;  Location: [facility];  Service: Orthopedics;  Laterality: Right;    Allergies  Allergies  Allergen Reactions  .  Marshmallow [Althaea Officinalis]     Rash, hives, itching, SOB  . Meat Extract     Rash, hives itching, SOB.  Can tolerate some meats ONLY in moderation  . Other     Jello - Rash, hives, itching, SOB  . Contrast Media [Iodinated Diagnostic Agents]     unknown  . Latex     rash  . Nickel     rash    History of Present Illness    65 year old female with the above complex past medical history including congenital complete heart block status post permanent pacemaker placement, initially in the 1990s with most recent change out in July 2016 (inability to place LV lead-CRT-P), chronic combined systolic and diastolic congestive heart failure, nonischemic cardiomyopathy, hypertension, hyperlipidemia, hypothyroidism, anxiety, arthritis, GERD, ovarian cancer, gout, and pericardial effusion.  She was initially diagnosed with tachycardia mediated cardiomyopathy in 2010 with an EF of 40 to 45% at that time.  EF has since remained depressed.  She underwent diagnostic catheterization in October 2016, which showed normal coronary arteries and an EF of 30-35%.  Medical management of cardiomyopathy has been complicated over the years in the setting of hypotension prompting discontinuation of beta-blockers in the past.  Echo in March showed further reduction in LV function to 25-30%.  RVSP was measured at 50 mmHg.  Low-dose carvedilol-3.125 mg twice daily was added with a plan to added at that time.  She was doing well @ 5/5 /2022 visit w/ stable BP and w/o Ss of orthostasis, therefor, spironolactone 12.5mg  daily was added.  In the setting of [place] on echo and report of daytime somnolence, I arranged for a sleep study, which has since returned nl.  Since her last visit she has done quite well.  She has been tolerating both carvedilol and spironolactone without any orthostasis.  She denies chest pain, dyspnea, palpitations, PND, orthopnea, dizziness, syncope, edema, or early satiety.  She was able to undergo at home  sleep testing, and this was normal.  Home Medications    Prior to Admission medications   Medication Sig Start Date End Date Taking? Authorizing Provider  Ascorbic Acid (VITAMIN C) 1000 MG tablet Take 1,000 mg by mouth daily.    [provider]  aspirin EC 81 MG tablet Take 81 mg by mouth daily.    [provider]  benazepril (LOTENSIN) 5 MG tablet TAKE 1 TABLET BY MOUTH EVERY DAY 09/05/20   Deboraha Sprang, MD  carvedilol (COREG) 3.125 MG tablet Take 1 tablet (3.125 mg total) by mouth 2 (two) times daily with a meal. 09/05/20   Deboraha Sprang, MD  clonazePAM (KLONOPIN) 0.5 MG tablet TAKE 1 TABLET BY MOUTH ONCE DAILY AS NEEDED FOR ANXIETY/ SLEEP 09/07/19   Volney American, PA-C  digoxin (LANOXIN) 0.125 MG tablet Take 1 tablet (  125 mcg total) by mouth daily. 09/15/19   Volney American, PA-C  Ergocalciferol (VITAMIN D2 PO) Take 1 tablet by mouth daily.    [provider]  levothyroxine (SYNTHROID) 112 MCG tablet Take 1 tablet by mouth once daily 04/28/20   Cannady, Henrine Screws T, NP  lovastatin (MEVACOR) 20 MG tablet TAKE 1 TABLET BY MOUTH AT BEDTIME 03/29/20   Johnson, Megan P, DO  spironolactone (ALDACTONE) 25 MG tablet Take 0.5 tablets (12.5 mg total) by mouth daily. 08/30/20 11/28/20  Theora Gianotti, NP  zinc gluconate 50 MG tablet Take 50 mg by mouth daily.    [provider]    Review of Systems    She denies chest pain, palpitations, dyspnea, pnd, orthopnea, n, v, dizziness, syncope, edema, weight gain, or early satiety.  All other systems reviewed and are otherwise negative except as noted above.  Physical Exam    VS:  BP 120/66 (BP Location: Left Arm, Patient Position: Sitting, Cuff Size: Large)   Pulse 78   Ht 5\' 5"  (1.651 m)   Wt 188 lb (85.3 kg)   LMP  (LMP Unknown)   SpO2 98%   BMI 31.28 kg/m  , BMI Body mass index is 31.28 kg/m. GEN: Well nourished, well developed, in no acute distress. HEENT: normal. Neck: Supple, no  JVD, carotid bruits, or masses. Cardiac: RRR, 1/6 systolic murmur at the upper sternal borders, no rubs, or gallops. No clubbing, cyanosis, edema.  Radials/PT 2+ and equal bilaterally.  Respiratory:  Respirations regular and unlabored, clear to auscultation bilaterally. GI: Soft, nontender, nondistended, BS + x 4. MS: no deformity or atrophy. Skin: warm and dry, no rash. Neuro:  Strength and sensation are intact. Psych: Normal affect.  Accessory Clinical Findings     Lab Results  Component Value Date   WBC 5.0 03/16/2019   HGB 15.1 03/16/2019   HCT 45.0 03/16/2019   MCV 94 03/16/2019   PLT 184 03/16/2019   Lab Results  Component Value Date   CREATININE 0.83 08/23/2020   BUN 17 08/23/2020   NA 134 (L) 08/23/2020   K 4.3 08/23/2020   CL 98 08/23/2020   CO2 28 08/23/2020   Lab Results  Component Value Date   ALT 16 09/15/2019   AST 22 09/15/2019   ALKPHOS 101 09/15/2019   BILITOT 0.3 09/15/2019   Lab Results  Component Value Date   CHOL 161 09/15/2019   HDL 51 09/15/2019   LDLCALC 93 09/15/2019   TRIG 91 09/15/2019     Assessment & Plan    1.  Chronic heart failure with reduced ejection fraction/nonischemic cardiomyopathy: Patient with a history of LV dysfunction dating back to at least July 2015, when EF was 35 to 40%.  This had been stable however on recent echo in March of this year, EF down to 25-30% with global hypokinesis, grade 2 diastolic dysfunction, and RVSP of 50 mmHg.  Moderate pericardial effusion also noted.  She has since tolerated additions of carvedilol and subsequently spironolactone.  She is feeling well since her last visit 1 month ago.  Blood pressure stable today at 120/66.  She prefers to remain on ACE inhibitor therapy rather than transitioning to [place] secondary to cost.  Secondary to cost, will also forego SGLT2 inhibitor at this time.  I will follow-up a basic metabolic panel today.  We will also arrange for an echocardiogram in about 1 month  to coincide with her follow-up with Dr. Caryl Comes, in order to reevaluate pericardial  effusion and LV function, and determine next course of action, potentially to include reattempt at CRT P versus D, if appropriate.  2.  Essential hypertension: Stable on beta-blocker, ACE inhibitor, and spironolactone therapy.  Fortunately, she has not been experiencing any orthostasis.  3.  Moderate pericardial effusion: Noted on echo earlier this year.  No tamponade physiology.  No rub on exam.  Asymptomatic.  Limited echo next month.  4.  Pulmonary hypertension: RVSP of 50 mmHg on echo earlier this year.  Euvolemic on examination.  At home sleep study recently normal.  5.  Hyperlipidemia: Continue statin therapy.  6.  Disposition: Follow-up basic metabolic panel today.  Follow-up echocardiogram in approximately 1 month to coincide with follow-up with Dr. Caryl Comes.   Murray Hodgkins, NP 09/18/2020, 8:08 AM

## 2020-09-18 ENCOUNTER — Other Ambulatory Visit: Payer: Self-pay

## 2020-09-18 ENCOUNTER — Encounter: Payer: Self-pay | Admitting: Nurse Practitioner

## 2020-09-18 ENCOUNTER — Ambulatory Visit: Payer: BC Managed Care – PPO | Admitting: Nurse Practitioner

## 2020-09-18 VITALS — BP 120/66 | HR 78 | Ht 65.0 in | Wt 188.0 lb

## 2020-09-18 DIAGNOSIS — I10 Essential (primary) hypertension: Secondary | ICD-10-CM

## 2020-09-18 DIAGNOSIS — I428 Other cardiomyopathies: Secondary | ICD-10-CM | POA: Diagnosis not present

## 2020-09-18 DIAGNOSIS — Z79899 Other long term (current) drug therapy: Secondary | ICD-10-CM

## 2020-09-18 DIAGNOSIS — I5020 Unspecified systolic (congestive) heart failure: Secondary | ICD-10-CM

## 2020-09-18 DIAGNOSIS — E782 Mixed hyperlipidemia: Secondary | ICD-10-CM

## 2020-09-18 NOTE — Patient Instructions (Signed)
Medication Instructions:  No changes at this time.  *If you need a refill on your cardiac medications before your next appointment, please call your pharmacy*   Lab Work: BMET today  If you have labs (blood work) drawn today and your tests are completely normal, you will receive your results only by: Marland Kitchen MyChart Message (if you have MyChart) OR . A paper copy in the mail If you have any lab test that is abnormal or we need to change your treatment, we will call you to review the results.   Testing/Procedures: Your physician has requested that you have an echocardiogram.   Echocardiography is a painless test that uses sound waves to create images of your heart. It provides your doctor with information about the size and shape of your heart and how well your heart's chambers and valves are working. This procedure takes approximately one hour. There are no restrictions for this procedure.     Follow-Up: At [facility], you and your health needs are our priority.  As part of our continuing mission to provide you with exceptional heart care, we have created designated Provider Care Teams.  These Care Teams include your primary Cardiologist (physician) and Advanced Practice Providers (APPs -  Physician Assistants and Nurse Practitioners) who all work together to provide you with the care you need, when you need it.   Your next appointment:   Keep upcoming appointment on 10/16/20 at 09:20 AM  The format for your next appointment:   In Person  Provider:   Virl Axe, MD

## 2020-09-19 ENCOUNTER — Telehealth: Payer: Self-pay

## 2020-09-19 LAB — BASIC METABOLIC PANEL
BUN/Creatinine Ratio: 13 (ref 12–28)
BUN: 10 mg/dL (ref 8–27)
CO2: 25 mmol/L (ref 20–29)
Calcium: 10 mg/dL (ref 8.7–10.3)
Chloride: 98 mmol/L (ref 96–106)
Creatinine, Ser: 0.78 mg/dL (ref 0.57–1.00)
Glucose: 106 mg/dL — ABNORMAL HIGH (ref 65–99)
Potassium: 5.2 mmol/L (ref 3.5–5.2)
Sodium: 137 mmol/L (ref 134–144)
eGFR: 86 mL/min/{1.73_m2} (ref >59)

## 2020-09-19 NOTE — Telephone Encounter (Signed)
The patient has been notified of the result and verbalized understanding.  All questions (if any) were answered. Antonieta Iba, RN 09/19/2020 3:13 PM  Patient is not sure if she would like to have an in lab PSG study done. She states that she wants to check with her insurance company first. She will call back if she decides to follow through.

## 2020-09-19 NOTE — Telephone Encounter (Signed)
-----   Message from Sueanne Margarita, MD sent at 09/13/2020 12:56 PM EDT ----- Please let patient know that sleep study showed no significant sleep apnea.  Please order an in lab PSG per sleep guidelines

## 2020-09-20 NOTE — Telephone Encounter (Signed)
This encounter is still open, please advise

## 2020-09-21 MED ORDER — BENAZEPRIL HCL 5 MG PO TABS: 5.0000 mg | ORAL_TABLET | Freq: Every day | ORAL | 0 refills | Status: DC

## 2020-09-21 NOTE — Telephone Encounter (Signed)
Spoke with Butch Penny in billing to review recent labs and bill. She states that this bill amount was billed to patients deductible. [facility] was for $161.00 then with insurance $117.98 was applied to patients deductible. Will reach out to patient to update her on findings.

## 2020-09-21 NOTE — Telephone Encounter (Signed)
Called supervisor Abigail Butts in lab at [facility] to inquire about coding and recent bill for patient. She directed me to billing person and will reach out to them.

## 2020-09-21 NOTE — Telephone Encounter (Signed)
Spoke with patient and updated her on findings from billing. She verbalized understanding with no further needs or questions at this time.

## 2020-10-16 ENCOUNTER — Ambulatory Visit (INDEPENDENT_AMBULATORY_CARE_PROVIDER_SITE_OTHER): Payer: BC Managed Care – PPO

## 2020-10-16 ENCOUNTER — Ambulatory Visit: Payer: BC Managed Care – PPO | Admitting: Internal Medicine

## 2020-10-16 ENCOUNTER — Other Ambulatory Visit: Payer: Self-pay

## 2020-10-16 ENCOUNTER — Encounter: Payer: Self-pay | Admitting: Internal Medicine

## 2020-10-16 VITALS — BP 118/66 | HR 83 | Ht 65.0 in | Wt 190.8 lb

## 2020-10-16 DIAGNOSIS — I5020 Unspecified systolic (congestive) heart failure: Secondary | ICD-10-CM

## 2020-10-16 DIAGNOSIS — I428 Other cardiomyopathies: Secondary | ICD-10-CM

## 2020-10-16 DIAGNOSIS — I2721 Secondary pulmonary arterial hypertension: Secondary | ICD-10-CM

## 2020-10-16 DIAGNOSIS — I10 Essential (primary) hypertension: Secondary | ICD-10-CM

## 2020-10-16 LAB — ECHOCARDIOGRAM COMPLETE
AR max vel: 2.89 cm2
AV Area VTI: 2.93 cm2
AV Area mean vel: 2.78 cm2
AV Mean grad: 6 mmHg
AV Peak grad: 14 mmHg
Ao pk vel: 1.87 m/s
Calc EF: 25.4 %
Height: 65 in
S' Lateral: 5.7 cm
Single Plane A2C EF: 27.5 %
Single Plane A4C EF: 28.9 %
Weight: 3052.8 oz

## 2020-10-16 NOTE — Progress Notes (Signed)
Patient ID: [name] [name], female   DOB: [birth year], 65 y.o.   MRN: [id number]        Patient Care Team: Gladstone Lighter, MD as PCP - General (Internal Medicine) Deboraha Sprang, MD as PCP - Cardiology (Cardiology)   HPI  [name] [name] is a 65 y.o. 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  Aug 16  underwent extraction of the initial system 2/2 noise on both her atrial and ventricular leads and new device implantation contralaterally.  There was concern for radiation injury given prolonged exposure and hence LV lead placement was not  further pursued after long difficulty in cannulating the concern sinus (GT)  Interval introduction of Guideline directed medical therapy given LV dysfunction with low dose carvedilol and aldactone without orthostasis; echo had also showed moderate effusion without tamponade.>> repeat echo pending this afternoon     Today, the patient denies chest pain, nocturnal dyspnea, orthopnea or peripheral edema. There have been no palpitations and no syncope. Complains of dyspnea with mask onbut otherwise not  Lightheadedness occurs while standing it is relieved by sitting, unassociated with sense of the world spinning, with out nausea and vomiting. Not associated with palpitations. She claims her stress level has increased due to her recently moving   She notes at night she has frequent urination   DATE TEST EF%   2010   Echo 45%   7/15 Echo   40-45%   7/16 Myoview   40% No ischemia  10/16  Cath  30-35% CA patent  3/22 Echo 25-30%   6/22 Ordered     Date Cr K Dig Hgb  6/21 0.74 4.3  (12/20) 15.1  6/22  0.78 5.2 0.9 (3/22) (1/22)15.2    Past Medical History:  Diagnosis Date   Anxiety    takes Klonopin daily as needed   Arthritis    Back pain    reason unknown   Chronic systolic CHF (congestive heart failure) ([place])    a. echo 10/2013: EF  40-45 (apical images 35-40%), diff HK, mild MR, PASP nl; b. echo 12/2014: EF 35-40%, no RWMA, GR1DD, mod TR, PASP nl; c. 06/2020 Echo: EF 25-30%, glob HK, Gr2 DD, GLS -6.1%, Nl RV fxn. RVSP ~ 40mmHg. Mildly dil LA. Mod circumferential pericardial effusion w/o tamponade. Mild-mod Ao sclerosis.   Complete AV block ([place])    a. s/p pacemaker insertion 1996; b. s/p contralateral side St. Jude generator change 11/2014.   Dizziness    occasionally   GERD (gastroesophageal reflux disease)    takes Zantac daily as needed   Gout    Headache    occasionally   History of blood clots    in heart-was on Coumadin---this many yrs ago   History of colon polyps    benign   Hyperlipidemia    takes Lovastatin daily   Hypertension    hx of-since hip replacement MD took resident off meds 10/30/14 b/c well controlled   Hypothyroid    takes Synthroid daily   Joint pain    Joint swelling    Moderate Pericardial effusion    a. 07/2020 Echo: Mod effusion w/o tamponade.   NICM (nonischemic cardiomyopathy) ([place])    a. tachycardia induced->EF 40-45% (2010), 35-40% (10/2013), 35-40% Gr1 DD, mod TR (12/2014);  b. 01/2015 Cath: Nl cors, EF 30-35%, glob HK; c. 06/2020 Echo: Ef 25-30%, glob HK, gr2 DD.   Numbness and  tingling    left leg and right arm   Ovarian cancer (HCC)    ovarian, skin    Pacemaker lead failure--noise on atrial greater than ventricular lead with ventricular backup pulse pacing 10/17/2014   Peripheral artery disease ([place])    stent on lt illiac    Past Surgical History:  Procedure Laterality Date   ABDOMINAL HYSTERECTOMY     ANGIOPLASTY / STENTING ILIAC     CARDIAC CATHETERIZATION N/A 02/08/2015   Procedure: Left Heart Cath and Coronary Angiography;  Surgeon: Wellington Hampshire, MD;  Location: [facility];  Service: Cardiovascular;  Laterality: N/A;   CERVICAL CERCLAGE  80yrs ago   COLONOSCOPY     DG BARIUM SWALLOW ([place] HX)     DILATION AND CURETTAGE OF UTERUS     EP IMPLANTABLE DEVICE  Left 12/06/2014   Procedure: Implantation of cardiac resynchronization therapy pacemaker;  Surgeon: Evans Lance, MD;  Location: [facility];  Service: Cardiovascular;  Laterality: Left;   HERNIA MESH REMOVAL Right    incisional   HERNIA REPAIR     ILIAC ARTERY STENT  Apr 05, 2010   Left artery stenting   INSERT / REPLACE / REMOVE PACEMAKER     JOINT REPLACEMENT  October 31, 2014   hip   KNEE SURGERY Right    PACEMAKER INSERTION  2012   Had replaced in July 2012, first placed in [place] Right 12/06/2014   Procedure: REMOVAL OF RV AND RA PACEMAKER LEADS;  Surgeon: Evans Lance, MD;  Location: [facility];  Service: Cardiovascular;  Laterality: Right;  DR. BARTLE TO BACK UP    PACEMAKER PLACEMENT Left 12/06/14   TOTAL ABDOMINAL HYSTERECTOMY W/ BILATERAL SALPINGOOPHORECTOMY  07/2008   Ovarian cancer   TOTAL HIP ARTHROPLASTY Right 10/31/2014   Procedure: TOTAL HIP ARTHROPLASTY ANTERIOR APPROACH;  Surgeon: Hessie Knows, MD;  Location: [facility];  Service: Orthopedics;  Laterality: Right;    Current Outpatient Medications  Medication Sig Dispense Refill   Ascorbic Acid (VITAMIN C) 1000 MG tablet Take 1,000 mg by mouth daily.     aspirin EC 81 MG tablet Take 81 mg by mouth daily.     benazepril (LOTENSIN) 5 MG tablet Take 1 tablet (5 mg total) by mouth daily. 90 tablet 0   benazepril (LOTENSIN) 5 MG tablet TAKE 1 TABLET BY MOUTH EVERY DAY 90 tablet 1   carvedilol (COREG) 3.125 MG tablet Take 1 tablet (3.125 mg total) by mouth 2 (two) times daily with a meal. 180 tablet 0   clonazePAM (KLONOPIN) 0.5 MG tablet TAKE 1 TABLET BY MOUTH ONCE DAILY AS NEEDED FOR ANXIETY/ SLEEP 30 tablet 0   digoxin (LANOXIN) 0.125 MG tablet Take 1 tablet (125 mcg total) by mouth daily. 90 tablet 1   Ergocalciferol (VITAMIN D2 PO) Take 1 tablet by mouth daily.     levothyroxine (SYNTHROID) 112 MCG tablet Take 1 tablet by mouth once daily 90 tablet 1   lovastatin (MEVACOR) 20 MG tablet TAKE 1 TABLET BY MOUTH AT  BEDTIME 90 tablet 0   spironolactone (ALDACTONE) 25 MG tablet Take 0.5 tablets (12.5 mg total) by mouth daily. 1 tablet 0   zinc gluconate 50 MG tablet Take 50 mg by mouth daily.     No current facility-administered medications for this visit.    Allergies  Allergen Reactions   Marshmallow [Althaea Officinalis]     Rash, hives, itching, SOB   Meat Extract     Rash,  hives itching, SOB.  Can tolerate some meats ONLY in moderation   Other     Jello - Rash, hives, itching, SOB   Contrast Media [Iodinated Diagnostic Agents]     unknown   Latex     rash   Nickel     rash   Physical Exam: BP 118/66   Pulse 83   Ht 5\' 5"  (1.651 m)   Wt 190 lb 12.8 oz (86.5 kg)   LMP  (LMP Unknown)   SpO2 95%   BMI 31.75 kg/m  Well developed and well nourished in no acute distress HENT normal Neck supple with JVP-flat Lungs Clear Device pocket well healed; without hematoma or erythema.  There is no tethering  Regular rate and rhythm, no  gallop No  murmur Abd-soft with active BS No Clubbing cyanosis No edema Skin-warm and dry A & Oriented  Grossly normal sensory and motor function  ECG: Sinus with P synchronous pacing at 83    Assessment and  Plan  Complete heart block-congenital  Pacemaker-St. Jude  Nonsichemic Cardiomyopathy    Congestive heart failure-chronic systolic class 2  Orthostatic LH   Peripheral vascular disease status post iliac stenting  Pacemaker lead revision dual chamber CRT-P can with failure to cannulate the coronary sinus  Patient is euvolemic.  We will continue her Aldactone 12.5.    With her cardiomyopathy, we will continue this as well as her benazepril 5 and carvedilol 3.125.    Orthostatic lightheadedness and relatively low blood pressure precludes further up titration at this time; however, this remains our goal; we will switch her benazepril from every morning to every afternoon to try to mitigate daytime lightheadedness.  Given the paucity of her  symptoms, however, we have also discussed the role of CRT upgrade.  We are agreed that we will hold off for now given her functional status.  Last digoxin level was borderline high at 0.9.  We will ask you to be rechecked when she returns in 4 months.    I,Stephanie Williams,acting as a Education administrator for Virl Axe, MD.,have documented all relevant documentation on the behalf of Virl Axe, MD,as directed by  Virl Axe, MD while in the presence of Virl Axe, MD.  I, Virl Axe, MD, have reviewed all documentation for this visit. The documentation on 10/16/20 for the exam, diagnosis, procedures, and orders are all accurate and complete.

## 2020-10-16 NOTE — Patient Instructions (Signed)
Medication Instructions:  Your physician has recommended you make the following change in your medication:   1) CHANGE benazepril to taking this at bedtime (the dose will stay the same)  *If you need a refill on your cardiac medications before your next appointment, please call your pharmacy*   Lab Work: - Your physician recommends that you have lab work today: BMP  If you have labs (blood work) drawn today and your tests are completely normal, you will receive your results only by: [place] (if you have MyChart) OR A paper copy in the mail If you have any lab test that is abnormal or we need to change your treatment, we will call you to review the results.   Testing/Procedures: - none ordered   Follow-Up: At [facility], you and your health needs are our priority.  As part of our continuing mission to provide you with exceptional heart care, we have created designated Provider Care Teams.  These Care Teams include your primary Cardiologist (physician) and Advanced Practice Providers (APPs -  Physician Assistants and Nurse Practitioners) who all work together to provide you with the care you need, when you need it.  We recommend signing up for the patient portal called "MyChart".  Sign up information is provided on this After Visit Summary.  MyChart is used to connect with patients for Virtual Visits (Telemedicine).  Patients are able to view lab/test results, encounter notes, upcoming appointments, etc.  Non-urgent messages can be sent to your provider as well.   To learn more about what you can do with MyChart, go to [email or web address].    Your next appointment:   1) 4 months with Ignacia Bayley, NP  2) 1 year with Dr. Caryl Comes  The format for your next appointment:   In Person  Provider:   As above   Other Instructions N/

## 2020-10-17 LAB — BASIC METABOLIC PANEL
BUN/Creatinine Ratio: 15 (ref 12–28)
BUN: 11 mg/dL (ref 8–27)
CO2: 24 mmol/L (ref 20–29)
Calcium: 10 mg/dL (ref 8.7–10.3)
Chloride: 98 mmol/L (ref 96–106)
Creatinine, Ser: 0.74 mg/dL (ref 0.57–1.00)
Glucose: 121 mg/dL — ABNORMAL HIGH (ref 65–99)
Potassium: 4.7 mmol/L (ref 3.5–5.2)
Sodium: 137 mmol/L (ref 134–144)
eGFR: 91 mL/min/{1.73_m2} (ref >59)

## 2020-10-30 ENCOUNTER — Ambulatory Visit (INDEPENDENT_AMBULATORY_CARE_PROVIDER_SITE_OTHER): Payer: BC Managed Care – PPO

## 2020-10-30 DIAGNOSIS — I428 Other cardiomyopathies: Secondary | ICD-10-CM | POA: Diagnosis not present

## 2020-10-30 LAB — CUP PACEART REMOTE DEVICE CHECK
Battery Remaining Longevity: 43 mo
Battery Remaining Percentage: 39 %
Battery Voltage: 2.95 V
Brady Statistic AP VP Percent: 18 %
Brady Statistic AP VS Percent: 1 %
Brady Statistic AS VP Percent: 82 %
Brady Statistic AS VS Percent: 1 %
Brady Statistic RA Percent Paced: 17 %
Brady Statistic RV Percent Paced: 99 %
Date Time Interrogation Session: 20220718084812
Implantable Lead Implant Date: 20160824
Implantable Lead Implant Date: 20160824
Implantable Lead Location: 753859
Implantable Lead Location: 753860
Implantable Pulse Generator Implant Date: 20160824
Lead Channel Impedance Value: 380 Ohm
Lead Channel Impedance Value: 460 Ohm
Lead Channel Pacing Threshold Amplitude: 0.5 V
Lead Channel Pacing Threshold Amplitude: 0.625 V
Lead Channel Pacing Threshold Pulse Width: 0.5 ms
Lead Channel Pacing Threshold Pulse Width: 0.5 ms
Lead Channel Sensing Intrinsic Amplitude: 1.7 mV
Lead Channel Sensing Intrinsic Amplitude: 10.6 mV
Lead Channel Setting Pacing Amplitude: 1.5 V
Lead Channel Setting Pacing Amplitude: 2 V
Lead Channel Setting Pacing Pulse Width: 0.5 ms
Lead Channel Setting Sensing Sensitivity: 2.5 mV
Pulse Gen Model: 3222
Pulse Gen Serial Number: 7802897

## 2020-11-16 ENCOUNTER — Telehealth: Payer: Self-pay | Admitting: Internal Medicine

## 2020-11-16 NOTE — Telephone Encounter (Signed)
Spoke with patient reassured her that her monitor is automatic and that's why we got the reading without her having to press the button. Patient verbalized understanding

## 2020-11-16 NOTE — Telephone Encounter (Signed)
Patient states she missed her 7/19 transmission. She states according to my chart is states there was a transmission performed. Please call to discuss.

## 2020-11-20 ENCOUNTER — Other Ambulatory Visit: Payer: Self-pay | Admitting: Internal Medicine

## 2020-11-22 NOTE — Progress Notes (Signed)
Remote pacemaker transmission.   

## 2020-11-28 ENCOUNTER — Telehealth: Payer: Self-pay | Admitting: Internal Medicine

## 2020-11-28 NOTE — Telephone Encounter (Signed)
Spoke with patient.  Made her aware that we can not refill her clonazePAM (KLONOPIN) 0.5 MG tablet.  Made her aware to contact her PCP for refills

## 2020-11-28 NOTE — Telephone Encounter (Signed)
*  STAT* If patient is at the pharmacy, call can be transferred to refill team.   1. Which medications need to be refilled? (please list name of each medication and dose if known) Clonazepam 0.5 mg po q d PRN  2. Which pharmacy/location (including street and city if local pharmacy) is medication to be sent to? [facility] [address]   3. Do they need a 30 day or 90 day supply? [place]

## 2021-01-23 ENCOUNTER — Other Ambulatory Visit (HOSPITAL_COMMUNITY): Payer: Self-pay

## 2021-01-23 MED ORDER — INFLUENZA VAC SPLIT QUAD 0.5 ML IM SUSY
PREFILLED_SYRINGE | INTRAMUSCULAR | 0 refills | Status: DC
  Filled 2021-01-23: qty 0.5, 1d supply, fill #0

## 2021-01-29 ENCOUNTER — Ambulatory Visit (INDEPENDENT_AMBULATORY_CARE_PROVIDER_SITE_OTHER): Payer: BC Managed Care – PPO

## 2021-01-29 DIAGNOSIS — I428 Other cardiomyopathies: Secondary | ICD-10-CM | POA: Diagnosis not present

## 2021-01-29 LAB — CUP PACEART REMOTE DEVICE CHECK
Battery Remaining Longevity: 41 mo
Battery Remaining Percentage: 36 %
Battery Voltage: 2.95 V
Brady Statistic AP VP Percent: 17 %
Brady Statistic AP VS Percent: 1 %
Brady Statistic AS VP Percent: 83 %
Brady Statistic AS VS Percent: 1 %
Brady Statistic RA Percent Paced: 17 %
Brady Statistic RV Percent Paced: 99 %
Date Time Interrogation Session: 20221018072722
Implantable Lead Implant Date: 20160824
Implantable Lead Implant Date: 20160824
Implantable Lead Location: 753859
Implantable Lead Location: 753860
Implantable Pulse Generator Implant Date: 20160824
Lead Channel Impedance Value: 380 Ohm
Lead Channel Impedance Value: 450 Ohm
Lead Channel Pacing Threshold Amplitude: 0.5 V
Lead Channel Pacing Threshold Amplitude: 0.75 V
Lead Channel Pacing Threshold Pulse Width: 0.5 ms
Lead Channel Pacing Threshold Pulse Width: 0.5 ms
Lead Channel Sensing Intrinsic Amplitude: 10.6 mV
Lead Channel Sensing Intrinsic Amplitude: 4.8 mV
Lead Channel Setting Pacing Amplitude: 1.5 V
Lead Channel Setting Pacing Amplitude: 2 V
Lead Channel Setting Pacing Pulse Width: 0.5 ms
Lead Channel Setting Sensing Sensitivity: 2.5 mV
Pulse Gen Model: 3222
Pulse Gen Serial Number: 7802897

## 2021-02-04 DIAGNOSIS — D225 Melanocytic nevi of trunk: Secondary | ICD-10-CM | POA: Diagnosis not present

## 2021-02-04 DIAGNOSIS — D2272 Melanocytic nevi of left lower limb, including hip: Secondary | ICD-10-CM | POA: Diagnosis not present

## 2021-02-04 DIAGNOSIS — D2262 Melanocytic nevi of left upper limb, including shoulder: Secondary | ICD-10-CM | POA: Diagnosis not present

## 2021-02-04 DIAGNOSIS — D2261 Melanocytic nevi of right upper limb, including shoulder: Secondary | ICD-10-CM | POA: Diagnosis not present

## 2021-02-07 NOTE — Progress Notes (Signed)
Remote pacemaker transmission.   

## 2021-02-13 DIAGNOSIS — Z8543 Personal history of malignant neoplasm of ovary: Secondary | ICD-10-CM | POA: Diagnosis not present

## 2021-02-13 DIAGNOSIS — I5022 Chronic systolic (congestive) heart failure: Secondary | ICD-10-CM | POA: Diagnosis not present

## 2021-02-13 DIAGNOSIS — I10 Essential (primary) hypertension: Secondary | ICD-10-CM | POA: Diagnosis not present

## 2021-02-13 DIAGNOSIS — E039 Hypothyroidism, unspecified: Secondary | ICD-10-CM | POA: Diagnosis not present

## 2021-02-15 ENCOUNTER — Other Ambulatory Visit: Payer: Self-pay | Admitting: Internal Medicine

## 2021-02-15 DIAGNOSIS — Z1231 Encounter for screening mammogram for malignant neoplasm of breast: Secondary | ICD-10-CM

## 2021-02-24 ENCOUNTER — Other Ambulatory Visit: Payer: Self-pay | Admitting: Internal Medicine

## 2021-02-28 ENCOUNTER — Ambulatory Visit: Payer: BC Managed Care – PPO | Admitting: Nurse Practitioner

## 2021-02-28 ENCOUNTER — Encounter: Payer: Self-pay | Admitting: Nurse Practitioner

## 2021-02-28 ENCOUNTER — Other Ambulatory Visit: Payer: Self-pay

## 2021-02-28 VITALS — BP 120/80 | HR 93 | Ht 63.0 in | Wt 200.0 lb

## 2021-02-28 DIAGNOSIS — I5020 Unspecified systolic (congestive) heart failure: Secondary | ICD-10-CM

## 2021-02-28 DIAGNOSIS — I10 Essential (primary) hypertension: Secondary | ICD-10-CM

## 2021-02-28 DIAGNOSIS — I428 Other cardiomyopathies: Secondary | ICD-10-CM | POA: Diagnosis not present

## 2021-02-28 DIAGNOSIS — E782 Mixed hyperlipidemia: Secondary | ICD-10-CM

## 2021-02-28 DIAGNOSIS — I2721 Secondary pulmonary arterial hypertension: Secondary | ICD-10-CM

## 2021-02-28 DIAGNOSIS — I5022 Chronic systolic (congestive) heart failure: Secondary | ICD-10-CM | POA: Diagnosis not present

## 2021-02-28 NOTE — Patient Instructions (Signed)
Medication Instructions:  No changes at this time.  *If you need a refill on your cardiac medications before your next appointment, please call your pharmacy*   Lab Work: Dig level today  If you have labs (blood work) drawn today and your tests are completely normal, you will receive your results only by: [place] (if you have MyChart) OR A paper copy in the mail If you have any lab test that is abnormal or we need to change your treatment, we will call you to review the results.   Testing/Procedures: None   Follow-Up: At [facility], you and your health needs are our priority.  As part of our continuing mission to provide you with exceptional heart care, we have created designated Provider Care Teams.  These Care Teams include your primary Cardiologist (physician) and Advanced Practice Providers (APPs -  Physician Assistants and Nurse Practitioners) who all work together to provide you with the care you need, when you need it.   Your next appointment:   3-4 month(s)  The format for your next appointment:   In Person  Provider:   Virl Axe, MD

## 2021-02-28 NOTE — Progress Notes (Signed)
Office Visit    Patient Name: [name] [name] Date of Encounter: 02/28/2021  Primary Care Provider:  Gladstone Lighter, MD Primary Cardiologist:  [name] Axe, MD  Chief Complaint    65 year old female with a history of congenital complete heart block status post permanent pacemaker implantation (initially in the 1990s), chronic combined systolic diastolic ingestive heart failure, nonischemic cardiomyopathy, hypertension, hyperlipidemia, hypothyroidism, anxiety, arthritis, GERD, ovarian cancer, gout, and pericardial effusion, presents for follow-[name] of heart failure.  Past Medical History    Past Medical History:  Diagnosis Date   Anxiety    takes Klonopin daily as needed   Arthritis    Back pain    reason unknown   Chronic systolic CHF (congestive heart failure) ([place])    a. Echo 10/2013: EF 40-45 (apical images 35-40%); b. Echo 12/2014: EF 35-40%; c. 06/2020 Echo: EF 25-30%, glob HK, Gr2 DD, RVSP ~ 27mmHg; d. 10/2020 Echo: EF 25-30%, glob HK. Nl RV fxn, nl PASP, mildly dil LA. Small peric eff, triv MR.   Complete AV block ([place])    a. s/p pacemaker insertion 1996; b. s/p contralateral side St. Jude generator change 11/2014.   Dizziness    occasionally   GERD (gastroesophageal reflux disease)    takes Zantac daily as needed   Gout    Headache    occasionally   History of blood clots    in heart-was on Coumadin---this many yrs ago   History of colon polyps    benign   Hyperlipidemia    takes Lovastatin daily   Hypertension    hx of-since hip replacement MD took resident off meds 10/30/14 b/c well controlled   Hypothyroid    takes Synthroid daily   Joint pain    Joint swelling    Moderate Pericardial effusion    a. 07/2020 Echo: Mod effusion w/o tamponade; b. 10/2020 Echo: small peric eff w/o tamponade.   NICM (nonischemic cardiomyopathy) ([place])    a. tachycardia induced->EF 40-45% (2010), 35-40% (10/2013), 35-40% Gr1 DD, mod TR (12/2014);  b. 01/2015 Cath: Nl cors, EF 30-35%,  glob HK; c. 06/2020 Echo: EF 25-30%; d. 10/2020 Echo: EF 25-30%.   Numbness and tingling    left leg and right arm   Ovarian cancer (HCC)    ovarian, skin    Pacemaker lead failure--noise on atrial greater than ventricular lead with ventricular backup pulse pacing 10/17/2014   Peripheral artery disease ([place])    stent on lt illiac   Past Surgical History:  Procedure Laterality Date   ABDOMINAL HYSTERECTOMY     ANGIOPLASTY / STENTING ILIAC     CARDIAC CATHETERIZATION N/A 02/08/2015   Procedure: Left Heart Cath and Coronary Angiography;  Surgeon: [name] Hampshire, MD;  Location: [facility] [facility];  Service: Cardiovascular;  Laterality: N/A;   CERVICAL CERCLAGE  21yrs ago   COLONOSCOPY     DG BARIUM SWALLOW ([place] HX)     DILATION AND CURETTAGE OF UTERUS     EP IMPLANTABLE DEVICE Left 12/06/2014   Procedure: Implantation of cardiac resynchronization therapy pacemaker;  Surgeon: [name] Lance, MD;  Location: [facility];  Service: Cardiovascular;  Laterality: Left;   HERNIA MESH REMOVAL Right    incisional   HERNIA REPAIR     ILIAC ARTERY STENT  Apr 05, 2010   Left artery stenting   INSERT / REPLACE / REMOVE PACEMAKER     JOINT REPLACEMENT  October 31, 2014   hip   KNEE SURGERY Right  PACEMAKER INSERTION  2012   Had replaced in July 2012, first placed in [place] Right 12/06/2014   Procedure: REMOVAL OF RV AND RA PACEMAKER LEADS;  Surgeon: [name] Lance, MD;  Location: [facility];  Service: Cardiovascular;  Laterality: Right;  [name] [name] TO BACK [name]    PACEMAKER PLACEMENT Left 12/06/14   TOTAL ABDOMINAL HYSTERECTOMY W/ BILATERAL SALPINGOOPHORECTOMY  07/2008   Ovarian cancer   TOTAL HIP ARTHROPLASTY Right 10/31/2014   Procedure: TOTAL HIP ARTHROPLASTY ANTERIOR APPROACH;  Surgeon: [name] Knows, MD;  Location: [facility];  Service: Orthopedics;  Laterality: Right;    Allergies  Allergies  Allergen Reactions   Chicken Allergy Other (See Comments)    Rash, hives itching, SOB.   Can tolerate some meats ONLY in moderation   Marshmallow [Althaea Officinalis]     Rash, hives, itching, SOB   Meat Extract     Rash, hives itching, SOB.  Can tolerate some meats ONLY in moderation   Other     Jello - Rash, hives, itching, SOB   Contrast Media [Iodinated Diagnostic Agents]     unknown   Latex     rash   Nickel     rash    History of Present Illness    65 year old female with the above complex past medical history including congenital complete heart block status post permanent pacemaker placement, initially in the 1990s with most recent change out in July 2016 (inability to place LV lead-CRT-P), chronic combined systolic and diastolic congestive heart failure, nonischemic cardiomyopathy, hypertension, hyperlipidemia, hypothyroidism, anxiety, arthritis, GERD, ovarian cancer, gout, and pericardial effusion.  She was initially diagnosed with tachycardia mediated cardiomyopathy in 2010 with an EF of 40 to 45% at that time.  EF is since remained depressed.  She underwent diagnostic catheterization October 2016, which showed normal coronary arteries and an EF of 30 to 35%.  Medical management of cardiomyopathy has been complicated over the years in the setting of hypotension prompting discontinuation of beta-blockers in the past.  Echo in March 2022 showed further reduction in LV function at 25-30%.  RVSP was measured at 50 mmHg.  Low-dose carvedilol 3.125 mg twice daily was added at that time.  Follow-[name] echocardiogram in July 2022 showed persistent LV dysfunction with an EF of 25 to 30% with only a small pericardial effusion without evidence of tamponade.  [name] was last seen in cardiology clinic on July 5 at which time she was having some orthostatic lightheadedness.  She was maintained on carvedilol 3.125 mg twice daily, benazepril 5 mg every afternoon, and spironolactone 12.5 mg daily.  Notes indicate that she was stable at most recent primary care visit on November 2.  From  a heart failure standpoint, she says she is done well.  She denies chest pain or dyspnea.  Her weight is [name] 10 pounds over the past few months and says that she has been very inactive and eating lots of ice cream.  She denies PND, orthopnea, dizziness, syncope, edema, or early satiety.  Home Medications    Current Outpatient Medications  Medication Sig Dispense Refill   Ascorbic Acid (VITAMIN C) 1000 MG tablet Take 1,000 mg by mouth daily.     aspirin EC 81 MG tablet Take 81 mg by mouth daily.     benazepril (LOTENSIN) 5 MG tablet TAKE 1 TABLET BY MOUTH EVERY DAY 90 tablet 1   carvedilol (COREG) 3.125 MG tablet TAKE 1 TABLET (3.125 MG TOTAL) BY MOUTH 2 (  TWO) TIMES DAILY WITH A MEAL. 180 tablet 1   clonazePAM (KLONOPIN) 0.5 MG tablet TAKE 1 TABLET BY MOUTH ONCE DAILY AS NEEDED FOR ANXIETY/ SLEEP 30 tablet 0   digoxin (LANOXIN) 0.125 MG tablet Take 1 tablet (125 mcg total) by mouth daily. 90 tablet 1   Ergocalciferol (VITAMIN D2 PO) Take 1 tablet by mouth daily.     influenza vac split quadrivalent PF (FLUARIX) 0.5 ML injection Inject into the muscle. 0.5 mL 0   levothyroxine (SYNTHROID) 112 MCG tablet Take 1 tablet by mouth once daily 90 tablet 1   lovastatin (MEVACOR) 20 MG tablet TAKE 1 TABLET BY MOUTH AT BEDTIME 90 tablet 0   spironolactone (ALDACTONE) 25 MG tablet Take 0.5 tablets (12.5 mg total) by mouth daily. 1 tablet 0   zinc gluconate 50 MG tablet Take 50 mg by mouth daily.     No current facility-administered medications for this visit.     Review of Systems    Activity is limited-sedentary.  She denies chest pain, palpitations, dyspnea, pnd, orthopnea, n, v, dizziness, syncope, edema, weight gain, or early satiety.  All other systems reviewed and are otherwise negative except as noted above.    Physical Exam    VS:  BP 120/80 (BP Location: Left Arm, Patient Position: Sitting, Cuff Size: Normal)   Pulse 93   Ht 5\' 3"  (1.6 m)   Wt 200 lb (90.7 kg)   LMP  (LMP Unknown)   SpO2  98%   BMI 35.43 kg/m  , BMI Body mass index is 35.43 kg/m.     GEN: Obese, in no acute distress. HEENT: normal. Neck: Supple, no JVD, carotid bruits, or masses. Cardiac: RRR, 1/6 systolic murmur at upper sternal borders, no rubs or gallops. No clubbing, cyanosis, edema.  Radials/PT 2+ and equal bilaterally.  Respiratory:  Respirations regular and unlabored, clear to auscultation bilaterally. GI: Obese, soft, nontender, nondistended, BS + x 4. MS: no deformity or atrophy. Skin: warm and dry, no rash. Neuro:  Strength and sensation are intact. Psych: Normal affect.  Accessory Clinical Findings    ECG personally reviewed by me today -a sensed, V paced, 93- no acute changes.  Lab Results  Component Value Date   WBC 5.0 03/16/2019   HGB 15.1 03/16/2019   HCT 45.0 03/16/2019   MCV 94 03/16/2019   PLT 184 03/16/2019   Lab Results  Component Value Date   CREATININE 0.74 10/16/2020   BUN 11 10/16/2020   NA 137 10/16/2020   K 4.7 10/16/2020   CL 98 10/16/2020   CO2 24 10/16/2020   Lab Results  Component Value Date   ALT 16 09/15/2019   AST 22 09/15/2019   ALKPHOS 101 09/15/2019   BILITOT 0.3 09/15/2019   Lab Results  Component Value Date   CHOL 161 09/15/2019   HDL 51 09/15/2019   LDLCALC 93 09/15/2019   TRIG 91 09/15/2019    Assessment & Plan    1.  Chronic heart failure w/ reduced EF/NICM: History of LV dysfunction dating back to at least July 2015, when EF was 35 to 40%.  Most recent echo in March 2022-EF 25 to 30% with global hypokinesis, grade 2 diastolic dysfunction, and RVSP of 50 mmHg.  She had a moderate pericardial fusion at that time.  Follow-[name] echo in July showed only small effusion.  She has been doing well from a heart failure standpoint although notes that she has been very sedentary and weight is [name] 10 pounds  in the setting of inactivity and eating ice cream daily.  We discussed possibly transitioning her off of Lotensin and onto Entresto however, this  would be a tier for drug for her and cost $90 a month and she does not think that will be financially feasible.  The same will be true for an SGLT2 inhibitor.  Therefore, we will continue carvedilol, Lotensin, digoxin, and spironolactone.  She is due for a digoxin level today.  Renal function and potassium were stable in July.  2.  Essential hypertension: Stable on current regimen.  3.  Pulmonary hypertension: Euvolemic on examination.  Continue spironolactone.  4.  Hyperlipidemia: Continue statin therapy.  5.  Follow-[name] digoxin level today.  Follow-[name] in clinic in 3 to 4 months or sooner if necessary.  Murray Hodgkins, NP 02/28/2021, 5:09 PM

## 2021-03-01 LAB — DIGOXIN LEVEL: Digoxin, Serum: 0.8 ng/mL (ref 0.5–0.9)

## 2021-03-11 ENCOUNTER — Encounter: Payer: Self-pay | Admitting: Internal Medicine

## 2021-03-14 ENCOUNTER — Ambulatory Visit
Admission: RE | Admit: 2021-03-14 | Discharge: 2021-03-14 | Disposition: A | Discharge status: Home / Self Care | Payer: BC Managed Care – PPO | Source: Ambulatory Visit | Attending: Internal Medicine | Admitting: Internal Medicine

## 2021-03-14 ENCOUNTER — Other Ambulatory Visit: Payer: Self-pay

## 2021-03-14 DIAGNOSIS — Z1231 Encounter for screening mammogram for malignant neoplasm of breast: Secondary | ICD-10-CM | POA: Insufficient documentation

## 2021-03-15 NOTE — Telephone Encounter (Signed)
Patient assistant application completed for Entresto to see if she could get it at discounted rate or for free. Chart reviewed and they were considering the switch but due to cost they have not done that as of yet. Advised I would need to review with provider to see if we can complete the application and if she is approved then start medication and stop the other. She was appreciative for the assistance with no further questions at this time.

## 2021-03-22 ENCOUNTER — Other Ambulatory Visit: Payer: Self-pay | Admitting: *Deleted

## 2021-03-22 ENCOUNTER — Telehealth: Payer: Self-pay | Admitting: Nurse Practitioner

## 2021-03-22 MED ORDER — ENTRESTO 24-26 MG PO TABS: 1.0000 | ORAL_TABLET | Freq: Two times a day (BID) | ORAL | 3 refills | Status: DC

## 2021-03-22 NOTE — Telephone Encounter (Signed)
Patient assistance application started and pending income documents to submit to program. Placed under "Pending applications" file when she brings necessary income forms will then fax to company.

## 2021-03-25 NOTE — Telephone Encounter (Signed)
Patient states she will attempt to send documents thru her mychart if unable to submit she will bring them by office on friday

## 2021-03-25 NOTE — Telephone Encounter (Signed)
Left voicemail message that I received her message and will watch for her income documents to send in application.

## 2021-03-29 NOTE — Telephone Encounter (Signed)
Patient dropped off information  Placed in nurse box

## 2021-04-01 NOTE — Telephone Encounter (Signed)
Application completed, faxed, and placed in sample closet files.

## 2021-04-11 NOTE — Addendum Note (Signed)
Addended by: Valora Corporal on: 04/11/2021 10:06 AM   Modules accepted: Orders

## 2021-04-11 NOTE — Telephone Encounter (Signed)
Letter received from [employer] patient assistance foundation. Patient does not meet the requirements for enrollment due to household income exceeds program limits. Will file letter in samples closet with application that was submitted.

## 2021-04-30 ENCOUNTER — Ambulatory Visit (INDEPENDENT_AMBULATORY_CARE_PROVIDER_SITE_OTHER): Payer: BC Managed Care – PPO

## 2021-04-30 DIAGNOSIS — I442 Atrioventricular block, complete: Secondary | ICD-10-CM

## 2021-04-30 LAB — CUP PACEART REMOTE DEVICE CHECK
Battery Remaining Longevity: 37 mo
Battery Remaining Percentage: 34 %
Battery Voltage: 2.93 V
Brady Statistic AP VP Percent: 13 %
Brady Statistic AP VS Percent: 1 %
Brady Statistic AS VP Percent: 87 %
Brady Statistic AS VS Percent: 1 %
Brady Statistic RA Percent Paced: 13 %
Brady Statistic RV Percent Paced: 99 %
Date Time Interrogation Session: 20230117095931
Implantable Lead Implant Date: 20160824
Implantable Lead Implant Date: 20160824
Implantable Lead Location: 753859
Implantable Lead Location: 753860
Implantable Pulse Generator Implant Date: 20160824
Lead Channel Impedance Value: 380 Ohm
Lead Channel Impedance Value: 450 Ohm
Lead Channel Pacing Threshold Amplitude: 0.5 V
Lead Channel Pacing Threshold Amplitude: 0.75 V
Lead Channel Pacing Threshold Pulse Width: 0.5 ms
Lead Channel Pacing Threshold Pulse Width: 0.5 ms
Lead Channel Sensing Intrinsic Amplitude: 10.6 mV
Lead Channel Sensing Intrinsic Amplitude: 4.7 mV
Lead Channel Setting Pacing Amplitude: 1.5 V
Lead Channel Setting Pacing Amplitude: 2 V
Lead Channel Setting Pacing Pulse Width: 0.5 ms
Lead Channel Setting Sensing Sensitivity: 2.5 mV
Pulse Gen Model: 3222
Pulse Gen Serial Number: 7802897

## 2021-05-13 NOTE — Progress Notes (Signed)
Remote pacemaker transmission.   

## 2021-05-19 ENCOUNTER — Other Ambulatory Visit: Payer: Self-pay | Admitting: Nurse Practitioner

## 2021-06-04 ENCOUNTER — Ambulatory Visit (INDEPENDENT_AMBULATORY_CARE_PROVIDER_SITE_OTHER): Payer: BC Managed Care – PPO | Admitting: Internal Medicine

## 2021-06-04 ENCOUNTER — Encounter: Payer: Self-pay | Admitting: Internal Medicine

## 2021-06-04 ENCOUNTER — Other Ambulatory Visit: Payer: Self-pay

## 2021-06-04 VITALS — BP 116/70 | HR 84 | Ht 65.0 in | Wt 200.0 lb

## 2021-06-04 DIAGNOSIS — I442 Atrioventricular block, complete: Secondary | ICD-10-CM | POA: Diagnosis not present

## 2021-06-04 DIAGNOSIS — Z950 Presence of cardiac pacemaker: Secondary | ICD-10-CM

## 2021-06-04 DIAGNOSIS — I10 Essential (primary) hypertension: Secondary | ICD-10-CM

## 2021-06-04 DIAGNOSIS — I5020 Unspecified systolic (congestive) heart failure: Secondary | ICD-10-CM | POA: Diagnosis not present

## 2021-06-04 DIAGNOSIS — I428 Other cardiomyopathies: Secondary | ICD-10-CM

## 2021-06-04 NOTE — Progress Notes (Signed)
Patient ID: [name] [name], female   DOB: 11-17-1955, 66 y.o.   MRN: [id number]        Patient Care Team: Gladstone Lighter, MD as PCP - General (Internal Medicine) Deboraha Sprang, MD as PCP - Cardiology (Cardiology)   HPI  [name] is a 66 y.o. 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  Aug 16  underwent extraction of the initial system 2/2 noise on both her atrial and ventricular leads and new device implanted ipsilaterally  There was concern for radiation injury given prolonged exposure and hence LV lead placement was not  further pursued after long difficulty in cannulating the concern sinus (GT)  Interval introduction of Guideline directed medical therapy given LV dysfunction with low dose carvedilol and aldactone without orthostasis; echo had also showed moderate effusion which resolved without drainage Entresto and SGLT2's have been precluded because of cost  The patient denies chest pain, shortness of breath, nocturnal dyspnea, orthopnea or peripheral edema.  There have been no palpitations, lightheadedness or syncope.  Complains of fatigue and lassitude; lacking the desire to do her inability to drink.  She acknowledges depression and is tearful, she comments on the fact that her son is in the [employer] and is overseas that she does not see him  She is involved in church.   DATE TEST EF%   2010   Echo 45%   7/15 Echo   40-45%   7/16 Myoview   40% No ischemia  10/16  Cath  30-35% CA patent  3/22 Echo 25-30%   7/22 Echo  25-30%    Date Cr K Dig Hgb  6/21 0.74 4.3  (12/20) 15.1  6/22  0.78 5.2 0.9 (3/22) (1/22)15.2          Past Medical History:  Diagnosis Date   Anxiety    takes Klonopin daily as needed   Arthritis    Back pain    reason unknown   Chronic systolic CHF (congestive heart failure) ([place])    a. Echo 10/2013: EF 40-45 (apical images 35-40%); b.  Echo 12/2014: EF 35-40%; c. 06/2020 Echo: EF 25-30%, glob HK, Gr2 DD, RVSP ~ 54mmHg; d. 10/2020 Echo: EF 25-30%, glob HK. Nl RV fxn, nl PASP, mildly dil LA. Small peric eff, triv MR.   Complete AV block ([place])    a. s/p pacemaker insertion 1996; b. s/p contralateral side St. Jude generator change 11/2014.   Dizziness    occasionally   GERD (gastroesophageal reflux disease)    takes Zantac daily as needed   Gout    Headache    occasionally   History of blood clots    in heart-was on Coumadin---this many yrs ago   History of colon polyps    benign   Hyperlipidemia    takes Lovastatin daily   Hypertension    hx of-since hip replacement MD took resident off meds 10/30/14 b/c well controlled   Hypothyroid    takes Synthroid daily   Joint pain    Joint swelling    Moderate Pericardial effusion    a. 07/2020 Echo: Mod effusion w/o tamponade; b. 10/2020 Echo: small peric eff w/o tamponade.   NICM (nonischemic cardiomyopathy) ([place])    a. tachycardia induced->EF 40-45% (2010), 35-40% (10/2013), 35-40% Gr1 DD, mod TR (12/2014);  b. 01/2015 Cath: Nl cors, EF 30-35%, glob HK; c. 06/2020 Echo: EF 25-30%; d. 10/2020 Echo: EF  25-30%.   Numbness and tingling    left leg and right arm   Ovarian cancer (HCC)    ovarian, skin    Pacemaker lead failure--noise on atrial greater than ventricular lead with ventricular backup pulse pacing 10/17/2014   Peripheral artery disease ([place])    stent on lt illiac    Past Surgical History:  Procedure Laterality Date   ABDOMINAL HYSTERECTOMY     ANGIOPLASTY / STENTING ILIAC     CARDIAC CATHETERIZATION N/A 02/08/2015   Procedure: Left Heart Cath and Coronary Angiography;  Surgeon: Wellington Hampshire, MD;  Location: [facility];  Service: Cardiovascular;  Laterality: N/A;   CERVICAL CERCLAGE  28yrs ago   COLONOSCOPY     DG BARIUM SWALLOW ([place] HX)     DILATION AND CURETTAGE OF UTERUS     EP IMPLANTABLE DEVICE Left 12/06/2014   Procedure: Implantation of cardiac  resynchronization therapy pacemaker;  Surgeon: Evans Lance, MD;  Location: [facility];  Service: Cardiovascular;  Laterality: Left;   HERNIA MESH REMOVAL Right    incisional   HERNIA REPAIR     ILIAC ARTERY STENT  Apr 05, 2010   Left artery stenting   INSERT / REPLACE / REMOVE PACEMAKER     JOINT REPLACEMENT  October 31, 2014   hip   KNEE SURGERY Right    PACEMAKER INSERTION  2012   Had replaced in July 2012, first placed in [place] Right 12/06/2014   Procedure: REMOVAL OF RV AND RA PACEMAKER LEADS;  Surgeon: Evans Lance, MD;  Location: [facility];  Service: Cardiovascular;  Laterality: Right;  DR. BARTLE TO BACK UP    PACEMAKER PLACEMENT Left 12/06/14   TOTAL ABDOMINAL HYSTERECTOMY W/ BILATERAL SALPINGOOPHORECTOMY  07/2008   Ovarian cancer   TOTAL HIP ARTHROPLASTY Right 10/31/2014   Procedure: TOTAL HIP ARTHROPLASTY ANTERIOR APPROACH;  Surgeon: Hessie Knows, MD;  Location: [facility];  Service: Orthopedics;  Laterality: Right;    Current Outpatient Medications  Medication Sig Dispense Refill   aspirin EC 81 MG tablet Take 81 mg by mouth daily.     benazepril (LOTENSIN) 5 MG tablet TAKE 1 TABLET (5 MG TOTAL) BY MOUTH DAILY. 90 tablet 2   carvedilol (COREG) 3.125 MG tablet TAKE 1 TABLET (3.125 MG TOTAL) BY MOUTH 2 (TWO) TIMES DAILY WITH A MEAL. 180 tablet 1   clonazePAM (KLONOPIN) 0.5 MG tablet TAKE 1 TABLET BY MOUTH ONCE DAILY AS NEEDED FOR ANXIETY/ SLEEP 30 tablet 0   digoxin (LANOXIN) 0.125 MG tablet Take 1 tablet (125 mcg total) by mouth daily. 90 tablet 1   levothyroxine (SYNTHROID) 112 MCG tablet Take 1 tablet by mouth once daily 90 tablet 1   lovastatin (MEVACOR) 20 MG tablet TAKE 1 TABLET BY MOUTH AT BEDTIME 90 tablet 0   zinc gluconate 50 MG tablet Take 50 mg by mouth daily.     spironolactone (ALDACTONE) 25 MG tablet Take 0.5 tablets (12.5 mg total) by mouth daily. 1 tablet 0   No current facility-administered medications for this visit.    Allergies  Allergen  Reactions   Marshmallow [Althaea Officinalis]     Rash, hives, itching, SOB   Meat Extract     Rash, hives itching, SOB.  Can tolerate some meats ONLY in moderation   Other     Jello - Rash, hives, itching, SOB   Contrast Media [Iodinated Contrast Media]     unknown   Latex  rash   Nickel     rash   Physical Exam: BP 116/70 (BP Location: Left Arm, Patient Position: Sitting, Cuff Size: Large)    Pulse 84    Ht 5\' 5"  (1.651 m)    Wt 200 lb (90.7 kg)    LMP  (LMP Unknown)    SpO2 98%    BMI 33.28 kg/m  Well developed and well nourished in no acute distress HENT normal Neck supple with JVP-flat Clear Device pocket well healed; without hematoma or erythema.  There is no tethering  Regular rate and rhythm, no  gallop No murmur Abd-soft with active BS No Clubbing cyanosis  edema Skin-warm and dry A & Oriented  Grossly normal sensory and motor function  ECG sinus at 84 with P synchronous pacing Intervals 22/19/43    Assessment and  Plan  Complete heart block-congenital  Pacemaker-St. Jude  Nonsichemic Cardiomyopathy    Congestive heart failure-chronic systolic class 2  Orthostatic LH   Peripheral vascular disease status post iliac stenting  Pacemaker lead revision dual chamber CRT-P can with failure to cannulate the coronary sinus   Last potassium level was elevated; we will check a metabolic profile.  With her cardiomyopathy, she would be benefited by [facility]; last time she got she did not qualify for patient support, I have mentioned to her 2 things that might be of help, the first is the [facility] and the other is I gave her a co-pay card to to see if it would make it affordable.  Lightheadedness is better.  Lassitude may well be part of depression.  As I asked her about depression she became tearful.  She discussed the issue with her son being overseas.  While she acknowledges support from her church group, she is still quite  despondent.  I have asked her to follow-up with her PCP regarding the possibility of short-term antidepressant therapies and have encouraged her also to follow-up with her church group so they can encourage and support each other  For now, since it does not seem like exercise intolerance is limiting, we will hold off on pursuing CRT upgrade possibly using a left bundle branch area lead  32 min was spent in care of the patient including the review of records

## 2021-06-04 NOTE — Patient Instructions (Signed)

## 2021-06-05 ENCOUNTER — Telehealth: Payer: Self-pay | Admitting: Internal Medicine

## 2021-06-05 NOTE — Telephone Encounter (Addendum)
Spoke with the patient. Patient was seen by Dr. Caryl Comes yesterday 06/04/21 for an in office visit. Patients heartrate in the office 84 bpm and she has a PPM. Patients pharmacy has refilled digoxin for the correct prescribed dosage 0.125 mg tablet daily.  Patient has held digoxin today. Adv her that it would be ok to resume her prescribed dosage of 0.125 mg tomorrow. Adv the patient that I will fwd the update to Dr. Caryl Comes and she would receive a call back if he has additional recommendations. Patient is agreeable with the plan and voiced appreciation for the call.

## 2021-06-05 NOTE — Telephone Encounter (Signed)
Pt c/o medication issue:  1. Name of Medication: digoxin  2. How are you currently taking this medication (dosage and times per day)? Holding today but has been taking 0.250 mg po q d since last rx refilled by [facility]  3. Are you having a reaction (difficulty breathing--STAT)? no  4. What is your medication issue? Per pharmacy patient did not realize she was to 1/2 the tablet refilled for 0.250 strength and when refilling much too soon discovered she has been doubling recommended dose.   Per pharmacy they advised patient to hold dig and this office fu with patient directly.

## 2021-06-07 ENCOUNTER — Encounter: Payer: Self-pay | Admitting: Internal Medicine

## 2021-06-07 LAB — PACEMAKER DEVICE OBSERVATION

## 2021-06-17 NOTE — Telephone Encounter (Signed)
Reviewed with Dr. Caryl Comes last week- My chart message sent to the patient with MD recommendations. ?See 3/3 MyChart message in the patient's chart. ? ?

## 2021-06-28 ENCOUNTER — Other Ambulatory Visit: Payer: Self-pay | Admitting: *Deleted

## 2021-06-28 DIAGNOSIS — I5020 Unspecified systolic (congestive) heart failure: Secondary | ICD-10-CM

## 2021-06-28 DIAGNOSIS — Z79899 Other long term (current) drug therapy: Secondary | ICD-10-CM

## 2021-06-28 MED ORDER — SACUBITRIL-VALSARTAN 24-26 MG PO TABS: 1.0000 | ORAL_TABLET | Freq: Two times a day (BID) | ORAL | 6 refills | Status: DC

## 2021-06-28 NOTE — Progress Notes (Signed)
Entresto 24/26 mg BID- new start ?

## 2021-07-02 ENCOUNTER — Telehealth: Payer: Self-pay | Admitting: Internal Medicine

## 2021-07-02 NOTE — Telephone Encounter (Signed)
Patient will call back to schedule ?

## 2021-07-02 NOTE — Telephone Encounter (Signed)
-----   Message from Emily Filbert, RN sent at 06/28/2021  2:47 PM EDT ----- ?Do you mind please calling the patient to schedule a lab appointment in about 2-2.5 weeks- BMP ? ?Order in and I messaged the patient through her MyChart today that she will need this due to starting Entresto. ? ?Thank you! ? ? ?

## 2021-07-23 ENCOUNTER — Other Ambulatory Visit (INDEPENDENT_AMBULATORY_CARE_PROVIDER_SITE_OTHER): Payer: BC Managed Care – PPO

## 2021-07-23 DIAGNOSIS — I5020 Unspecified systolic (congestive) heart failure: Secondary | ICD-10-CM

## 2021-07-23 DIAGNOSIS — Z79899 Other long term (current) drug therapy: Secondary | ICD-10-CM

## 2021-07-24 LAB — BASIC METABOLIC PANEL
BUN/Creatinine Ratio: 20 (ref 12–28)
BUN: 17 mg/dL (ref 8–27)
CO2: 23 mmol/L (ref 20–29)
Calcium: 9.7 mg/dL (ref 8.7–10.3)
Chloride: 98 mmol/L (ref 96–106)
Creatinine, Ser: 0.86 mg/dL (ref 0.57–1.00)
Glucose: 89 mg/dL (ref 70–99)
Potassium: 5.4 mmol/L — ABNORMAL HIGH (ref 3.5–5.2)
Sodium: 137 mmol/L (ref 134–144)
eGFR: 76 mL/min/{1.73_m2} (ref >59)

## 2021-07-25 ENCOUNTER — Telehealth: Payer: Self-pay | Admitting: Internal Medicine

## 2021-07-25 ENCOUNTER — Other Ambulatory Visit: Payer: Self-pay | Admitting: Nurse Practitioner

## 2021-07-25 DIAGNOSIS — E875 Hyperkalemia: Secondary | ICD-10-CM

## 2021-07-25 NOTE — Telephone Encounter (Signed)
BMP results from 07/23/21 called to the patient. ? ?Labs are all normal except for elevated K+ at 5.4. ? ?Reviewed verbally with Dr. Caryl Comes: ?New start Entresto 24/26 mg BID ~ 06/28/21. ?Per MD- no medication changes at this time, but will need a repeat BMP early next week.  ? ?The patient is aware of MD recommendations and voices understanding. ?She can come to the office on Tuesday 4/18 at 3:45 pm for a repeat BMP. ? ? ?

## 2021-07-30 ENCOUNTER — Ambulatory Visit (INDEPENDENT_AMBULATORY_CARE_PROVIDER_SITE_OTHER): Payer: BC Managed Care – PPO

## 2021-07-30 ENCOUNTER — Other Ambulatory Visit (INDEPENDENT_AMBULATORY_CARE_PROVIDER_SITE_OTHER): Payer: BC Managed Care – PPO

## 2021-07-30 DIAGNOSIS — E875 Hyperkalemia: Secondary | ICD-10-CM

## 2021-07-30 DIAGNOSIS — I428 Other cardiomyopathies: Secondary | ICD-10-CM | POA: Diagnosis not present

## 2021-07-30 LAB — CUP PACEART REMOTE DEVICE CHECK
Battery Remaining Longevity: 34 mo
Battery Remaining Percentage: 31 %
Battery Voltage: 2.93 V
Brady Statistic AP VP Percent: 20 %
Brady Statistic AP VS Percent: 1 %
Brady Statistic AS VP Percent: 79 %
Brady Statistic AS VS Percent: 1 %
Brady Statistic RA Percent Paced: 19 %
Brady Statistic RV Percent Paced: 99 %
Date Time Interrogation Session: 20230418105934
Implantable Lead Implant Date: 20160824
Implantable Lead Implant Date: 20160824
Implantable Lead Location: 753859
Implantable Lead Location: 753860
Implantable Pulse Generator Implant Date: 20160824
Lead Channel Impedance Value: 380 Ohm
Lead Channel Impedance Value: 450 Ohm
Lead Channel Pacing Threshold Amplitude: 0.5 V
Lead Channel Pacing Threshold Amplitude: 0.875 V
Lead Channel Pacing Threshold Pulse Width: 0.5 ms
Lead Channel Pacing Threshold Pulse Width: 0.5 ms
Lead Channel Sensing Intrinsic Amplitude: 10.4 mV
Lead Channel Sensing Intrinsic Amplitude: 4.3 mV
Lead Channel Setting Pacing Amplitude: 1.5 V
Lead Channel Setting Pacing Amplitude: 2 V
Lead Channel Setting Pacing Pulse Width: 0.5 ms
Lead Channel Setting Sensing Sensitivity: 2.5 mV
Pulse Gen Model: 3222
Pulse Gen Serial Number: 7802897

## 2021-07-31 LAB — BASIC METABOLIC PANEL
BUN/Creatinine Ratio: 15 (ref 12–28)
BUN: 16 mg/dL (ref 8–27)
CO2: 24 mmol/L (ref 20–29)
Calcium: 9.4 mg/dL (ref 8.7–10.3)
Chloride: 98 mmol/L (ref 96–106)
Creatinine, Ser: 1.05 mg/dL — ABNORMAL HIGH (ref 0.57–1.00)
Glucose: 106 mg/dL — ABNORMAL HIGH (ref 70–99)
Potassium: 5.1 mmol/L (ref 3.5–5.2)
Sodium: 135 mmol/L (ref 134–144)
eGFR: 60 mL/min/{1.73_m2} (ref >59)

## 2021-08-01 NOTE — Telephone Encounter (Signed)
Patient had her repeat lab work done on 07/30/21. ?Reviewed with MD- she lab reults for details. ? ?

## 2021-08-16 NOTE — Progress Notes (Signed)
Remote pacemaker transmission.   

## 2021-08-18 ENCOUNTER — Other Ambulatory Visit: Payer: Self-pay | Admitting: Internal Medicine

## 2021-10-29 ENCOUNTER — Ambulatory Visit (INDEPENDENT_AMBULATORY_CARE_PROVIDER_SITE_OTHER): Payer: BC Managed Care – PPO

## 2021-10-29 DIAGNOSIS — I428 Other cardiomyopathies: Secondary | ICD-10-CM | POA: Diagnosis not present

## 2021-10-29 LAB — CUP PACEART REMOTE DEVICE CHECK
Battery Remaining Longevity: 31 mo
Battery Remaining Percentage: 28 %
Battery Voltage: 2.93 V
Brady Statistic AP VP Percent: 27 %
Brady Statistic AP VS Percent: 1 %
Brady Statistic AS VP Percent: 72 %
Brady Statistic AS VS Percent: 1 %
Brady Statistic RA Percent Paced: 27 %
Brady Statistic RV Percent Paced: 99 %
Date Time Interrogation Session: 20230717112511
Implantable Lead Implant Date: 20160824
Implantable Lead Implant Date: 20160824
Implantable Lead Location: 753859
Implantable Lead Location: 753860
Implantable Pulse Generator Implant Date: 20160824
Lead Channel Impedance Value: 390 Ohm
Lead Channel Impedance Value: 490 Ohm
Lead Channel Pacing Threshold Amplitude: 0.5 V
Lead Channel Pacing Threshold Amplitude: 0.875 V
Lead Channel Pacing Threshold Pulse Width: 0.5 ms
Lead Channel Pacing Threshold Pulse Width: 0.5 ms
Lead Channel Sensing Intrinsic Amplitude: 10.4 mV
Lead Channel Sensing Intrinsic Amplitude: 2.3 mV
Lead Channel Setting Pacing Amplitude: 1.5 V
Lead Channel Setting Pacing Amplitude: 2 V
Lead Channel Setting Pacing Pulse Width: 0.5 ms
Lead Channel Setting Sensing Sensitivity: 2.5 mV
Pulse Gen Model: 3222
Pulse Gen Serial Number: 7802897

## 2021-11-26 NOTE — Progress Notes (Signed)
Remote pacemaker transmission.   

## 2022-01-24 ENCOUNTER — Other Ambulatory Visit: Payer: Self-pay | Admitting: Internal Medicine

## 2022-01-24 MED ORDER — SACUBITRIL-VALSARTAN 24-26 MG PO TABS: 1.0000 | ORAL_TABLET | Freq: Two times a day (BID) | ORAL | 4 refills | Status: DC

## 2022-01-28 ENCOUNTER — Ambulatory Visit (INDEPENDENT_AMBULATORY_CARE_PROVIDER_SITE_OTHER): Payer: BC Managed Care – PPO

## 2022-01-28 DIAGNOSIS — I428 Other cardiomyopathies: Secondary | ICD-10-CM

## 2022-01-28 LAB — CUP PACEART REMOTE DEVICE CHECK
Battery Remaining Longevity: 28 mo
Battery Remaining Percentage: 26 %
Battery Voltage: 2.92 V
Brady Statistic AP VP Percent: 31 %
Brady Statistic AP VS Percent: 1 %
Brady Statistic AS VP Percent: 69 %
Brady Statistic AS VS Percent: 1 %
Brady Statistic RA Percent Paced: 30 %
Brady Statistic RV Percent Paced: 99 %
Date Time Interrogation Session: 20231017080344
Implantable Lead Implant Date: 20160824
Implantable Lead Implant Date: 20160824
Implantable Lead Location: 753859
Implantable Lead Location: 753860
Implantable Pulse Generator Implant Date: 20160824
Lead Channel Impedance Value: 380 Ohm
Lead Channel Impedance Value: 480 Ohm
Lead Channel Pacing Threshold Amplitude: 0.5 V
Lead Channel Pacing Threshold Amplitude: 0.875 V
Lead Channel Pacing Threshold Pulse Width: 0.5 ms
Lead Channel Pacing Threshold Pulse Width: 0.5 ms
Lead Channel Sensing Intrinsic Amplitude: 10.4 mV
Lead Channel Sensing Intrinsic Amplitude: 2.6 mV
Lead Channel Setting Pacing Amplitude: 1.5 V
Lead Channel Setting Pacing Amplitude: 2 V
Lead Channel Setting Pacing Pulse Width: 0.5 ms
Lead Channel Setting Sensing Sensitivity: 2.5 mV
Pulse Gen Model: 3222
Pulse Gen Serial Number: 7802897

## 2022-02-03 ENCOUNTER — Other Ambulatory Visit: Payer: Self-pay | Admitting: Internal Medicine

## 2022-02-03 DIAGNOSIS — Z1231 Encounter for screening mammogram for malignant neoplasm of breast: Secondary | ICD-10-CM

## 2022-02-12 NOTE — Progress Notes (Signed)
Remote pacemaker transmission.   

## 2022-02-15 ENCOUNTER — Other Ambulatory Visit: Payer: Self-pay | Admitting: Internal Medicine

## 2022-03-17 ENCOUNTER — Ambulatory Visit
Admission: RE | Admit: 2022-03-17 | Discharge: 2022-03-17 | Disposition: A | Discharge status: Home / Self Care | Payer: BC Managed Care – PPO | Source: Ambulatory Visit | Attending: Internal Medicine | Admitting: Internal Medicine

## 2022-03-17 DIAGNOSIS — Z1231 Encounter for screening mammogram for malignant neoplasm of breast: Secondary | ICD-10-CM | POA: Diagnosis present

## 2022-04-29 ENCOUNTER — Ambulatory Visit: Payer: BC Managed Care – PPO | Attending: Internal Medicine

## 2022-04-29 DIAGNOSIS — I428 Other cardiomyopathies: Secondary | ICD-10-CM | POA: Diagnosis not present

## 2022-04-29 LAB — CUP PACEART REMOTE DEVICE CHECK
Battery Remaining Longevity: 25 mo
Battery Remaining Percentage: 23 %
Battery Voltage: 2.9 V
Brady Statistic AP VP Percent: 33 %
Brady Statistic AP VS Percent: 1 %
Brady Statistic AS VP Percent: 66 %
Brady Statistic AS VS Percent: 1 %
Brady Statistic RA Percent Paced: 33 %
Brady Statistic RV Percent Paced: 99 %
Date Time Interrogation Session: 20240116073900
Implantable Lead Connection Status: 753985
Implantable Lead Connection Status: 753985
Implantable Lead Implant Date: 20160824
Implantable Lead Implant Date: 20160824
Implantable Lead Location: 753859
Implantable Lead Location: 753860
Implantable Pulse Generator Implant Date: 20160824
Lead Channel Impedance Value: 390 Ohm
Lead Channel Impedance Value: 510 Ohm
Lead Channel Pacing Threshold Amplitude: 0.5 V
Lead Channel Pacing Threshold Amplitude: 1 V
Lead Channel Pacing Threshold Pulse Width: 0.5 ms
Lead Channel Pacing Threshold Pulse Width: 0.5 ms
Lead Channel Sensing Intrinsic Amplitude: 10.4 mV
Lead Channel Sensing Intrinsic Amplitude: 2.6 mV
Lead Channel Setting Pacing Amplitude: 1.5 V
Lead Channel Setting Pacing Amplitude: 2 V
Lead Channel Setting Pacing Pulse Width: 0.5 ms
Lead Channel Setting Sensing Sensitivity: 2.5 mV
Pulse Gen Model: 3222
Pulse Gen Serial Number: 7802897

## 2022-05-22 ENCOUNTER — Other Ambulatory Visit: Payer: Self-pay | Admitting: Internal Medicine

## 2022-05-22 NOTE — Telephone Encounter (Signed)
Please schedule overdue F/U appointment for 90 day refills. Thank you! 

## 2022-05-23 ENCOUNTER — Telehealth: Payer: Self-pay | Admitting: Internal Medicine

## 2022-05-23 MED ORDER — SACUBITRIL-VALSARTAN 24-26 MG PO TABS: 1.0000 | ORAL_TABLET | Freq: Two times a day (BID) | ORAL | 0 refills | Status: DC

## 2022-05-23 NOTE — Telephone Encounter (Signed)
Requested Prescriptions   Signed Prescriptions Disp Refills   sacubitril-valsartan (ENTRESTO) 24-26 MG 180 tablet 0    Sig: Take 1 tablet by mouth 2 (two) times daily.    Authorizing Provider: Deboraha Sprang    Ordering User: Britt Bottom

## 2022-05-23 NOTE — Telephone Encounter (Signed)
*  STAT* If patient is at the pharmacy, call can be transferred to refill team.   1. Which medications need to be refilled? (please list name of each medication and dose if known)  sacubitril-valsartan (ENTRESTO) 24-26 MG  2. Which pharmacy/location (including street and city if local pharmacy) is medication to be sent to? [facility] [id number] - [address], [place]  3. Do they need a 30 day or 90 day supply?   90 day supply

## 2022-05-23 NOTE — Progress Notes (Signed)
Remote pacemaker transmission.   

## 2022-06-06 NOTE — Telephone Encounter (Signed)
Scheduled 03/05

## 2022-06-17 ENCOUNTER — Encounter: Payer: Self-pay | Admitting: Internal Medicine

## 2022-06-17 ENCOUNTER — Ambulatory Visit: Payer: BC Managed Care – PPO | Attending: Internal Medicine | Admitting: Internal Medicine

## 2022-06-17 VITALS — BP 116/66 | HR 88 | Ht 64.0 in | Wt 175.0 lb

## 2022-06-17 DIAGNOSIS — I442 Atrioventricular block, complete: Secondary | ICD-10-CM | POA: Diagnosis not present

## 2022-06-17 DIAGNOSIS — Z950 Presence of cardiac pacemaker: Secondary | ICD-10-CM | POA: Diagnosis not present

## 2022-06-17 LAB — CUP PACEART INCLINIC DEVICE CHECK
Battery Remaining Longevity: 24 mo
Battery Voltage: 2.9 V
Brady Statistic RA Percent Paced: 33 %
Brady Statistic RV Percent Paced: 99.61 %
Date Time Interrogation Session: 20240305133402
Implantable Lead Connection Status: 753985
Implantable Lead Connection Status: 753985
Implantable Lead Implant Date: 20160824
Implantable Lead Implant Date: 20160824
Implantable Lead Location: 753859
Implantable Lead Location: 753860
Implantable Pulse Generator Implant Date: 20160824
Lead Channel Impedance Value: 387.5 Ohm
Lead Channel Impedance Value: 487.5 Ohm
Lead Channel Pacing Threshold Amplitude: 0.5 V
Lead Channel Pacing Threshold Amplitude: 0.5 V
Lead Channel Pacing Threshold Amplitude: 0.75 V
Lead Channel Pacing Threshold Amplitude: 0.75 V
Lead Channel Pacing Threshold Pulse Width: 0.5 ms
Lead Channel Pacing Threshold Pulse Width: 0.5 ms
Lead Channel Pacing Threshold Pulse Width: 0.5 ms
Lead Channel Pacing Threshold Pulse Width: 0.5 ms
Lead Channel Sensing Intrinsic Amplitude: 2.2 mV
Lead Channel Sensing Intrinsic Amplitude: 6.7 mV
Lead Channel Setting Pacing Amplitude: 1.5 V
Lead Channel Setting Pacing Amplitude: 2 V
Lead Channel Setting Pacing Pulse Width: 0.5 ms
Lead Channel Setting Sensing Sensitivity: 2.5 mV
Pulse Gen Model: 3222
Pulse Gen Serial Number: 7802897

## 2022-06-17 LAB — PACEMAKER DEVICE OBSERVATION

## 2022-06-17 NOTE — Progress Notes (Signed)
Patient ID: Nejla D Homesley, female   DOB: 01/19/1956, 67 y.o.   MRN: [id number]        Patient Care Team: Gladstone Lighter, MD as PCP - General (Internal Medicine) Deboraha Sprang, MD as PCP - Cardiology (Cardiology)   HPI  Rayshawn D Formanek is a 67 y.o. 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  Aug 16  underwent extraction of the initial system 2/2 noise on both her atrial and ventricular leads and new device implanted ipsilaterally  There was concern for radiation injury given prolonged exposure and hence LV lead placement was not  further pursued after long difficulty in cannulating the cs (GT)  Interval introduction of Guideline directed medical therapy given LV dysfunction with low dose carvedilol and aldactone without orthostasis; echo had also showed moderate effusion which resolved without drainage Entresto and SGLT2's have been precluded because of cost  The patient denies chest pai, shortness of breath, nocturnal dyspnea, orthopnea or peripheral edema.  There have been no palpitations, lightheadedness or syncope.  Has lost 25 pounds and is doing better..   Her son remains deployed in [place] with return scheduled for summer 2025.  More heart breaking is that they have had no communication from him since 10/24     DATE TEST EF%   2010   Echo 45%   7/15 Echo   40-45%   7/16 Myoview   40% No ischemia  10/16  Cath  30-35% CA patent  3/22 Echo 25-30%   7/22 Echo  25-30%    Date Cr K Dig Hgb  6/21 0.74 4.3  (12/20) 15.1  6/22  0.78 5.2 0.9 (3/22) (1/22)15.2  7/23 0.8 4.5 0.9 (3/23) 14.3 (3/23)    Past Medical History:  Diagnosis Date   Anxiety    takes Klonopin daily as needed   Arthritis    Back pain    reason unknown   Chronic systolic CHF (congestive heart failure) ([place])    a. Echo 10/2013: EF 40-45 (apical images 35-40%); b. Echo 12/2014: EF 35-40%; c. 06/2020  Echo: EF 25-30%, glob HK, Gr2 DD, RVSP ~ 53mHg; d. 10/2020 Echo: EF 25-30%, glob HK. Nl RV fxn, nl PASP, mildly dil LA. Small peric eff, triv MR.   [place] block ([place]    a. s/p pacemaker insertion 1996; b. s/p contralateral side St. Jude generator change 11/2014.   Dizziness    occasionally   GERD (gastroesophageal reflux disease)    takes Zantac daily as needed   Gout    Headache    occasionally   History of blood clots    in heart-was on Coumadin---this many yrs ago   History of colon polyps    benign   Hyperlipidemia    takes Lovastatin daily   Hypertension    hx of-since hip replacement MD took resident off meds 10/30/14 b/c well controlled   Hypothyroid    takes Synthroid daily   Joint pain    Joint swelling    Moderate Pericardial effusion    a. 07/2020 Echo: Mod effusion w/o tamponade; b. 10/2020 Echo: small peric eff w/o tamponade.   NICM (nonischemic cardiomyopathy) ([place]    a. tachycardia induced->EF 40-45% (2010), 35-40% (10/2013), 35-40% Gr1 DD, mod TR (12/2014);  b. 01/2015 Cath: Nl cors, EF 30-35%, glob HK; c. 06/2020 Echo: EF 25-30%; d. 10/2020 Echo: EF 25-30%.   Numbness and tingling  left leg and right arm   Ovarian cancer (HCC)    ovarian, skin    Pacemaker lead failure--noise on atrial greater than ventricular lead with ventricular backup pulse pacing 10/17/2014   Peripheral artery disease ([place])    stent on lt illiac    Past Surgical History:  Procedure Laterality Date   ABDOMINAL HYSTERECTOMY     ANGIOPLASTY / STENTING ILIAC     CARDIAC CATHETERIZATION N/A 02/08/2015   Procedure: Left Heart Cath and Coronary Angiography;  Surgeon: Wellington Hampshire, MD;  Location: [facility];  Service: Cardiovascular;  Laterality: N/A;   CERVICAL CERCLAGE  31yr ago   COLONOSCOPY     DG BARIUM SWALLOW ([place])     DILATION AND CURETTAGE OF UTERUS     EP IMPLANTABLE DEVICE Left 12/06/2014   Procedure: Implantation of cardiac resynchronization therapy pacemaker;   Surgeon: GEvans Lance MD;  [facility]  Service: Cardiovascular;  Laterality: Left;   HERNIA MESH REMOVAL Right    incisional   HERNIA REPAIR     ILIAC ARTERY STENT  Apr 05, 2010   Left artery stenting   INSERT / REPLACE / REMOVE PACEMAKER     JOINT REPLACEMENT  October 31, 2014   hip   KNEE SURGERY Right    PACEMAKER INSERTION  2012   [place] 2012[place]   Procedure: REMOVAL OF RV AND RA PACEMAKER LEADS;  Surgeon: GEvans Lance MD;  [facility]  Service: Cardiovascular;  Laterality: Right;  DR. BARTLE TO BACK UP    PACEMAKER PLACEMENT Left 12/06/14   TOTAL ABDOMINAL HYSTERECTOMY W/ BILATERAL SALPINGOOPHORECTOMY  07/2008   Ovarian cancer   TOTAL HIP ARTHROPLASTY Right 10/31/2014   Procedure: TOTAL HIP ARTHROPLASTY ANTERIOR APPROACH;  Surgeon: MHessie Knows MD;  Location: [facility];  Service: Orthopedics;  Laterality: Right;    Current Outpatient Medications  Medication Sig Dispense Refill   aspirin EC 81 MG tablet Take 81 mg by mouth daily.     carvedilol (COREG) 3.125 MG tablet TAKE 1 TABLET BY MOUTH TWICE A DAY WITH A MEAL 180 tablet 0   clonazePAM (KLONOPIN) 0.5 MG tablet TAKE 1 TABLET BY MOUTH ONCE DAILY AS NEEDED FOR ANXIETY/ SLEEP 30 tablet 0   digoxin (LANOXIN) 0.125 MG tablet Take 1 tablet (125 mcg total) by mouth daily. 90 tablet 1   levothyroxine (SYNTHROID) 112 MCG tablet Take 1 tablet by mouth once daily 90 tablet 1   lovastatin (MEVACOR) 20 MG tablet TAKE 1 TABLET BY MOUTH AT BEDTIME 90 tablet 0   oxybutynin (DITROPAN-XL) 5 MG 24 hr tablet Take 1 tablet by mouth daily.     sacubitril-valsartan (ENTRESTO) 24-26 MG Take 1 tablet by mouth 2 (two) times daily. 180 tablet 0   spironolactone (ALDACTONE) 25 MG tablet TAKE 1/2 TABLET BY MOUTH EVERY DAY 45 tablet 3   zinc gluconate 50 MG tablet Take 50 mg by mouth daily.     No current facility-administered medications for this visit.    Allergies   Allergen Reactions   Marshmallow [Althaea Officinalis]     Rash, hives, itching, SOB   Meat Extract     Rash, hives itching, SOB.  Can tolerate some meats ONLY in moderation   Other     Jello - Rash, hives, itching, SOB   Contrast Media [Iodinated Contrast Media]     unknown   Latex  rash   Nickel     rash   Physical Exam: BP 116/66 (BP Location: Left Arm, Patient Position: Sitting, Cuff Size: Normal)   Pulse 88   Ht '5\' 4"'$  (1.626 m)   Wt 175 lb (79.4 kg)   LMP  (LMP Unknown)   SpO2 98%   BMI 30.04 kg/m  Well developed and well nourished in no acute distress HENT normal Neck supple with JVP-flat Clear Device pocket well healed; without hematoma or erythema.  There is no tethering  Regular rate and rhythm, no murmur Abd-soft with active BS No Clubbing cyanosis  edema Skin-warm and dry A & Oriented  Grossly normal sensory and motor function  ECG sinus with P synchronous pacing  Device function is normal. Programming changes   See Paceart for details      Assessment and  Plan  Complete heart block-congenital  Pacemaker-St. Jude  Nonsichemic Cardiomyopathy    Congestive heart failure-chronic systolic class 2  Orthostatic LH   Peripheral vascular disease status post iliac stenting  Pacemaker lead revision dual chamber CRT-P can with failure to cannulate the coronary sinus     Now on Entresto and carvedilol and spironolactone digoxin; tolerating continue for her cardiomyopathy  Orthostatic lightheadedness minimal  Exercise tolerance is improved considerably with walking no leg pain.  Euvolemic.  Continue Aldactone  Pwp

## 2022-06-17 NOTE — Patient Instructions (Signed)
Medication Instructions:  Your physician recommends that you continue on your current medications as directed. Please refer to the Current Medication list given to you today.  *If you need a refill on your cardiac medications before your next appointment, please call your pharmacy*   Lab Work: -None ordered If you have labs (blood work) drawn today and your tests are completely normal, you will receive your results only by: [place] (if you have MyChart) OR A paper copy in the mail If you have any lab test that is abnormal or we need to change your treatment, we will call you to review the results.   Testing/Procedures: -None ordered   Follow-Up: At [facility], you and your health needs are our priority.  As part of our continuing mission to provide you with exceptional heart care, we have created designated Provider Care Teams.  These Care Teams include your primary Cardiologist (physician) and Advanced Practice Providers (APPs -  Physician Assistants and Nurse Practitioners) who all work together to provide you with the care you need, when you need it.  We recommend signing up for the patient portal called "MyChart".  Sign up information is provided on this After Visit Summary.  MyChart is used to connect with patients for Virtual Visits (Telemedicine).  Patients are able to view lab/test results, encounter notes, upcoming appointments, etc.  Non-urgent messages can be sent to your provider as well.   To learn more about what you can do with MyChart, go to [email or web address].    Your next appointment:   1 year(s)  Provider:   Virl Axe, MD    Other Instructions -None

## 2022-06-20 ENCOUNTER — Encounter: Payer: Self-pay | Admitting: Internal Medicine

## 2022-06-25 ENCOUNTER — Encounter: Payer: Self-pay | Admitting: Internal Medicine

## 2022-06-27 ENCOUNTER — Telehealth: Payer: Self-pay | Admitting: Internal Medicine

## 2022-06-27 DIAGNOSIS — Z79899 Other long term (current) drug therapy: Secondary | ICD-10-CM

## 2022-06-27 DIAGNOSIS — I442 Atrioventricular block, complete: Secondary | ICD-10-CM

## 2022-06-27 MED ORDER — DIGOXIN 125 MCG PO TABS: 0.1250 mg | ORAL_TABLET | ORAL | 3 refills | Status: DC

## 2022-06-27 NOTE — Addendum Note (Signed)
Addended by: Joni Reining on: 06/27/2022 10:31 AM   Modules accepted: Orders

## 2022-06-27 NOTE — Telephone Encounter (Signed)
Spoke to Dr. Caryl Comes who advises patient to change the frequency of her digoxin to every other day and to repeat digoxin labs in 3 weeks. Patient verbalizes understanding, med list updated and orders placed.

## 2022-06-27 NOTE — Telephone Encounter (Signed)
Patient states she is calling in to report her digoxin levels per Dr. Olin Pia instructions. She states her digoxin level was drawn by her PCP this past Tuesday and it was 1. She denies any SOB, chest pain or palpitations. Secure chat to Dr. Caryl Comes to report.

## 2022-06-27 NOTE — Telephone Encounter (Signed)
LMTCB

## 2022-06-27 NOTE — Telephone Encounter (Signed)
Patient calling in because her Digoxin level is at 1. Please advise

## 2022-07-21 ENCOUNTER — Encounter: Payer: Self-pay | Admitting: Internal Medicine

## 2022-07-24 ENCOUNTER — Other Ambulatory Visit: Payer: Self-pay | Admitting: Internal Medicine

## 2022-07-29 ENCOUNTER — Ambulatory Visit (INDEPENDENT_AMBULATORY_CARE_PROVIDER_SITE_OTHER): Payer: BC Managed Care – PPO

## 2022-07-29 DIAGNOSIS — I442 Atrioventricular block, complete: Secondary | ICD-10-CM

## 2022-07-30 ENCOUNTER — Encounter: Payer: Self-pay | Admitting: Internal Medicine

## 2022-07-30 LAB — CUP PACEART REMOTE DEVICE CHECK
Battery Remaining Longevity: 22 mo
Battery Remaining Percentage: 20 %
Battery Voltage: 2.9 V
Brady Statistic AP VP Percent: 33 %
Brady Statistic AP VS Percent: 1 %
Brady Statistic AS VP Percent: 66 %
Brady Statistic AS VS Percent: 1 %
Brady Statistic RA Percent Paced: 33 %
Brady Statistic RV Percent Paced: 99 %
Date Time Interrogation Session: 20240416091427
Implantable Lead Connection Status: 753985
Implantable Lead Connection Status: 753985
Implantable Lead Implant Date: 20160824
Implantable Lead Implant Date: 20160824
Implantable Lead Location: 753859
Implantable Lead Location: 753860
Implantable Pulse Generator Implant Date: 20160824
Lead Channel Impedance Value: 410 Ohm
Lead Channel Impedance Value: 510 Ohm
Lead Channel Pacing Threshold Amplitude: 0.5 V
Lead Channel Pacing Threshold Amplitude: 1 V
Lead Channel Pacing Threshold Pulse Width: 0.5 ms
Lead Channel Pacing Threshold Pulse Width: 0.5 ms
Lead Channel Sensing Intrinsic Amplitude: 5 mV
Lead Channel Sensing Intrinsic Amplitude: 6.7 mV
Lead Channel Setting Pacing Amplitude: 1.5 V
Lead Channel Setting Pacing Amplitude: 2 V
Lead Channel Setting Pacing Pulse Width: 0.5 ms
Lead Channel Setting Sensing Sensitivity: 2.5 mV
Pulse Gen Model: 3222
Pulse Gen Serial Number: 7802897

## 2022-09-01 NOTE — Progress Notes (Signed)
Remote pacemaker transmission.   

## 2022-09-17 ENCOUNTER — Other Ambulatory Visit: Payer: Self-pay

## 2022-09-17 MED ORDER — SACUBITRIL-VALSARTAN 24-26 MG PO TABS: 1.0000 | ORAL_TABLET | Freq: Two times a day (BID) | ORAL | 2 refills | Status: DC

## 2022-09-17 NOTE — Telephone Encounter (Signed)
Requested Prescriptions   Signed Prescriptions Disp Refills   sacubitril-valsartan (ENTRESTO) 24-26 MG 180 tablet 2    Sig: Take 1 tablet by mouth 2 (two) times daily.    Authorizing Provider: Duke Salvia    Ordering User: Guerry Minors

## 2022-10-28 ENCOUNTER — Ambulatory Visit (INDEPENDENT_AMBULATORY_CARE_PROVIDER_SITE_OTHER): Payer: PRIVATE HEALTH INSURANCE

## 2022-10-28 DIAGNOSIS — I442 Atrioventricular block, complete: Secondary | ICD-10-CM | POA: Diagnosis not present

## 2022-10-29 LAB — CUP PACEART REMOTE DEVICE CHECK
Battery Remaining Longevity: 19 mo
Battery Remaining Percentage: 18 %
Battery Voltage: 2.89 V
Brady Statistic AP VP Percent: 32 %
Brady Statistic AP VS Percent: 1 %
Brady Statistic AS VP Percent: 68 %
Brady Statistic AS VS Percent: 1 %
Brady Statistic RA Percent Paced: 31 %
Brady Statistic RV Percent Paced: 99 %
Date Time Interrogation Session: 20240716075803
Implantable Lead Connection Status: 753985
Implantable Lead Connection Status: 753985
Implantable Lead Implant Date: 20160824
Implantable Lead Implant Date: 20160824
Implantable Lead Location: 753859
Implantable Lead Location: 753860
Implantable Pulse Generator Implant Date: 20160824
Lead Channel Impedance Value: 390 Ohm
Lead Channel Impedance Value: 480 Ohm
Lead Channel Pacing Threshold Amplitude: 0.625 V
Lead Channel Pacing Threshold Amplitude: 1 V
Lead Channel Pacing Threshold Pulse Width: 0.5 ms
Lead Channel Pacing Threshold Pulse Width: 0.5 ms
Lead Channel Sensing Intrinsic Amplitude: 5 mV
Lead Channel Sensing Intrinsic Amplitude: 6.7 mV
Lead Channel Setting Pacing Amplitude: 1.625
Lead Channel Setting Pacing Amplitude: 2 V
Lead Channel Setting Pacing Pulse Width: 0.5 ms
Lead Channel Setting Sensing Sensitivity: 2.5 mV
Pulse Gen Model: 3222
Pulse Gen Serial Number: 7802897

## 2022-11-14 NOTE — Progress Notes (Signed)
Remote pacemaker transmission.   

## 2022-11-17 ENCOUNTER — Other Ambulatory Visit: Payer: Self-pay | Admitting: Internal Medicine

## 2022-11-17 DIAGNOSIS — F17210 Nicotine dependence, cigarettes, uncomplicated: Secondary | ICD-10-CM

## 2022-11-18 ENCOUNTER — Ambulatory Visit
Admission: RE | Admit: 2022-11-18 | Discharge: 2022-11-18 | Disposition: A | Discharge status: Home / Self Care | Payer: No Typology Code available for payment source | Source: Ambulatory Visit | Attending: Internal Medicine | Admitting: Internal Medicine

## 2022-11-18 DIAGNOSIS — F17210 Nicotine dependence, cigarettes, uncomplicated: Secondary | ICD-10-CM

## 2022-12-11 ENCOUNTER — Telehealth: Payer: Self-pay | Admitting: Internal Medicine

## 2022-12-11 NOTE — Telephone Encounter (Signed)
Pt states she forgot her medication at home and wants to know if she will be okay to miss a dosage. Please advise.

## 2022-12-11 NOTE — Telephone Encounter (Signed)
Called pt at her work number and pt reports she forgot to take her morning dose of: carvedilol (COREG) 3.125 MG sacubitril-valsartan (ENTRESTO) 24-26 MG  spironolactone (ALDACTONE) 25 MG tablet TAKE 1/2 TABLET BY MOUTH EVERY DAY  Pt asked to take the nighttime dose of these meds as scheduled and skip the morning dose. Pt encouraged to call or msg if having further questions. Pt verbalized understanding.

## 2022-12-23 ENCOUNTER — Telehealth: Payer: Self-pay | Admitting: Internal Medicine

## 2022-12-23 NOTE — Telephone Encounter (Signed)
Pt c/o medication issue:  1. Name of Medication: digoxin (LANOXIN) 0.125 MG tablet   2. How are you currently taking this medication (dosage and times per day)? Take 1 tablet (0.125 mg total) by mouth every other day.   3. Are you having a reaction (difficulty breathing--STAT)? No  4. What is your medication issue? Patient is calling because she had her Digoxin tested in April of this year and its was at a .5. Patient states she had labs completed again yesterday, her Digoxin was at 1.3. Patient would like to know what Dr. Graciela Husbands would like to do since it has gone up. Please advise.

## 2022-12-24 NOTE — Telephone Encounter (Signed)
Patient is calling to follow up. Patient stated we can call her back or send it through MyChart.

## 2022-12-24 NOTE — Telephone Encounter (Signed)
Patient is following up due to not hearing back from anyone yet. She is requesting a call back as soon as possible. Patient states she is very concerned because she does not know if she needs to take her digoxin. Please advise.

## 2022-12-24 NOTE — Telephone Encounter (Signed)
Stop dig  Called patient

## 2023-01-01 ENCOUNTER — Encounter: Payer: Self-pay | Admitting: Internal Medicine

## 2023-01-05 NOTE — Telephone Encounter (Signed)
Please Inform Patient That losartan does not add anything to her entresto And aks her if she has ever been on SGLT2 Thanks

## 2023-01-12 ENCOUNTER — Encounter: Payer: Self-pay | Admitting: Internal Medicine

## 2023-01-15 NOTE — Telephone Encounter (Signed)
I called and spoke with the patient last week about the thinking of the SGLT2.  She was agreeable.  If we could go ahead and start her on Jardiance 10 that would be great

## 2023-01-16 MED ORDER — EMPAGLIFLOZIN 10 MG PO TABS: 10.0000 mg | ORAL_TABLET | Freq: Every day | ORAL | 3 refills | Status: DC

## 2023-01-20 ENCOUNTER — Other Ambulatory Visit: Payer: Self-pay

## 2023-01-20 MED ORDER — EMPAGLIFLOZIN 10 MG PO TABS: 10.0000 mg | ORAL_TABLET | Freq: Every day | ORAL | 2 refills | Status: DC

## 2023-01-27 ENCOUNTER — Ambulatory Visit (INDEPENDENT_AMBULATORY_CARE_PROVIDER_SITE_OTHER): Payer: PRIVATE HEALTH INSURANCE

## 2023-01-27 DIAGNOSIS — I442 Atrioventricular block, complete: Secondary | ICD-10-CM | POA: Diagnosis not present

## 2023-01-28 LAB — CUP PACEART REMOTE DEVICE CHECK
Battery Remaining Longevity: 16 mo
Battery Remaining Percentage: 15 %
Battery Voltage: 2.87 V
Brady Statistic AP VP Percent: 28 %
Brady Statistic AP VS Percent: 1 %
Brady Statistic AS VP Percent: 72 %
Brady Statistic AS VS Percent: 1 %
Brady Statistic RA Percent Paced: 27 %
Brady Statistic RV Percent Paced: 99 %
Date Time Interrogation Session: 20241015105922
Implantable Lead Connection Status: 753985
Implantable Lead Connection Status: 753985
Implantable Lead Implant Date: 20160824
Implantable Lead Implant Date: 20160824
Implantable Lead Location: 753859
Implantable Lead Location: 753860
Implantable Pulse Generator Implant Date: 20160824
Lead Channel Impedance Value: 410 Ohm
Lead Channel Impedance Value: 480 Ohm
Lead Channel Pacing Threshold Amplitude: 0.625 V
Lead Channel Pacing Threshold Amplitude: 1.125 V
Lead Channel Pacing Threshold Pulse Width: 0.5 ms
Lead Channel Pacing Threshold Pulse Width: 0.5 ms
Lead Channel Sensing Intrinsic Amplitude: 5 mV
Lead Channel Sensing Intrinsic Amplitude: 6.7 mV
Lead Channel Setting Pacing Amplitude: 1.625
Lead Channel Setting Pacing Amplitude: 2.125
Lead Channel Setting Pacing Pulse Width: 0.5 ms
Lead Channel Setting Sensing Sensitivity: 2.5 mV
Pulse Gen Model: 3222
Pulse Gen Serial Number: 7802897

## 2023-02-06 ENCOUNTER — Other Ambulatory Visit: Payer: Self-pay | Admitting: Internal Medicine

## 2023-02-06 DIAGNOSIS — Z1231 Encounter for screening mammogram for malignant neoplasm of breast: Secondary | ICD-10-CM

## 2023-02-16 NOTE — Progress Notes (Signed)
Remote pacemaker transmission.   

## 2023-03-20 ENCOUNTER — Ambulatory Visit
Admission: RE | Admit: 2023-03-20 | Discharge: 2023-03-20 | Disposition: A | Discharge status: Home / Self Care | Payer: PRIVATE HEALTH INSURANCE | Source: Ambulatory Visit | Attending: Internal Medicine | Admitting: Internal Medicine

## 2023-03-20 DIAGNOSIS — Z1231 Encounter for screening mammogram for malignant neoplasm of breast: Secondary | ICD-10-CM | POA: Diagnosis present

## 2023-04-13 ENCOUNTER — Other Ambulatory Visit: Payer: Self-pay

## 2023-04-13 NOTE — Telephone Encounter (Signed)
Error

## 2023-04-28 ENCOUNTER — Ambulatory Visit (INDEPENDENT_AMBULATORY_CARE_PROVIDER_SITE_OTHER): Payer: PRIVATE HEALTH INSURANCE

## 2023-04-28 DIAGNOSIS — I442 Atrioventricular block, complete: Secondary | ICD-10-CM

## 2023-04-28 LAB — CUP PACEART REMOTE DEVICE CHECK
Battery Remaining Longevity: 13 mo
Battery Remaining Percentage: 12 %
Battery Voltage: 2.84 V
Brady Statistic AP VP Percent: 24 %
Brady Statistic AP VS Percent: 1 %
Brady Statistic AS VP Percent: 76 %
Brady Statistic AS VS Percent: 1 %
Brady Statistic RA Percent Paced: 24 %
Brady Statistic RV Percent Paced: 99 %
Date Time Interrogation Session: 20250114040014
Implantable Lead Connection Status: 753985
Implantable Lead Connection Status: 753985
Implantable Lead Implant Date: 20160824
Implantable Lead Implant Date: 20160824
Implantable Lead Location: 753859
Implantable Lead Location: 753860
Implantable Pulse Generator Implant Date: 20160824
Lead Channel Impedance Value: 390 Ohm
Lead Channel Impedance Value: 480 Ohm
Lead Channel Pacing Threshold Amplitude: 0.75 V
Lead Channel Pacing Threshold Amplitude: 1.125 V
Lead Channel Pacing Threshold Pulse Width: 0.5 ms
Lead Channel Pacing Threshold Pulse Width: 0.5 ms
Lead Channel Sensing Intrinsic Amplitude: 5 mV
Lead Channel Sensing Intrinsic Amplitude: 6.7 mV
Lead Channel Setting Pacing Amplitude: 1.75 V
Lead Channel Setting Pacing Amplitude: 2.125
Lead Channel Setting Pacing Pulse Width: 0.5 ms
Lead Channel Setting Sensing Sensitivity: 2.5 mV
Pulse Gen Model: 3222
Pulse Gen Serial Number: 7802897

## 2023-05-05 ENCOUNTER — Other Ambulatory Visit: Payer: Self-pay

## 2023-05-05 MED ORDER — SACUBITRIL-VALSARTAN 24-26 MG PO TABS: 1.0000 | ORAL_TABLET | Freq: Two times a day (BID) | ORAL | 0 refills | Status: DC

## 2023-06-10 ENCOUNTER — Encounter: Payer: Self-pay | Admitting: Internal Medicine

## 2023-06-10 NOTE — Progress Notes (Signed)
 Remote pacemaker transmission.

## 2023-06-16 NOTE — Telephone Encounter (Signed)
 Nothing that does the same thing  We will do the best we can

## 2023-07-02 ENCOUNTER — Encounter: Payer: PRIVATE HEALTH INSURANCE | Admitting: Internal Medicine

## 2023-07-07 ENCOUNTER — Ambulatory Visit: Payer: PRIVATE HEALTH INSURANCE | Attending: Cardiology | Admitting: Cardiology

## 2023-07-07 ENCOUNTER — Encounter: Payer: Self-pay | Admitting: Cardiology

## 2023-07-07 VITALS — BP 105/68 | HR 78 | Resp 23 | Ht 64.0 in | Wt 178.1 lb

## 2023-07-07 DIAGNOSIS — Z950 Presence of cardiac pacemaker: Secondary | ICD-10-CM | POA: Diagnosis not present

## 2023-07-07 DIAGNOSIS — I5020 Unspecified systolic (congestive) heart failure: Secondary | ICD-10-CM

## 2023-07-07 DIAGNOSIS — I442 Atrioventricular block, complete: Secondary | ICD-10-CM | POA: Diagnosis not present

## 2023-07-07 DIAGNOSIS — I428 Other cardiomyopathies: Secondary | ICD-10-CM | POA: Diagnosis not present

## 2023-07-07 LAB — CUP PACEART INCLINIC DEVICE CHECK
Battery Remaining Longevity: 10 mo
Battery Voltage: 2.84 V
Brady Statistic RA Percent Paced: 22 %
Brady Statistic RV Percent Paced: 99.64 %
Date Time Interrogation Session: 20250325160635
Implantable Lead Connection Status: 753985
Implantable Lead Connection Status: 753985
Implantable Lead Implant Date: 20160824
Implantable Lead Implant Date: 20160824
Implantable Lead Location: 753859
Implantable Lead Location: 753860
Implantable Pulse Generator Implant Date: 20160824
Lead Channel Impedance Value: 387.5 Ohm
Lead Channel Impedance Value: 462.5 Ohm
Lead Channel Pacing Threshold Amplitude: 0.75 V
Lead Channel Pacing Threshold Amplitude: 0.75 V
Lead Channel Pacing Threshold Amplitude: 1 V
Lead Channel Pacing Threshold Amplitude: 1 V
Lead Channel Pacing Threshold Pulse Width: 0.5 ms
Lead Channel Pacing Threshold Pulse Width: 0.5 ms
Lead Channel Pacing Threshold Pulse Width: 0.5 ms
Lead Channel Pacing Threshold Pulse Width: 0.5 ms
Lead Channel Sensing Intrinsic Amplitude: 4.6 mV
Lead Channel Sensing Intrinsic Amplitude: 4.9 mV
Lead Channel Setting Pacing Amplitude: 1.625
Lead Channel Setting Pacing Amplitude: 2 V
Lead Channel Setting Pacing Pulse Width: 0.5 ms
Lead Channel Setting Sensing Sensitivity: 2.5 mV
Pulse Gen Model: 3222
Pulse Gen Serial Number: 7802897

## 2023-07-07 NOTE — Patient Instructions (Addendum)
 Medication Instructions:  No changes at this time.   *If you need a refill on your cardiac medications before your next appointment, please call your pharmacy*   Lab Work: BMET today here in the office.   If you have labs (blood work) drawn today and your tests are completely normal, you will receive your results only by: MyChart Message (if you have MyChart) OR A paper copy in the mail If you have any lab test that is abnormal or we need to change your treatment, we will call you to review the results.   Testing/Procedures: Your physician has requested that you have an echocardiogram in 6 months.   Echocardiography is a painless test that uses sound waves to create images of your heart. It provides your doctor with information about the size and shape of your heart and how well your heart's chambers and valves are working. This procedure takes approximately one hour. There are no restrictions for this procedure. Please do NOT wear cologne, perfume, aftershave, or lotions (deodorant is allowed). Please arrive 15 minutes prior to your appointment time.  Please note: We ask at that you not bring children with you during ultrasound (echo/ vascular) testing. Due to room size and safety concerns, children are not allowed in the ultrasound rooms during exams. Our front office staff cannot provide observation of children in our lobby area while testing is being conducted. An adult accompanying a patient to their appointment will only be allowed in the ultrasound room at the discretion of the ultrasound technician under special circumstances. We apologize for any inconvenience.    Follow-Up: At [facility], you and your health needs are our priority.  As part of our continuing mission to provide you with exceptional heart care, we have created designated Provider Care Teams.  These Care Teams include your primary Cardiologist (physician) and Advanced Practice Providers (APPs -  Physician  Assistants and Nurse Practitioners) who all work together to provide you with the care you need, when you need it.    Your next appointment:   6 month(s) after echo has been done.   Provider:   Sherryl Manges, MD, Dr. Jimmey Ralph, or Dr. Lalla Brothers

## 2023-07-07 NOTE — Progress Notes (Signed)
 Electrophysiology Clinic Note    Date:  07/07/2023  Patient ID:  [name] [name], DOB [birth year], MRN [id number] PCP:  Enid Baas, MD  Cardiologist:  Sherryl Manges, MD Electrophysiologist: Sherryl Manges, MD   Discussed the use of AI scribe software for clinical note transcription with the patient, who gave verbal consent to proceed.   Patient Profile    Chief Complaint: device follow-up  History of Present Illness: [name] [name] is a 68 y.o. female with PMH notable for CHB s/p PPM, ICM, HFrEF, PAD, HTN, hypothyroid; seen today for Sherryl Manges, MD for routine electrophysiology followup.   Complicated device history, ultimately resulting in L-sided dual chamber system d/t inability to cannulate CS for LV lead.  She last saw Dr. Graciela Husbands 06/2022. Overall was doing well.   On follow-up today, she has had intermittent chest discomfort, thinks that it's heart burn because it is relieved with nexium.  She recently stopped jardiance d/t recurrent yeast infections and UTIs. Since stopping jardiance, has not had any further infections.  She remains quite active, walking 20-30 minutes per day, has noticed that she gets a little winded when walking with someone else, but thinks that it's because they're taking to each other.  Activities are limited by hip pain. She denies chest pain, palpitations, lower extremity edema, or abd bloating.       Arrhythmia/Device History [employer] Jude dual chamber PPM Initial implant 1990 2016 - r-sided extraction d/t atrial and vent noise 2016 -left-sided reimplantation w CRT-P can, unable to cannulate cs      ROS:  Please see the history of present illness. All other systems are reviewed and otherwise negative.    Physical Exam    VS:  BP 105/68 (BP Location: Left Arm, Patient Position: Sitting, Cuff Size: Normal)   Pulse 78   Resp (!) 23   Ht 5\' 4"  (1.626 m)   Wt 178 lb 2 oz (80.8 kg)   LMP  (LMP Unknown)   BMI 30.58 kg/m  BMI: Body mass  index is 30.58 kg/m.  Wt Readings from Last 3 Encounters:  07/07/23 178 lb 2 oz (80.8 kg)  06/17/22 175 lb (79.4 kg)  06/04/21 200 lb (90.7 kg)     GEN- The patient is well appearing, alert and oriented x 3 today.   Lungs- Clear to ausculation bilaterally, normal work of breathing.  Heart- Regular rate and rhythm, no murmurs, rubs or gallops Extremities- No peripheral edema, warm, dry Skin-  device pocket well-healed, no tethering   Device interrogation done today and reviewed by myself:  Battery 10 months Lead thresholds, impedence, sensing stable  High VP, dependent down to VVI 40 Histograms elevated Very brief AT episodes noted No changes made today   Studies Reviewed   Previous EP, cardiology notes.    EKG is ordered. Personal review of EKG from today shows:    EKG Interpretation Date/Time:  Tuesday July 07 2023 14:01:06 EDT Ventricular Rate:  66 PR Interval:  172 QRS Duration:  196 QT Interval:  516 QTC Calculation: 540 R Axis:   -69  Text Interpretation: Atrial-sensed ventricular-paced rhythm When compared with ECG of 03-Jan-2015 09:58, No significant change was found Confirmed by Sherie Don [phone]) on 07/07/2023 2:11:06 PM    TTE, 10/16/2020  1. Left ventricular ejection fraction, by estimation, is 25 to 30%. The left ventricle has severely decreased function. The left ventricle demonstrates global hypokinesis. The left ventricular internal cavity size was severely dilated. Left ventricular diastolic parameters  are indeterminate. The average left ventricular  global longitudinal strain is -8.8 %. The global longitudinal strain is abnormal.   2. Right ventricular systolic function is normal. The right ventricular size is normal. There is normal pulmonary artery systolic pressure.   3. Left atrial size was mildly dilated.   4. A small pericardial effusion is present. The pericardial effusion is circumferential. There is no evidence of cardiac tamponade.   5. The  mitral valve is normal in structure. Trivial mitral valve regurgitation. No evidence of mitral stenosis.   6. The aortic valve has an indeterminant number of cusps. Aortic valve regurgitation is not visualized. No aortic stenosis is present.   7. The inferior vena cava is normal in size with greater than 50% respiratory variability, suggesting right atrial pressure of 3 mmHg.   TTE, 07/10/2020  1. Left ventricular ejection fraction, by estimation, is 25 to 30%. The left ventricle has severely decreased function. The left ventricle demonstrates global hypokinesis. The left ventricular internal cavity size was moderately dilated. Left  ventricular diastolic parameters are consistent with Grade II diastolic dysfunction (pseudonormalization). The average left ventricular global longitudinal strain is -6.1 %. The global longitudinal strain is abnormal.   2. Right ventricular systolic function is normal. The right ventricular size is normal. There is moderately elevated pulmonary artery systolic pressure. The estimated right ventricular systolic pressure is 50.0 mmHg.   3. Left atrial size was mildly dilated.   4. Moderate pericardial effusion. The pericardial effusion is circumferential. There is no evidence of cardiac tamponade.   5. The mitral valve is normal in structure. Mild mitral valve regurgitation. No evidence of mitral stenosis.   6. The aortic valve is normal in structure. Aortic valve regurgitation is not visualized. Mild to moderate aortic valve sclerosis/calcification is present, without any evidence of aortic stenosis.   7. The inferior vena cava is dilated in size with <50% respiratory variability, suggesting right atrial pressure of 15 mmHg.    Assessment and Plan     #) CHB s/p PPM Device functioning well High VP with RV lead Wide paced QRS at Consider device upgrade at gen change  #) NICM #) HFrEF Warm and euvolemic on exam with good exercise capacity GDMT limited by  BP Previously elevated dig level with 0.0645mcg every other day dosing Continue 3.125mg  coreg BID, 24-26 entresto, 12.5mg  spiro Intolerant to [employer] d/t SE Update TTE in about 6 months prior to next follow-up Update BMP today         Current medicines are reviewed at length with the patient today.   The patient does not have concerns regarding her medicines.  The following changes were made today:  none  Labs/ tests ordered today include:  Orders Placed This Encounter  Procedures   Basic metabolic panel   EKG 12-Lead   ECHOCARDIOGRAM COMPLETE     Disposition: Follow up with Dr. Graciela Husbands or EP APP in 6 months with TEE prior to appt; or appt with Dr. Jimmey Ralph if Dr. Graciela Husbands has retired to discuss device upgrade   Signed, Sherie Don, NP  07/07/23  3:53 PM  Electrophysiology [facility]

## 2023-07-08 LAB — BASIC METABOLIC PANEL
BUN/Creatinine Ratio: 24 (ref 12–28)
BUN: 20 mg/dL (ref 8–27)
CO2: 24 mmol/L (ref 20–29)
Calcium: 10.4 mg/dL — ABNORMAL HIGH (ref 8.7–10.3)
Chloride: 95 mmol/L — ABNORMAL LOW (ref 96–106)
Creatinine, Ser: 0.82 mg/dL (ref 0.57–1.00)
Glucose: 99 mg/dL (ref 70–99)
Potassium: 5.1 mmol/L (ref 3.5–5.2)
Sodium: 135 mmol/L (ref 134–144)
eGFR: 79 mL/min/{1.73_m2} (ref >59)

## 2023-07-13 ENCOUNTER — Other Ambulatory Visit: Payer: Self-pay

## 2023-07-13 MED ORDER — SACUBITRIL-VALSARTAN 24-26 MG PO TABS: 1.0000 | ORAL_TABLET | Freq: Two times a day (BID) | ORAL | 0 refills | Status: DC

## 2023-07-28 ENCOUNTER — Ambulatory Visit (INDEPENDENT_AMBULATORY_CARE_PROVIDER_SITE_OTHER): Payer: PRIVATE HEALTH INSURANCE

## 2023-07-28 DIAGNOSIS — I442 Atrioventricular block, complete: Secondary | ICD-10-CM

## 2023-07-29 LAB — CUP PACEART REMOTE DEVICE CHECK
Battery Remaining Longevity: 10 mo
Battery Remaining Percentage: 9 %
Battery Voltage: 2.84 V
Brady Statistic AP VP Percent: 13 %
Brady Statistic AP VS Percent: 1 %
Brady Statistic AS VP Percent: 87 %
Brady Statistic AS VS Percent: 1 %
Brady Statistic RA Percent Paced: 12 %
Brady Statistic RV Percent Paced: 99 %
Date Time Interrogation Session: 20250415040013
Implantable Lead Connection Status: 753985
Implantable Lead Connection Status: 753985
Implantable Lead Implant Date: 20160824
Implantable Lead Implant Date: 20160824
Implantable Lead Location: 753859
Implantable Lead Location: 753860
Implantable Pulse Generator Implant Date: 20160824
Lead Channel Impedance Value: 390 Ohm
Lead Channel Impedance Value: 460 Ohm
Lead Channel Pacing Threshold Amplitude: 0.625 V
Lead Channel Pacing Threshold Amplitude: 1 V
Lead Channel Pacing Threshold Pulse Width: 0.5 ms
Lead Channel Pacing Threshold Pulse Width: 0.5 ms
Lead Channel Sensing Intrinsic Amplitude: 4.6 mV
Lead Channel Sensing Intrinsic Amplitude: 4.7 mV
Lead Channel Setting Pacing Amplitude: 1.625
Lead Channel Setting Pacing Amplitude: 2 V
Lead Channel Setting Pacing Pulse Width: 0.5 ms
Lead Channel Setting Sensing Sensitivity: 2.5 mV
Pulse Gen Model: 3222
Pulse Gen Serial Number: 7802897

## 2023-08-06 ENCOUNTER — Encounter: Payer: Self-pay | Admitting: Internal Medicine

## 2023-09-11 NOTE — Progress Notes (Signed)
 Remote pacemaker transmission.

## 2023-09-11 NOTE — Addendum Note (Signed)
 Addended by: Lott Rouleau A on: 09/11/2023 03:09 PM   Modules accepted: Orders

## 2023-09-18 DIAGNOSIS — I428 Other cardiomyopathies: Secondary | ICD-10-CM

## 2023-10-07 ENCOUNTER — Ambulatory Visit: Payer: PRIVATE HEALTH INSURANCE | Admitting: Obstetrics

## 2023-10-12 ENCOUNTER — Other Ambulatory Visit: Payer: Self-pay

## 2023-10-12 MED ORDER — SACUBITRIL-VALSARTAN 24-26 MG PO TABS: 1.0000 | ORAL_TABLET | Freq: Two times a day (BID) | ORAL | 2 refills | Status: DC

## 2023-10-27 ENCOUNTER — Ambulatory Visit: Payer: PRIVATE HEALTH INSURANCE

## 2023-10-27 DIAGNOSIS — I442 Atrioventricular block, complete: Secondary | ICD-10-CM | POA: Diagnosis not present

## 2023-10-28 LAB — CUP PACEART REMOTE DEVICE CHECK
Battery Remaining Longevity: 8 mo
Battery Remaining Percentage: 7 %
Battery Voltage: 2.81 V
Brady Statistic AP VP Percent: 9.4 %
Brady Statistic AP VS Percent: 1 %
Brady Statistic AS VP Percent: 90 %
Brady Statistic AS VS Percent: 1 %
Brady Statistic RA Percent Paced: 9.2 %
Brady Statistic RV Percent Paced: 99 %
Date Time Interrogation Session: 20250715040015
Implantable Lead Connection Status: 753985
Implantable Lead Connection Status: 753985
Implantable Lead Implant Date: 20160824
Implantable Lead Implant Date: 20160824
Implantable Lead Location: 753859
Implantable Lead Location: 753860
Implantable Pulse Generator Implant Date: 20160824
Lead Channel Impedance Value: 390 Ohm
Lead Channel Impedance Value: 450 Ohm
Lead Channel Pacing Threshold Amplitude: 0.625 V
Lead Channel Pacing Threshold Amplitude: 1.375 V
Lead Channel Pacing Threshold Pulse Width: 0.5 ms
Lead Channel Pacing Threshold Pulse Width: 0.5 ms
Lead Channel Sensing Intrinsic Amplitude: 4.8 mV
Lead Channel Sensing Intrinsic Amplitude: 5 mV
Lead Channel Setting Pacing Amplitude: 1.625
Lead Channel Setting Pacing Amplitude: 2.375
Lead Channel Setting Pacing Pulse Width: 0.5 ms
Lead Channel Setting Sensing Sensitivity: 2.5 mV
Pulse Gen Model: 3222
Pulse Gen Serial Number: 7802897

## 2023-10-31 ENCOUNTER — Ambulatory Visit: Payer: Self-pay | Admitting: Cardiology

## 2023-11-04 ENCOUNTER — Encounter: Payer: Self-pay | Admitting: Internal Medicine

## 2023-11-09 NOTE — Telephone Encounter (Signed)
 Pt is calling to check the status of this message. Please advise

## 2023-11-19 ENCOUNTER — Other Ambulatory Visit: Payer: Self-pay

## 2023-11-19 ENCOUNTER — Encounter: Payer: Self-pay | Admitting: Cardiology

## 2023-11-19 ENCOUNTER — Ambulatory Visit: Payer: PRIVATE HEALTH INSURANCE | Attending: Cardiology | Admitting: Cardiology

## 2023-11-19 VITALS — BP 120/84 | HR 98 | Ht 64.0 in | Wt 186.0 lb

## 2023-11-19 DIAGNOSIS — I442 Atrioventricular block, complete: Secondary | ICD-10-CM

## 2023-11-19 DIAGNOSIS — I428 Other cardiomyopathies: Secondary | ICD-10-CM

## 2023-11-19 DIAGNOSIS — Z950 Presence of cardiac pacemaker: Secondary | ICD-10-CM

## 2023-11-19 MED ORDER — DIPHENHYDRAMINE HCL 50 MG PO TABS: 50.0000 mg | ORAL_TABLET | Freq: Once | ORAL | 0 refills | Status: DC

## 2023-11-19 MED ORDER — CARVEDILOL 6.25 MG PO TABS: 6.2500 mg | ORAL_TABLET | Freq: Two times a day (BID) | ORAL | 3 refills | Status: DC

## 2023-11-19 MED ORDER — PREDNISONE 50 MG PO TABS: ORAL_TABLET | ORAL | 0 refills | Status: DC

## 2023-11-19 NOTE — Patient Instructions (Addendum)
 Medication Instructions:  Your physician has recommended you make the following change in your medication:   1) INCREASE Coreg  (carvedilol ) to 6.25 mg twice daily   *If you need a refill on your cardiac medications before your next appointment, please call your pharmacy*  Lab Work: BMET and CBC  Testing/Procedures: Upgrade to a CRT-D- see instruction letter  Follow-Up: At [facility], you and your health needs are our priority.  As part of our continuing mission to provide you with exceptional heart care, our providers are all part of one team.  This team includes your primary Cardiologist (physician) and Advanced Practice Providers or APPs (Physician Assistants and Nurse Practitioners) who all work together to provide you with the care you need, when you need it.  Your next appointment:   We will contact you to arrange your post-procedure appointments

## 2023-11-19 NOTE — Progress Notes (Signed)
 Electrophysiology Office Follow up Visit Note:    Date:  11/19/2023   ID:  [name] [name], DOB [birth year], MRN [id number]  PCP:  Sherial Bail, MD  [facility] Cardiologist:  Elspeth Sage, MD  [facility] Electrophysiologist:  Elspeth Sage, MD    Interval History:     [name] [name] is a 68 y.o. female who presents for a follow up visit.   The patient was previously seen by Dr. Sage.  She last saw Elvie July 07, 2023.  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  Her initial implant was in 1990 and was right sided.  This was ultimately extracted and reimplanted on the left.  There was inability to cannulate the CS.  The patient reports progressively worsening symptoms of heart failure including shortness of breath.  She also reports that her heart rates have been all over the place.  She is very eager to feel better.      Past medical, surgical, social and family history were reviewed.  ROS:   Please see the history of present illness.    All other systems reviewed and are negative.  EKGs/Labs/Other Studies Reviewed:    The following studies were reviewed today:  October 16, 2020 echo EF 25-30 Small pericardial effusion Trivial MR  July 07, 2023 EKG shows a sensed, V paced rhythm.  Paced QRS  November 19, 2023 in clinic device interrogation personally reviewed        Physical Exam:    VS:  BP 120/84 (BP Location: Right Arm, Patient Position: Sitting, Cuff Size: Normal)   Pulse 98   Ht 5' 4 (1.626 m)   Wt 186 lb (84.4 kg)   LMP  (LMP Unknown)   SpO2 95%   BMI 31.93 kg/m     Wt Readings from Last 3 Encounters:  11/19/23 186 lb (84.4 kg)  07/07/23 178 lb 2 oz (80.8 kg)  06/17/22 175 lb (79.4 kg)     GEN: no distress CARD: RRR, No MRG.  Pacemaker pocket well-healed RESP: No IWOB. CTAB.       ASSESSMENT:    No diagnosis found. PLAN:    In order of problems listed above:  #Chronic systolic heart failure #Permanent pacemaker in situ #Complete heart block NYHA class II-III.  She has a reduced EF in the setting of chronic RV pacing and a wide paced QRS.  There is inability to cannulate the CS at the time of prior implant.  I discussed treatment options today including epicardial LV lead placement versus another attempt at cannulation of the CS.  I also discussed upgrade to a defibrillator.  She only has 7 months left on her battery.  She would like to proceed with CRT-D upgrade.  She understands there is a chance I cannot cannulate the CS given Dr. Adrian difficulty in the past.  In that case, would plan for a left bundle area lead.  -----  The patient has a nonischemic CM (EF 20%), NYHA Class III CHF, and CAD.  He is referred by Dr Sage for risk stratification of sudden death and consideration of ICD implantation.  At this time, she meets criteria for ICD implantation for primary prevention of sudden death.  I have had a thorough discussion with the patient reviewing options.  The patient and their family (if available) have had opportunities to ask questions and have them answered.  The patient and I have decided together through a shared decision making process to proceed with ICD implant at this time.    Risks, benefits, alternatives to ICD implantation were discussed in detail with the patient today. The patient understands that the risks include but are not limited to bleeding, infection, pneumothorax, perforation, tamponade, vascular damage, renal failure, MI, stroke, death, inappropriate shocks, and lead dislodgement and wishes to proceed.  We will therefore schedule device implantation at the next available time.   She will hold her aspirin  for 5 days.  Signed, Ole Holts, MD, [facility], [facility] 11/19/2023 1:50 PM    Electrophysiology [facility]  [facility]

## 2023-11-19 NOTE — Addendum Note (Signed)
 Addended by: CHAUVIGNE, Kari Kerth on: 11/19/2023 02:06 PM   Modules accepted: Orders

## 2023-11-20 LAB — CBC WITH DIFFERENTIAL/PLATELET
Basophils Absolute: 0.1 x10E3/uL (ref 0.0–0.2)
Basos: 1 %
EOS (ABSOLUTE): 0.2 x10E3/uL (ref 0.0–0.4)
Eos: 2 %
Hematocrit: 43.9 % (ref 34.0–46.6)
Hemoglobin: 14.8 g/dL (ref 11.1–15.9)
Immature Grans (Abs): 0 x10E3/uL (ref 0.0–0.1)
Immature Granulocytes: 0 %
Lymphocytes Absolute: 1.8 x10E3/uL (ref 0.7–3.1)
Lymphs: 23 %
MCH: 33 pg (ref 26.6–33.0)
MCHC: 33.7 g/dL (ref 31.5–35.7)
MCV: 98 fL — ABNORMAL HIGH (ref 79–97)
Monocytes Absolute: 0.8 x10E3/uL (ref 0.1–0.9)
Monocytes: 10 %
Neutrophils Absolute: 5.2 x10E3/uL (ref 1.4–7.0)
Neutrophils: 64 %
Platelets: 205 x10E3/uL (ref 150–450)
RBC: 4.49 x10E6/uL (ref 3.77–5.28)
RDW: 13.3 % (ref 11.7–15.4)
WBC: 8.1 x10E3/uL (ref 3.4–10.8)

## 2023-11-20 LAB — BASIC METABOLIC PANEL WITH GFR
BUN/Creatinine Ratio: 16 (ref 12–28)
BUN: 15 mg/dL (ref 8–27)
CO2: 22 mmol/L (ref 20–29)
Calcium: 10 mg/dL (ref 8.7–10.3)
Chloride: 99 mmol/L (ref 96–106)
Creatinine, Ser: 0.91 mg/dL (ref 0.57–1.00)
Glucose: 86 mg/dL (ref 70–99)
Potassium: 4.9 mmol/L (ref 3.5–5.2)
Sodium: 139 mmol/L (ref 134–144)
eGFR: 70 mL/min/1.73 (ref >59)

## 2023-11-23 ENCOUNTER — Encounter: Payer: Self-pay | Admitting: Cardiology

## 2023-11-30 ENCOUNTER — Telehealth: Payer: Self-pay | Admitting: Cardiology

## 2023-11-30 ENCOUNTER — Telehealth (HOSPITAL_COMMUNITY): Payer: Self-pay

## 2023-11-30 NOTE — Telephone Encounter (Signed)
 Pt's employer is calling to get the estimated surgeon fee for procedure on 8/25.

## 2023-11-30 NOTE — Telephone Encounter (Addendum)
 Spoke with patient to discuss upcoming procedure.   Confirmed patient is scheduled for a BIV Upgrade on Monday, August 25 with Dr. Ole Holts. Instructed patient to arrive at the Main Entrance A at [facility]: [address] [place], [place] [address] and check in at Admitting at 1:00 PM.   Labs completed  Any recent signs of acute illness or been started on antibiotics?  No Any new medications started? No Any medications to hold? Hold your Aspirin  for 5 days prior to your procedure- your last dose will be August 19. Hold Spironolactone  on the morning of your procedure.  Medication instructions: Follow the instructions for your allergy prep: Take prednisone  50 mg - 13 hours prior to procedure (2:00 AM). Take another Prednisone  50 mg- 7 hours prior to procedure (8:00 AM). Bring to the hospital with your last dose of Prednisone  50 mg and Benadryl  50 mg- take 1 hour prior to procedure (2:00 PM), all with a small sip of water . On the morning of your procedure you may take all other morning medications not discussed with a small sip of water .  No eating or drinking after midnight prior to procedure.   The night before your procedure and the morning of your procedure, wash thoroughly with the CHG surgical soap from the neck down, paying special attention to the area where your procedure will be performed.  You will be staying overnight at the hospital. You MUST have a responsible adult to drive you home the following day.  Patient verbalized understanding to all instructions provided and agreed to proceed with procedure.

## 2023-12-02 NOTE — Telephone Encounter (Signed)
 Pt has come down with a cold and a 102 degree fever and is unable to make it to procedure. Please advise.

## 2023-12-02 NOTE — Telephone Encounter (Signed)
 Spoke with the patient who states that she has a cold. She reports congestion, running nose and headache. She states that she is feeling better than she did this morning but still not well. Advised patient to reach out to PCP. Procedure for device upgrade has been rescheduled.

## 2023-12-07 ENCOUNTER — Telehealth: Payer: Self-pay | Admitting: Cardiology

## 2023-12-07 NOTE — Telephone Encounter (Signed)
 Dr. Cindie, with symptomatic COVID it's typically recommended to delay for 4 weeks for elective procedures and the patient should feel recovered for about 2 weeks prior to procedure. Are you agreeable to this procedure being pushed out?

## 2023-12-07 NOTE — Telephone Encounter (Signed)
 Patient stated has been since last week and went to the doctor to be evaluated. Patient test positive for covid and started antibiotics today. Patient stated the doctor did not recommend the Paxlovid due to her being this far along with covid. Patient would like to know if she would be okay to continue with her upcoming procedure. Please advise.

## 2023-12-10 ENCOUNTER — Telehealth: Payer: Self-pay

## 2023-12-10 NOTE — Telephone Encounter (Signed)
 Called pt to inform her that due to her recently having COVID and her symptoms her CRT-D upgrade procedure with Dr. Cindie would need to be moved out further per hospital recommendations.  She informed me that she was feeling much better. She is back at work. No fever. Not much congestion or cough at all. She seen her PCP on Monday and was told her lungs were clear. Her last dose of Antibiotic will be on Friday 8/29. She is asking if she can be moved out only 2 weeks instead of 4.   I spoke with Dr. Cindie and he agreed to move her from 9/2 to 9/16 - his RN [place]) will call the pt the Friday before to assess her symptoms and if she has any cough or congestion left at that time she will have to move her procedure out further for her safety. She agreed to that and understands completely.   I will send pt updated Instruction letter via MyChart.  Pt will go to [facility] office tomorrow for updated labs.

## 2023-12-10 NOTE — Telephone Encounter (Signed)
 Pt rescheduled to 9/16. See 8/28 phone note for details.

## 2023-12-18 ENCOUNTER — Other Ambulatory Visit: Payer: Self-pay | Admitting: Emergency Medicine

## 2023-12-18 DIAGNOSIS — Z79899 Other long term (current) drug therapy: Secondary | ICD-10-CM

## 2023-12-19 ENCOUNTER — Ambulatory Visit: Payer: Self-pay | Admitting: Cardiology

## 2023-12-19 LAB — CBC
Hematocrit: 43.3 % (ref 34.0–46.6)
Hemoglobin: 14.6 g/dL (ref 11.1–15.9)
MCH: 33.1 pg — ABNORMAL HIGH (ref 26.6–33.0)
MCHC: 33.7 g/dL (ref 31.5–35.7)
MCV: 98 fL — ABNORMAL HIGH (ref 79–97)
Platelets: 240 x10E3/uL (ref 150–450)
RBC: 4.41 x10E6/uL (ref 3.77–5.28)
RDW: 13.1 % (ref 11.7–15.4)
WBC: 7.2 x10E3/uL (ref 3.4–10.8)

## 2023-12-19 LAB — BASIC METABOLIC PANEL WITH GFR
BUN/Creatinine Ratio: 19 (ref 12–28)
BUN: 17 mg/dL (ref 8–27)
CO2: 23 mmol/L (ref 20–29)
Calcium: 9.7 mg/dL (ref 8.7–10.3)
Chloride: 96 mmol/L (ref 96–106)
Creatinine, Ser: 0.91 mg/dL (ref 0.57–1.00)
Glucose: 96 mg/dL (ref 70–99)
Potassium: 5.7 mmol/L — ABNORMAL HIGH (ref 3.5–5.2)
Sodium: 133 mmol/L — ABNORMAL LOW (ref 134–144)
eGFR: 70 mL/min/1.73 (ref >59)

## 2023-12-24 ENCOUNTER — Telehealth: Payer: Self-pay

## 2023-12-24 NOTE — Telephone Encounter (Signed)
 Spoke with the patient who states that she is feeling good and has fully recovered from COVD. She denies cough, fever, runny nose, or congestion. Advised we will plan to proceed with procedure on 9/16.

## 2023-12-28 NOTE — Pre-Procedure Instructions (Signed)
 Attempted to call patient regarding procedure instructions.  Left voicemail on the following items: Arrival time 1:00 Nothing to eat or drink after midnight No meds AM of procedure Responsible person to drive you home and stay with you for 24 hrs Wash with special soap night before and morning of procedure If on anti-coagulant drug instructions ASA- last dose 9/10

## 2023-12-29 ENCOUNTER — Other Ambulatory Visit: Payer: Self-pay

## 2023-12-29 ENCOUNTER — Ambulatory Visit (HOSPITAL_COMMUNITY)
Admission: RE | Disposition: A | Discharge status: Home / Self Care | Payer: Self-pay | Source: Home / Self Care | Attending: Cardiology

## 2023-12-29 ENCOUNTER — Ambulatory Visit (HOSPITAL_COMMUNITY)
Admission: RE | Admit: 2023-12-29 | Discharge: 2023-12-30 | Disposition: A | Discharge status: Home / Self Care | Payer: PRIVATE HEALTH INSURANCE | Attending: Cardiology | Admitting: Cardiology

## 2023-12-29 ENCOUNTER — Encounter (HOSPITAL_COMMUNITY): Payer: Self-pay | Admitting: Cardiology

## 2023-12-29 DIAGNOSIS — I110 Hypertensive heart disease with heart failure: Secondary | ICD-10-CM | POA: Insufficient documentation

## 2023-12-29 DIAGNOSIS — Z45010 Encounter for checking and testing of cardiac pacemaker pulse generator [battery]: Secondary | ICD-10-CM | POA: Diagnosis not present

## 2023-12-29 DIAGNOSIS — Z95810 Presence of automatic (implantable) cardiac defibrillator: Secondary | ICD-10-CM | POA: Diagnosis present

## 2023-12-29 DIAGNOSIS — Z01818 Encounter for other preprocedural examination: Secondary | ICD-10-CM

## 2023-12-29 DIAGNOSIS — R001 Bradycardia, unspecified: Secondary | ICD-10-CM | POA: Diagnosis not present

## 2023-12-29 DIAGNOSIS — I871 Compression of vein: Secondary | ICD-10-CM | POA: Diagnosis not present

## 2023-12-29 DIAGNOSIS — I442 Atrioventricular block, complete: Secondary | ICD-10-CM | POA: Diagnosis not present

## 2023-12-29 DIAGNOSIS — I5022 Chronic systolic (congestive) heart failure: Secondary | ICD-10-CM | POA: Diagnosis not present

## 2023-12-29 DIAGNOSIS — I428 Other cardiomyopathies: Secondary | ICD-10-CM | POA: Diagnosis not present

## 2023-12-29 HISTORY — PX: LEAD INSERTION: EP1212

## 2023-12-29 HISTORY — PX: BIV UPGRADE: EP1202

## 2023-12-29 LAB — BASIC METABOLIC PANEL WITH GFR
Anion gap: 14 (ref 5–15)
BUN: 11 mg/dL (ref 8–23)
CO2: 20 mmol/L — ABNORMAL LOW (ref 22–32)
Calcium: 10 mg/dL (ref 8.9–10.3)
Chloride: 101 mmol/L (ref 98–111)
Creatinine, Ser: 0.9 mg/dL (ref 0.44–1.00)
GFR, Estimated: >60 mL/min (ref >60)
Glucose, Bld: 170 mg/dL — ABNORMAL HIGH (ref 70–99)
Potassium: 4 mmol/L (ref 3.5–5.1)
Sodium: 135 mmol/L (ref 135–145)

## 2023-12-29 MED ORDER — SODIUM CHLORIDE 0.9 % IV SOLN: INTRAVENOUS | Status: DC

## 2023-12-29 MED ORDER — LIDOCAINE HCL (PF) 1 % IJ SOLN
INTRAMUSCULAR | Status: AC
  Filled 2023-12-29: qty 60

## 2023-12-29 MED ORDER — SACUBITRIL-VALSARTAN 24-26 MG PO TABS
1.0000 | ORAL_TABLET | Freq: Two times a day (BID) | ORAL | Status: DC
  Administered 2023-12-30: 1 via ORAL
  Filled 2023-12-29: qty 1

## 2023-12-29 MED ORDER — MELATONIN 5 MG PO TABS
5.0000 mg | ORAL_TABLET | Freq: Every day | ORAL | Status: DC
  Administered 2023-12-29: 5 mg via ORAL
  Filled 2023-12-29: qty 1

## 2023-12-29 MED ORDER — ONDANSETRON HCL 4 MG/2ML IJ SOLN: 4.0000 mg | Freq: Four times a day (QID) | INTRAMUSCULAR | Status: DC | PRN

## 2023-12-29 MED ORDER — CARVEDILOL 6.25 MG PO TABS
6.2500 mg | ORAL_TABLET | Freq: Two times a day (BID) | ORAL | Status: DC
  Administered 2023-12-29 – 2023-12-30 (×2): 6.25 mg via ORAL
  Filled 2023-12-29 (×2): qty 1

## 2023-12-29 MED ORDER — LIDOCAINE HCL (PF) 1 % IJ SOLN
INTRAMUSCULAR | Status: DC | PRN
  Administered 2023-12-29: 30 mL

## 2023-12-29 MED ORDER — FENTANYL CITRATE (PF) 100 MCG/2ML IJ SOLN
INTRAMUSCULAR | Status: AC
  Filled 2023-12-29: qty 2

## 2023-12-29 MED ORDER — SPIRONOLACTONE 12.5 MG HALF TABLET
12.5000 mg | ORAL_TABLET | Freq: Every day | ORAL | Status: DC
  Administered 2023-12-30: 12.5 mg via ORAL
  Filled 2023-12-29: qty 1

## 2023-12-29 MED ORDER — CEFAZOLIN SODIUM-DEXTROSE 2-4 GM/100ML-% IV SOLN: 2.0000 g | INTRAVENOUS | Status: AC

## 2023-12-29 MED ORDER — IOHEXOL 350 MG/ML SOLN
INTRAVENOUS | Status: DC | PRN
  Administered 2023-12-29: 15 mL

## 2023-12-29 MED ORDER — SODIUM CHLORIDE 0.9 % IV SOLN: 80.0000 mg | INTRAVENOUS | Status: AC

## 2023-12-29 MED ORDER — MIDAZOLAM HCL 5 MG/5ML IJ SOLN
INTRAMUSCULAR | Status: DC | PRN
  Administered 2023-12-29 (×3): 1 mg via INTRAVENOUS

## 2023-12-29 MED ORDER — MIDAZOLAM HCL 2 MG/2ML IJ SOLN
INTRAMUSCULAR | Status: AC
  Filled 2023-12-29: qty 2

## 2023-12-29 MED ORDER — CHLORHEXIDINE GLUCONATE 4 % EX SOLN
4.0000 | Freq: Once | CUTANEOUS | Status: DC
  Filled 2023-12-29: qty 60

## 2023-12-29 MED ORDER — POVIDONE-IODINE 10 % EX SWAB
2.0000 | Freq: Once | CUTANEOUS | Status: AC
  Administered 2023-12-29: 2 via TOPICAL

## 2023-12-29 MED ORDER — ACETAMINOPHEN 325 MG PO TABS
325.0000 mg | ORAL_TABLET | ORAL | Status: DC | PRN
  Administered 2023-12-29: 650 mg via ORAL
  Filled 2023-12-29: qty 2

## 2023-12-29 MED ORDER — CEFAZOLIN SODIUM-DEXTROSE 2-4 GM/100ML-% IV SOLN
INTRAVENOUS | Status: AC
  Administered 2023-12-29: 2 g via INTRAVENOUS
  Filled 2023-12-29: qty 100

## 2023-12-29 MED ORDER — SODIUM CHLORIDE 0.9 % IV SOLN
INTRAVENOUS | Status: AC
  Administered 2023-12-29: 80 mg
  Filled 2023-12-29: qty 2

## 2023-12-29 MED ORDER — LEVOTHYROXINE SODIUM 112 MCG PO TABS
112.0000 ug | ORAL_TABLET | Freq: Every day | ORAL | Status: DC
  Administered 2023-12-30: 112 ug via ORAL
  Filled 2023-12-29: qty 1

## 2023-12-29 MED ORDER — FENTANYL CITRATE (PF) 100 MCG/2ML IJ SOLN
INTRAMUSCULAR | Status: DC | PRN
  Administered 2023-12-29 (×3): 25 ug via INTRAVENOUS

## 2023-12-29 NOTE — Plan of Care (Signed)
  Problem: Education: Goal: Knowledge of cardiac device and self-care will improve Outcome: Progressing Goal: Ability to safely manage health related needs after discharge will improve Outcome: Progressing   Problem: Cardiac: Goal: Ability to achieve and maintain adequate cardiopulmonary perfusion will improve Outcome: Progressing   Problem: Education: Goal: Knowledge of General Education information will improve Description: Including pain rating scale, medication(s)/side effects and non-pharmacologic comfort measures Outcome: Progressing   Problem: Health Behavior/Discharge Planning: Goal: Ability to manage health-related needs will improve Outcome: Progressing

## 2023-12-29 NOTE — H&P (Signed)
 Electrophysiology Office Follow up Visit Note:     Date:  12/29/2023    ID:  [name] [name], DOB [birth year], MRN [id number]   PCP:  Sherial Bail, MD   [facility] Cardiologist:  Elspeth Sage, MD  [facility] Electrophysiologist:  Elspeth Sage, MD      Interval History:       [name] [name] is a 68 y.o. female who presents for a follow up visit.    The patient was previously seen by Dr. Sage.  She last saw Elvie July 07, 2023.  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  Her initial implant was in 1990 and was right sided.  This was ultimately extracted and reimplanted on the left.  There was inability to cannulate the CS.   The patient reports progressively worsening symptoms of heart failure including shortness of breath.  She also reports that her heart rates have been all over the place.  She is very eager to feel better.  Presents for CRT-D upgrade. Procedures reviewed.      Objective Past medical, surgical, social and family history were reviewed.   ROS:   Please see the history of present illness.    All other systems reviewed and are negative.   EKGs/Labs/Other Studies Reviewed:     The following studies were reviewed today:   October 16, 2020 echo EF 25-30 Small pericardial effusion Trivial MR   July 07, 2023 EKG shows a sensed, V paced rhythm.  Paced QRS   November 19, 2023 in clinic device interrogation personally reviewed           Physical Exam:     VS:  BP 120/84 (BP Location: Right Arm, Patient Position: Sitting, Cuff Size: Normal)   Pulse 98   Ht 5' 4 (1.626 m)   Wt 186 lb (84.4 kg)   LMP  (LMP Unknown)   SpO2 95%   BMI 31.93 kg/m         Wt Readings from Last 3 Encounters:  11/19/23 186 lb (84.4 kg)  07/07/23 178 lb 2 oz (80.8 kg)  06/17/22 175 lb (79.4 kg)      GEN: no  distress CARD: RRR, No MRG.  Pacemaker pocket well-healed RESP: No IWOB. CTAB.     Assessment ASSESSMENT:     No diagnosis found. PLAN:     In order of problems listed above:   #Chronic systolic heart failure #Permanent pacemaker in situ #Complete heart block NYHA class II-III.  She has a reduced EF in the setting of chronic RV pacing and a wide paced QRS.  There is inability to cannulate the CS at the time of prior implant.   I discussed treatment options today including epicardial LV lead placement versus another attempt at cannulation of the CS.   I also discussed upgrade to a defibrillator.  She only has 7 months left on her battery.  She would like to proceed with CRT-D upgrade.  She understands there is a chance I cannot cannulate the CS given Dr. Adrian difficulty in the past.  In that case, would plan for a left bundle area lead.   -----   The patient has a nonischemic CM (EF 20%), NYHA Class III CHF, and CAD.  He is referred by Dr Sage for risk stratification of sudden death and consideration of ICD implantation.  At this time, she meets  criteria for ICD implantation for primary prevention of sudden death.  I have had a thorough discussion with the patient reviewing options.  The patient and their family (if available) have had opportunities to ask questions and have them answered. The patient and I have decided together through a shared decision making process to proceed with ICD implant at this time.     Risks, benefits, alternatives to ICD implantation were discussed in detail with the patient today. The patient understands that the risks include but are not limited to bleeding, infection, pneumothorax, perforation, tamponade, vascular damage, renal failure, MI, stroke, death, inappropriate shocks, and lead dislodgement and wishes to proceed.  We will therefore schedule device implantation at the next available time.   Presents for CRT-D upgrade, venogram. Procedure  reviewed.    Signed, Ole Holts, MD, [facility], [facility] 12/29/2023 Electrophysiology [facility]

## 2023-12-30 ENCOUNTER — Ambulatory Visit (HOSPITAL_COMMUNITY): Payer: PRIVATE HEALTH INSURANCE

## 2023-12-30 ENCOUNTER — Encounter (HOSPITAL_COMMUNITY): Payer: Self-pay | Admitting: Cardiology

## 2023-12-30 DIAGNOSIS — I428 Other cardiomyopathies: Secondary | ICD-10-CM | POA: Diagnosis not present

## 2023-12-30 MED FILL — Midazolam HCl Inj 2 MG/2ML (Base Equivalent): INTRAMUSCULAR | Qty: 3 | Status: AC

## 2023-12-30 NOTE — Plan of Care (Signed)

## 2023-12-30 NOTE — Plan of Care (Signed)

## 2023-12-30 NOTE — Discharge Summary (Signed)
 ELECTROPHYSIOLOGY PROCEDURE DISCHARGE SUMMARY    Patient ID: [name] [name],  MRN: [id number], DOB/AGE: [birth year] 68 y.o.  Admit date: 12/29/2023 Discharge date: 12/30/2023  Primary Care Physician: Sherial Bail, MD  Primary Cardiologist: Dr. Fernande (inactive) Electrophysiologist: new >> Dr. cindie  Primary Discharge Diagnosis:  NICM  Secondary Discharge Diagnosis:  CHB w/PPM PVD HTN hypothyroid  Allergies  Allergen Reactions   Marshmallow [Althaea Officinalis]     Rash, hives, itching, SOB   Meat Extract     Rash, hives itching, SOB.  Can tolerate some meats ONLY in moderation   Other     Jello - Rash, hives, itching, SOB   Contrast Media [Iodinated Contrast Media]     unknown   Latex     rash   Nickel     rash     Procedures This Admission:  1.  Upgrade PPM > ICD  Unable to cannulate CS for upgrade device DFT's were deferred at time of implant.  CONCLUSIONS:  1.  Chronic systolic heart failure, chronic RV pacing 2.  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  Unsuccessful coronary sinus and left bundle area lead implant due to #2 4.  Successful RV ICD lead implantation and generator change 5.  Plan for epicardial lead implant 6.  No early apparent complications.    CXR on 12/31/23 demonstrated no pneumothorax status post device implantation.   Brief HPI: [name] [name] is a 68 y.o. female with PMHx os above with long hx of PPM > developed NICM in setting of chronic RV pacing, recommended for upgrade to CRT-D.  The patient has persistent LV dysfunction despite guideline directed therapy.  Risks, benefits, and alternatives to planned procedure were reviewed with the patient who wished to proceed.   Hospital Course:  The patient was admitted and underwent PPM > ICD, unfortunately unable to cannulate CS lead.  (ICD lead in RV port, and old pacing lead Is in LV port) with details as outlined in the procedure report. She was  monitored on telemetry overnight which demonstrated SR/V paced rhythm.  Left chest was without hematoma or ecchymosis.  The device was interrogated and found to be functioning normally.  CXR was obtained and demonstrated no pneumothorax status post device implantation.  Wound care, arm mobility, and restrictions were reviewed with the patient.  The patient feels well, denies any CP or SOB, minimal site discomfort, she was examined by Dr. cindie and considered stable for discharge to home.   The patient's discharge medications include an ACE/ARB (entresto ) and beta blocker (carvedilol ).   I have sent a message to CTS navigator and Dr. Hiram nurse to assist in CT surgeon referral out patient for consideration of an episcardial LV lead   Physical Exam: Vitals:   12/29/23 2327 12/30/23 0420 12/30/23 0738 12/30/23 0750  BP: 113/70 116/82  108/69  Pulse: 64 68    Resp: 18 18  (!) 26  Temp: 97.8 F (36.6 C) 97.7 F (36.5 C)  97.8 F (36.6 C)  TempSrc: Oral Oral  Oral  SpO2: 97% 97% 97%   Weight:      Height:        GEN- The patient is well appearing, alert and oriented x 3 today.   HEENT: normocephalic, atraumatic; sclera clear, conjunctiva pink; hearing intact; oropharynx clear Lungs- CTA b/l, normal work of breathing.  No wheezes, rales, rhonchi Heart- RRR, no murmurs, rubs or gallops, PMI not laterally displaced GI-  soft, non-tender, non-distended Extremities- no clubbing, cyanosis, or edema MS- no significant deformity or atrophy Skin- warm and dry, no rash or lesion, left chest without hematoma/ecchymosis Psych- euthymic mood, full affect Neuro- no gross defecits  Labs:   Lab Results  Component Value Date   WBC 7.2 12/18/2023   HGB 14.6 12/18/2023   HCT 43.3 12/18/2023   MCV 98 (H) 12/18/2023   PLT 240 12/18/2023    Recent Labs  Lab 12/29/23 1359  NA 135  K 4.0  CL 101  CO2 20*  BUN 11  CREATININE 0.90  CALCIUM 10.0  GLUCOSE 170*    Discharge Medications:   Allergies as of 12/30/2023       Reactions   Marshmallow [althaea Officinalis]    Rash, hives, itching, SOB   Meat Extract    Rash, hives itching, SOB.  Can tolerate some meats ONLY in moderation   Other    Jello - Rash, hives, itching, SOB   Contrast Media [iodinated Contrast Media]    unknown   Latex    rash   Nickel    rash        Medication List     STOP taking these medications    predniSONE  50 MG tablet Commonly known as: DELTASONE        TAKE these medications    aspirin  EC 81 MG tablet Take 81 mg by mouth daily.   carvedilol  6.25 MG tablet Commonly known as: COREG  Take 1 tablet (6.25 mg total) by mouth 2 (two) times daily.   clonazePAM  0.5 MG tablet Commonly known as: KLONOPIN  TAKE 1 TABLET BY MOUTH ONCE DAILY AS NEEDED FOR ANXIETY/ SLEEP   cyanocobalamin 1000 MCG tablet Commonly known as: VITAMIN B12 Take 1,000 mcg by mouth daily.   diphenhydrAMINE  50 MG capsule Commonly known as: BENADRYL  Take 50 mg by mouth once.   folic acid 800 MCG tablet Commonly known as: FOLVITE Take 800 mcg by mouth daily.   levothyroxine  112 MCG tablet Commonly known as: SYNTHROID  Take 1 tablet by mouth once daily   lovastatin  20 MG tablet Commonly known as: MEVACOR  TAKE 1 TABLET BY MOUTH AT BEDTIME   sacubitril -valsartan  24-26 MG Commonly known as: ENTRESTO  Take 1 tablet by mouth 2 (two) times daily.   spironolactone  25 MG tablet Commonly known as: ALDACTONE  Take 12.5 mg by mouth daily.   Vitamin D3 25 MCG (1000 UT) Caps Take 1,000 Units by mouth daily.        Disposition: home Discharge Instructions     Diet - low sodium heart healthy   Complete by: As directed    Increase activity slowly   Complete by: As directed         Duration of Discharge Encounter: 15 minutes, APP time.  Bonney Charlies Arthur, PA-C 12/30/2023 9:38 AM

## 2023-12-30 NOTE — Discharge Instructions (Signed)
 After Your ICD (Implantable Cardiac Defibrillator)   You have a Abbott ICD  If you have a Medtronic or Biotronik device, plug in your home monitor once you get home, and no manual interaction is required.   If you have an Abbott or [employer] device, plug your home monitor once you get home, sit near the device, and press the large activation button. Sit nearby until the process is complete, usually notated by lights on the monitor.   If you were set up for monitoring using an app on your phone, make sure the app remains open in the background and the Bluetooth remains on.  ACTIVITY Do not lift your arm above shoulder height for 1 week after your procedure. After 7 days, you may progress as below.  You should remove your sling 24 hours after your procedure, unless otherwise instructed by your provider.     Wednesday January 06, 2024  Thursday January 07, 2024 Friday January 08, 2024 Saturday January 09, 2024   Do not lift, push, pull, or carry anything over 10 pounds with the affected arm until 6 weeks (Wednesday February 10, 2024 ) after your procedure.   You may drive AFTER your wound check, unless you have been told otherwise by your provider.   Ask your healthcare provider when you can go back to work   INCISION/Dressing If you are on a blood thinner such as Coumadin, Xarelto, Eliquis, Plavix, or Pradaxa please confirm with your provider when this should be resumed.   If large square, outer bandage is left in place, this can be removed after 24 hours from your procedure. Do not remove steri-strips or glue as below.   Monitor your defibrillator site for redness, swelling, and drainage. Call the device clinic at [phone] if you experience these symptoms or fever/chills.  If your incision is sealed with Steri-strips or staples, you may shower 7 days after your procedure or when told by your provider. Do not remove the steri-strips or let the shower hit directly on  your site. You may wash around your site with soap and water .    If you were discharged in a sling, please do not wear this during the day more than 48 hours after your surgery unless otherwise instructed. This may increase the risk of stiffness and soreness in your shoulder.   Avoid lotions, ointments, or perfumes over your incision until it is well-healed.  You may use a hot tub or a pool AFTER your wound check appointment if the incision is completely closed.  Your ICD is designed to protect you from life threatening heart rhythms. Because of this, you may receive a shock.   1 shock with no symptoms:  Call the office during business hours. 1 shock with symptoms (chest pain, chest pressure, dizziness, lightheadedness, shortness of breath, overall feeling unwell):  Call 911. If you experience 2 or more shocks in 24 hours:  Call 911. If you receive a shock, you should not drive for 6 months per the [place] DMV IF you receive appropriate therapy from your ICD.   ICD Alerts:  Some alerts are vibratory and others beep. These are NOT emergencies. Please call our office to let us  know. If this occurs at night or on weekends, it can wait until the next business day. Send a remote transmission.  If your device is capable of reading fluid status (for heart failure), you will be offered monthly monitoring to review this with you.   DEVICE MANAGEMENT Remote monitoring  is used to monitor your ICD from home. This monitoring is scheduled every 91 days by our office. It allows us  to keep an eye on the functioning of your device to ensure it is working properly. You will routinely see your Electrophysiologist annually (more often if necessary). This will appear as a REMOTE check on your MyChart schedule. These are automatic and there is nothing for you to manually do unless otherwise instructed.  You should receive your ID card for your new device in 4-8 weeks. Keep this card with you at all times once received.  Consider wearing a medical alert bracelet or necklace.  Your ICD  may be MRI compatible. This will be discussed at your next office visit/wound check.  You should avoid contact with strong electric or magnetic fields.   Do not use amateur (ham) radio equipment or electric (arc) welding torches. MP3 player headphones with magnets should not be used. Some devices are safe to use if held at least 12 inches (30 cm) from your defibrillator. These include power tools, lawn mowers, and speakers. If you are unsure if something is safe to use, ask your health care provider.  When using your cell phone, hold it to the ear that is on the opposite side from the defibrillator. Do not leave your cell phone in a pocket over the defibrillator.  You may safely use electric blankets, heating pads, computers, and microwave ovens.  Call the office right away if: You have chest pain. You feel more than one shock. You feel more short of breath than you have felt before. You feel more light-headed than you have felt before. Your incision starts to open up.  This information is not intended to replace advice given to you by your health care provider. Make sure you discuss any questions you have with your health care provider.

## 2023-12-30 NOTE — TOC CM/SW Note (Signed)
 Transition of Care [facility]) - Inpatient Brief Assessment   Patient Details  Name: [name] [name] MRN: [id number] Date of Birth: 1955/05/10  Transition [facility] CM/SW Contact:    Lauraine FORBES Saa, LCSWA Phone Number: 12/30/2023, 9:22 AM   Clinical Narrative:  9:22 AM Per chart review, patient resides at home with spouse. Patient has a PCP but does not have insurance. CSW consulted financial counseling for further assistance. Patient has SNF history with [facility]. Patient does not have HH or DME history. Patient's preferred pharmacy is [facility]. No TOC needs identified at this time. TOC will continue to follow and be available to assist.  Transition of Care Asessment: Insurance and Status: Insurance coverage has been reviewed Patient has primary care physician: Yes Home environment has been reviewed: Private Residence Prior level of function:: N/A Prior/Current Home Services: No current home services Social Drivers of Health Review: SDOH reviewed no interventions necessary Readmission risk has been reviewed: Yes (Currently Outpatient in Bed Status) Transition of care needs: no transition of care needs at this time

## 2024-01-01 ENCOUNTER — Encounter: Payer: Self-pay | Admitting: Cardiology

## 2024-01-07 ENCOUNTER — Other Ambulatory Visit: Payer: PRIVATE HEALTH INSURANCE

## 2024-01-08 ENCOUNTER — Telehealth: Payer: Self-pay

## 2024-01-08 NOTE — Telephone Encounter (Signed)
 Follow-up after same day discharge: Implant date: 12/29/2023 MD: CL Device: ICD  Location: L chest    Wound check visit: yes  90 day MD follow-up: yes  Remote Transmission received:yes  Dressing/sling removed: n/a  Confirm OAC restart on: n/a  Please continue to monitor your cardiac device site for redness, swelling, and drainage. Call the device clinic at [phone] if you experience these symptoms, fever/chills, or have questions about your device.   Remote monitoring is used to monitor your cardiac device from home. This monitoring is scheduled every 91 days by our office. It allows us  to keep an eye on the functioning of your device to ensure it is working properly.  Unable to reach pt

## 2024-01-12 ENCOUNTER — Ambulatory Visit: Payer: PRIVATE HEALTH INSURANCE | Attending: Cardiology

## 2024-01-12 DIAGNOSIS — I428 Other cardiomyopathies: Secondary | ICD-10-CM

## 2024-01-12 DIAGNOSIS — I442 Atrioventricular block, complete: Secondary | ICD-10-CM | POA: Diagnosis not present

## 2024-01-12 LAB — CUP PACEART INCLINIC DEVICE CHECK
Battery Remaining Longevity: 63 mo
Brady Statistic RA Percent Paced: 2 %
Brady Statistic RV Percent Paced: 99 %
Date Time Interrogation Session: 20250930122950
HighPow Impedance: 67.5 Ohm
Implantable Lead Connection Status: 753985
Implantable Lead Connection Status: 753985
Implantable Lead Connection Status: 753985
Implantable Lead Implant Date: 20160824
Implantable Lead Implant Date: 20160824
Implantable Lead Implant Date: 20250916
Implantable Lead Location: 753859
Implantable Lead Location: 753860
Implantable Lead Location: 753860
Implantable Pulse Generator Implant Date: 20250916
Lead Channel Impedance Value: 362.5 Ohm
Lead Channel Impedance Value: 437.5 Ohm
Lead Channel Impedance Value: 500 Ohm
Lead Channel Pacing Threshold Amplitude: 0.75 V
Lead Channel Pacing Threshold Amplitude: 0.75 V
Lead Channel Pacing Threshold Amplitude: 0.75 V
Lead Channel Pacing Threshold Amplitude: 0.75 V
Lead Channel Pacing Threshold Amplitude: 1.25 V
Lead Channel Pacing Threshold Amplitude: 1.25 V
Lead Channel Pacing Threshold Pulse Width: 0.5 ms
Lead Channel Pacing Threshold Pulse Width: 0.5 ms
Lead Channel Pacing Threshold Pulse Width: 0.5 ms
Lead Channel Pacing Threshold Pulse Width: 0.5 ms
Lead Channel Pacing Threshold Pulse Width: 0.5 ms
Lead Channel Pacing Threshold Pulse Width: 0.5 ms
Lead Channel Sensing Intrinsic Amplitude: 4.4 mV
Lead Channel Setting Pacing Amplitude: 1.75 V
Lead Channel Setting Pacing Amplitude: 2.5 V
Lead Channel Setting Pacing Amplitude: 3.5 V
Lead Channel Setting Pacing Pulse Width: 0.5 ms
Lead Channel Setting Pacing Pulse Width: 0.5 ms
Lead Channel Setting Sensing Sensitivity: 0.5 mV
Pulse Gen Serial Number: 111082542
Zone Setting Status: 755011

## 2024-01-12 NOTE — Progress Notes (Signed)
 Normal multi chamber ICD wound check. Wound well healed. Presenting rhythm: AS/BP 95 . Routine testing performed. Thresholds, sensing, and impedance consistent with implant measurements with 3.5V safety margin/auto capture until 3 month visit. No treated arrhythmias. Reviewed arm restrictions to continue for 6 weeks total post op. Reviewed shock plan.  Pt enrolled in remote follow-up.

## 2024-01-12 NOTE — Patient Instructions (Signed)

## 2024-01-20 ENCOUNTER — Encounter: Payer: Self-pay | Admitting: Surgery

## 2024-01-20 ENCOUNTER — Ambulatory Visit: Payer: PRIVATE HEALTH INSURANCE | Attending: Surgery | Admitting: Surgery

## 2024-01-20 ENCOUNTER — Encounter: Payer: Self-pay | Admitting: *Deleted

## 2024-01-20 ENCOUNTER — Other Ambulatory Visit: Payer: Self-pay | Admitting: *Deleted

## 2024-01-20 VITALS — BP 102/70 | HR 95 | Resp 18 | Ht 64.0 in

## 2024-01-20 DIAGNOSIS — I442 Atrioventricular block, complete: Secondary | ICD-10-CM

## 2024-01-20 NOTE — Progress Notes (Unsigned)
 [address], [name] [address]             [phone]     Cardiothoracic Surgery Consultation  PCP is Sherial Bail, MD Referring Provider is Cindie Ole DASEN, MD  Chief Complaint  Patient presents with  . Consult    HPI:   Past Medical History:  Diagnosis Date  . Anxiety    takes Klonopin  daily as needed  . Arthritis   . Back pain    reason unknown  . Chronic systolic CHF (congestive heart failure) (HCC)    a. Echo 10/2013: EF 40-45 (apical images 35-40%); b. Echo 12/2014: EF 35-40%; c. 06/2020 Echo: EF 25-30%, glob HK, Gr2 DD, RVSP ~ ; d. 10/2020 Echo: EF 25-30%, glob HK. Nl RV fxn, nl PASP, mildly dil LA. Small peric eff, triv MR.  . Complete AV block (HCC)    a. s/p pacemaker insertion 1996; b. s/p contralateral side [employer] Jude generator change 11/2014.  . Dizziness    occasionally  . GERD (gastroesophageal reflux disease)    takes Zantac  daily as needed  . Gout   . Headache    occasionally  . History of blood clots    in heart-was on Coumadin---this many yrs ago  . History of colon polyps    benign  . Hyperlipidemia    takes Lovastatin  daily  . Hypertension    hx of-since hip replacement MD took resident off meds 10/30/14 b/c well controlled  . Hypothyroid    takes Synthroid  daily  . Joint pain   . Joint swelling   . Moderate Pericardial effusion    a. 07/2020 Echo: Mod effusion w/o tamponade; b. 10/2020 Echo: small peric eff w/o tamponade.  SABRA NICM (nonischemic cardiomyopathy) (HCC)    a. tachycardia induced->EF 40-45% (2010), 35-40% (10/2013), 35-40% Gr1 DD, mod TR (12/2014);  b. 01/2015 Cath: Nl cors, EF 30-35%, glob HK; c. 06/2020 Echo: EF 25-30%; d. 10/2020 Echo: EF 25-30%.  . Numbness and tingling    left leg and right arm  . Ovarian cancer (HCC)    ovarian, skin   . 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 Peripheral artery disease    stent  on lt illiac    Past Surgical History:  Procedure Laterality Date  . ABDOMINAL HYSTERECTOMY    . ANGIOPLASTY / STENTING ILIAC    . BIV UPGRADE N/A 12/29/2023   Procedure: BIV UPGRADE;  Surgeon: Cindie Ole DASEN, MD;  Location: [facility] INVASIVE CV LAB;  Service: Cardiovascular;  Laterality: N/A;  . CARDIAC CATHETERIZATION N/A 02/08/2015   Procedure: Left Heart Cath and Coronary Angiography;  Surgeon: Deatrice DELENA Cage, MD;  Location: [facility] INVASIVE CV LAB;  Service: Cardiovascular;  Laterality: N/A;  . CERVICAL CERCLAGE  28yrs ago  . COLONOSCOPY    . DG BARIUM SWALLOW ([facility] HX)    . DILATION AND CURETTAGE OF UTERUS    . EP IMPLANTABLE DEVICE Left 12/06/2014   Procedure: Implantation of cardiac resynchronization therapy pacemaker;  Surgeon: Danelle LELON Birmingham, MD;  Location: [facility] OR;  Service: Cardiovascular;  Laterality: Left;  . HERNIA MESH REMOVAL Right    incisional  . HERNIA REPAIR    . ILIAC ARTERY STENT  Apr 05, 2010   Left artery stenting  . INSERT / REPLACE / REMOVE PACEMAKER    . JOINT REPLACEMENT  October 31, 2014   hip  . KNEE SURGERY  Right   . LEAD INSERTION N/A 12/29/2023   Procedure: LEAD INSERTION;  Surgeon: Cindie Ole DASEN, MD;  Location: [facility] INVASIVE CV LAB;  Service: Cardiovascular;  Laterality: N/A;  . PACEMAKER INSERTION  2012   Had replaced in July 2012, first placed in 1996  . PACEMAKER LEAD REMOVAL Right 12/06/2014   Procedure: REMOVAL OF RV AND RA PACEMAKER LEADS;  Surgeon: Danelle LELON Birmingham, MD;  Location: [facility] OR;  Service: Cardiovascular;  Laterality: Right;  DR. Libbie Bartley TO BACK UP   . PACEMAKER PLACEMENT Left 12/06/14  . TOTAL ABDOMINAL HYSTERECTOMY W/ BILATERAL SALPINGOOPHORECTOMY  07/2008   Ovarian cancer  . TOTAL HIP ARTHROPLASTY Right 10/31/2014   Procedure: TOTAL HIP ARTHROPLASTY ANTERIOR APPROACH;  Surgeon: Ozell Flake, MD;  Location: [facility] ORS;  Service: Orthopedics;  Laterality: Right;    Family History  Problem Relation Age of Onset  . Hyperlipidemia Mother   .  Polymyalgia rheumatica Mother   . Cancer Father        died of lung cancer  . Lung cancer Father   . Breast cancer Sister   . Breast cancer Paternal Aunt   . Diabetes Brother   . Arrhythmia Son     Social History Social History   Tobacco Use  . Smoking status: Former    Current packs/day: 0.00    Types: Cigarettes    Quit date: 07/17/2008    Years since quitting: 15.5  . Smokeless tobacco: Never  Vaping Use  . Vaping status: Never Used  Substance Use Topics  . Alcohol use: Not Currently    Comment: rum daily-previously drank beer daily  . Drug use: No    Current Outpatient Medications  Medication Sig Dispense Refill  . aspirin  EC 81 MG tablet Take 81 mg by mouth daily.    . carvedilol  (COREG ) 6.25 MG tablet Take 1 tablet (6.25 mg total) by mouth 2 (two) times daily. 180 tablet 3  . Cholecalciferol (VITAMIN D3) 25 MCG (1000 UT) CAPS Take 1,000 Units by mouth daily.    . clonazePAM  (KLONOPIN ) 0.5 MG tablet TAKE 1 TABLET BY MOUTH ONCE DAILY AS NEEDED FOR ANXIETY/ SLEEP 30 tablet 0  . cyanocobalamin (VITAMIN B12) 1000 MCG tablet Take 1,000 mcg by mouth daily.    . levothyroxine  (SYNTHROID ) 112 MCG tablet Take 1 tablet by mouth once daily 90 tablet 1  . lovastatin  (MEVACOR ) 20 MG tablet TAKE 1 TABLET BY MOUTH AT BEDTIME 90 tablet 0  . sacubitril -valsartan  (ENTRESTO ) 24-26 MG Take 1 tablet by mouth 2 (two) times daily. 180 tablet 2  . spironolactone  (ALDACTONE ) 25 MG tablet Take 12.5 mg by mouth daily.     No current facility-administered medications for this visit.    Allergies  Allergen Reactions  . Marshmallow [Althaea Officinalis]     Rash, hives, itching, SOB  . Meat Extract     Rash, hives itching, SOB.  Can tolerate some meats ONLY in moderation  . Other     Jello - Rash, hives, itching, SOB  . Contrast Media [Iodinated Contrast Media]     unknown  . Latex     rash  . Nickel     rash    Review of Systems  BP 102/70   Pulse 95   Resp 18   Ht 5' 4 (1.626  m)   LMP  (LMP Unknown)   SpO2 95%   BMI 31.71 kg/m  Physical Exam   Diagnostic Tests:   Impression:   Plan:   [name] [name]  [name], MD [facility] [phone]

## 2024-01-20 NOTE — Progress Notes (Signed)
 Remote PPM Transmission

## 2024-01-26 ENCOUNTER — Ambulatory Visit

## 2024-01-28 ENCOUNTER — Telehealth: Payer: Self-pay

## 2024-01-28 NOTE — Telephone Encounter (Signed)
 FMLA form completed and fax to [phone]/ [facility]/HR Dept./ Beginning LOA 02/05/24 through 04/04/24. DOS 02/05/24

## 2024-02-02 ENCOUNTER — Encounter (HOSPITAL_COMMUNITY): Payer: Self-pay

## 2024-02-02 ENCOUNTER — Encounter: Payer: Self-pay | Admitting: Cardiology

## 2024-02-02 NOTE — Pre-Procedure Instructions (Signed)
 Surgical Instructions   Your procedure is scheduled on February 05, 2024. Report to [facility] Main Entrance A at 5:30 A.M., then check in with the Admitting office. Any questions or running late day of surgery: call [phone]  Questions prior to your surgery date: call [phone], Monday-Friday, 8am-4pm. If you experience any cold or flu symptoms such as cough, fever, chills, shortness of breath, etc. between now and your scheduled surgery, please notify us  at the above number.     Remember:  Do not eat or drink after midnight the night before your surgery   Take these medicines the morning of surgery with A SIP OF WATER : carvedilol  (COREG ) levothyroxine  (SYNTHROID )     May take these medicines IF NEEDED: clonazePAM  (KLONOPIN )    STOP taking your sacubitril -valsartan  (ENTRESTO ) three days prior to surgery. Your last dose will be October 20th.  Continue taking your Aspirin  through the day before surgery. DO NOT take any the morning of surgery.   One week prior to surgery, STOP taking any Aleve, Naproxen, Ibuprofen , Motrin , Advil , Goody's, BC's, all herbal medications, fish oil, and non-prescription vitamins.                     Do NOT Smoke (Tobacco/Vaping) for 24 hours prior to your procedure.  If you use a CPAP at night, you may bring your mask/headgear for your overnight stay.   You will be asked to remove any contacts, glasses, piercing's, hearing aid's, dentures/partials prior to surgery. Please bring cases for these items if needed.    Patients discharged the day of surgery will not be allowed to drive home, and someone needs to stay with them for 24 hours.  SURGICAL WAITING ROOM VISITATION Patients may have no more than 2 support people in the waiting area - these visitors may rotate.   Pre-op nurse will coordinate an appropriate time for 1 ADULT support person, who may not rotate, to accompany patient in pre-op.  Children under the age of 45 must have an adult  with them who is not the patient and must remain in the main waiting area with an adult.  If the patient needs to stay at the hospital during part of their recovery, the visitor guidelines for inpatient rooms apply.  Please refer to the [facility] website for the visitor guidelines for any additional information.   If you received a COVID test during your pre-op visit  it is requested that you wear a mask when out in public, stay away from anyone that may not be feeling well and notify your surgeon if you develop symptoms. If you have been in contact with anyone that has tested positive in the last 10 days please notify you surgeon.      Pre-operative CHG Bathing Instructions   You can play a key role in reducing the risk of infection after surgery. Your skin needs to be as free of germs as possible. You can reduce the number of germs on your skin by washing with CHG (chlorhexidine  gluconate) soap before surgery. CHG is an antiseptic soap that kills germs and continues to kill germs even after washing.   DO NOT use if you have an allergy to chlorhexidine /CHG or antibacterial soaps. If your skin becomes reddened or irritated, stop using the CHG and notify one of our RNs at [phone].              TAKE A SHOWER THE NIGHT BEFORE SURGERY   Please keep in mind  the following:  DO NOT shave, including legs and underarms, 48 hours prior to surgery.   You may shave your face before/day of surgery.  Place clean sheets on your bed the night before surgery Use a clean washcloth (not used since being washed) for shower. DO NOT sleep with pet's night before surgery.  CHG Shower Instructions:  Wash your face and private area with normal soap. If you choose to wash your hair, wash first with your normal shampoo.  After you use shampoo/soap, rinse your hair and body thoroughly to remove shampoo/soap residue.  Turn the water  OFF and apply half the bottle of CHG soap to a CLEAN washcloth.  Apply CHG  soap ONLY FROM YOUR NECK DOWN TO YOUR TOES (washing for 3-5 minutes)  DO NOT use CHG soap on face, private areas, open wounds, or sores.  Pay special attention to the area where your surgery is being performed.  If you are having back surgery, having someone wash your back for you may be helpful. Wait 2 minutes after CHG soap is applied, then you may rinse off the CHG soap.  Pat dry with a clean towel  Put on clean pajamas    Additional instructions for the day of surgery: If you choose, you may shower the morning of surgery with an antibacterial soap.  DO NOT APPLY any lotions, deodorants, cologne, or perfumes.   Do not wear jewelry or makeup Do not wear nail polish, gel polish, artificial nails, or any other type of covering on natural nails (fingers and toes) Do not bring valuables to the hospital. [facility] is not responsible for valuables/personal belongings. Put on clean/comfortable clothes.  Please brush your teeth.  Ask your nurse before applying any prescription medications to the skin.

## 2024-02-02 NOTE — Progress Notes (Signed)
 PERIOPERATIVE PRESCRIPTION FOR IMPLANTED CARDIAC DEVICE PROGRAMMING  Patient Information: Name:  Natara D Rowand  DOB:  11/16/1955  MRN:  [id number]   Planned Procedure:  Left Thoracotomy and Insertion of Left Ventricular Epicardial Pacing Lead  Surgeon:  Dr. Dorise Fellers  Date of Procedure:  02/05/2024  Cautery will be used.  Position during surgery:  Supine   Device Information:  Clinic EP Physician:  Dr. Cindie  Device Type:  Defibrillator Manufacturer and Phone #:  [employer]: [phone] Pacemaker Dependent?:  Yes.   Date of Last Device Check:  01/12/2024  Normal Device Function?:  Yes.    Electrophysiologist's Recommendations:  Have magnet available. Provide continuous ECG monitoring when magnet is used or reprogramming is to be performed.  Procedure will likely interfere with device function.  Device should be programmed:  Tachy therapies disabled and Asynchronous pacing during procedure and returned to normal programming after procedure  Per Device Clinic Standing Orders, Rozelle JONELLE Banter, RN  11:36 AM 02/02/2024

## 2024-02-03 ENCOUNTER — Other Ambulatory Visit: Payer: Self-pay

## 2024-02-03 ENCOUNTER — Ambulatory Visit (HOSPITAL_COMMUNITY)
Admission: RE | Admit: 2024-02-03 | Discharge: 2024-02-03 | Disposition: A | Discharge status: Home / Self Care | Payer: PRIVATE HEALTH INSURANCE | Source: Ambulatory Visit | Attending: Surgery | Admitting: Surgery

## 2024-02-03 ENCOUNTER — Encounter (HOSPITAL_COMMUNITY)
Admission: RE | Admit: 2024-02-03 | Discharge: 2024-02-03 | Disposition: A | Discharge status: Home / Self Care | Payer: PRIVATE HEALTH INSURANCE | Source: Ambulatory Visit | Attending: Surgery | Admitting: Surgery

## 2024-02-03 ENCOUNTER — Encounter (HOSPITAL_COMMUNITY): Payer: Self-pay

## 2024-02-03 VITALS — BP 123/84 | HR 88 | Temp 98.4°F | Resp 16 | Ht 64.0 in | Wt 187.0 lb

## 2024-02-03 DIAGNOSIS — I442 Atrioventricular block, complete: Secondary | ICD-10-CM | POA: Insufficient documentation

## 2024-02-03 DIAGNOSIS — Z01818 Encounter for other preprocedural examination: Secondary | ICD-10-CM | POA: Insufficient documentation

## 2024-02-03 HISTORY — DX: Presence of automatic (implantable) cardiac defibrillator: Z95.810

## 2024-02-03 LAB — COMPREHENSIVE METABOLIC PANEL WITH GFR
ALT: 14 U/L (ref 0–44)
AST: 18 U/L (ref 15–41)
Albumin: 4 g/dL (ref 3.5–5.0)
Alkaline Phosphatase: 76 U/L (ref 38–126)
Anion gap: 7 (ref 5–15)
BUN: 14 mg/dL (ref 8–23)
CO2: 27 mmol/L (ref 22–32)
Calcium: 9.5 mg/dL (ref 8.9–10.3)
Chloride: 98 mmol/L (ref 98–111)
Creatinine, Ser: 1 mg/dL (ref 0.44–1.00)
GFR, Estimated: >60 mL/min (ref >60)
Glucose, Bld: 92 mg/dL (ref 70–99)
Potassium: 4.3 mmol/L (ref 3.5–5.1)
Sodium: 132 mmol/L — ABNORMAL LOW (ref 135–145)
Total Bilirubin: 0.6 mg/dL (ref 0.0–1.2)
Total Protein: 7.3 g/dL (ref 6.5–8.1)

## 2024-02-03 LAB — PROTIME-INR
INR: 1 (ref 0.8–1.2)
Prothrombin Time: 13.6 s (ref 11.4–15.2)

## 2024-02-03 LAB — TYPE AND SCREEN
ABO/RH(D): O POS
Antibody Screen: NEGATIVE

## 2024-02-03 LAB — URINALYSIS, ROUTINE W REFLEX MICROSCOPIC
Bilirubin Urine: NEGATIVE
Glucose, UA: NEGATIVE mg/dL
Hgb urine dipstick: NEGATIVE
Ketones, ur: NEGATIVE mg/dL
Leukocytes,Ua: NEGATIVE
Nitrite: NEGATIVE
Protein, ur: NEGATIVE mg/dL
Specific Gravity, Urine: 1.004 — ABNORMAL LOW (ref 1.005–1.030)
pH: 7 (ref 5.0–8.0)

## 2024-02-03 LAB — CBC
HCT: 42.8 % (ref 36.0–46.0)
Hemoglobin: 14.5 g/dL (ref 12.0–15.0)
MCH: 32.3 pg (ref 26.0–34.0)
MCHC: 33.9 g/dL (ref 30.0–36.0)
MCV: 95.3 fL (ref 80.0–100.0)
Platelets: 189 K/uL (ref 150–400)
RBC: 4.49 MIL/uL (ref 3.87–5.11)
RDW: 13.1 % (ref 11.5–15.5)
WBC: 6.1 K/uL (ref 4.0–10.5)
nRBC: 0 % (ref 0.0–0.2)

## 2024-02-03 LAB — SURGICAL PCR SCREEN
MRSA, PCR: NEGATIVE
Staphylococcus aureus: NEGATIVE

## 2024-02-03 LAB — APTT: aPTT: 27 s (ref 24–36)

## 2024-02-03 NOTE — Progress Notes (Signed)
 PCP - Dr. Lavenia Beaver Cardiologist - Dr. Ole Holts, LOV 11/19/2023  PPM/ICD - ICD, [employer] Device Orders - received and on chart Rep Notified - Brian Small  Chest x-ray - PAT, 02/03/2024 EKG - PAT, 02/03/2024 Stress Test - 10/20/2014 ECHO - 10/13/2020 Cardiac Cath - 02/08/2015  Sleep Study - denies CPAP - na  Non-diabetic  Blood Thinner Instructions: denies Aspirin  Instructions: Hold day of surgery  ERAS Protcol - NPO  Anesthesia review: Yes. CHF, HTN, iliac stent,   Patient denies shortness of breath, fever, cough and chest pain at PAT appointment   All instructions explained to the patient, with a verbal understanding of the material. Patient agrees to go over the instructions while at home for a better understanding. Patient also instructed to self quarantine after being tested for COVID-19. The opportunity to ask questions was provided.

## 2024-02-04 NOTE — H&P (Signed)
 [address] [name] [address]             [phone]      Cardiothoracic Surgery Admission History and Physical  PCP is Sherial Bail, MD Referring Provider is Cindie Ole DASEN, MD   Chief Complaint   Chronic systolic heart failure   HPI:   The patient is a 68 year old woman with a history of hypertension, hypothyroidism, hyperlipidemia, complete heart block status post permanent pacemaker placement on the right side in 1990 and subsequent extraction and reimplantation on the left with inability to cannulate the coronary sinus, nonischemic cardiomyopathy with normal coronaries on cath in 2016 and echo in 10/2020 showing ejection fraction of of 25 to 30% and recent echo at [facility] showing ejection fraction of 20% who presents with worsening shortness of breath and fatigue due to congestive heart failure.  She underwent CRT- D upgrade by Dr. 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  An RV ICD lead was implanted and the generator change.  The previous RV lead was connected to the system to maintain MRI compatibility pending epicardial LV lead implant.     Past Medical History: Diagnosis Date  Anxiety     takes Klonopin  daily as needed  Arthritis    Back pain     reason unknown  Chronic systolic CHF (congestive heart failure) (HCC)     a. Echo 10/2013: EF 40-45 (apical images 35-40%); b. Echo 12/2014: EF 35-40%; c. 06/2020 Echo: EF 25-30%, glob HK, Gr2 DD, RVSP ~ ; d. 10/2020 Echo: EF 25-30%, glob HK. Nl RV fxn, nl PASP, mildly dil LA. Small peric eff, triv MR.  Complete AV block (HCC)     a. s/p pacemaker insertion 1996; b. s/p contralateral side St. Jude generator change 11/2014.  Dizziness     occasionally  GERD (gastroesophageal reflux disease)     takes Zantac  daily as needed  Gout     Headache     occasionally  History of blood clots     in heart-was on Coumadin---this many yrs ago  History of colon polyps     benign  Hyperlipidemia     takes Lovastatin  daily  Hypertension     hx of-since hip replacement MD took resident off meds 10/30/14 b/c well controlled  Hypothyroid     takes Synthroid  daily  Joint pain    Joint swelling    Moderate Pericardial effusion     a. 07/2020 Echo: Mod effusion w/o tamponade; b. 10/2020 Echo: small peric eff w/o tamponade.  NICM (nonischemic cardiomyopathy) (HCC)     a. tachycardia induced->EF 40-45% (2010), 35-40% (10/2013), 35-40% Gr1 DD, mod TR (12/2014);  b. 01/2015 Cath: Nl cors, EF 30-35%, glob HK; c. 06/2020 Echo: EF 25-30%; d. 10/2020 Echo: EF 25-30%.  Numbness and tingling     left leg and right arm  Ovarian cancer (HCC)     ovarian, skin   Pacemaker lead failure--noise on atrial greater than ventricular lead with ventricular backup pulse pacing 10/17/2014  Peripheral artery disease     stent on lt illiac        Past Surgical History: Procedure Laterality Date  ABDOMINAL HYSTERECTOMY      ANGIOPLASTY / STENTING ILIAC      BIV UPGRADE N/A 12/29/2023   Procedure: BIV UPGRADE;  Surgeon: Cindie Ole DASEN, MD;  Location: [facility] INVASIVE CV LAB;  Service: Cardiovascular;  Laterality: N/A;  CARDIAC CATHETERIZATION N/A 02/08/2015   Procedure: Left Heart Cath and Coronary Angiography;  Surgeon: Deatrice DELENA Cage, MD;  Location: [facility] INVASIVE CV LAB;  Service: Cardiovascular;  Laterality: N/A;  CERVICAL CERCLAGE   61yrs ago  COLONOSCOPY      DG BARIUM SWALLOW ([facility] HX)      DILATION AND CURETTAGE OF UTERUS      EP IMPLANTABLE DEVICE Left 12/06/2014   Procedure: Implantation of cardiac resynchronization therapy pacemaker;  Surgeon: Danelle LELON Birmingham, MD;  Location: [facility] OR;  Service: Cardiovascular;  Laterality: Left;  HERNIA MESH REMOVAL Right      incisional  HERNIA REPAIR      ILIAC ARTERY STENT   Apr 05, 2010   Left artery stenting  INSERT / REPLACE / REMOVE PACEMAKER      JOINT REPLACEMENT   October 31, 2014   hip  KNEE SURGERY Right    LEAD INSERTION N/A 12/29/2023   Procedure: LEAD INSERTION;  Surgeon: Cindie Ole DASEN, MD;  Location: [facility] INVASIVE CV LAB;  Service: Cardiovascular;  Laterality: N/A;  PACEMAKER INSERTION   2012   Had replaced in July 2012, first placed in 1996  PACEMAKER LEAD REMOVAL Right 12/06/2014   Procedure: REMOVAL OF RV AND RA PACEMAKER LEADS;  Surgeon: Danelle LELON Birmingham, MD;  Location: [facility] OR;  Service: Cardiovascular;  Laterality: Right;  DR. Gotham Raden TO BACK UP   PACEMAKER PLACEMENT Left 12/06/14  TOTAL ABDOMINAL HYSTERECTOMY W/ BILATERAL SALPINGOOPHORECTOMY   07/2008   Ovarian cancer  TOTAL HIP ARTHROPLASTY Right 10/31/2014   Procedure: TOTAL HIP ARTHROPLASTY ANTERIOR APPROACH;  Surgeon: Ozell Flake, MD;  Location: [facility] ORS;  Service: Orthopedics;  Laterality: Right;        Family History Problem Relation Age of Onset  Hyperlipidemia Mother    Polymyalgia rheumatica Mother    Cancer Father         died of lung cancer  Lung cancer Father    Breast cancer Sister    Breast cancer Paternal Aunt    Diabetes Brother    Arrhythmia Son          Social History Social History  Social History   Tobacco Use  Smoking status: Former     Current packs/day: 0.00     Types: Cigarettes     Quit date: 07/17/2008     Years since quitting: 15.5  Smokeless tobacco: Never Vaping Use  Vaping status: Never Used Substance Use Topics  Alcohol use: Not Currently     Comment: rum daily-previously drank beer daily  Drug use: No       Current Outpatient Medications Medication Sig Dispense Refill  aspirin  EC 81 MG tablet Take 81 mg by mouth daily.      carvedilol  (COREG ) 6.25 MG tablet Take 1 tablet (6.25 mg  total) by mouth 2 (two) times daily. 180 tablet 3  Cholecalciferol (VITAMIN D3) 25 MCG (1000 UT) CAPS Take 1,000 Units by mouth daily.      clonazePAM  (KLONOPIN ) 0.5 MG tablet TAKE 1 TABLET BY MOUTH ONCE DAILY AS NEEDED FOR ANXIETY/ SLEEP 30 tablet 0  cyanocobalamin (VITAMIN B12) 1000 MCG tablet Take 1,000 mcg by mouth daily.      levothyroxine  (SYNTHROID ) 112 MCG tablet Take 1 tablet by mouth once daily 90 tablet 1  lovastatin  (MEVACOR ) 20 MG tablet TAKE 1 TABLET BY MOUTH AT BEDTIME 90 tablet 0  sacubitril -valsartan  (ENTRESTO )  24-26 MG Take 1 tablet by mouth 2 (two) times daily. 180 tablet 2  spironolactone  (ALDACTONE ) 25 MG tablet Take 12.5 mg by mouth daily.        No current facility-administered medications for this visit.       Allergies  Allergies Allergen Reactions  Marshmallow [Althaea Officinalis]       Rash, hives, itching, SOB  Meat Extract       Rash, hives itching, SOB.  Can tolerate some meats ONLY in moderation  Other       Jello - Rash, hives, itching, SOB  Contrast Media [Iodinated Contrast Media]       unknown  Latex       rash  Nickel       rash       Review of Systems  Constitutional:  Positive for fatigue and unexpected weight change.  HENT: Negative.    Eyes: Negative.   Respiratory:  Positive for cough and shortness of breath.   Cardiovascular:  Positive for chest pain. Negative for palpitations and leg swelling.  Gastrointestinal:  Positive for abdominal pain.  Endocrine: Negative.   Genitourinary:  Positive for frequency.  Musculoskeletal:  Positive for arthralgias.  Skin: Negative.   Allergic/Immunologic: Negative.   Neurological:  Negative for dizziness and syncope.  Hematological: Negative.   Psychiatric/Behavioral: Negative.      BP 102/70   Pulse 95   Resp 18   Ht 5' 4 (1.626 m)   LMP  (LMP Unknown)   SpO2 95%   BMI 31.71 kg/m  Physical Exam Constitutional:       Appearance: Normal appearance.  HENT:     Head: Normocephalic and atraumatic.  Eyes:     Extraocular Movements: Extraocular movements intact.     Conjunctiva/sclera: Conjunctivae normal.     Pupils: Pupils are equal, round, and reactive to light.  Neck:     Vascular: No carotid bruit.  Cardiovascular:     Rate and Rhythm: Normal rate and regular rhythm.     Pulses: Normal pulses.     Heart sounds: Normal heart sounds. No murmur heard. Pulmonary:     Effort: Pulmonary effort is normal.     Breath sounds: Normal breath sounds.  Abdominal:     General: There is no distension.     Tenderness: There is no abdominal tenderness.  Musculoskeletal:        General: No swelling.  Skin:    General: Skin is warm and dry.     Comments: Left chest Pacer/ICD incision healing well.  Neurological:     General: No focal deficit present.     Mental Status: She is alert and oriented to person, place, and time.  Psychiatric:        Mood and Affect: Mood normal.        Behavior: Behavior normal.       Diagnostic Tests:      [facility] SYSTEM                         [place]  [facility] CLINIC INTERNAL MEDICINE                     [id number]                                                                                DOB: 07/22/1955  Age: 67                   ECHO-DOPPLER REPORT                             Date: 11/02/2023                                                                                 Female                                                                                     Outpatient                                                                       LOCATION: [facility]                                                                                MD1: RADHIKA KALISETTI            STUDY: ECHO COMPLETE                             SOUND QLTY: Moderate                        ECHO: Yes  STRAIN: Yes                          COLOR: Yes                                               3D: No                          DOPPLER: Yes                                               BP: 116 / 68                  RV BIOPSY: No                                                HR: 88 BPM                     CONTRAST: No                                            Height: 64 in                       MEDIUM: N/A                                           Weight: 183 lbs                    MACHINE: [facility] - EpiQ-1                         BSA: 1.9                    ------------------------------------------------------------------------------------------     History: CHF      Reason: LV function   Indication: I50.22- Chronic systolic (congestive) heart failure (CMS/HHS-HCC).                   I42.9- Cardiomyopathy, unspecified (CMS/HHS-HCC).                                    CONCLUSION ------------------------------------------------------------------------------- SEVERE LEFT VENTRICULAR SYSTOLIC DYSFUNCTION WITH NO LVH ESTIMATED EF: 20%, CALC EF(2D): 16% DIASTOLIC FUNCTION CAN'T BE DETERMINED NORMAL RIGHT VENTRICULAR SYSTOLIC FUNCTION VALVULAR REGURGITATION: TRIVIAL AR, TRIVIAL MR, TRIVIAL PR, TRIVIAL TR ESTIMATED RVSP: 37 mmHg (Normal) NO VALVULAR STENOSIS SMALL PERICARDIAL EFFUSION    ECHOCARDIOGRAPHIC DESCRIPTIONS ----------------------------------------------------------- AORTIC ROOT         Asc Ao Size: Normal  Dissection: INDETERMINATE FOR DISSECTION                                              AORTIC VALVE            Leaflets: Tricuspid                               Mobility: Fully Mobile                    Morphology: Normal                                                                                    AR: TRIVIAL AR                                    AS: No AS                                AV Mass: No Masses                    LEFT VENTRICLE                Size: Normal                                                                                   LVH: None                                 Contraction: SEVERE DECREASE                      Closest EF: 20%                                     Calc. EF: 16%(2D)                         LV GLS (TT): -5.1%                                                                       Strain Analysis: GLS > -16%  Global longitudinal strain is abnormal.                                 LV Mass: No Masses                         Dias. FxClass: can't be determined                                                        WALL MOTION                            Basal             Mid               Apical               Anterior Septum: Hypokinetic       Hypokinetic       Hypokinetic           Anterior Wall: Hypokinetic       Hypokinetic       Hypokinetic            Lateral Wall: Normal            Normal            Hypokinetic          Posterior Wall: Hypokinetic       Hypokinetic                             Inferior Wall: Hypokinetic       Hypokinetic       Hypokinetic         Inferior Septum: Hypokinetic       Hypokinetic                        Rest Rest Score Index: 1.88   MITRAL VALVE            Leaflets: Normal                                  Mobility: Fully Mobile                    Morphology: Normal                                                                                      MR: TRIVIAL MR                                    MS: No MS  MV masses: No Masses                    LEFT ATRIUM                Size: Normal                                                                             LA masses: No Masses                    MAIN PA                Size: Not Seen                      PULMONIC VALVE            Leaflets: UNKNOWN                                 Mobility: Fully  Mobile                    Morphology: Normal                                                            PR: TRIVIAL PR                                    PS: No PS                             PV masses: No Masses                    RIGHT VENTRICLE                Size: Normal                                 Free Wall: Normal                         Contraction: Normal                                                     RV masses: No Masses                    TRICUSPID VALVE            Leaflets: Normal  Mobility: Fully Mobile                    Morphology: Normal                                        TR: TRIVIAL TR                                    TS: No TS                             TV masses: No Masses                    RIGHT ATRIUM                Size: SMALL                                 RA masses: No Masses                    PERICARDIUM               Fluid: MILD EFFUSION                                      INFERIOR VENA CAVA                Size: Normal                                  Max Diam: 1.3 cm                                  Min Diam: 0.7 cm                      Percent Change: 46 %                                                                       Resp.Collapse: ABNORMAL RESPIRATORY COLLAPSE                                          RESTING ECHOCARDIOGRAPHIC MEASUREMENTS --------------------------------------------------- AORTA Measurements            Values    Units     Normal Range                                Annulus: 2.6       cm        [1.9 - 2.7]  Asc.Aorta: 3.3       cm        [1.9 - 3.5]                           Asc. Aorta BSA: 1.7       cm/m2     [1.0 - 2.2]                    LEFT VENTRICLE                  LVIDd: 5.6       cm        [3.8 - 5.2]                                    LVIDs: 5         cm        [2.2 - 3.5]                                LVIDd/BSA: 3         cm/m2                                                       SWT: 0.7       cm        [0.6 - 0.9]                                      PWT: 0.9       cm        [0.6 - 0.9]                             LV EF MOD BP: 16        %                                                LV EDV MOD BP: 247       mL                                                     LVEDVi: 131       mL/m2     [29 - 61]                              LV ESV MOD BP: 207       mL  LVESVi: 110       mL/m2     [8 - 24]                      DIASTOLIC FUNCTION          MV Lat E'Vel.: 15.3      cm/s                                    LEFT ATRIUM                LA Diam: 4.9       cm        [2.7 - 3.8]                    RIGHT ATRIUM                RA Area: 6.7       cm2       [ <= 20]                                  RA Volume: 11        mL                                                       RAVi: 6         mL/m2     [15 - 27]                      INFERIOR VENA CAVA                Max.IVC: 1.3       cm        [ <= 2.1]                                    Min.IVC: 0.7       cm        [ <= 1.7]                             Percent Change: 46        %                                        Pressures, Gradients, and DOPPLER ECHO ---------------------------------------------------    Tricuspid Regurgitation Values            TR Pk. Vel.: 2.7       m/s                                                RA Pressure: 8         mmHg  RVSP: 37        mmHg      Peak                                 Perform By: Alexandra Moan, RCS                                                       Res. Person: Alexandra Moan, RCS                                                  Electronically signed by Oneil Pinal, M.D. on:11/03/2023 6:27:40 PM with status of Final  The images are stored in the [facility] system, please contact the clinical provider for images related to  this study. Procedure Note   Pinal Oneil Novel, MD - 11/03/2023 Formatting of this note might be different from the original.               [facility] SYSTEM                         [place]                                                                     [facility] CLINIC INTERNAL MEDICINE                     [id number]                                                                DOB: [birth year]  Age: 35                   ECHO-DOPPLER REPORT                             Date: 11/02/2023                                                                                  Female Outpatient  LOCATION: [facility]                                                                  MD1: RADHIKA KALISETTI            STUDY: ECHO COMPLETE                             SOUND QLTY: Moderate                       ECHO: Yes                                           STRAIN: Yes            COLOR: Yes                                               3D: No          DOPPLER: Yes                                               BP: 116 / 68                  RV BIOPSY: No                                                HR: 88 BPM     CONTRAST: No                                            Height: 64 in         MEDIUM: N/A                                           Weight: 183 lbs                    MACHINE: [facility] - EpiQ-1                         BSA: 1.9      ------------------------------------------------------------------------------------------     History: CHF      Reason: LV function   Indication: I50.22- Chronic systolic (congestive) heart failure (CMS/HHS-HCC).         I42.9- Cardiomyopathy, unspecified (CMS/HHS-HCC).  CONCLUSION ------------------------------------------------------------------------------- SEVERE LEFT VENTRICULAR SYSTOLIC DYSFUNCTION WITH NO LVH ESTIMATED EF:  20%, CALC EF(2D): 16% DIASTOLIC FUNCTION CAN'T BE DETERMINED NORMAL RIGHT VENTRICULAR SYSTOLIC FUNCTION VALVULAR REGURGITATION: TRIVIAL AR, TRIVIAL MR, TRIVIAL PR, TRIVIAL TR ESTIMATED RVSP: 37 mmHg (Normal) NO VALVULAR STENOSIS SMALL PERICARDIAL EFFUSION    ECHOCARDIOGRAPHIC DESCRIPTIONS ----------------------------------------------------------- AORTIC ROOT         Asc Ao Size: Normal                                Dissection: INDETERMINATE FOR DISSECTION                            AORTIC VALVE            Leaflets: Tricuspid                               Mobility: Fully Mobile                    Morphology: Normal                                                                  AR: TRIVIAL AR                                    AS: No AS                               AV Mass: No Masses                    LEFT VENTRICLE                Size: Normal                                                                 LVH: None                                 Contraction: SEVERE DECREASE                      Closest EF: 20%                                     Calc. EF: 16%(2D)                         LV GLS (TT): -5.1%  Strain Analysis: GLS > -16% Global longitudinal strain is abnormal.                 LV Mass: No Masses                         Dias. FxClass: can't be determined                                     WALL MOTION                            Basal             Mid               Apical           Anterior Septum: Hypokinetic       Hypokinetic       Hypokinetic           Anterior Wall: Hypokinetic       Hypokinetic       Hypokinetic            Lateral Wall: Normal            Normal            Hypokinetic          Posterior Wall: Hypokinetic       Hypokinetic                             Inferior Wall: Hypokinetic       Hypokinetic       Hypokinetic         Inferior Septum: Hypokinetic       Hypokinetic                         Rest Rest Score Index: 1.88   MITRAL VALVE            Leaflets: Normal                                  Mobility: Fully Mobile                    Morphology: Normal                                                                 MR: TRIVIAL MR                                    MS: No MS                             MV masses: No Masses                    LEFT ATRIUM  Size: Normal                                                           LA masses: No Masses                    MAIN PA                Size: Not Seen                      PULMONIC VALVE            Leaflets: UNKNOWN                                 Mobility: Fully Mobile                    Morphology: Normal                                                            PR: TRIVIAL PR                                    PS: No PS                             PV masses: No Masses                    RIGHT VENTRICLE                Size: Normal                                 Free Wall: Normal                         Contraction: Normal                                                     RV masses: No Masses                    TRICUSPID VALVE            Leaflets: Normal                                  Mobility: Fully Mobile                    Morphology: Normal  TR: TRIVIAL TR                                    TS: No TS                             TV masses: No Masses                    RIGHT ATRIUM                Size: SMALL                                 RA masses: No Masses                    PERICARDIUM               Fluid: MILD EFFUSION                                      INFERIOR VENA CAVA                Size: Normal                                  Max Diam: 1.3 cm                                  Min Diam: 0.7 cm                      Percent Change: 46 %                                                         Resp.Collapse: ABNORMAL RESPIRATORY COLLAPSE                            RESTING ECHOCARDIOGRAPHIC MEASUREMENTS --------------------------------------------------- AORTA Measurements            Values    Units     Normal Range                                Annulus: 2.6       cm        [1.9 - 2.7]                               Asc.Aorta: 3.3       cm        [1.9 - 3.5]                           Asc. Aorta BSA: 1.7       cm/m2     [1.0 - 2.2]  LEFT VENTRICLE                  LVIDd: 5.6       cm        [3.8 - 5.2]                                    LVIDs: 5         cm        [2.2 - 3.5]                                LVIDd/BSA: 3         cm/m2                                                      SWT: 0.7       cm        [0.6 - 0.9]                                      PWT: 0.9       cm        [0.6 - 0.9]                             LV EF MOD BP: 16        %                                                LV EDV MOD BP: 247       mL                                                     LVEDVi: 131       mL/m2     [29 - 61]                              LV ESV MOD BP: 207       mL                                                     LVESVi: 110       mL/m2     [8 - 24]                      DIASTOLIC FUNCTION          MV Lat E'Vel.: 15.3      cm/s  LEFT ATRIUM                LA Diam: 4.9       cm        [2.7 - 3.8]                    RIGHT ATRIUM                RA Area: 6.7       cm2       [ <= 20]                                  RA Volume: 11        mL                                                        RAVi: 6         mL/m2     [15 - 27]                      INFERIOR VENA CAVA                Max.IVC: 1.3       cm        [ <= 2.1]                                    Min.IVC: 0.7       cm        [ <= 1.7]                            Percent Change: 46        %                                        Pressures, Gradients, and DOPPLER ECHO  ---------------------------------------------------    Tricuspid Regurgitation Values            TR Pk. Vel.: 2.7       m/s                                                RA Pressure: 8         mmHg                                                     RVSP: 37        mmHg      Peak                                 Perform By: Alexandra Moan, RCS  Res. Person: Alexandra Moan, RCS                                      Electronically signed by Oneil Pinal, M.D. on:11/03/2023 6:27:40 PM with status of Final     Impression:   This 68 year old woman has severe nonischemic cardiomyopathy with ejection fraction of 20% and NYHA class III congestive heart failure symptoms.  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  I agree that the next best option is to proceed with placement of a LV epicardial pacing lead through a small left thoracotomy with connection to the recently implanted left chest wall generator.  Her RV pace/sense lead will be capped when the epicardial LV lead is implanted. I discussed the operative procedure with the patient including alternatives, benefits and risks; including but not limited to bleeding, blood transfusion, infection, epicardial lead malfunction, organ dysfunction, and death.  I also discussed the possibility that this may not improve her symptoms.  Jullian D Welford understands and agrees to proceed.    Plan:   LV epicardial lead placement via left thoracotomy.     Dorise [name], MD [facility] [phone]

## 2024-02-05 ENCOUNTER — Inpatient Hospital Stay (HOSPITAL_COMMUNITY): Payer: PRIVATE HEALTH INSURANCE

## 2024-02-05 ENCOUNTER — Encounter (HOSPITAL_COMMUNITY): Admission: RE | Disposition: A | Payer: Self-pay | Source: Home / Self Care | Attending: Surgery

## 2024-02-05 ENCOUNTER — Encounter (HOSPITAL_COMMUNITY): Payer: Self-pay | Admitting: Surgery

## 2024-02-05 ENCOUNTER — Other Ambulatory Visit: Payer: Self-pay

## 2024-02-05 ENCOUNTER — Inpatient Hospital Stay (HOSPITAL_COMMUNITY)
Admission: RE | Admit: 2024-02-05 | Discharge: 2024-02-08 | DRG: 261 | Disposition: A | Discharge status: Home / Self Care | Payer: PRIVATE HEALTH INSURANCE | Attending: Surgery | Admitting: Surgery

## 2024-02-05 DIAGNOSIS — Z90710 Acquired absence of both cervix and uterus: Secondary | ICD-10-CM | POA: Diagnosis not present

## 2024-02-05 DIAGNOSIS — I3139 Other pericardial effusion (noninflammatory): Secondary | ICD-10-CM | POA: Diagnosis present

## 2024-02-05 DIAGNOSIS — M549 Dorsalgia, unspecified: Secondary | ICD-10-CM | POA: Diagnosis present

## 2024-02-05 DIAGNOSIS — I428 Other cardiomyopathies: Secondary | ICD-10-CM | POA: Diagnosis present

## 2024-02-05 DIAGNOSIS — I442 Atrioventricular block, complete: Principal | ICD-10-CM | POA: Diagnosis present

## 2024-02-05 DIAGNOSIS — M542 Cervicalgia: Secondary | ICD-10-CM | POA: Diagnosis present

## 2024-02-05 DIAGNOSIS — Z860100 Personal history of colon polyps, unspecified: Secondary | ICD-10-CM

## 2024-02-05 DIAGNOSIS — Z96641 Presence of right artificial hip joint: Secondary | ICD-10-CM | POA: Diagnosis present

## 2024-02-05 DIAGNOSIS — I5022 Chronic systolic (congestive) heart failure: Secondary | ICD-10-CM | POA: Diagnosis present

## 2024-02-05 DIAGNOSIS — I739 Peripheral vascular disease, unspecified: Secondary | ICD-10-CM | POA: Diagnosis present

## 2024-02-05 DIAGNOSIS — Z87891 Personal history of nicotine dependence: Secondary | ICD-10-CM | POA: Diagnosis not present

## 2024-02-05 DIAGNOSIS — Z8543 Personal history of malignant neoplasm of ovary: Secondary | ICD-10-CM | POA: Diagnosis not present

## 2024-02-05 DIAGNOSIS — E782 Mixed hyperlipidemia: Secondary | ICD-10-CM

## 2024-02-05 DIAGNOSIS — I083 Combined rheumatic disorders of mitral, aortic and tricuspid valves: Secondary | ICD-10-CM | POA: Diagnosis present

## 2024-02-05 DIAGNOSIS — R5383 Other fatigue: Secondary | ICD-10-CM | POA: Diagnosis present

## 2024-02-05 DIAGNOSIS — K219 Gastro-esophageal reflux disease without esophagitis: Secondary | ICD-10-CM | POA: Diagnosis present

## 2024-02-05 DIAGNOSIS — I110 Hypertensive heart disease with heart failure: Secondary | ICD-10-CM | POA: Diagnosis present

## 2024-02-05 DIAGNOSIS — I509 Heart failure, unspecified: Secondary | ICD-10-CM | POA: Diagnosis present

## 2024-02-05 DIAGNOSIS — Z86718 Personal history of other venous thrombosis and embolism: Secondary | ICD-10-CM | POA: Diagnosis not present

## 2024-02-05 DIAGNOSIS — E785 Hyperlipidemia, unspecified: Secondary | ICD-10-CM | POA: Diagnosis present

## 2024-02-05 DIAGNOSIS — Z9889 Other specified postprocedural states: Principal | ICD-10-CM

## 2024-02-05 DIAGNOSIS — E7800 Pure hypercholesterolemia, unspecified: Secondary | ICD-10-CM | POA: Diagnosis not present

## 2024-02-05 DIAGNOSIS — F419 Anxiety disorder, unspecified: Secondary | ICD-10-CM | POA: Diagnosis present

## 2024-02-05 DIAGNOSIS — Z950 Presence of cardiac pacemaker: Secondary | ICD-10-CM

## 2024-02-05 DIAGNOSIS — M109 Gout, unspecified: Secondary | ICD-10-CM | POA: Diagnosis present

## 2024-02-05 DIAGNOSIS — E039 Hypothyroidism, unspecified: Secondary | ICD-10-CM | POA: Diagnosis present

## 2024-02-05 DIAGNOSIS — Z833 Family history of diabetes mellitus: Secondary | ICD-10-CM

## 2024-02-05 HISTORY — PX: EPICARDIAL PACING LEAD PLACEMENT: SHX6274

## 2024-02-05 HISTORY — PX: THORACOTOMY: SHX5074

## 2024-02-05 SURGERY — THORACOTOMY, MAJOR
Anesthesia: General | Site: Chest

## 2024-02-05 MED ORDER — SPIRONOLACTONE 12.5 MG HALF TABLET
12.5000 mg | ORAL_TABLET | Freq: Every day | ORAL | Status: DC
  Administered 2024-02-06 – 2024-02-08 (×3): 12.5 mg via ORAL
  Filled 2024-02-05 (×3): qty 1

## 2024-02-05 MED ORDER — BUPIVACAINE HCL (PF) 0.5 % IJ SOLN
INTRAMUSCULAR | Status: AC
  Filled 2024-02-05: qty 30

## 2024-02-05 MED ORDER — SACUBITRIL-VALSARTAN 24-26 MG PO TABS
1.0000 | ORAL_TABLET | Freq: Two times a day (BID) | ORAL | Status: DC
  Administered 2024-02-06: 1 via ORAL
  Filled 2024-02-05 (×2): qty 1

## 2024-02-05 MED ORDER — 0.9 % SODIUM CHLORIDE (POUR BTL) OPTIME
TOPICAL | Status: DC | PRN
  Administered 2024-02-05: 2000 mL

## 2024-02-05 MED ORDER — ROCURONIUM BROMIDE 10 MG/ML (PF) SYRINGE
PREFILLED_SYRINGE | INTRAVENOUS | Status: DC | PRN
Start: 2024-02-05 — End: 2024-02-05
  Administered 2024-02-05: 20 mg via INTRAVENOUS
  Administered 2024-02-05: 80 mg via INTRAVENOUS

## 2024-02-05 MED ORDER — ENOXAPARIN SODIUM 40 MG/0.4ML IJ SOSY
40.0000 mg | PREFILLED_SYRINGE | Freq: Every day | INTRAMUSCULAR | Status: DC
Start: 2024-02-05 — End: 2024-02-08
  Administered 2024-02-05 – 2024-02-07 (×3): 40 mg via SUBCUTANEOUS
  Filled 2024-02-05 (×3): qty 0.4

## 2024-02-05 MED ORDER — CEFAZOLIN SODIUM-DEXTROSE 2-4 GM/100ML-% IV SOLN
2.0000 g | Freq: Three times a day (TID) | INTRAVENOUS | Status: AC
  Administered 2024-02-05 (×2): 2 g via INTRAVENOUS
  Filled 2024-02-05 (×2): qty 100

## 2024-02-05 MED ORDER — MORPHINE SULFATE (PF) 2 MG/ML IV SOLN
2.0000 mg | INTRAVENOUS | Status: DC | PRN
  Administered 2024-02-05 (×2): 2 mg via INTRAVENOUS
  Filled 2024-02-05 (×2): qty 1

## 2024-02-05 MED ORDER — LACTATED RINGERS IV SOLN: INTRAVENOUS | Status: DC

## 2024-02-05 MED ORDER — BISACODYL 5 MG PO TBEC
10.0000 mg | DELAYED_RELEASE_TABLET | Freq: Every day | ORAL | Status: DC
  Administered 2024-02-05 – 2024-02-07 (×3): 10 mg via ORAL
  Filled 2024-02-05 (×2): qty 2

## 2024-02-05 MED ORDER — LIDOCAINE 2% (20 MG/ML) 5 ML SYRINGE
INTRAMUSCULAR | Status: DC | PRN
  Administered 2024-02-05: 100 mg via INTRAVENOUS

## 2024-02-05 MED ORDER — LIDOCAINE 2% (20 MG/ML) 5 ML SYRINGE
INTRAMUSCULAR | Status: AC
  Filled 2024-02-05: qty 5

## 2024-02-05 MED ORDER — PROPOFOL 10 MG/ML IV BOLUS
INTRAVENOUS | Status: AC
  Filled 2024-02-05: qty 20

## 2024-02-05 MED ORDER — TRAMADOL HCL 50 MG PO TABS: 50.0000 mg | ORAL_TABLET | Freq: Four times a day (QID) | ORAL | Status: DC | PRN

## 2024-02-05 MED ORDER — CLONAZEPAM 0.5 MG PO TABS
0.5000 mg | ORAL_TABLET | Freq: Every day | ORAL | Status: DC | PRN
  Administered 2024-02-05 – 2024-02-07 (×3): 0.5 mg via ORAL
  Filled 2024-02-05 (×3): qty 1

## 2024-02-05 MED ORDER — ASPIRIN 81 MG PO TBEC
81.0000 mg | DELAYED_RELEASE_TABLET | Freq: Every day | ORAL | Status: DC
  Administered 2024-02-06 – 2024-02-08 (×3): 81 mg via ORAL
  Filled 2024-02-05 (×3): qty 1

## 2024-02-05 MED ORDER — FENTANYL CITRATE (PF) 100 MCG/2ML IJ SOLN
INTRAMUSCULAR | Status: AC
  Filled 2024-02-05: qty 2

## 2024-02-05 MED ORDER — ROCURONIUM BROMIDE 10 MG/ML (PF) SYRINGE
PREFILLED_SYRINGE | INTRAVENOUS | Status: AC
Start: 2024-02-05 — End: 2024-02-05
  Filled 2024-02-05: qty 10

## 2024-02-05 MED ORDER — BUPIVACAINE LIPOSOME 1.3 % IJ SUSP
INTRAMUSCULAR | Status: AC
Start: 2024-02-05 — End: 2024-02-05
  Filled 2024-02-05: qty 20

## 2024-02-05 MED ORDER — PANTOPRAZOLE SODIUM 40 MG PO TBEC: 40.0000 mg | DELAYED_RELEASE_TABLET | Freq: Every day | ORAL | Status: DC

## 2024-02-05 MED ORDER — DEXAMETHASONE SOD PHOSPHATE PF 10 MG/ML IJ SOLN
INTRAMUSCULAR | Status: DC | PRN
  Administered 2024-02-05: 10 mg via INTRAVENOUS

## 2024-02-05 MED ORDER — ONDANSETRON HCL 4 MG/2ML IJ SOLN: 4.0000 mg | Freq: Four times a day (QID) | INTRAMUSCULAR | Status: DC | PRN

## 2024-02-05 MED ORDER — SODIUM CHLORIDE (PF) 0.9 % IJ SOLN
INTRAMUSCULAR | Status: AC
  Filled 2024-02-05: qty 50

## 2024-02-05 MED ORDER — LEVOTHYROXINE SODIUM 112 MCG PO TABS
112.0000 ug | ORAL_TABLET | Freq: Every day | ORAL | Status: DC
  Administered 2024-02-06 – 2024-02-08 (×3): 112 ug via ORAL
  Filled 2024-02-05 (×3): qty 1

## 2024-02-05 MED ORDER — CLEVIDIPINE BUTYRATE 0.5 MG/ML IV EMUL
INTRAVENOUS | Status: AC
Start: 2024-02-05 — End: 2024-02-05
  Filled 2024-02-05: qty 50

## 2024-02-05 MED ORDER — MIDAZOLAM HCL 2 MG/2ML IJ SOLN
INTRAMUSCULAR | Status: AC
  Filled 2024-02-05: qty 2

## 2024-02-05 MED ORDER — METHOCARBAMOL 500 MG PO TABS
1000.0000 mg | ORAL_TABLET | Freq: Four times a day (QID) | ORAL | Status: DC | PRN
  Administered 2024-02-05 – 2024-02-06 (×3): 1000 mg via ORAL
  Filled 2024-02-05 (×3): qty 2

## 2024-02-05 MED ORDER — CEFAZOLIN SODIUM-DEXTROSE 2-4 GM/100ML-% IV SOLN
INTRAVENOUS | Status: AC
  Filled 2024-02-05: qty 100

## 2024-02-05 MED ORDER — ONDANSETRON HCL 4 MG/2ML IJ SOLN
INTRAMUSCULAR | Status: DC | PRN
  Administered 2024-02-05: 4 mg via INTRAVENOUS

## 2024-02-05 MED ORDER — CALCIUM CARBONATE ANTACID 500 MG PO CHEW
2.0000 | CHEWABLE_TABLET | Freq: Two times a day (BID) | ORAL | Status: DC | PRN
  Administered 2024-02-05 – 2024-02-06 (×2): 400 mg via ORAL
  Filled 2024-02-05 (×2): qty 2

## 2024-02-05 MED ORDER — CHLORHEXIDINE GLUCONATE 0.12 % MT SOLN
OROMUCOSAL | Status: AC
  Administered 2024-02-05: 15 mL via OROMUCOSAL
  Filled 2024-02-05: qty 15

## 2024-02-05 MED ORDER — SUGAMMADEX SODIUM 200 MG/2ML IV SOLN
INTRAVENOUS | Status: DC | PRN
  Administered 2024-02-05 (×2): 100 mg via INTRAVENOUS

## 2024-02-05 MED ORDER — CHLORHEXIDINE GLUCONATE 0.12 % MT SOLN: 15.0000 mL | Freq: Once | OROMUCOSAL | Status: AC

## 2024-02-05 MED ORDER — ACETAMINOPHEN 160 MG/5ML PO SOLN: 1000.0000 mg | Freq: Four times a day (QID) | ORAL | Status: DC

## 2024-02-05 MED ORDER — CARVEDILOL 6.25 MG PO TABS
6.2500 mg | ORAL_TABLET | Freq: Two times a day (BID) | ORAL | Status: DC
  Administered 2024-02-05 – 2024-02-06 (×2): 6.25 mg via ORAL
  Filled 2024-02-05 (×3): qty 1

## 2024-02-05 MED ORDER — CEFAZOLIN SODIUM-DEXTROSE 2-4 GM/100ML-% IV SOLN
2.0000 g | INTRAVENOUS | Status: AC
  Administered 2024-02-05: 2 g via INTRAVENOUS

## 2024-02-05 MED ORDER — ACETAMINOPHEN 10 MG/ML IV SOLN: 1000.0000 mg | Freq: Once | INTRAVENOUS | Status: DC | PRN

## 2024-02-05 MED ORDER — PANTOPRAZOLE SODIUM 40 MG PO TBEC
40.0000 mg | DELAYED_RELEASE_TABLET | Freq: Every day | ORAL | Status: DC
  Administered 2024-02-05 – 2024-02-08 (×4): 40 mg via ORAL
  Filled 2024-02-05 (×4): qty 1

## 2024-02-05 MED ORDER — OXYCODONE HCL 5 MG PO TABS: 5.0000 mg | ORAL_TABLET | Freq: Once | ORAL | Status: DC | PRN

## 2024-02-05 MED ORDER — FENTANYL CITRATE (PF) 250 MCG/5ML IJ SOLN
INTRAMUSCULAR | Status: DC | PRN
  Administered 2024-02-05: 50 ug via INTRAVENOUS
  Administered 2024-02-05 (×4): 25 ug via INTRAVENOUS
  Administered 2024-02-05: 50 ug via INTRAVENOUS

## 2024-02-05 MED ORDER — SODIUM CHLORIDE FLUSH 0.9 % IV SOLN
INTRAVENOUS | Status: DC | PRN
  Administered 2024-02-05: 40 mL

## 2024-02-05 MED ORDER — OXYCODONE HCL 5 MG PO TABS
5.0000 mg | ORAL_TABLET | ORAL | Status: DC | PRN
  Administered 2024-02-05 (×3): 5 mg via ORAL
  Filled 2024-02-05 (×3): qty 1

## 2024-02-05 MED ORDER — ORAL CARE MOUTH RINSE: 15.0000 mL | Freq: Once | OROMUCOSAL | Status: AC

## 2024-02-05 MED ORDER — OXYCODONE HCL 5 MG/5ML PO SOLN: 5.0000 mg | Freq: Once | ORAL | Status: DC | PRN

## 2024-02-05 MED ORDER — MIDAZOLAM HCL (PF) 2 MG/2ML IJ SOLN
INTRAMUSCULAR | Status: DC | PRN
  Administered 2024-02-05: 1 mg via INTRAVENOUS

## 2024-02-05 MED ORDER — ONDANSETRON HCL 4 MG/2ML IJ SOLN
INTRAMUSCULAR | Status: AC
Start: 2024-02-05 — End: 2024-02-05
  Filled 2024-02-05: qty 2

## 2024-02-05 MED ORDER — FENTANYL CITRATE (PF) 100 MCG/2ML IJ SOLN: 25.0000 ug | INTRAMUSCULAR | Status: DC | PRN

## 2024-02-05 MED ORDER — SENNOSIDES-DOCUSATE SODIUM 8.6-50 MG PO TABS
1.0000 | ORAL_TABLET | Freq: Every day | ORAL | Status: DC
  Administered 2024-02-05 – 2024-02-06 (×2): 1 via ORAL
  Filled 2024-02-05 (×2): qty 1

## 2024-02-05 MED ORDER — ACETAMINOPHEN 500 MG PO TABS
1000.0000 mg | ORAL_TABLET | Freq: Four times a day (QID) | ORAL | Status: DC
  Administered 2024-02-05 – 2024-02-08 (×12): 1000 mg via ORAL
  Filled 2024-02-05 (×12): qty 2

## 2024-02-05 MED ORDER — PROPOFOL 10 MG/ML IV BOLUS
INTRAVENOUS | Status: DC | PRN
Start: 2024-02-05 — End: 2024-02-05
  Administered 2024-02-05: 190 mg via INTRAVENOUS

## 2024-02-05 SURGICAL SUPPLY — 64 items
BLADE CLIPPER SURG (BLADE) ×2 IMPLANT
BLADE STERNUM SYSTEM 6 (BLADE) IMPLANT
CABLE PACEMAKER EXTEN (MISCELLANEOUS) IMPLANT
CANISTER SUCTION 3000ML PPV (SUCTIONS) ×4 IMPLANT
CAP LEAD ASSY DEFIB 4033A (Neurosurgery Supplies) IMPLANT
CATH KIT ON-Q SILVERSOAK 5 (CATHETERS) IMPLANT
CATH THORACIC 28FR (CATHETERS) ×2 IMPLANT
CATH THORACIC 36FR (CATHETERS) IMPLANT
CATH THORACIC 36FR RT ANG (CATHETERS) IMPLANT
CLIP TI MEDIUM 6 (CLIP) ×2 IMPLANT
CLIP TI WIDE RED SMALL 6 (CLIP) ×2 IMPLANT
CONN ST 1/4X3/8 BEN (MISCELLANEOUS) IMPLANT
CONN Y 3/8X3/8X3/8 BEN (MISCELLANEOUS) IMPLANT
COVER SURGICAL LIGHT HANDLE (MISCELLANEOUS) ×2 IMPLANT
DERMABOND ADVANCED .7 DNX12 (GAUZE/BANDAGES/DRESSINGS) IMPLANT
DRAIN CHANNEL 28F RND 3/8 FF (WOUND CARE) IMPLANT
DRAPE C-ARM 42X72 X-RAY (DRAPES) IMPLANT
DRAPE HALF SHEET 40X57 (DRAPES) ×4 IMPLANT
DRAPE INCISE IOBAN 66X45 STRL (DRAPES) ×2 IMPLANT
DRAPE LAPAROSCOPIC ABDOMINAL (DRAPES) ×2 IMPLANT
DRSG COVADERM 4X8 (GAUZE/BANDAGES/DRESSINGS) IMPLANT
ELECTRODE REM PT RTRN 9FT ADLT (ELECTROSURGICAL) ×2 IMPLANT
FELT TEFLON 1X6 (MISCELLANEOUS) IMPLANT
GAUZE 4X4 16PLY ~~LOC~~+RFID DBL (SPONGE) ×2 IMPLANT
GAUZE SPONGE 4X4 12PLY STRL (GAUZE/BANDAGES/DRESSINGS) ×2 IMPLANT
GLOVE SURG MICRO LTX SZ7 (GLOVE) ×4 IMPLANT
GOWN STRL REUS W/ TWL LRG LVL3 (GOWN DISPOSABLE) ×4 IMPLANT
GOWN STRL REUS W/ TWL XL LVL3 (GOWN DISPOSABLE) ×2 IMPLANT
IMPL BIOMEC 54 ~~LOC~~ (Pacemaker) IMPLANT
KIT BASIN OR (CUSTOM PROCEDURE TRAY) ×2 IMPLANT
KIT TURNOVER KIT B (KITS) ×2 IMPLANT
KIT WRENCH PACEMAKER ASSEM (MISCELLANEOUS) IMPLANT
PACK CHEST (CUSTOM PROCEDURE TRAY) ×2 IMPLANT
PACK SRG BSC III STRL LF ECLPS (CUSTOM PROCEDURE TRAY) ×2 IMPLANT
PAD ARMBOARD POSITIONER FOAM (MISCELLANEOUS) ×4 IMPLANT
PASSER SUT SWANSON 36MM LOOP (INSTRUMENTS) IMPLANT
POUCH AIGIS-R ANTIBACT ICD LRG (Mesh General) IMPLANT
SEALANT SURG COSEAL 4ML (VASCULAR PRODUCTS) IMPLANT
SEALANT SURG COSEAL 8ML (VASCULAR PRODUCTS) IMPLANT
SOLN 0.9% NACL POUR BTL 1000ML (IV SOLUTION) ×6 IMPLANT
SOLN STERILE WATER BTL 1000 ML (IV SOLUTION) ×4 IMPLANT
SPONGE T-LAP 18X18 ~~LOC~~+RFID (SPONGE) ×8 IMPLANT
SPONGE T-LAP 4X18 ~~LOC~~+RFID (SPONGE) ×2 IMPLANT
SUT PROLENE 3 0 SH DA (SUTURE) IMPLANT
SUT SILK 1 MH (SUTURE) ×2 IMPLANT
SUT SILK 2 0 SH CR/8 (SUTURE) IMPLANT
SUT SILK 2 0SH CR/8 30 (SUTURE) ×2 IMPLANT
SUT SILK 2-0 18XBRD TIE 12 (SUTURE) ×2 IMPLANT
SUT SILK 3 0SH CR/8 30 (SUTURE) IMPLANT
SUT VIC AB 1 CTX36XBRD ANBCTR (SUTURE) ×2 IMPLANT
SUT VIC AB 2-0 CT1 TAPERPNT 27 (SUTURE) IMPLANT
SUT VIC AB 2-0 CTX 36 (SUTURE) ×2 IMPLANT
SUT VIC AB 2-0 UR6 27 (SUTURE) IMPLANT
SUT VIC AB 3-0 MH 27 (SUTURE) IMPLANT
SUT VIC AB 3-0 SH 27X BRD (SUTURE) IMPLANT
SUT VIC AB 3-0 X1 27 (SUTURE) ×2 IMPLANT
SUT VICRYL 2 TP 1 (SUTURE) IMPLANT
SYSTEM SAHARA CHEST DRAIN ATS (WOUND CARE) ×2 IMPLANT
TAPE CLOTH SURG 4X10 WHT LF (GAUZE/BANDAGES/DRESSINGS) IMPLANT
TOWEL GREEN STERILE (TOWEL DISPOSABLE) ×4 IMPLANT
TOWEL GREEN STERILE FF (TOWEL DISPOSABLE) ×2 IMPLANT
TRAY FOLEY MTR SLVR 14FR STAT (SET/KITS/TRAYS/PACK) ×2 IMPLANT
TRAY FOLEY MTR SLVR 16FR STAT (SET/KITS/TRAYS/PACK) ×2 IMPLANT
TUNNELER SHEATH ON-Q 11GX8 DSP (PAIN MANAGEMENT) IMPLANT

## 2024-02-05 NOTE — Transfer of Care (Signed)
 Immediate Anesthesia Transfer of Care Note  Patient: [name] [name]  Procedure(s) Performed: THORACOTOMY, MAJOR (Left: Chest) INSERTION, EPICARDIAL ELECTRODE LEAD (Chest)  Patient Location: PACU  Anesthesia Type:General  Level of Consciousness: awake, alert , and oriented  Airway & Oxygen  Therapy: Patient Spontanous Breathing and Patient connected to nasal cannula oxygen   Post-op Assessment: Report given to RN and Post -op Vital signs reviewed and stable  Post vital signs: Reviewed and stable  Last Vitals:  Vitals Value Taken Time  BP 90/64 02/05/24 10:06  Temp    Pulse 88 02/05/24 10:09  Resp 26 02/05/24 10:09  SpO2 96 % 02/05/24 10:09  Vitals shown include unfiled device data.  Last Pain:  Vitals:   02/05/24 0611  TempSrc:   PainSc: 0-No pain         Complications: No notable events documented.

## 2024-02-05 NOTE — Plan of Care (Signed)

## 2024-02-05 NOTE — Anesthesia Preprocedure Evaluation (Signed)
 Anesthesia Evaluation    Airway Mallampati: III  TM Distance: <3 FB Neck ROM: Full    Dental  (+) Dental Advisory Given   Pulmonary former smoker   breath sounds clear to auscultation       Cardiovascular hypertension, (-) angina + Peripheral Vascular Disease and +CHF   Rhythm:Regular     Neuro/Psych    GI/Hepatic ,GERD  ,,  Endo/Other    Renal/GU      Musculoskeletal   Abdominal   Peds  Hematology   Anesthesia Other Findings   Reproductive/Obstetrics                              Anesthesia Physical Anesthesia Plan  ASA: 3  Anesthesia Plan: General   Post-op Pain Management:    Induction: Intravenous  PONV Risk Score and Plan: 4 or greater and Ondansetron  and Dexamethasone   Airway Management Planned: Oral ETT and Double Lumen EBT  Additional Equipment: Arterial line  Intra-op Plan:   Post-operative Plan: Extubation in OR  Informed Consent: I have reviewed the patients History and Physical, chart, labs and discussed the procedure including the risks, benefits and alternatives for the proposed anesthesia with the patient or authorized representative who has indicated his/her understanding and acceptance.     Dental advisory given  Plan Discussed with: CRNA  Anesthesia Plan Comments:         Anesthesia Quick Evaluation

## 2024-02-05 NOTE — Anesthesia Procedure Notes (Signed)
 Procedure Name: Intubation Date/Time: 02/05/2024 7:44 AM  Performed by: Delores Dus, CRNAPre-anesthesia Checklist: Patient identified, Emergency Drugs available, Suction available and Patient being monitored Patient Re-evaluated:Patient Re-evaluated prior to induction Oxygen  Delivery Method: Circle system utilized Preoxygenation: Pre-oxygenation with 100% oxygen  Induction Type: IV induction Ventilation: Mask ventilation without difficulty Laryngoscope Size: Mac and 3 Grade View: Grade I Tube type: Oral Endobronchial tube: Left and 35 Fr Number of attempts: 1 Airway Equipment and Method: Stylet and Oral airway Placement Confirmation: ETT inserted through vocal cords under direct vision, positive ETCO2 and breath sounds checked- equal and bilateral Secured at: 30 cm Tube secured with: Tape Dental Injury: Teeth and Oropharynx as per pre-operative assessment

## 2024-02-05 NOTE — Interval H&P Note (Signed)
 History and Physical Interval Note:  02/05/2024 6:46 AM  [name] [name]  has presented today for surgery, with the diagnosis of HEART FAILURE.  The various methods of treatment have been discussed with the patient and family. After consideration of risks, benefits and other options for treatment, the patient has consented to  Procedure(s): THORACOTOMY, MAJOR (Left) INSERTION, EPICARDIAL ELECTRODE LEAD (N/A) as a surgical intervention.  The patient's history has been reviewed, patient examined, no change in status, stable for surgery.  I have reviewed the patient's chart and labs.  Questions were answered to the patient's satisfaction.     Ledarrius Beauchaine K Marna Weniger

## 2024-02-05 NOTE — Hospital Course (Addendum)
 HPI: The patient is a 68 year old woman with a history of hypertension, hypothyroidism, hyperlipidemia, complete heart block status post permanent pacemaker placement on the right side in 1990 and subsequent extraction and reimplantation on the left with inability to cannulate the coronary sinus, nonischemic cardiomyopathy with normal coronaries on cath in 2016 and echo in 10/2020 showing ejection fraction of of 25 to 30% and recent echo at [facility] showing ejection fraction of 20% who presents with worsening shortness of breath and fatigue due to congestive heart failure.  She underwent CRT- D upgrade by Dr. 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  An RV ICD lead was implanted and the generator change.  The previous RV lead was connected to the system to maintain MRI compatibility pending epicardial LV lead implant.  Dr. Lucas reviewed the patient's diagnostic studies and determined she would benefit from surgical intervention. He reviewed the patient's treatment options as well as the risks and benefits of surgery. [name] [name] was agreeable to proceed with surgery.  Hospital Course: [name] [name] presented to [facility] and was brought to the operating room on 02/05/24. She underwent left thoracotomy for left ventricle epicardial lead placement with capping of the RV pace/sense lead. She tolerated the procedure well and was transferred to the PACU in stable condition.  She was later transferred to the ICU.  Vital signs remained stable.  The pacemaker system was interrogated on postop day 1 and proved to be functioning appropriately.  Blood pressures are trending lower after receiving carvedilol  and Entresto .  These agents were held on postop day 1.  By the following day, heart rate was stable at 70 to 90/min and mean arterial blood pressure was stable and 70-85  range.

## 2024-02-05 NOTE — Discharge Summary (Signed)
 [address] [place] [address]             [phone]        Physician Discharge Summary  Patient ID: [name] [name] MRN: [id number] DOB/AGE: 1955-11-26 68 y.o.  Admit date: 02/05/2024 Discharge date: 02/08/2024  Admission Diagnoses:  Discharge Diagnoses:  Principal Problem:   S/P thoracotomy   Discharged Condition: stable  HPI: The patient is a 68 year old woman with a history of hypertension, hypothyroidism, hyperlipidemia, complete heart block status post permanent pacemaker placement on the right side in 1990 and subsequent extraction and reimplantation on the left with inability to cannulate the coronary sinus, nonischemic cardiomyopathy with normal coronaries on cath in 2016 and echo in 10/2020 showing ejection fraction of of 25 to 30% and recent echo at [facility] showing ejection fraction of 20% who presents with worsening shortness of breath and fatigue due to congestive heart failure.  She underwent CRT- D upgrade by Dr. 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  An RV ICD lead was implanted and the generator change.  The previous RV lead was connected to the system to maintain MRI compatibility pending epicardial LV lead implant.  Dr. Lucas reviewed the patient's diagnostic studies and determined she would benefit from surgical intervention. He reviewed the patient's treatment options as well as the risks and benefits of surgery. [name] [name] was agreeable to proceed with surgery.  [name] Course: [name] [name] presented to [name] and was brought to the operating room on 02/05/24. She underwent left thoracotomy for left ventricle epicardial lead placement with capping of the RV pace/sense lead. She tolerated the procedure well and was transferred to the PACU in stable condition.  She was later  transferred to the ICU.  Vital signs remained stable.  The pacemaker system was interrogated on postop day 1 and proved to be functioning appropriately.  Blood pressures are trending lower after receiving carvedilol  and Entresto  so they were discontinued. By the following day, heart rate and mean arterial blood pressure remained stable. Chest tube was removed on POD1 without complication. She was ambulating well on room air and tolerating a diet. Her incisions were healing well without sign of infection. She was restarted on home Coreg  at discharge but Entresto  would be restarted as an outpatient. She was felt stable for discharge home.   Consults: None  Significant Diagnostic Studies:  [facility]                         [name] [name]                                                                                     [name] [name] INTERNAL MEDICINE  [id number]                                                                                DOB: Jul 08, 1955  Age: 68                   ECHO-DOPPLER REPORT                             Date: 11/02/2023                                                                                 Female                                                                                     Outpatient                                                                       LOCATION: [facility]                                                                                MD1: RADHIKA KALISETTI            STUDY: ECHO COMPLETE                             SOUND QLTY: Moderate                       ECHO: Yes                                           STRAIN: Yes                          COLOR: Yes  3D: No                          DOPPLER: Yes                                               BP: 116 / 68                  RV BIOPSY: No                                                HR: 88  BPM                     CONTRAST: No                                            Height: 64 in                       MEDIUM: N/A                                           Weight: 183 lbs                    MACHINE: [facility] - EpiQ-1                         BSA: 1.9                     ------------------------------------------------------------------------------------------     History: CHF      Reason: LV function   Indication: I50.22- Chronic systolic (congestive) heart failure (CMS/HHS-HCC).                   I42.9- Cardiomyopathy, unspecified (CMS/HHS-HCC).                                     CONCLUSION ------------------------------------------------------------------------------- SEVERE LEFT VENTRICULAR SYSTOLIC DYSFUNCTION WITH NO LVH ESTIMATED EF: 20%, CALC EF(2D): 16% DIASTOLIC FUNCTION CAN'T BE DETERMINED NORMAL RIGHT VENTRICULAR SYSTOLIC FUNCTION VALVULAR REGURGITATION: TRIVIAL AR, TRIVIAL MR, TRIVIAL PR, TRIVIAL TR ESTIMATED RVSP: 37 mmHg (Normal) NO VALVULAR STENOSIS SMALL PERICARDIAL EFFUSION       ECHOCARDIOGRAPHIC DESCRIPTIONS ----------------------------------------------------------- AORTIC ROOT         Asc Ao Size: Normal                                Dissection: INDETERMINATE FOR DISSECTION                                               AORTIC VALVE  Leaflets: Tricuspid                               Mobility: Fully Mobile                    Morphology: Normal                                                                                    AR: TRIVIAL AR                                    AS: No AS                               AV Mass: No Masses                     LEFT VENTRICLE                Size: Normal                                                                                   LVH: None                                 Contraction: SEVERE DECREASE                      Closest EF: 20%                                     Calc. EF:  16%(2D)                         LV GLS (TT): -5.1%                                                                       Strain Analysis: GLS > -16% Global longitudinal strain is abnormal.                                 LV Mass: No Masses  [name] [name]: can't be determined                                                         WALL MOTION                            Basal             Mid               Apical               Anterior Septum: Hypokinetic       Hypokinetic       Hypokinetic           Anterior Wall: Hypokinetic       Hypokinetic       Hypokinetic            Lateral Wall: Normal            Normal            Hypokinetic          Posterior Wall: Hypokinetic       Hypokinetic                             Inferior Wall: Hypokinetic       Hypokinetic       Hypokinetic         Inferior Septum: Hypokinetic       Hypokinetic                         Rest Rest Score Index: 1.88     MITRAL VALVE            Leaflets: Normal                                  Mobility: Fully Mobile                    Morphology: Normal                                                                                      MR: TRIVIAL MR                                    MS: No MS                             MV masses: No Masses                     LEFT ATRIUM                Size: Normal  LA masses: No Masses                     MAIN PA                Size: Not Seen                       PULMONIC VALVE            Leaflets: UNKNOWN                                 Mobility: Fully Mobile                    Morphology: Normal                                                            PR: TRIVIAL PR                                    PS: No PS                             PV masses: No Masses                     RIGHT VENTRICLE                Size: Normal                                 Free Wall: Normal                          Contraction: Normal                                                     RV masses: No Masses                     TRICUSPID VALVE            Leaflets: Normal                                  Mobility: Fully Mobile                    Morphology: Normal                                        TR: TRIVIAL TR                                    TS: No TS  TV masses: No Masses                     RIGHT ATRIUM                Size: SMALL                                 RA masses: No Masses                     PERICARDIUM               Fluid: MILD EFFUSION                                       INFERIOR VENA CAVA                Size: Normal                                  Max Diam: 1.3 cm                                  Min Diam: 0.7 cm                      Percent Change: 46 %                                                                       Resp.Collapse: ABNORMAL RESPIRATORY COLLAPSE                                           RESTING ECHOCARDIOGRAPHIC MEASUREMENTS --------------------------------------------------- AORTA Measurements            Values    Units     Normal Range                                Annulus: 2.6       cm        [1.9 - 2.7]                                Asc.Aorta: 3.3       cm        [1.9 - 3.5]                           Asc. Aorta BSA: 1.7       cm/m2     [1.0 - 2.2]                     LEFT VENTRICLE  LVIDd: 5.6       cm        [3.8 - 5.2]                                    LVIDs: 5         cm        [2.2 - 3.5]                                LVIDd/BSA: 3         cm/m2                                                      SWT: 0.7       cm        [0.6 - 0.9]                                      PWT: 0.9       cm        [0.6 - 0.9]                             LV EF MOD BP: 16        %                                                LV EDV MOD BP: 247       mL                                                      LVEDVi: 131       mL/m2     [29 - 61]                              LV ESV MOD BP: 207       mL                                                     LVESVi: 110       mL/m2     [8 - 24]                       DIASTOLIC FUNCTION          MV Lat E'Vel.: 15.3      cm/s  LEFT ATRIUM                LA Diam: 4.9       cm        [2.7 - 3.8]                     RIGHT ATRIUM                RA Area: 6.7       cm2       [ <= 20]                                  RA Volume: 11        mL                                                       RAVi: 6         mL/m2     [15 - 27]                       INFERIOR VENA CAVA                Max.IVC: 1.3       cm        [ <= 2.1]                                    Min.IVC: 0.7       cm        [ <= 1.7]                             Percent Change: 46        %                                         Pressures, Gradients, and DOPPLER ECHO ---------------------------------------------------       Tricuspid Regurgitation Values            TR Pk. Vel.: 2.7       m/s                                                RA Pressure: 8         mmHg                                                     RVSP: 37        mmHg      Peak                                  Perform By:  Alexandra Moan, RCS                                                       Res. Person: Alexandra Moan, RCS                                                   Electronically signed by Oneil Pinal, M.D. on:11/03/2023 6:27:40 PM with status of Final   The images are stored in the [name] system, please contact the clinical provider for images related to this study. Procedure Note   Pinal Oneil Novel, MD - 11/03/2023 Formatting of this note might be different from the original.               [name]                         [place]                                                                     [name]  INTERNAL MEDICINE                     [id number]                                                                DOB: 1955/08/03  Age: 29                   ECHO-DOPPLER REPORT                             Date: 11/02/2023                                                                                  Female Outpatient                                                                       LOCATION: [facility]  MD1: RADHIKA KALISETTI            STUDY: ECHO COMPLETE                             SOUND QLTY: Moderate                       ECHO: Yes                                           STRAIN: Yes            COLOR: Yes                                               3D: No          DOPPLER: Yes                                               BP: 116 / 68                  RV BIOPSY: No                                                HR: 88 BPM     CONTRAST: No                                            Height: 64 in         MEDIUM: N/A                                           Weight: 183 lbs                    MACHINE: [facility] - EpiQ-1                         BSA: 1.9       ------------------------------------------------------------------------------------------     History: CHF      Reason: LV function   Indication: I50.22- Chronic systolic (congestive) heart failure (CMS/HHS-HCC).         I42.9- Cardiomyopathy, unspecified (CMS/HHS-HCC).                             CONCLUSION ------------------------------------------------------------------------------- SEVERE LEFT VENTRICULAR SYSTOLIC DYSFUNCTION WITH NO LVH ESTIMATED EF: 20%, CALC EF(2D): 16% DIASTOLIC FUNCTION CAN'T BE DETERMINED NORMAL RIGHT VENTRICULAR SYSTOLIC FUNCTION VALVULAR REGURGITATION: TRIVIAL AR, TRIVIAL MR, TRIVIAL PR, TRIVIAL TR ESTIMATED RVSP: 37 mmHg (Normal) NO VALVULAR STENOSIS SMALL PERICARDIAL EFFUSION       ECHOCARDIOGRAPHIC DESCRIPTIONS  ----------------------------------------------------------- AORTIC ROOT  Asc Ao Size: Normal                                Dissection: INDETERMINATE FOR DISSECTION                             AORTIC VALVE            Leaflets: Tricuspid                               Mobility: Fully Mobile                    Morphology: Normal                                                                  AR: TRIVIAL AR                                    AS: No AS                               AV Mass: No Masses                     LEFT VENTRICLE                Size: Normal                                                                 LVH: None                                 Contraction: SEVERE DECREASE                      Closest EF: 20%                                     Calc. EF: 16%(2D)                         LV GLS (TT): -5.1%                                                     Strain Analysis: GLS > -16% Global longitudinal strain is abnormal.                 LV Mass: No Masses  [name] [name]: can't be determined                                      WALL MOTION                            Basal             Mid               Apical           Anterior Septum: Hypokinetic       Hypokinetic       Hypokinetic           Anterior Wall: Hypokinetic       Hypokinetic       Hypokinetic            Lateral Wall: Normal            Normal            Hypokinetic          Posterior Wall: Hypokinetic       Hypokinetic                             Inferior Wall: Hypokinetic       Hypokinetic       Hypokinetic         Inferior Septum: Hypokinetic       Hypokinetic                         Rest Rest Score Index: 1.88     MITRAL VALVE            Leaflets: Normal                                  Mobility: Fully Mobile                    Morphology: Normal                                                                 MR: TRIVIAL MR                                    MS: No MS                              MV masses: No Masses                     LEFT ATRIUM                Size: Normal  LA masses: No Masses                     MAIN PA                Size: Not Seen                       PULMONIC VALVE            Leaflets: UNKNOWN                                 Mobility: Fully Mobile                    Morphology: Normal                                                            PR: TRIVIAL PR                                    PS: No PS                             PV masses: No Masses                     RIGHT VENTRICLE                Size: Normal                                 Free Wall: Normal                         Contraction: Normal                                                     RV masses: No Masses                     TRICUSPID VALVE            Leaflets: Normal                                  Mobility: Fully Mobile                    Morphology: Normal                                        TR: TRIVIAL TR                                    TS: No TS  TV masses: No Masses                     RIGHT ATRIUM                Size: SMALL                                 RA masses: No Masses                     PERICARDIUM               Fluid: MILD EFFUSION                                       INFERIOR VENA CAVA                Size: Normal                                  Max Diam: 1.3 cm                                  Min Diam: 0.7 cm                      Percent Change: 46 %                                                         Resp.Collapse: ABNORMAL RESPIRATORY COLLAPSE                            RESTING ECHOCARDIOGRAPHIC MEASUREMENTS --------------------------------------------------- AORTA Measurements            Values    Units     Normal Range                                Annulus: 2.6       cm        [1.9 - 2.7]                                Asc.Aorta: 3.3       cm        [1.9 - 3.5]                           Asc. Aorta BSA: 1.7       cm/m2     [1.0 - 2.2]                     LEFT VENTRICLE                  LVIDd: 5.6       cm        [3.8 - 5.2]  LVIDs: 5         cm        [2.2 - 3.5]                                LVIDd/BSA: 3         cm/m2                                                      SWT: 0.7       cm        [0.6 - 0.9]                                      PWT: 0.9       cm        [0.6 - 0.9]                             LV EF MOD BP: 16        %                                                LV EDV MOD BP: 247       mL                                                     LVEDVi: 131       mL/m2     [29 - 61]                              LV ESV MOD BP: 207       mL                                                     LVESVi: 110       mL/m2     [8 - 24]                       DIASTOLIC FUNCTION          MV Lat E'Vel.: 15.3      cm/s                                      LEFT ATRIUM                LA Diam: 4.9       cm        [2.7 - 3.8]  RIGHT ATRIUM                RA Area: 6.7       cm2       [ <= 20]                                  RA Volume: 11        mL                                                        RAVi: 6         mL/m2     [15 - 27]                       INFERIOR VENA CAVA                Max.IVC: 1.3       cm        [ <= 2.1]                                    Min.IVC: 0.7       cm        [ <= 1.7]                            Percent Change: 46        %                                         Pressures, Gradients, and DOPPLER ECHO ---------------------------------------------------       Tricuspid Regurgitation Values            TR Pk. Vel.: 2.7       m/s                                                RA Pressure: 8         mmHg                                                     RVSP: 37        mmHg      Peak                                   Perform By: Alexandra Moan, RCS                                            Res. Person: Alexandra Moan, RCS  Electronically signed by Oneil Pinal, M.D. on:11/03/2023 6:27:40 PM with status of Final     Treatments: surgery: Surgeon:  Dorise LOIS Fellers, MD   First Assistant: None     Preoperative Diagnosis:  chronic systolic heart failure.   Postoperative Diagnosis: same   Procedure:   1. Left anterolateral thoracotomy 2. Insertion of 2 left ventricular epicardial pacing leads 3. Revision of Biventricular pacing system    Discharge Exam: Blood pressure (!) 108/58, pulse 70, temperature 97.7 F (36.5 C), temperature source Oral, resp. rate 20, height 5' 4 (1.626 m), weight 85.9 kg, SpO2 95%. General appearance: alert and cooperative Neurologic: intact Heart: regular rate and rhythm Lungs: clear to auscultation bilaterally Extremities: no edema Wound: incisions look good.  Disposition: Discharge disposition: 01-Home or Self Care        Allergies as of 02/08/2024       Reactions   Marshmallow [althaea Officinalis]    Rash, hives, itching, SOB Pt states this is no longer an issue for her   Other    Jello - Rash, hives, itching, SOB Pt states is no longer an issue for her   Contrast Media [iodinated Contrast Media]    unknown   Latex Rash   Nickel Rash        Medication List     STOP taking these medications    sacubitril -valsartan  24-26 MG Commonly known as: ENTRESTO        TAKE these medications    acetaminophen  325 MG tablet Commonly known as: Tylenol  Take 2 tablets (650 mg total) by mouth every 6 (six) hours as needed for mild pain (pain score 1-3).   aspirin  EC 81 MG tablet Take 81 mg by mouth daily.   carvedilol  6.25 MG tablet Commonly known as: COREG  Take 1 tablet (6.25 mg total) by mouth 2 (two) times daily.   clonazePAM  0.5 MG tablet Commonly known as: KLONOPIN  TAKE 1 TABLET BY MOUTH ONCE DAILY AS  NEEDED FOR ANXIETY/ SLEEP   cyanocobalamin 1000 MCG tablet Commonly known as: VITAMIN B12 Take 1,000 mcg by mouth daily.   levothyroxine  112 MCG tablet Commonly known as: SYNTHROID  Take 1 tablet by mouth once daily   lovastatin  20 MG tablet Commonly known as: MEVACOR  TAKE 1 TABLET BY MOUTH AT BEDTIME   oxyCODONE  5 MG immediate release tablet Commonly known as: Oxy IR/ROXICODONE  Take 1 tablet (5 mg total) by mouth every 6 (six) hours as needed for severe pain (pain score 7-10).   spironolactone  25 MG tablet Commonly known as: ALDACTONE  Take 12.5 mg by mouth daily.   Vitamin D3 25 MCG (1000 UT) Caps Take 1,000 Units by mouth daily.        Follow-up Information     Rutha Manuelita HERO, PA-C Follow up on 02/15/2024.   Specialties: Physician Assistant, Thoracic Surgery Why: Follow up appointment is at 1:30PM. Please get a chest xray 1 hour prior to your appointment on the 2nd floor of our office. Contact information: [address] [place] [address] [phone]                 Signed: Con GORMAN [place], PA-C  02/08/2024, 10:32 AM

## 2024-02-05 NOTE — Discharge Instructions (Signed)
 Discharge Instructions:  1. You may shower, please wash incisions daily with soap and water  and keep dry.  If you wish to cover wounds with dressing you may do so but please keep clean and change daily.  No tub baths or swimming until incisions have completely healed.  If your incisions become red or develop any drainage please call our office at [phone]  2. No Driving until cleared by Dr. Jeralyn office and you are no longer using narcotic pain medications  3. Fever of 101.5 for at least 24 hours with no source, please contact our office at [phone]  4. Activity- up as tolerated, please walk at least 3 times per day.  Avoid strenuous activity until cleared by Dr. Jeralyn office  5. If any questions or concerns arise, please do not hesitate to contact our office at [phone]

## 2024-02-05 NOTE — Progress Notes (Signed)
 [facility] PM Rounding Progress Note  Reports heartburn, back pain and neck pain.  Also feels really tired and wants to sleep. Has not been out of bed.  Vitals:   02/05/24 1800 02/05/24 1900  BP:    Pulse: (!) 101 99  Resp: (!) 24 (!) 25  Temp:    SpO2: 94% 92%    Plan: - Robaxin  for muscle aches and Tums for heartburn - Encourage mobilization if up to it  Con Clunes, MD Cardiothoracic Surgery Pager: [phone]

## 2024-02-05 NOTE — Op Note (Signed)
 CARDIOTHORACIC SURGERY OPERATIVE NOTE  02/05/2024 [name] [name] [id number]  Surgeon:  Dorise LOIS Fellers, MD  First Assistant: None   Preoperative Diagnosis:  chronic systolic heart failure.  Postoperative Diagnosis: same  Procedure:  1. Left anterolateral thoracotomy 2. Insertion of 2 left ventricular epicardial pacing leads 3. Revision of Biventricular pacing system  Anesthesia:  General Endotracheal   Clinical History/Surgical Indication:  This 68 year old woman has severe nonischemic cardiomyopathy with ejection fraction of 20% and NYHA class III congestive heart failure symptoms.  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  I agree that the next best option is to proceed with placement of a LV epicardial pacing lead through a small left thoracotomy with connection to the recently implanted left chest wall generator.  Her RV pace/sense lead will be capped when the epicardial LV lead is implanted. I discussed the operative procedure with the patient including alternatives, benefits and risks; including but not limited to bleeding, blood transfusion, infection, epicardial lead malfunction, organ dysfunction, and death.  I also discussed the possibility that this may not improve her symptoms.  [name] [name] understands and agrees to proceed.     Preparation:  The patient was seen in the preoperative holding area and the correct patient, correct operation, correct operative side were confirmed with the patient after reviewing the medical record. The consent was signed by me. Preoperative antibiotics were given.  The patient was taken back to the operating room and positioned supine on the operating room table. After being placed under general endotracheal anesthesia by the anesthesia team using a double lumen tube a foley catheter was placed. A roll was  place beneath the left back to tilt the left side up slightly. The chest was prepped with betadine  soap and solution.  A surgical time-out was taken and the correct patient,operative side, and operative procedure were confirmed with the nursing and anesthesia staff.   Operative Procedure:  A short anterolateral thoracotomy incision was made below the left breast and pectoralis border. The subcutaneous tissue was divided using electrocautery. The serratus muscle was split along its fibers. The pleural space was entered through the 6th intercostal space. The pericardium was immediately adjacent to the chest wall due to cardiac enlargement. The pericardium was opened. There was a moderate sized serous pericardial effusion. A site was chosen for pacing lead insertion on the mid/high-lateral wall. A [employer] pacing lead was screwed into the myocardium ( model [id number], SN [id number]). Testing showed good sensitivity, threshold and impedance.  Another lead of the same type was screwed into the myocardium on the posterior wall ( model [id number], SN [id number]). Testing showed good sensitivity, threshold and impedance. The generator pocket was opened through the prior incision. There was a small amout of serous fluid present in the pocket. The generator was removed. The two LV epicardial leads were brought out through the interspace and tunneled along the subcutaneous tissue of the chest wall up to the generator pocket. The first lead ([id number]) was connected to the generator and the other lead was capped. The old pace/sense RV lead was capped. Through the device the RA Ps was 4.70mV, impedence 440, and threshold 0.75V @ 0.4 ms. The RV lead had an impedence of 540 and a threshold of 0.75V @ 0.5 ms. No Rs was tested. The LV lead had an impedence of 500 ohms and a threshold  of 1.0V @ 0.52ms. The pocket was enlarged slightly inferiorly. The generator was put in a [employer] antibiotic pouch ( REF [id number], LOT [id number]) replaced  in the pocket and the excess lead coiled behind it. The pocket was irrigated with saline. The pocket was closed with continuous 3-0 Vicryl suture. The subcutaneous tissue was closed with 3-0 vicryl continuous suture. The skin was closed with 3-0 vicryl subcuticular suture.  A 28 Blake drain was brought through a stab incision inferior to the thoracotomy and place in the posterior pleural space.  The thoracotomy incision was closed using continuous #0 vicryl suture for the serratus muscle, 2-0 vicryl subcutaneous suture and 3-0 vicryl subcuticular suture. 40 cc of  EXPAREL  was infiltrated along the intercostal nerves and the muscle. Dermabond was applied.  All sponge, needle, and instrument counts were reported correct at the end of the case. Dry sterile dressings were placed over the incisions and around the chest tube which was connected to pleurevac suction. The patient was extubated and transported to the PACU in satisfactory and stable condition.

## 2024-02-06 ENCOUNTER — Inpatient Hospital Stay (HOSPITAL_COMMUNITY): Payer: PRIVATE HEALTH INSURANCE

## 2024-02-06 DIAGNOSIS — J90 Pleural effusion, not elsewhere classified: Secondary | ICD-10-CM

## 2024-02-06 LAB — CBC
HCT: 43.1 % (ref 36.0–46.0)
Hemoglobin: 15.2 g/dL — ABNORMAL HIGH (ref 12.0–15.0)
MCH: 32.5 pg (ref 26.0–34.0)
MCHC: 35.3 g/dL (ref 30.0–36.0)
MCV: 92.3 fL (ref 80.0–100.0)
Platelets: 197 K/uL (ref 150–400)
RBC: 4.67 MIL/uL (ref 3.87–5.11)
RDW: 13.2 % (ref 11.5–15.5)
WBC: 14.5 K/uL — ABNORMAL HIGH (ref 4.0–10.5)
nRBC: 0 % (ref 0.0–0.2)

## 2024-02-06 LAB — BASIC METABOLIC PANEL WITH GFR
Anion gap: 11 (ref 5–15)
BUN: 14 mg/dL (ref 8–23)
CO2: 22 mmol/L (ref 22–32)
Calcium: 9.6 mg/dL (ref 8.9–10.3)
Chloride: 92 mmol/L — ABNORMAL LOW (ref 98–111)
Creatinine, Ser: 0.84 mg/dL (ref 0.44–1.00)
GFR, Estimated: >60 mL/min (ref >60)
Glucose, Bld: 142 mg/dL — ABNORMAL HIGH (ref 70–99)
Potassium: 4.3 mmol/L (ref 3.5–5.1)
Sodium: 125 mmol/L — ABNORMAL LOW (ref 135–145)

## 2024-02-06 MED ORDER — CARVEDILOL 3.125 MG PO TABS
3.1250 mg | ORAL_TABLET | Freq: Two times a day (BID) | ORAL | Status: DC
Start: 2024-02-06 — End: 2024-02-06

## 2024-02-06 MED ORDER — CHLORHEXIDINE GLUCONATE CLOTH 2 % EX PADS
6.0000 | MEDICATED_PAD | Freq: Every day | CUTANEOUS | Status: DC
  Administered 2024-02-06 – 2024-02-07 (×2): 6 via TOPICAL

## 2024-02-06 MED ORDER — FUROSEMIDE 10 MG/ML IJ SOLN
20.0000 mg | Freq: Once | INTRAMUSCULAR | Status: AC
  Administered 2024-02-06: 20 mg via INTRAVENOUS
  Filled 2024-02-06: qty 2

## 2024-02-06 NOTE — Progress Notes (Signed)
 TCTS PM Rounding Progress Note  Blood pressure low this afternoon after receiving coreg  yesterday evening and entresto  this morning.  Relatively asymptomatic, able to walk without dizziness.  Chest tube outputs low, so tube removed this evening.  Reports she had a similar issue with low blood pressure after her hip surgery and had to go to rehab for 9 days because of it.  Vitals:   02/06/24 1700 02/06/24 1800  BP: (!) 105/59 (!) 75/35  Pulse: 86 96  Resp: (!) 22 (!) 22  Temp:    SpO2: 95% 97%    Plan: - Continue to hold coreg  and entresto  - Foley out, will be due to void overnight  Con Clunes, MD Cardiothoracic Surgery Pager: [phone]

## 2024-02-06 NOTE — Progress Notes (Addendum)
 1 Day Post-Op Procedure(s) (LRB): THORACOTOMY, MAJOR (Left) INSERTION, EPICARDIAL ELECTRODE LEAD (N/A) Subjective: Sitting in chair eating breakfast this morning Mostly complaining of back pain Has not walked yet Tube output very thin but high overnight [employer] rep at bedside  Objective: Vital signs in last 24 hours: BP 115/66 (BP Location: Left Arm)   Pulse (!) 108   Temp 98.6 F (37 C) (Oral)   Resp (!) 32   Ht 5' 4 (1.626 m)   Wt 86.3 kg   LMP  (LMP Unknown)   SpO2 92%   BMI 32.66 kg/m  Filed Weights   02/05/24 0555 02/06/24 0600  Weight: 83.9 kg 86.3 kg    Hemodynamic parameters for last 24 hours:    Intake/Output from previous day: 10/24 0701 - 10/25 0700 In: 1249.9 [P.O.:250; I.V.:800; IV Piggyback:199.9] Out: 2041 [Urine:1400; Blood:25; Chest Tube:616] Intake/Output this shift: No intake/output data recorded.  Physical Exam: General - Resting comfortably in chair CV - RRR Resp - Unlabored on [place], chest tube output with thin serosanguinous output Abd - Soft, ND/NT Ext - Minimal edema  Lab Results:    Latest Ref Rng & Units 02/06/2024    5:45 AM 02/03/2024    2:00 PM 12/18/2023    3:30 PM  CBC  WBC 4.0 - 10.5 K/uL 14.5  6.1  7.2   Hemoglobin 12.0 - 15.0 g/dL 84.7  85.4  85.3   Hematocrit 36.0 - 46.0 % 43.1  42.8  43.3   Platelets 150 - 400 K/uL 197  189  240       Latest Ref Rng & Units 02/06/2024    5:45 AM 02/03/2024    2:00 PM 12/29/2023    1:59 PM  CMP  Glucose 70 - 99 mg/dL 857  92  829   BUN 8 - 23 mg/dL 14  14  11    Creatinine 0.44 - 1.00 mg/dL 9.15  8.99  9.09   Sodium 135 - 145 mmol/L 125  132  135   Potassium 3.5 - 5.1 mmol/L 4.3  4.3  4.0   Chloride 98 - 111 mmol/L 92  98  101   CO2 22 - 32 mmol/L 22  27  20    Calcium 8.9 - 10.3 mg/dL 9.6  9.5  89.9   Total Protein 6.5 - 8.1 g/dL  7.3    Total Bilirubin 0.0 - 1.2 mg/dL  0.6    Alkaline Phos 38 - 126 U/L  76    AST 15 - 41 U/L  18    ALT 0 - 44 U/L  14      CXR: Small left  pleural effusion  Assessment/Plan: S/P Procedure(s) (LRB): THORACOTOMY, MAJOR (Left) INSERTION, EPICARDIAL ELECTRODE LEAD (N/A) POD1 s/p thoracotomy, epicardial lead placement NEURO- intact  Pain control PRN CV- Paced rhythm in 90s  Pacer interrogated this AM RESP- Continued improved lung aeration             Continue IS, pulm hygiene, ambulation  Pleural Chest tube with high output overnight, will walk this morning and re-evaluate around noon RENAL- creatinine and lytes Ok  Weight up 5 lbs with high chest tube output             Will do IV lasix  x 1 today  Foley out this afternoon after lasix  GI- tolerating diet  BM: none yet Endo- BG well controlled No insulin needed ID- NAI DVT ppx - SCD + lovenox   Dispo: Floor today    LOS: 1 day  Con RAMAN Reola Buckles 02/06/2024

## 2024-02-07 ENCOUNTER — Inpatient Hospital Stay (HOSPITAL_COMMUNITY): Payer: PRIVATE HEALTH INSURANCE

## 2024-02-07 LAB — CBC
HCT: 38.2 % (ref 36.0–46.0)
Hemoglobin: 13.5 g/dL (ref 12.0–15.0)
MCH: 32.5 pg (ref 26.0–34.0)
MCHC: 35.3 g/dL (ref 30.0–36.0)
MCV: 92 fL (ref 80.0–100.0)
Platelets: 158 K/uL (ref 150–400)
RBC: 4.15 MIL/uL (ref 3.87–5.11)
RDW: 13.2 % (ref 11.5–15.5)
WBC: 11.3 K/uL — ABNORMAL HIGH (ref 4.0–10.5)
nRBC: 0 % (ref 0.0–0.2)

## 2024-02-07 LAB — COMPREHENSIVE METABOLIC PANEL WITH GFR
ALT: 10 U/L (ref 0–44)
AST: 15 U/L (ref 15–41)
Albumin: 2.9 g/dL — ABNORMAL LOW (ref 3.5–5.0)
Alkaline Phosphatase: 46 U/L (ref 38–126)
Anion gap: 14 (ref 5–15)
BUN: 15 mg/dL (ref 8–23)
CO2: 23 mmol/L (ref 22–32)
Calcium: 9 mg/dL (ref 8.9–10.3)
Chloride: 92 mmol/L — ABNORMAL LOW (ref 98–111)
Creatinine, Ser: 0.9 mg/dL (ref 0.44–1.00)
GFR, Estimated: >60 mL/min (ref >60)
Glucose, Bld: 116 mg/dL — ABNORMAL HIGH (ref 70–99)
Potassium: 4 mmol/L (ref 3.5–5.1)
Sodium: 129 mmol/L — ABNORMAL LOW (ref 135–145)
Total Bilirubin: 1 mg/dL (ref 0.0–1.2)
Total Protein: 6.3 g/dL — ABNORMAL LOW (ref 6.5–8.1)

## 2024-02-07 NOTE — Progress Notes (Signed)
 Pt arrived from ..2H..., A/ox .SABRASABRApt denies any pain, MD aware,CCMD called. CHG bath given,no further needs at this time

## 2024-02-07 NOTE — Plan of Care (Signed)
  Problem: Education: Goal: Knowledge of General Education information will improve Description: Including pain rating scale, medication(s)/side effects and non-pharmacologic comfort measures Outcome: Progressing   Problem: Clinical Measurements: Goal: Diagnostic test results will improve Outcome: Progressing Goal: Cardiovascular complication will be avoided Outcome: Progressing   Problem: Activity: Goal: Risk for activity intolerance will decrease Outcome: Progressing   Problem: Nutrition: Goal: Adequate nutrition will be maintained Outcome: Progressing   Problem: Coping: Goal: Level of anxiety will decrease Outcome: Progressing

## 2024-02-08 ENCOUNTER — Encounter (HOSPITAL_COMMUNITY): Payer: Self-pay | Admitting: Surgery

## 2024-02-08 DIAGNOSIS — I4729 Other ventricular tachycardia: Secondary | ICD-10-CM

## 2024-02-08 MED ORDER — ACETAMINOPHEN 325 MG PO TABS: 650.0000 mg | ORAL_TABLET | Freq: Four times a day (QID) | ORAL | Status: DC | PRN

## 2024-02-08 MED ORDER — OXYCODONE HCL 5 MG PO TABS: 5.0000 mg | ORAL_TABLET | Freq: Four times a day (QID) | ORAL | 0 refills | Status: AC | PRN

## 2024-02-08 NOTE — Progress Notes (Signed)
 Discharge Nurse Summary: DC order noted per MD. DC RN at bedside with patient. Patient agreeable with discharge plan, agreeable to transport to dc lounge to await family pickup. AVS printed/reviewed. PIV removed. Telemonitor not present on assessment. No DME needs. No home/TOC meds. CP/Edu resolved. Patient dressed and ready to go. All belongings accounted for. Transport called to transport patient to [facility].   Rosario EMERSON Lund, RN

## 2024-02-08 NOTE — Progress Notes (Signed)
 3 Days Post-Op Procedure(s) (LRB): THORACOTOMY, MAJOR (Left) INSERTION, EPICARDIAL ELECTRODE LEAD (N/A) Subjective:  She feels well, ambulating without difficulty, off oxygen . Pain under good control. Feels ready to go home.  Objective: Vital signs in last 24 hours: Temp:  [97.7 F (36.5 C)-98.4 F (36.9 C)] 98.4 F (36.9 C) (10/27 0225) Pulse Rate:  [69-107] 70 (10/26 2245) Cardiac Rhythm: Ventricular tachycardia (10/27 0328) Resp:  [12-24] 20 (10/27 0225) BP: (92-117)/(55-81) 107/67 (10/26 2245) SpO2:  [92 %-96 %] 96 % (10/26 2245)  Hemodynamic parameters for last 24 hours:    Intake/Output from previous day: 10/26 0701 - 10/27 0700 In: 600 [P.O.:600] Out: 500 [Urine:500] Intake/Output this shift: No intake/output data recorded.  General appearance: alert and cooperative Neurologic: intact Heart: regular rate and rhythm Lungs: clear to auscultation bilaterally Extremities: no edema Wound: incisions look good.  Lab Results: Recent Labs    02/06/24 0545 02/07/24 0313  WBC 14.5* 11.3*  HGB 15.2* 13.5  HCT 43.1 38.2  PLT 197 158   BMET:  Recent Labs    02/06/24 0545 02/07/24 0313  NA 125* 129*  K 4.3 4.0  CL 92* 92*  CO2 22 23  GLUCOSE 142* 116*  BUN 14 15  CREATININE 0.84 0.90  CALCIUM 9.6 9.0    PT/INR: No results for input(s): LABPROT, INR in the last 72 hours. ABG No results found for: PHART, HCO3, TCO2, ACIDBASEDEF, O2SAT CBG (last 3)  No results for input(s): GLUCAP in the last 72 hours.  Assessment/Plan: S/P Procedure(s) (LRB): THORACOTOMY, MAJOR (Left) INSERTION, EPICARDIAL ELECTRODE LEAD (N/A)  POD 3 Hemodynamically stable in paced rhythm 70. She had a couple brief episodes of wide complex tachycardia overnight that she did not feel. Will resume her Coreg  but hold off on Entresto  until she is seen back in the office to be sure BP is stable. Keep chest tube suture in and will remove in the office. Plan to see her back in  the office in 1-2 weeks to check incisions, BP, and remove chest tube suture.   LOS: 3 days    [name] [name] [name] [name] 02/08/2024

## 2024-02-09 ENCOUNTER — Ambulatory Visit: Payer: PRIVATE HEALTH INSURANCE

## 2024-02-09 DIAGNOSIS — I428 Other cardiomyopathies: Secondary | ICD-10-CM | POA: Diagnosis not present

## 2024-02-09 NOTE — Anesthesia Postprocedure Evaluation (Signed)
 Anesthesia Post Note  Patient: [name] [name]  Procedure(s) Performed: THORACOTOMY, MAJOR (Left: Chest) INSERTION, EPICARDIAL ELECTRODE LEAD (Chest)     Patient location during evaluation: PACU Anesthesia Type: General Level of consciousness: patient cooperative and awake Pain management: pain level controlled Vital Signs Assessment: post-procedure vital signs reviewed and stable Respiratory status: spontaneous breathing, nonlabored ventilation, respiratory function stable and patient connected to nasal cannula oxygen  Cardiovascular status: blood pressure returned to baseline and stable Postop Assessment: no apparent nausea or vomiting Anesthetic complications: no   No notable events documented.                 Rosmarie Esquibel

## 2024-02-10 ENCOUNTER — Encounter (HOSPITAL_COMMUNITY): Payer: Self-pay | Admitting: Internal Medicine

## 2024-02-10 ENCOUNTER — Telehealth: Payer: Self-pay

## 2024-02-10 ENCOUNTER — Ambulatory Visit (HOSPITAL_COMMUNITY)
Admission: RE | Admit: 2024-02-10 | Discharge: 2024-02-10 | Disposition: A | Discharge status: Home / Self Care | Payer: PRIVATE HEALTH INSURANCE | Source: Ambulatory Visit | Attending: Internal Medicine | Admitting: Internal Medicine

## 2024-02-10 ENCOUNTER — Ambulatory Visit: Payer: Self-pay | Admitting: Cardiology

## 2024-02-10 VITALS — BP 136/82 | HR 70 | Ht 64.0 in | Wt 191.8 lb

## 2024-02-10 DIAGNOSIS — I4891 Unspecified atrial fibrillation: Secondary | ICD-10-CM | POA: Diagnosis not present

## 2024-02-10 DIAGNOSIS — I480 Paroxysmal atrial fibrillation: Secondary | ICD-10-CM

## 2024-02-10 DIAGNOSIS — D6869 Other thrombophilia: Secondary | ICD-10-CM

## 2024-02-10 LAB — CUP PACEART REMOTE DEVICE CHECK
Battery Remaining Longevity: 57 mo
Battery Remaining Percentage: 93 %
Battery Voltage: 3.04 V
Brady Statistic AP VP Percent: 1 %
Brady Statistic AP VS Percent: 1 %
Brady Statistic AS VP Percent: 99 %
Brady Statistic AS VS Percent: 1 %
Brady Statistic RA Percent Paced: 1 %
Date Time Interrogation Session: 20251028020545
HighPow Impedance: 79 Ohm
Lead Channel Impedance Value: 350 Ohm
Lead Channel Impedance Value: 390 Ohm
Lead Channel Impedance Value: 550 Ohm
Lead Channel Pacing Threshold Amplitude: 0.5 V
Lead Channel Pacing Threshold Amplitude: 0.75 V
Lead Channel Pacing Threshold Amplitude: 0.75 V
Lead Channel Pacing Threshold Pulse Width: 0.5 ms
Lead Channel Pacing Threshold Pulse Width: 0.5 ms
Lead Channel Pacing Threshold Pulse Width: 0.5 ms
Lead Channel Sensing Intrinsic Amplitude: 12 mV
Lead Channel Sensing Intrinsic Amplitude: 4 mV
Lead Channel Setting Pacing Amplitude: 1.5 V
Lead Channel Setting Pacing Amplitude: 3.5 V
Lead Channel Setting Pacing Amplitude: 3.5 V
Lead Channel Setting Pacing Pulse Width: 0.5 ms
Lead Channel Setting Pacing Pulse Width: 0.5 ms
Lead Channel Setting Sensing Sensitivity: 0.5 mV
Pulse Gen Serial Number: 111082542
Zone Setting Status: 755011

## 2024-02-10 MED ORDER — APIXABAN 5 MG PO TABS: 5.0000 mg | ORAL_TABLET | Freq: Two times a day (BID) | ORAL | 3 refills | Status: AC

## 2024-02-10 NOTE — Progress Notes (Addendum)
 Primary Care Physician: Sherial Bail, MD Primary Cardiologist: Elspeth Sage, MD (Inactive) Electrophysiologist: Elspeth Sage, MD (Inactive)     Referring Physician: Dr. Cindie / device clinic     [name] [name] is a 68 y.o. female with a history of CHB s/p PPM, ischemic cardiomyopathy, chronic systolic HF, PAD, aortic and coronary artery calcifications by CT imaging, HTN, and hypothyroidism who presents for consultation in the [facility]. 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 Hospital admission 10/24-27/2025 for left thoracotomy for LV epicardial lead placement. Device clinic alert on 10/29 for new Afib episode ongoing since 10/28 with not always good rate control.   On evaluation today, patient is currently in V paced rhythm. She does not have cardiac awareness.    Today, she denies symptoms of palpitations, chest pain, shortness of breath, orthopnea, PND, lower extremity edema, dizziness, presyncope, syncope, bleeding, or neurologic sequela. The patient is tolerating medications without difficulties and is otherwise without complaint today.    she has a BMI of Body mass index is 32.92 kg/m.SABRA Filed Weights   02/10/24 1429  Weight: 87 kg    Current Outpatient Medications  Medication Sig Dispense Refill   acetaminophen  (TYLENOL ) 325 MG tablet Take 2 tablets (650 mg total) by mouth every 6 (six) hours as needed for mild pain (pain score 1-3).     apixaban (ELIQUIS) 5 MG TABS tablet Take 1 tablet (5 mg total) by mouth 2 (two) times daily. 90 tablet 3   carvedilol  (COREG ) 6.25 MG tablet Take 1 tablet (6.25 mg total) by mouth 2 (two) times daily. 180 tablet 3   Cholecalciferol (VITAMIN D3) 25 MCG (1000 UT) CAPS Take 1,000 Units by mouth daily.     clonazePAM  (KLONOPIN ) 0.5 MG tablet TAKE 1 TABLET BY MOUTH ONCE DAILY AS NEEDED FOR ANXIETY/  SLEEP 30 tablet 0   cyanocobalamin (VITAMIN B12) 1000 MCG tablet Take 1,000 mcg by mouth daily.     furosemide  (LASIX ) 20 MG tablet Take 20 mg by mouth daily.     levothyroxine  (SYNTHROID ) 112 MCG tablet Take 1 tablet by mouth once daily 90 tablet 1   lovastatin  (MEVACOR ) 20 MG tablet TAKE 1 TABLET BY MOUTH AT BEDTIME 90 tablet 0   oxyCODONE  (OXY IR/ROXICODONE ) 5 MG immediate release tablet Take 1 tablet (5 mg total) by mouth every 6 (six) hours as needed for severe pain (pain score 7-10). 28 tablet 0   spironolactone  (ALDACTONE ) 25 MG tablet Take 12.5 mg by mouth daily.     No current facility-administered medications for this encounter.    Atrial Fibrillation Management history:  Previous antiarrhythmic drugs: none Previous cardioversions: none Previous ablations: none Anticoagulation history: none   ROS- All systems are reviewed and negative except as per the HPI above.  Physical Exam: BP 136/82   Pulse 70   Ht 5' 4 (1.626 m)   Wt 87 kg   LMP  (LMP Unknown)   BMI 32.92 kg/m   GEN: Well nourished, well developed in no acute distress NECK: No JVD; No carotid bruits CARDIAC: Regular rate (V paced rhythm), no murmurs, rubs, gallops RESPIRATORY:  Clear to auscultation without rales, wheezing or rhonchi  ABDOMEN: Soft, non-tender, non-distended EXTREMITIES:  No edema; No deformity   EKG today demonstrates  Vent. rate 70 BPM PR interval * ms QRS duration 150 ms QT/QTcB 482/520 ms P-R-T axes * 179 -24 Ventricular-paced  rhythm Abnormal ECG When compared with ECG of 03-Feb-2024 13:46, Previous ECG is present  Echo 11/02/23: CONCLUSION -------------------------------------------------------------------------------  SEVERE LEFT VENTRICULAR SYSTOLIC DYSFUNCTION WITH NO LVH  ESTIMATED EF: 20%, CALC EF(2D): 16%  DIASTOLIC FUNCTION CAN'T BE DETERMINED  NORMAL RIGHT VENTRICULAR SYSTOLIC FUNCTION  VALVULAR REGURGITATION: TRIVIAL AR, TRIVIAL MR, TRIVIAL PR, TRIVIAL TR   ESTIMATED RVSP: 37 mmHg (Normal)  NO VALVULAR STENOSIS  SMALL PERICARDIAL EFFUSION   ASSESSMENT & PLAN CHA2DS2-VASc Score = 5  The patient's score is based upon: CHF History: 1 HTN History: 1 Diabetes History: 0 Stroke History: 0 Vascular Disease History: 1 Age Score: 1 Gender Score: 1       ASSESSMENT AND PLAN: Paroxysmal Atrial Fibrillation (ICD10:  I48.0) The patient's CHA2DS2-VASc score is 5, indicating a 7.2% annual risk of stroke.    Patient is currently in V paced rhythm. Education provided about Afib. We will begin anticoagulation as noted below and reassess after 3 weeks of uninterrupted anticoagulation. We briefly discussed cardioversion procedure to help try to restore sinus rhythm in patients with persistent Afib. Patient's episode began yesterday so will reassess burden at next visit.    Secondary Hypercoagulable State (ICD10:  D68.69) The patient is at significant risk for stroke/thromboembolism based upon her CHA2DS2-VASc Score of 5.  Start Apixaban (Eliquis).  We discussed the reasoning behind anticoagulation in the setting of stroke prevention related to Afib. We discussed the benefits vs risks of anticoagulation. After discussion, patient would like to begin anticoagulation and understands the potential risks. Will begin Eliquis 5 mg BID and have patient follow up in several weeks to determine if cardioversion is needed. Stop ASA.    Follow up 3 weeks Afib clinic.    Terra Pac, [facility]  Afib Clinic [address] [place], [place] [address] [phone]

## 2024-02-10 NOTE — Telephone Encounter (Signed)
 Pt scheduled with Afib clinic this afternoon per Dr. Cindie recommendation.

## 2024-02-10 NOTE — Patient Instructions (Addendum)
 Stop aspirin  Start Eliquis 5mg  twice a day

## 2024-02-10 NOTE — Telephone Encounter (Signed)
 Alert received:  Alert remote transmission: AT/AF episode duration > threshold Presenting rhythm AF/BiV pace in progress from 10/28, not always good rate control, no OAC - route to triage high alert per protocol  Pt had epicardial LV lead placed February 05, 2024.  She has no history of Afib per review of chart.  Outreach made to Pt.  She denies noticing any difference in how she is feeling.  Denies any palpitations or fatigue.  She is a little short of breath after her procedure, but she is working on breathing exercises and states she is feeling better every day.  Advised would ask Dr. Cindie if he advises any change in therapy at this time and will follow up.

## 2024-02-10 NOTE — Addendum Note (Signed)
 Encounter addended by: Janel Nancy SAUNDERS, RN on: 02/10/2024 2:59 PM  Actions taken: Clinical Note Signed

## 2024-02-12 ENCOUNTER — Other Ambulatory Visit: Payer: Self-pay | Admitting: Surgery

## 2024-02-12 DIAGNOSIS — I442 Atrioventricular block, complete: Secondary | ICD-10-CM

## 2024-02-15 ENCOUNTER — Ambulatory Visit: Payer: PRIVATE HEALTH INSURANCE

## 2024-02-15 ENCOUNTER — Ambulatory Visit (HOSPITAL_COMMUNITY)
Admission: RE | Admit: 2024-02-15 | Discharge: 2024-02-15 | Disposition: A | Payer: PRIVATE HEALTH INSURANCE | Source: Ambulatory Visit | Attending: Internal Medicine

## 2024-02-15 VITALS — BP 121/77 | HR 93 | Resp 18 | Ht 64.0 in | Wt 188.0 lb

## 2024-02-15 DIAGNOSIS — I442 Atrioventricular block, complete: Secondary | ICD-10-CM | POA: Diagnosis present

## 2024-02-15 DIAGNOSIS — Z9889 Other specified postprocedural states: Secondary | ICD-10-CM

## 2024-02-15 DIAGNOSIS — I517 Cardiomegaly: Secondary | ICD-10-CM | POA: Insufficient documentation

## 2024-02-15 DIAGNOSIS — I700 Atherosclerosis of aorta: Secondary | ICD-10-CM | POA: Diagnosis not present

## 2024-02-15 DIAGNOSIS — Z95810 Presence of automatic (implantable) cardiac defibrillator: Secondary | ICD-10-CM | POA: Diagnosis not present

## 2024-02-15 DIAGNOSIS — R0989 Other specified symptoms and signs involving the circulatory and respiratory systems: Secondary | ICD-10-CM | POA: Diagnosis not present

## 2024-02-15 NOTE — Patient Instructions (Signed)
Follow up in 2 weeks with chest xray 

## 2024-02-15 NOTE — Progress Notes (Signed)
 [address] [place] [address]             [phone]       HPI:  Patient returns for routine postoperative follow-up having undergone Left anterolateral thoracotomy, Insertion of 2 left ventricular epicardial pacing leads, and Revision of Biventricular pacing system on 02/05/2024 with Dr. Lucas.  The patient's early postoperative recovery while in the hospital was uncomplicated. She had  lower blood pressures and her Entresto  was not restarted.  Her pacemaker system was interrogated and probed to be functioning appropriately. She was stable for discharge home on 02/08/2024.   Since hospital discharge the patient reports that she has been doing well.  She has been needed to take oxycodone  sometimes at night for pain.  She has been using tylenol  during the day.  She has been walking for activity.  She has had some shortness of breath with exertion but this has improved. She has not been able to check her BP at home and states that one has been ordered and should be delivered soon. She has had some lower leg edema (ankles and feet) but this started after she slept on her couch in a sitting position with her legs not elevated.  This has been improving.   Allergies as of 02/15/2024       Reactions   Marshmallow [althaea Officinalis]    Rash, hives, itching, SOB Pt states this is no longer an issue for her   Other    Jello - Rash, hives, itching, SOB Pt states is no longer an issue for her   Contrast Media [iodinated Contrast Media]    unknown   Latex Rash   Nickel Rash        Medication List        Accurate as of February 15, 2024  1:28 PM. If you have any questions, ask your nurse or doctor.          acetaminophen  325 MG tablet Commonly known as: Tylenol  Take 2 tablets (650 mg total) by mouth every 6 (six) hours as needed for mild pain (pain score 1-3).   apixaban 5 MG Tabs tablet Commonly known as: ELIQUIS Take 1 tablet (5 mg total) by  mouth 2 (two) times daily.   carvedilol  6.25 MG tablet Commonly known as: COREG  Take 1 tablet (6.25 mg total) by mouth 2 (two) times daily.   clonazePAM  0.5 MG tablet Commonly known as: KLONOPIN  TAKE 1 TABLET BY MOUTH ONCE DAILY AS NEEDED FOR ANXIETY/ SLEEP   cyanocobalamin 1000 MCG tablet Commonly known as: VITAMIN B12 Take 1,000 mcg by mouth daily.   furosemide  20 MG tablet Commonly known as: LASIX  Take 20 mg by mouth daily.   levothyroxine  112 MCG tablet Commonly known as: SYNTHROID  Take 1 tablet by mouth once daily   lovastatin  20 MG tablet Commonly known as: MEVACOR  TAKE 1 TABLET BY MOUTH AT BEDTIME   oxyCODONE  5 MG immediate release tablet Commonly known as: Oxy IR/ROXICODONE  Take 1 tablet (5 mg total) by mouth every 6 (six) hours as needed for severe pain (pain score 7-10).   spironolactone  25 MG tablet Commonly known as: ALDACTONE  Take 12.5 mg by mouth daily.   Vitamin D3 25 MCG (1000 UT) Caps Take 1,000 Units by mouth daily.         ROS Review of Systems  Constitutional:  Negative for malaise/fatigue.  Respiratory:  Positive for shortness of breath.  On exertion  Cardiovascular:  Positive for leg swelling. Negative for chest pain.      BP 121/77 (BP Location: Left Arm)   Pulse 93   Resp 18   Ht 5' 4 (1.626 m)   Wt 188 lb (85.3 kg)   LMP  (LMP Unknown)   SpO2 94%   BMI 32.27 kg/m   Physical Exam Constitutional:      Appearance: Normal appearance.  HENT:     Head: Normocephalic and atraumatic.  Cardiovascular:     Rate and Rhythm: Normal rate.     Heart sounds: Normal heart sounds, S1 normal and S2 normal.  Pulmonary:     Effort: Pulmonary effort is normal.     Breath sounds: Normal breath sounds.  Musculoskeletal:     Cervical back: Normal range of motion.  Skin:    General: Skin is warm and dry.      Neurological:     General: No focal deficit present.     Mental Status: She is alert.       Imaging: EXAM: 2 VIEW(S)  XRAY OF THE CHEST 02/15/2024 12:41:00 PM   COMPARISON: 02/07/2024   CLINICAL HISTORY: thoracotomy   FINDINGS:   LINES, TUBES AND DEVICES: Left chest wall AICD with leads overlying right atrium and right ventricle.   LUNGS AND PLEURA: Improved Left basilar airspace opacity likely representing atelectasis. Resolved right pleural effusion. Decreased tiny left pleural effusion. Mild pulmonary venous congestion, improved. No pneumothorax.   HEART AND MEDIASTINUM: Cardiomegaly. Aortic calcification.   BONES AND SOFT TISSUES: No acute osseous abnormality.   IMPRESSION: 1. Improved left basilar airspace opacity, likely postoperative atelectasis. 2. Mild pulmonary venous congestion, improved.   Electronically signed by: Rockey Kilts MD 02/15/2024 02:00 PM EST RP Workstation: [id number]   Assessment/Plan:  S/P thoracotomy -We reviewed today's chest x ray. Continue to use incentive spirometer  -We discussed driving and she is able to start.  First time driving should be a short distance in the day time and she can slowly increase from there. If she takes narcotic pain medication in the day time she should not drive.  -She has been seen by atrial fib clinic and was started on eliquis. She is to continue this as prescribed. She was to discontinue aspirin .  -Blood pressure not elevated at today's visit.  Continue to hold entresto .  She should start checking BP daily and bring readings to next cardiology visit for possible re-initiation of medication -She would like to return to work. Note provided with restrictions  -Follow up in 2 weeks with chest xray with TCTS.   Manuelita CHRISTELLA Rough, PA-C 1:28 PM 02/15/24

## 2024-02-16 NOTE — Progress Notes (Signed)
 Remote ICD Transmission

## 2024-02-22 ENCOUNTER — Inpatient Hospital Stay: Payer: PRIVATE HEALTH INSURANCE

## 2024-02-29 ENCOUNTER — Other Ambulatory Visit: Payer: Self-pay | Admitting: Surgery

## 2024-02-29 DIAGNOSIS — Z9889 Other specified postprocedural states: Secondary | ICD-10-CM

## 2024-03-01 ENCOUNTER — Ambulatory Visit: Payer: PRIVATE HEALTH INSURANCE

## 2024-03-01 ENCOUNTER — Other Ambulatory Visit (HOSPITAL_COMMUNITY): Payer: Self-pay

## 2024-03-01 ENCOUNTER — Ambulatory Visit (HOSPITAL_COMMUNITY)
Admission: RE | Admit: 2024-03-01 | Discharge: 2024-03-01 | Disposition: A | Discharge status: Home / Self Care | Payer: PRIVATE HEALTH INSURANCE | Source: Ambulatory Visit | Attending: Internal Medicine | Admitting: Internal Medicine

## 2024-03-01 ENCOUNTER — Encounter (HOSPITAL_COMMUNITY): Payer: Self-pay | Admitting: Internal Medicine

## 2024-03-01 VITALS — BP 124/88 | HR 70 | Ht 64.0 in | Wt 192.4 lb

## 2024-03-01 VITALS — BP 143/81 | HR 70 | Resp 18 | Ht 64.0 in | Wt 194.0 lb

## 2024-03-01 DIAGNOSIS — J90 Pleural effusion, not elsewhere classified: Secondary | ICD-10-CM | POA: Diagnosis not present

## 2024-03-01 DIAGNOSIS — I700 Atherosclerosis of aorta: Secondary | ICD-10-CM | POA: Diagnosis not present

## 2024-03-01 DIAGNOSIS — E039 Hypothyroidism, unspecified: Secondary | ICD-10-CM | POA: Insufficient documentation

## 2024-03-01 DIAGNOSIS — I442 Atrioventricular block, complete: Secondary | ICD-10-CM

## 2024-03-01 DIAGNOSIS — I110 Hypertensive heart disease with heart failure: Secondary | ICD-10-CM | POA: Insufficient documentation

## 2024-03-01 DIAGNOSIS — I5022 Chronic systolic (congestive) heart failure: Secondary | ICD-10-CM | POA: Diagnosis not present

## 2024-03-01 DIAGNOSIS — Z9889 Other specified postprocedural states: Secondary | ICD-10-CM

## 2024-03-01 DIAGNOSIS — D6869 Other thrombophilia: Secondary | ICD-10-CM | POA: Insufficient documentation

## 2024-03-01 DIAGNOSIS — Z950 Presence of cardiac pacemaker: Secondary | ICD-10-CM | POA: Insufficient documentation

## 2024-03-01 DIAGNOSIS — I739 Peripheral vascular disease, unspecified: Secondary | ICD-10-CM | POA: Insufficient documentation

## 2024-03-01 DIAGNOSIS — I255 Ischemic cardiomyopathy: Secondary | ICD-10-CM | POA: Insufficient documentation

## 2024-03-01 DIAGNOSIS — Z7901 Long term (current) use of anticoagulants: Secondary | ICD-10-CM | POA: Insufficient documentation

## 2024-03-01 DIAGNOSIS — I480 Paroxysmal atrial fibrillation: Secondary | ICD-10-CM | POA: Diagnosis not present

## 2024-03-01 DIAGNOSIS — I2510 Atherosclerotic heart disease of native coronary artery without angina pectoris: Secondary | ICD-10-CM | POA: Insufficient documentation

## 2024-03-01 DIAGNOSIS — I4819 Other persistent atrial fibrillation: Secondary | ICD-10-CM | POA: Diagnosis not present

## 2024-03-01 MED ORDER — CEPHALEXIN 500 MG PO CAPS
500.0000 mg | ORAL_CAPSULE | Freq: Two times a day (BID) | ORAL | 0 refills | Status: AC
  Filled 2024-03-01: qty 20, 10d supply, fill #0

## 2024-03-01 MED ORDER — POTASSIUM CHLORIDE CRYS ER 10 MEQ PO TBCR
10.0000 meq | EXTENDED_RELEASE_TABLET | Freq: Every day | ORAL | 0 refills | Status: DC
  Filled 2024-03-01: qty 7, 7d supply, fill #0

## 2024-03-01 NOTE — Patient Instructions (Addendum)
 Cardioversion scheduled for: Monday November 24th   - Arrive at the [facility] ([address]  and check in with ADMITTING at 9:30 AM   - Do not eat or drink anything after midnight the night prior to your procedure.   - Take all your morning medication (except diabetic medications) with a sip of water  prior to arrival.  - Do NOT miss any doses of your blood thinner - if you should miss a dose or take a dose more than 4 hours late -- please notify our office immediately.  - You will not be able to drive home after your procedure. Please ensure you have a responsible adult to drive you home. You will need someone with you for 24 hours post procedure.     - Expect to be in the procedural area approximately 2 hours.   - If you feel as if you go back into normal rhythm prior to scheduled cardioversion, please notify our office immediately.   If your procedure is canceled in the cardioversion suite you will be charged a cancellation fee.

## 2024-03-01 NOTE — Progress Notes (Unsigned)
 [address] [place] [address]             [phone]       HPI:  Patient returns for routine postoperative follow-up having undergone Left anterolateral thoracotomy, Insertion of 2 left ventricular epicardial pacing leads, and Revision of Biventricular pacing system on 02/05/2024 with Dr. Lucas.  The patient's early postoperative recovery while in the hospital was uncomplicated. She had  lower blood pressures and her Entresto  was not restarted.  Her pacemaker system was interrogated and probed to be functioning appropriately. She was stable for discharge home on 02/08/2024.   Sob for the past 5 days, with exertion Now redness below implantable device   coughing and linger post nasal drip since augutst   Allergies as of 03/01/2024       Reactions   Marshmallow [althaea Officinalis]    Rash, hives, itching, SOB Pt states this is no longer an issue for her   Other    Jello - Rash, hives, itching, SOB Pt states is no longer an issue for her   Contrast Media [iodinated Contrast Media]    unknown   Latex Rash   Nickel Rash        Medication List        Accurate as of March 01, 2024  3:11 PM. If you have any questions, ask your nurse or doctor.          acetaminophen  325 MG tablet Commonly known as: Tylenol  Take 2 tablets (650 mg total) by mouth every 6 (six) hours as needed for mild pain (pain score 1-3).   apixaban 5 MG Tabs tablet Commonly known as: ELIQUIS Take 1 tablet (5 mg total) by mouth 2 (two) times daily.   carvedilol  6.25 MG tablet Commonly known as: COREG  Take 1 tablet (6.25 mg total) by mouth 2 (two) times daily.   clonazePAM  0.5 MG tablet Commonly known as: KLONOPIN  TAKE 1 TABLET BY MOUTH ONCE DAILY AS NEEDED FOR ANXIETY/ SLEEP   cyanocobalamin 1000 MCG tablet Commonly known as: VITAMIN B12 Take 1,000 mcg by mouth daily.   furosemide  20 MG tablet Commonly known as: LASIX  Take 20 mg by mouth daily.    levothyroxine  112 MCG tablet Commonly known as: SYNTHROID  Take 1 tablet by mouth once daily   lovastatin  20 MG tablet Commonly known as: MEVACOR  TAKE 1 TABLET BY MOUTH AT BEDTIME   oxyCODONE  5 MG immediate release tablet Commonly known as: Oxy IR/ROXICODONE  Take 1 tablet (5 mg total) by mouth every 6 (six) hours as needed for severe pain (pain score 7-10).   spironolactone  25 MG tablet Commonly known as: ALDACTONE  Take 12.5 mg by mouth daily.   Vitamin D3 25 MCG (1000 UT) Caps Take 1,000 Units by mouth daily.         ROS Review of Systems  Constitutional:  Negative for malaise/fatigue.  Respiratory:  Positive for shortness of breath.        On exertion  Cardiovascular:  Positive for leg swelling. Negative for chest pain.      BP (!) 143/81   Pulse 70   Resp 18   Ht 5' 4 (1.626 m)   Wt 194 lb (88 kg)   LMP  (LMP Unknown)   SpO2 94%   BMI 33.30 kg/m   Physical Exam Constitutional:      Appearance: Normal appearance.  HENT:     Head: Normocephalic and atraumatic.  Cardiovascular:  Rate and Rhythm: Normal rate.     Heart sounds: Normal heart sounds, S1 normal and S2 normal.  Pulmonary:     Effort: Pulmonary effort is normal.     Breath sounds: Normal breath sounds.  Musculoskeletal:     Cervical back: Normal range of motion.  Skin:    General: Skin is warm and dry.      Neurological:     General: No focal deficit present.     Mental Status: She is alert.       Imaging: CLINICAL DATA:  Post thoracotomy.   EXAM: DG CHEST 2V   COMPARISON:  02/15/2024   FINDINGS: Left-sided pacemaker unchanged. Lungs are somewhat hypoinflated demonstrate worsening moderate size left effusion likely with associated compressive atelectasis in the left base. For small amount right pleural fluid. Stable cardiomegaly. Remainder of the exam is unchanged.   IMPRESSION: 1. Worsening moderate size left effusion likely with associated compressive atelectasis in  the left base. Small amount right pleural fluid. 2. Stable cardiomegaly.     Electronically Signed   By: Toribio Agreste M.D.   On: 03/01/2024 15:08   Assessment/Plan:  S/P thoracotomy   -Follow up in 2 weeks with chest xray with TCTS.   Manuelita HERO [place], PA-C 3:11 PM 03/01/24

## 2024-03-01 NOTE — Addendum Note (Signed)
 Encounter addended by: Terra Fairy PARAS, PA-C on: 03/01/2024 4:02 PM  Actions taken: Clinical Note Signed

## 2024-03-01 NOTE — Progress Notes (Addendum)
 Primary Care Physician: Sherial Bail, MD Primary Cardiologist: Elspeth Sage, MD (Inactive) Electrophysiologist: Elspeth Sage, MD (Inactive)     Referring Physician: Dr. Cindie / device clinic     [name] [name] is a 68 y.o. female with a history of CHB s/p PPM, ischemic cardiomyopathy, chronic systolic HF, PAD, aortic and coronary artery calcifications by CT imaging, HTN, and hypothyroidism who presents for consultation in the [facility]. 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 Hospital admission 10/24-27/2025 for left thoracotomy for LV epicardial lead placement. Device clinic alert on 10/29 for new Afib episode ongoing since 10/28 with not always good rate control.   On follow-up 03/01/2024, patient is currently in V-paced rhythm.  She began Eliquis 5 mg twice daily on 10/30 and has not missed any doses. She has an appointment with cardiothoracic surgery following this visit. She notes over the past few days to feel tired and have intermittent shortness of breath; no swelling noted. She noted today below to her device site some erythema that does not bother her. No bleeding issues on Eliquis.  Today, she denies symptoms of palpitations, chest pain, orthopnea, PND, lower extremity edema, dizziness, presyncope, syncope, bleeding, or neurologic sequela. The patient is tolerating medications without difficulties and is otherwise without complaint today.    she has a BMI of Body mass index is 33.03 kg/m.SABRA Filed Weights   03/01/24 1350  Weight: 87.3 kg     Current Outpatient Medications  Medication Sig Dispense Refill   acetaminophen  (TYLENOL ) 325 MG tablet Take 2 tablets (650 mg total) by mouth every 6 (six) hours as needed for mild pain (pain score 1-3).     apixaban (ELIQUIS) 5 MG TABS tablet Take 1 tablet (5 mg total) by mouth 2 (two) times  daily. 90 tablet 3   carvedilol  (COREG ) 6.25 MG tablet Take 1 tablet (6.25 mg total) by mouth 2 (two) times daily. 180 tablet 3   Cholecalciferol (VITAMIN D3) 25 MCG (1000 UT) CAPS Take 1,000 Units by mouth daily.     clonazePAM  (KLONOPIN ) 0.5 MG tablet TAKE 1 TABLET BY MOUTH ONCE DAILY AS NEEDED FOR ANXIETY/ SLEEP 30 tablet 0   cyanocobalamin (VITAMIN B12) 1000 MCG tablet Take 1,000 mcg by mouth daily.     furosemide  (LASIX ) 20 MG tablet Take 20 mg by mouth daily.     levothyroxine  (SYNTHROID ) 112 MCG tablet Take 1 tablet by mouth once daily 90 tablet 1   lovastatin  (MEVACOR ) 20 MG tablet TAKE 1 TABLET BY MOUTH AT BEDTIME 90 tablet 0   oxyCODONE  (OXY IR/ROXICODONE ) 5 MG immediate release tablet Take 1 tablet (5 mg total) by mouth every 6 (six) hours as needed for severe pain (pain score 7-10). 28 tablet 0   spironolactone  (ALDACTONE ) 25 MG tablet Take 12.5 mg by mouth daily.     No current facility-administered medications for this encounter.    Atrial Fibrillation Management history:  Previous antiarrhythmic drugs: none Previous cardioversions: none Previous ablations: none Anticoagulation history: Eliquis   ROS- All systems are reviewed and negative except as per the HPI above.  Physical Exam: BP 124/88   Pulse 70   Ht 5' 4 (1.626 m)   Wt 87.3 kg   LMP  (LMP Unknown)   BMI 33.03 kg/m   GEN- The patient is well appearing, alert and oriented x 3 today.   Neck - no JVD or  carotid bruit noted Lungs- Clear right but possibly diminished left base sounds? Heart- Regular rate (V paced), no murmurs, rubs or gallops, PMI not laterally displaced Extremities- no clubbing, cyanosis, or edema Skin - Erythema noted in two spots inferior to device pocket. It is not tender to touch. Device scar is well healed and thoracotomy scar is well healed without signs of erythema or drainage.    EKG today demonstrates  Vent. rate 70 BPM PR interval * ms QRS duration 142 ms QT/QTcB 456/492  ms P-R-T axes * 198 15 Ventricular-paced rhythm Abnormal ECG When compared with ECG of 10-Feb-2024 14:33, No significant change was found  Echo 11/02/23: CONCLUSION -------------------------------------------------------------------------------  SEVERE LEFT VENTRICULAR SYSTOLIC DYSFUNCTION WITH NO LVH  ESTIMATED EF: 20%, CALC EF(2D): 16%  DIASTOLIC FUNCTION CAN'T BE DETERMINED  NORMAL RIGHT VENTRICULAR SYSTOLIC FUNCTION  VALVULAR REGURGITATION: TRIVIAL AR, TRIVIAL MR, TRIVIAL PR, TRIVIAL TR  ESTIMATED RVSP: 37 mmHg (Normal)  NO VALVULAR STENOSIS  SMALL PERICARDIAL EFFUSION   ASSESSMENT & PLAN CHA2DS2-VASc Score = 5  The patient's score is based upon: CHF History: 1 HTN History: 1 Diabetes History: 0 Stroke History: 0 Vascular Disease History: 1 Age Score: 1 Gender Score: 1       ASSESSMENT AND PLAN: Persistent Atrial Fibrillation (ICD10:  I48.0) The patient's CHA2DS2-VASc score is 5, indicating a 7.2% annual risk of stroke.    Patient is currently in V paced rhythm. We discussed the procedure cardioversion to try to convert to NSR. We discussed the risks vs benefits of this procedure and how ultimately we cannot predict whether a patient will have early return of arrhythmia post procedure. After discussion, the patient wishes to proceed with cardioversion. Labs drawn on 10/26 stable.  Informed Consent   Shared Decision Making/Informed Consent The risks (stroke, cardiac arrhythmias rarely resulting in the need for a temporary or permanent pacemaker, skin irritation or burns and complications associated with conscious sedation including aspiration, arrhythmia, respiratory failure and death), benefits (restoration of normal sinus rhythm) and alternatives of a direct current cardioversion were explained in detail to [name] [name] and she agrees to proceed.      Secondary Hypercoagulable State (ICD10:  D68.69) The patient is at significant risk for stroke/thromboembolism based upon  her CHA2DS2-VASc Score of 5.  Continue Apixaban (Eliquis).  No missed doses of Eliquis.   S/p left thoracotomy Thoracotomy incision appears well-healing and is clean dry and intact.  There is no erythema surrounding the thoracotomy scar.  The 2 spots of erythema I noted on physical exam appear inferior to the device pocket but the device scar itself does not have erythema.  Will defer if patient requires antibiotic coverage to cardiothoracic surgery team being seen by them following this appointment.    Follow up with TCTS as scheduled. Follow up after cardioversion with EP as scheduled.    Terra Pac, [facility]  Af[facility] Clinic [address] [place], [place] [address] [phone]

## 2024-03-02 NOTE — Patient Instructions (Signed)
-  Take lasix  20 mg daily with postassium 10 meq for 7 days. Continue with spironolactone  as prescribed -Take Keflex 500mg  twice daily for 7 days

## 2024-03-03 ENCOUNTER — Encounter: Payer: Self-pay | Admitting: Cardiology

## 2024-03-07 ENCOUNTER — Encounter (HOSPITAL_COMMUNITY)
Admission: RE | Disposition: A | Discharge status: Home / Self Care | Payer: Self-pay | Source: Home / Self Care | Attending: Cardiovascular Disease

## 2024-03-07 ENCOUNTER — Ambulatory Visit (HOSPITAL_COMMUNITY): Payer: PRIVATE HEALTH INSURANCE | Admitting: Anesthesiology

## 2024-03-07 ENCOUNTER — Other Ambulatory Visit: Payer: Self-pay

## 2024-03-07 ENCOUNTER — Encounter (HOSPITAL_COMMUNITY): Payer: Self-pay | Admitting: Cardiovascular Disease

## 2024-03-07 ENCOUNTER — Ambulatory Visit (HOSPITAL_COMMUNITY)
Admission: RE | Admit: 2024-03-07 | Discharge: 2024-03-07 | Disposition: A | Discharge status: Home / Self Care | Payer: PRIVATE HEALTH INSURANCE | Attending: Cardiovascular Disease | Admitting: Cardiovascular Disease

## 2024-03-07 DIAGNOSIS — Z87891 Personal history of nicotine dependence: Secondary | ICD-10-CM | POA: Diagnosis not present

## 2024-03-07 DIAGNOSIS — I4819 Other persistent atrial fibrillation: Secondary | ICD-10-CM

## 2024-03-07 DIAGNOSIS — I4891 Unspecified atrial fibrillation: Secondary | ICD-10-CM

## 2024-03-07 DIAGNOSIS — I5022 Chronic systolic (congestive) heart failure: Secondary | ICD-10-CM | POA: Diagnosis not present

## 2024-03-07 DIAGNOSIS — I110 Hypertensive heart disease with heart failure: Secondary | ICD-10-CM | POA: Diagnosis not present

## 2024-03-07 HISTORY — PX: CARDIOVERSION: EP1203

## 2024-03-07 LAB — POCT I-STAT, CHEM 8
BUN: 11 mg/dL (ref 8–23)
Calcium, Ion: 1.19 mmol/L (ref 1.15–1.40)
Chloride: 99 mmol/L (ref 98–111)
Creatinine, Ser: 0.8 mg/dL (ref 0.44–1.00)
Glucose, Bld: 124 mg/dL — ABNORMAL HIGH (ref 70–99)
HCT: 37 % (ref 36.0–46.0)
Hemoglobin: 12.6 g/dL (ref 12.0–15.0)
Potassium: 4.3 mmol/L (ref 3.5–5.1)
Sodium: 137 mmol/L (ref 135–145)
TCO2: 28 mmol/L (ref 22–32)

## 2024-03-07 SURGERY — CARDIOVERSION (CATH LAB)
Anesthesia: General

## 2024-03-07 MED ORDER — SODIUM CHLORIDE 0.9 % IV SOLN: INTRAVENOUS | Status: DC

## 2024-03-07 MED ORDER — PROPOFOL 10 MG/ML IV BOLUS
INTRAVENOUS | Status: DC | PRN
  Administered 2024-03-07: 40 mg via INTRAVENOUS

## 2024-03-07 MED ORDER — PHENYLEPHRINE 80 MCG/ML (10ML) SYRINGE FOR IV PUSH (FOR BLOOD PRESSURE SUPPORT)
PREFILLED_SYRINGE | INTRAVENOUS | Status: DC | PRN
  Administered 2024-03-07: 80 ug via INTRAVENOUS

## 2024-03-07 MED ORDER — LIDOCAINE 2% (20 MG/ML) 5 ML SYRINGE
INTRAMUSCULAR | Status: DC | PRN
  Administered 2024-03-07: 80 mg via INTRAVENOUS

## 2024-03-07 SURGICAL SUPPLY — 1 items: PAD DEFIB RADIO PHYSIO CONN (PAD) ×1 IMPLANT

## 2024-03-07 NOTE — CV Procedure (Signed)
 Electrical Cardioversion Procedure Note [name] [name] [id number] 1955-05-05  Procedure: Electrical Cardioversion Indications:  Atrial Fibrillation  Procedure Details Consent: Risks of procedure as well as the alternatives and risks of each were explained to the (patient/caregiver).  Consent for procedure obtained. Time Out: Verified patient identification, verified procedure, site/side was marked, verified correct patient position, special equipment/implants available, medications/allergies/relevent history reviewed, required imaging and test results available.  Performed  Patient placed on cardiac monitor, pulse oximetry, supplemental oxygen  as necessary.  Sedation given: propofol  Pacer pads placed anterior and posterior chest.  Cardioverted 1 time(s).  Cardioverted at 200J.  Evaluation Findings: Post procedure EKG shows: NSR Complications: None Patient did tolerate procedure well.   Annabella Scarce, MD 03/07/2024, 10:18 AM

## 2024-03-07 NOTE — Discharge Instructions (Signed)

## 2024-03-07 NOTE — Transfer of Care (Signed)
 Immediate Anesthesia Transfer of Care Note  Patient: [name] [name]  Procedure(s) Performed: CARDIOVERSION  Patient Location: PACU and Cath Lab  Anesthesia Type:General  Level of Consciousness: drowsy  Airway & Oxygen  Therapy: Patient Spontanous Breathing and Patient connected to nasal cannula oxygen   Post-op Assessment: Report given to RN and Post -op Vital signs reviewed and stable  Post vital signs: Reviewed and stable  Last Vitals:  Vitals Value Taken Time  BP 112/70   Temp    Pulse 80   Resp 22   SpO2 97     Last Pain:  Vitals:   03/07/24 0905  TempSrc: Temporal         Complications: No notable events documented.

## 2024-03-07 NOTE — Anesthesia Preprocedure Evaluation (Signed)
 Anesthesia Evaluation  Patient identified by MRN, date of birth, ID band Patient awake    Reviewed: Allergy & Precautions, NPO status , Patient's Chart, lab work & pertinent test results, reviewed documented beta blocker date and time   Airway Mallampati: II  TM Distance: >3 FB Neck ROM: Full    Dental no notable dental hx. (+) Teeth Intact, Dental Advisory Given   Pulmonary former smoker   Pulmonary exam normal breath sounds clear to auscultation       Cardiovascular hypertension, Pt. on home beta blockers and Pt. on medications + angina  + Peripheral Vascular Disease and +CHF  + dysrhythmias (eliquis ) Atrial Fibrillation + pacemaker + Cardiac Defibrillator  Rhythm:Irregular Rate:Normal  TTE 2025 Echo 11/02/23: CONCLUSION -------------------------------------------------------------------------------  SEVERE LEFT VENTRICULAR SYSTOLIC DYSFUNCTION WITH NO LVH  ESTIMATED EF: 20%, CALC EF(2D): 16%  DIASTOLIC FUNCTION CAN'T BE DETERMINED  NORMAL RIGHT VENTRICULAR SYSTOLIC FUNCTION  VALVULAR REGURGITATION: TRIVIAL AR, TRIVIAL MR, TRIVIAL PR, TRIVIAL TR  ESTIMATED RVSP: 37 mmHg (Normal)  NO VALVULAR STENOSIS  SMALL PERICARDIAL EFFUSION     Neuro/Psych  Headaches  negative psych ROS   GI/Hepatic Neg liver ROS,GERD  ,,  Endo/Other  Hypothyroidism    Renal/GU negative Renal ROS  negative genitourinary   Musculoskeletal  (+) Arthritis ,    Abdominal   Peds  Hematology negative hematology ROS (+)   Anesthesia Other Findings   Reproductive/Obstetrics                              Anesthesia Physical Anesthesia Plan  ASA: 4  Anesthesia Plan: General   Post-op Pain Management:    Induction: Intravenous  PONV Risk Score and Plan: Propofol  infusion and Treatment may vary due to age or medical condition  Airway Management Planned: Natural Airway  Additional Equipment:   Intra-op Plan:    Post-operative Plan:   Informed Consent: I have reviewed the patients History and Physical, chart, labs and discussed the procedure including the risks, benefits and alternatives for the proposed anesthesia with the patient or authorized representative who has indicated his/her understanding and acceptance.     Dental advisory given  Plan Discussed with: CRNA  Anesthesia Plan Comments:          Anesthesia Quick Evaluation

## 2024-03-09 NOTE — Anesthesia Postprocedure Evaluation (Signed)
 Anesthesia Post Note  Patient: [name] [name]  Procedure(s) Performed: CARDIOVERSION     Patient location during evaluation: Cath Lab Anesthesia Type: General Level of consciousness: awake and alert Pain management: pain level controlled Vital Signs Assessment: post-procedure vital signs reviewed and stable Respiratory status: spontaneous breathing, nonlabored ventilation, respiratory function stable and patient connected to nasal cannula oxygen  Cardiovascular status: blood pressure returned to baseline and stable Postop Assessment: no apparent nausea or vomiting Anesthetic complications: no   No notable events documented.  Last Vitals:  Vitals:   03/07/24 1031 03/07/24 1041  BP: 115/68 107/67  Pulse: 76 73  Resp: 17 20  Temp:    SpO2: 95% 96%    Last Pain:  Vitals:   03/07/24 1041  TempSrc:   PainSc: 0-No pain                 Ilia Engelbert L Jarrick Fjeld

## 2024-03-14 ENCOUNTER — Other Ambulatory Visit: Payer: Self-pay | Admitting: Surgery

## 2024-03-14 DIAGNOSIS — Z9889 Other specified postprocedural states: Secondary | ICD-10-CM

## 2024-03-15 ENCOUNTER — Emergency Department (HOSPITAL_COMMUNITY): Payer: PRIVATE HEALTH INSURANCE

## 2024-03-15 ENCOUNTER — Inpatient Hospital Stay (HOSPITAL_COMMUNITY)
Admission: EM | Admit: 2024-03-15 | Discharge: 2024-03-22 | DRG: 286 | Disposition: A | Payer: PRIVATE HEALTH INSURANCE | Source: Ambulatory Visit | Attending: Internal Medicine | Admitting: Internal Medicine

## 2024-03-15 ENCOUNTER — Ambulatory Visit: Payer: PRIVATE HEALTH INSURANCE

## 2024-03-15 ENCOUNTER — Other Ambulatory Visit: Payer: Self-pay

## 2024-03-15 ENCOUNTER — Inpatient Hospital Stay (HOSPITAL_COMMUNITY): Payer: PRIVATE HEALTH INSURANCE

## 2024-03-15 ENCOUNTER — Other Ambulatory Visit (HOSPITAL_COMMUNITY): Payer: PRIVATE HEALTH INSURANCE

## 2024-03-15 DIAGNOSIS — I4892 Unspecified atrial flutter: Secondary | ICD-10-CM

## 2024-03-15 DIAGNOSIS — I442 Atrioventricular block, complete: Secondary | ICD-10-CM | POA: Diagnosis present

## 2024-03-15 DIAGNOSIS — E871 Hypo-osmolality and hyponatremia: Principal | ICD-10-CM | POA: Diagnosis present

## 2024-03-15 DIAGNOSIS — R7989 Other specified abnormal findings of blood chemistry: Secondary | ICD-10-CM

## 2024-03-15 DIAGNOSIS — E039 Hypothyroidism, unspecified: Secondary | ICD-10-CM | POA: Diagnosis present

## 2024-03-15 DIAGNOSIS — R319 Hematuria, unspecified: Secondary | ICD-10-CM

## 2024-03-15 DIAGNOSIS — I428 Other cardiomyopathies: Secondary | ICD-10-CM | POA: Diagnosis present

## 2024-03-15 DIAGNOSIS — N179 Acute kidney failure, unspecified: Secondary | ICD-10-CM

## 2024-03-15 DIAGNOSIS — I4819 Other persistent atrial fibrillation: Secondary | ICD-10-CM | POA: Diagnosis present

## 2024-03-15 DIAGNOSIS — E7800 Pure hypercholesterolemia, unspecified: Secondary | ICD-10-CM | POA: Diagnosis present

## 2024-03-15 DIAGNOSIS — I10 Essential (primary) hypertension: Secondary | ICD-10-CM | POA: Diagnosis present

## 2024-03-15 DIAGNOSIS — I480 Paroxysmal atrial fibrillation: Secondary | ICD-10-CM

## 2024-03-15 DIAGNOSIS — Z91041 Radiographic dye allergy status: Secondary | ICD-10-CM

## 2024-03-15 DIAGNOSIS — J90 Pleural effusion, not elsewhere classified: Secondary | ICD-10-CM

## 2024-03-15 DIAGNOSIS — I5043 Acute on chronic combined systolic (congestive) and diastolic (congestive) heart failure: Secondary | ICD-10-CM | POA: Diagnosis present

## 2024-03-15 LAB — BASIC METABOLIC PANEL WITH GFR
Anion gap: 14 (ref 5–15)
Anion gap: 10 (ref 5–15)
BUN: 22 mg/dL (ref 8–23)
BUN: 19 mg/dL (ref 8–23)
CO2: 21 mmol/L — ABNORMAL LOW (ref 22–32)
CO2: 22 mmol/L (ref 22–32)
Calcium: 9.5 mg/dL (ref 8.9–10.3)
Calcium: 9 mg/dL (ref 8.9–10.3)
Chloride: 84 mmol/L — ABNORMAL LOW (ref 98–111)
Chloride: 86 mmol/L — ABNORMAL LOW (ref 98–111)
Creatinine, Ser: 1.25 mg/dL — ABNORMAL HIGH (ref 0.44–1.00)
Creatinine, Ser: 1.1 mg/dL — ABNORMAL HIGH (ref 0.44–1.00)
GFR, Estimated: 48 mL/min — ABNORMAL LOW (ref >60)
GFR, Estimated: 55 mL/min — ABNORMAL LOW (ref >60)
Glucose, Bld: 168 mg/dL — ABNORMAL HIGH (ref 70–99)
Glucose, Bld: 132 mg/dL — ABNORMAL HIGH (ref 70–99)
Potassium: 5.2 mmol/L — ABNORMAL HIGH (ref 3.5–5.1)
Potassium: 4.4 mmol/L (ref 3.5–5.1)
Sodium: 119 mmol/L — CL (ref 135–145)
Sodium: 118 mmol/L — CL (ref 135–145)

## 2024-03-15 LAB — OSMOLALITY: Osmolality: 264 mosm/kg — ABNORMAL LOW (ref 275–295)

## 2024-03-15 LAB — URINALYSIS, ROUTINE W REFLEX MICROSCOPIC
Bilirubin Urine: NEGATIVE
Glucose, UA: NEGATIVE mg/dL
Ketones, ur: NEGATIVE mg/dL
Leukocytes,Ua: NEGATIVE
Nitrite: NEGATIVE
Protein, ur: 100 mg/dL — AB
Specific Gravity, Urine: 1.021 (ref 1.005–1.030)
pH: 5 (ref 5.0–8.0)

## 2024-03-15 LAB — CBC
HCT: 38.7 % (ref 36.0–46.0)
Hemoglobin: 13.7 g/dL (ref 12.0–15.0)
MCH: 31.2 pg (ref 26.0–34.0)
MCHC: 35.4 g/dL (ref 30.0–36.0)
MCV: 88.2 fL (ref 80.0–100.0)
Platelets: 303 K/uL (ref 150–400)
RBC: 4.39 MIL/uL (ref 3.87–5.11)
RDW: 13 % (ref 11.5–15.5)
WBC: 16 K/uL — ABNORMAL HIGH (ref 4.0–10.5)
nRBC: 0 % (ref 0.0–0.2)

## 2024-03-15 LAB — BRAIN NATRIURETIC PEPTIDE: B Natriuretic Peptide: 741.8 pg/mL — ABNORMAL HIGH (ref 0.0–100.0)

## 2024-03-15 LAB — TROPONIN I (HIGH SENSITIVITY)
Troponin I (High Sensitivity): 24 ng/L — ABNORMAL HIGH (ref <18)
Troponin I (High Sensitivity): 21 ng/L — ABNORMAL HIGH (ref <18)

## 2024-03-15 LAB — LACTATE DEHYDROGENASE, PLEURAL OR PERITONEAL FLUID: LD, Fluid: 61 U/L — ABNORMAL HIGH (ref 3–23)

## 2024-03-15 LAB — OSMOLALITY, URINE: Osmolality, Ur: 151 mosm/kg — ABNORMAL LOW (ref 300–900)

## 2024-03-15 LAB — MAGNESIUM: Magnesium: 1.7 mg/dL (ref 1.7–2.4)

## 2024-03-15 LAB — GLUCOSE, CAPILLARY: Glucose-Capillary: 161 mg/dL — ABNORMAL HIGH (ref 70–99)

## 2024-03-15 LAB — SODIUM: Sodium: 120 mmol/L — ABNORMAL LOW (ref 135–145)

## 2024-03-15 LAB — SODIUM, URINE, RANDOM: Sodium, Ur: <30 mmol/L

## 2024-03-15 LAB — LACTATE DEHYDROGENASE: LDH: 117 U/L (ref 105–235)

## 2024-03-15 LAB — MRSA NEXT GEN BY PCR, NASAL: MRSA by PCR Next Gen: NOT DETECTED

## 2024-03-15 LAB — PROTEIN, PLEURAL OR PERITONEAL FLUID: Total protein, fluid: 4.3 g/dL

## 2024-03-15 LAB — HIV ANTIBODY (ROUTINE TESTING W REFLEX): HIV Screen 4th Generation wRfx: NONREACTIVE

## 2024-03-15 LAB — PROTEIN, TOTAL: Total Protein: 6.7 g/dL (ref 6.5–8.1)

## 2024-03-15 MED ORDER — SPIRONOLACTONE 12.5 MG HALF TABLET
12.5000 mg | ORAL_TABLET | Freq: Every day | ORAL | Status: DC
  Administered 2024-03-16 – 2024-03-19 (×4): 12.5 mg via ORAL
  Filled 2024-03-15 (×4): qty 1

## 2024-03-15 MED ORDER — CARVEDILOL 3.125 MG PO TABS: 6.2500 mg | ORAL_TABLET | Freq: Two times a day (BID) | ORAL | Status: DC

## 2024-03-15 MED ORDER — FUROSEMIDE 10 MG/ML IJ SOLN
40.0000 mg | Freq: Two times a day (BID) | INTRAMUSCULAR | Status: AC
  Administered 2024-03-15 – 2024-03-16 (×2): 40 mg via INTRAVENOUS
  Filled 2024-03-15 (×2): qty 4

## 2024-03-15 MED ORDER — DOCUSATE SODIUM 100 MG PO CAPS
100.0000 mg | ORAL_CAPSULE | Freq: Two times a day (BID) | ORAL | Status: DC | PRN
  Administered 2024-03-15 – 2024-03-16 (×2): 100 mg via ORAL
  Filled 2024-03-15 (×2): qty 1

## 2024-03-15 MED ORDER — CARVEDILOL 6.25 MG PO TABS
6.2500 mg | ORAL_TABLET | Freq: Two times a day (BID) | ORAL | Status: DC
  Administered 2024-03-15 – 2024-03-22 (×14): 6.25 mg via ORAL
  Filled 2024-03-15 (×2): qty 1
  Filled 2024-03-15: qty 2
  Filled 2024-03-15 (×3): qty 1
  Filled 2024-03-15: qty 2
  Filled 2024-03-15 (×3): qty 1
  Filled 2024-03-15 (×2): qty 2
  Filled 2024-03-15: qty 1
  Filled 2024-03-15: qty 2

## 2024-03-15 MED ORDER — SPIRONOLACTONE 12.5 MG HALF TABLET: 12.5000 mg | ORAL_TABLET | Freq: Every day | ORAL | Status: DC

## 2024-03-15 MED ORDER — PRAVASTATIN SODIUM 40 MG PO TABS
20.0000 mg | ORAL_TABLET | Freq: Every day | ORAL | Status: DC
  Administered 2024-03-16 – 2024-03-21 (×6): 20 mg via ORAL
  Filled 2024-03-15: qty 1
  Filled 2024-03-15: qty 2
  Filled 2024-03-15 (×2): qty 1
  Filled 2024-03-15: qty 2
  Filled 2024-03-15: qty 1

## 2024-03-15 MED ORDER — CLONAZEPAM 0.5 MG PO TABS
0.5000 mg | ORAL_TABLET | Freq: Three times a day (TID) | ORAL | Status: DC | PRN
  Administered 2024-03-17 – 2024-03-21 (×5): 0.5 mg via ORAL
  Filled 2024-03-15 (×5): qty 1

## 2024-03-15 MED ORDER — LEVOTHYROXINE SODIUM 112 MCG PO TABS: 112.0000 ug | ORAL_TABLET | Freq: Every day | ORAL | Status: DC

## 2024-03-15 MED ORDER — APIXABAN 5 MG PO TABS
5.0000 mg | ORAL_TABLET | Freq: Two times a day (BID) | ORAL | Status: DC
  Administered 2024-03-15 – 2024-03-16 (×3): 5 mg via ORAL
  Filled 2024-03-15 (×3): qty 1

## 2024-03-15 MED ORDER — ACETAMINOPHEN 325 MG PO TABS
650.0000 mg | ORAL_TABLET | Freq: Four times a day (QID) | ORAL | Status: DC | PRN
  Administered 2024-03-16 – 2024-03-17 (×2): 650 mg via ORAL
  Filled 2024-03-15 (×2): qty 2

## 2024-03-15 MED ORDER — LEVOTHYROXINE SODIUM 112 MCG PO TABS
112.0000 ug | ORAL_TABLET | Freq: Every day | ORAL | Status: DC
  Administered 2024-03-15 – 2024-03-21 (×7): 112 ug via ORAL
  Filled 2024-03-15 (×7): qty 1

## 2024-03-15 MED ORDER — CHLORHEXIDINE GLUCONATE CLOTH 2 % EX PADS
6.0000 | MEDICATED_PAD | Freq: Every day | CUTANEOUS | Status: DC
  Administered 2024-03-15 – 2024-03-21 (×5): 6 via TOPICAL

## 2024-03-15 MED ORDER — CLONAZEPAM 0.5 MG PO TABS
0.5000 mg | ORAL_TABLET | Freq: Once | ORAL | Status: AC
  Administered 2024-03-15: 0.5 mg via ORAL
  Filled 2024-03-15: qty 1

## 2024-03-15 MED ORDER — FUROSEMIDE 10 MG/ML IJ SOLN
80.0000 mg | Freq: Once | INTRAMUSCULAR | Status: AC
  Administered 2024-03-15: 80 mg via INTRAVENOUS
  Filled 2024-03-15: qty 8

## 2024-03-15 MED ORDER — OXYCODONE HCL 5 MG PO TABS
5.0000 mg | ORAL_TABLET | Freq: Four times a day (QID) | ORAL | Status: DC | PRN
  Administered 2024-03-15 – 2024-03-16 (×2): 5 mg via ORAL
  Filled 2024-03-15 (×3): qty 1

## 2024-03-15 MED ORDER — POLYETHYLENE GLYCOL 3350 17 G PO PACK: 17.0000 g | PACK | Freq: Every day | ORAL | Status: DC | PRN

## 2024-03-15 NOTE — Procedures (Signed)
 Thoracentesis  Procedure Note  [name] [name]  [id number]  [birth year]  Date:03/15/24  Time:6:52 PM   Provider Performing:Oleg Oleson   Procedure: Thoracentesis with imaging guidance ([id number])  Indication(s) Pleural Effusion  Consent Risks of the procedure as well as the alternatives and risks of each were explained to the patient and/or caregiver.  Consent for the procedure was obtained and is signed in the bedside chart  Anesthesia Topical only with 1% lidocaine     Time Out Verified patient identification, verified procedure, site/side was marked, verified correct patient position, special equipment/implants available, medications/allergies/relevant history reviewed, required imaging and test results available.   Sterile Technique Maximal sterile technique including full sterile barrier drape, hand hygiene, sterile gown, sterile gloves, mask, hair covering, sterile ultrasound probe cover (if used).  Procedure Description Ultrasound was used to identify appropriate pleural anatomy for placement and overlying skin marked.  Area of drainage cleaned and draped in sterile fashion. Lidocaine  was used to anesthetize the skin and subcutaneous tissue.  1300 cc's of yellowish appearing fluid was drained from the left pleural space. Catheter then removed and bandaid applied to site.   Complications/Tolerance None; patient tolerated the procedure well. Chest X-ray is ordered to confirm no post-procedural complication.   EBL Minimal   Specimen(s) Pleural fluid

## 2024-03-15 NOTE — ED Notes (Signed)
Wheeled patient to the bathroom patient did well patient is now back in bed on the monitor with call bell in reach

## 2024-03-15 NOTE — ED Triage Notes (Signed)
 Pt. Stated, Jesus been SOB really bad for the last 2 days, some chest discomfort, unable to sleep or eat.

## 2024-03-15 NOTE — H&P (Signed)
 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  She stated that she is not able to sleep because of shortness of breath and it gets worse with exertion.  She complained of chest tightness especially when she tries to take deep breath, dull in nature denies radiation or exacerbation.  She stated that has been compliant with medications but overall drinking more water  in last week then she has been.  Patient denies fever, chills, nausea, vomiting, urgency, frequency or other complaints.  Of note patient underwent cardioversion on 11/24, currently in sinus rhythm at that time TEE showed her EF was 20 to 25% In the emergency department workup suggestive of serum sodium of 119, she was given IV Lasix , repeat serum sodium came back 118, PCCM was consulted for evaluation and ICU admission  Pertinent  Medical History   Past Medical History:  Diagnosis Date   AICD (automatic cardioverter/defibrillator) present    St. Jude   Anxiety    takes Klonopin  daily as needed   Arthritis    Back pain    reason unknown   Chronic systolic CHF (congestive heart failure) (HCC)    a. Echo 10/2013: EF 40-45 (apical images 35-40%); b. Echo 12/2014: EF 35-40%; c. 06/2020 Echo: EF 25-30%, glob HK, Gr2 DD, RVSP ~ ; d. 10/2020 Echo: EF 25-30%, glob HK. Nl RV fxn, nl PASP, mildly dil LA. Small peric eff, triv MR.   Complete AV block (HCC)    a. s/p pacemaker insertion 1996; b. s/p contralateral side St. Jude generator change 11/2014.   Dizziness     occasionally   GERD (gastroesophageal reflux disease)    takes Zantac  daily as needed   Gout    Headache    occasionally   History of blood clots    in heart-was on Coumadin---this many yrs ago   History of colon polyps    benign   Hyperlipidemia    takes Lovastatin  daily   Hypertension    hx of-since hip replacement MD took resident off meds 10/30/14 b/c well controlled   Hypothyroid    takes Synthroid  daily   Joint pain    Joint swelling    Moderate Pericardial effusion    a. 07/2020 Echo: Mod effusion w/o tamponade; b. 10/2020 Echo: small peric eff w/o tamponade.   NICM (nonischemic cardiomyopathy) (HCC)    a. tachycardia induced->EF 40-45% (2010), 35-40% (10/2013), 35-40% Gr1 DD, mod TR (12/2014);  b. 01/2015 Cath: Nl cors, EF 30-35%, glob HK; c. 06/2020 Echo: EF 25-30%; d. 10/2020 Echo: EF 25-30%.   Numbness and tingling    left leg and right arm   Ovarian cancer (HCC)    ovarian, skin    Pacemaker lead failure--noise on atrial greater than ventricular lead with ventricular backup pulse pacing 10/17/2014   Peripheral artery disease    stent on lt illiac     Significant Hospital Events: Including procedures, antibiotic start and stop dates in addition to other pertinent events   12/2 admitted with acute on chronic HFrEF/hyponatremia  Interim History / Subjective:  As  above  Objective    Blood pressure 125/73, pulse 70, temperature (!) 97.5 F (36.4 C), temperature source Oral, resp. rate (!) 23, SpO2 96%.       No intake or output data in the 24 hours ending 03/15/24 1256 There were no vitals filed for this visit.  Examination: General: Acutely ill-appearing female, lying on the bed HEENT: [place]/AT, eyes anicteric.  moist mucus membranes Neuro: Alert, awake following commands, antigravity in all 4 extremities Chest: Fine crackles at bases bilaterally, no wheezes or rhonchi Heart: Regular rate and rhythm, no murmurs or gallops Abdomen: Soft, nontender, nondistended,  bowel sounds present  Labs and images reviewed   Resolved problem list   Assessment and Plan  Acute on chronic HFrEF status post AICD Severe symptomatic hypervolemic hyponatremia Persistent A-fib status post recent cardioversion AV block status post permanent pacemaker Hypertension Hyperlipidemia Hypothyroidism  Admit to ICU Continue diuresis with Lasix  40 mg twice daily, she received 80 mg in the emergency department Monitor intake and output Avoid nephrotoxic agent GDMT as tolerated Patient was noted to have serum sodium of 119, likely due to combination of polydipsia and SIADH Continue fluid restriction to 1500 per 24 hours Continue diuresis Monitor serum sodium every 12 hours Currently in sinus rhythm Continue Eliquis  Resume oral antihypertensive and statin Continue levothyroxine     Labs   CBC: Recent Labs  Lab 03/15/24 0836  WBC 16.0*  HGB 13.7  HCT 38.7  MCV 88.2  PLT 303    Basic Metabolic Panel: Recent Labs  Lab 03/15/24 0836 03/15/24 1204  NA 119* 118*  K 5.2* 4.4  CL 84* 86*  CO2 21* 22  GLUCOSE 168* 132*  BUN 22 19  CREATININE 1.25* 1.10*  CALCIUM  9.5 9.0  MG 1.7  --    GFR: Estimated Creatinine Clearance: 54 mL/min (A) (by C-G formula based on SCr of 1.1 mg/dL (H)). Recent Labs  Lab 03/15/24 0836  WBC 16.0*    Liver Function Tests: No results for input(s): AST, ALT, ALKPHOS, BILITOT, PROT, ALBUMIN in the last 168 hours. No results for input(s): LIPASE, AMYLASE in the last 168 hours. No results for input(s): AMMONIA in the last 168 hours.  ABG    Component Value Date/Time   TCO2 28 03/07/2024 0931     Coagulation Profile: No results for input(s): INR, PROTIME in the last 168 hours.  Cardiac Enzymes: No results for input(s): CKTOTAL, CKMB, CKMBINDEX, TROPONINI in the last 168 hours.  HbA1C: No results found for: HGBA1C  CBG: No results for input(s): GLUCAP in the last 168  hours.  Review of Systems:   12 point review of system is significant for complaint mentioned HPI, rest is negative  Past Medical History:  She,  has a past medical history of AICD (automatic cardioverter/defibrillator) present, Anxiety, Arthritis, Back pain, Chronic systolic CHF (congestive heart failure) (HCC), Complete AV block (HCC), Dizziness, GERD (gastroesophageal reflux disease), Gout, Headache, History of blood clots, History of colon polyps, Hyperlipidemia, Hypertension, Hypothyroid, Joint pain, Joint swelling, Moderate Pericardial effusion, NICM (nonischemic cardiomyopathy) (HCC), Numbness and tingling, Ovarian cancer (HCC), Pacemaker lead failure--noise on atrial greater than ventricular lead with ventricular backup pulse pacing (10/17/2014), and Peripheral artery disease.   Surgical History:   Past Surgical History:  Procedure Laterality Date   ABDOMINAL HYSTERECTOMY     ANGIOPLASTY / STENTING ILIAC     BIV UPGRADE N/A 12/29/2023   Procedure: BIV UPGRADE;  Surgeon: Cindie Ole DASEN, MD;  Location: [facility] INVASIVE CV LAB;  Service:  Cardiovascular;  Laterality: N/A;   CARDIAC CATHETERIZATION N/A 02/08/2015   Procedure: Left Heart Cath and Coronary Angiography;  Surgeon: Deatrice DELENA Cage, MD;  Location: [facility] INVASIVE CV LAB;  Service: Cardiovascular;  Laterality: N/A;   CARDIOVERSION N/A 03/07/2024   Procedure: CARDIOVERSION;  Surgeon: Raford Riggs, MD;  Location: [facility] INVASIVE CV LAB;  Service: Cardiovascular;  Laterality: N/A;   CERVICAL CERCLAGE  1994   COLONOSCOPY     DG BARIUM SWALLOW ([facility] HX)     DILATION AND CURETTAGE OF UTERUS     EP IMPLANTABLE DEVICE Left 12/06/2014   Procedure: Implantation of cardiac resynchronization therapy pacemaker;  Surgeon: Danelle LELON Birmingham, MD;  Location: [facility] OR;  Service: Cardiovascular;  Laterality: Left;   EPICARDIAL PACING LEAD PLACEMENT N/A 02/05/2024   Procedure: INSERTION, EPICARDIAL ELECTRODE LEAD;  Surgeon: Lucas Dorise POUR, MD;   Location: [facility] OR;  Service: Thoracic;  Laterality: N/A;   HERNIA MESH REMOVAL Right    incisional   HERNIA REPAIR     ILIAC ARTERY STENT  04/05/2010   Left artery stenting   INSERT / REPLACE / REMOVE PACEMAKER     JOINT REPLACEMENT  10/31/2014   hip   KNEE SURGERY Right    LEAD INSERTION N/A 12/29/2023   Procedure: LEAD INSERTION;  Surgeon: Cindie Ole DASEN, MD;  Location: [facility] INVASIVE CV LAB;  Service: Cardiovascular;  Laterality: N/A;   PACEMAKER INSERTION  2012   Had replaced in July 2012, first placed in 1996   PACEMAKER LEAD REMOVAL Right 12/06/2014   Procedure: REMOVAL OF RV AND RA PACEMAKER LEADS;  Surgeon: Danelle LELON Birmingham, MD;  Location: [facility] OR;  Service: Cardiovascular;  Laterality: Right;  DR. BARTLE TO BACK UP    PACEMAKER PLACEMENT Left 12/06/2014   THORACOTOMY Left 02/05/2024   Procedure: THORACOTOMY, MAJOR;  Surgeon: Lucas Dorise POUR, MD;  Location: [facility] OR;  Service: Thoracic;  Laterality: Left;   TOTAL ABDOMINAL HYSTERECTOMY W/ BILATERAL SALPINGOOPHORECTOMY  07/2008   Ovarian cancer   TOTAL HIP ARTHROPLASTY Right 10/31/2014   Procedure: TOTAL HIP ARTHROPLASTY ANTERIOR APPROACH;  Surgeon: Ozell Flake, MD;  Location: [facility] ORS;  Service: Orthopedics;  Laterality: Right;     Social History:   reports that she quit smoking about 15 years ago. Her smoking use included cigarettes. She has never used smokeless tobacco. She reports current alcohol use. She reports that she does not use drugs.   Family History:  Her family history includes Arrhythmia in her son; Breast cancer in her paternal aunt and sister; Cancer in her father; Diabetes in her brother; Hyperlipidemia in her mother; Lung cancer in her father; Polymyalgia rheumatica in her mother.   Allergies Allergies  Allergen Reactions   Marshmallow [Althaea Officinalis]     Rash, hives, itching, SOB  Pt states this is no longer an issue for her   Other     Jello - Rash, hives, itching, SOB  Pt states is no longer an  issue for her   Contrast Media [Iodinated Contrast Media]     unknown   Latex Rash   Nickel Rash     Home Medications  Prior to Admission medications   Medication Sig Start Date End Date Taking? Authorizing Provider  acetaminophen  (TYLENOL ) 325 MG tablet Take 2 tablets (650 mg total) by mouth every 6 (six) hours as needed for mild pain (pain score 1-3). 02/08/24   Raguel Con RAMAN, PA-C  apixaban  (ELIQUIS ) 5 MG TABS tablet Take 1 tablet (5 mg total) by mouth  2 (two) times daily. 02/10/24   Terra Fairy PARAS, PA-C  carvedilol  (COREG ) 6.25 MG tablet Take 1 tablet (6.25 mg total) by mouth 2 (two) times daily. 11/19/23   Cindie Ole DASEN, MD  Cholecalciferol (VITAMIN D3) 25 MCG (1000 UT) CAPS Take 1,000 Units by mouth daily.    [provider]  clonazePAM  (KLONOPIN ) 0.5 MG tablet TAKE 1 TABLET BY MOUTH ONCE DAILY AS NEEDED FOR ANXIETY/ SLEEP 09/07/19   Stuart Vernell Norris, PA-C  cyanocobalamin (VITAMIN B12) 1000 MCG tablet Take 1,000 mcg by mouth daily.    [provider]  furosemide  (LASIX ) 20 MG tablet Take 20 mg by mouth daily. 01/15/24 01/14/25  [provider]  levothyroxine  (SYNTHROID ) 112 MCG tablet Take 1 tablet by mouth once daily 04/28/20   Cannady, Jolene T, NP  lovastatin  (MEVACOR ) 20 MG tablet TAKE 1 TABLET BY MOUTH AT BEDTIME 03/29/20   Johnson, Megan P, DO  oxyCODONE  (OXY IR/ROXICODONE ) 5 MG immediate release tablet Take 1 tablet (5 mg total) by mouth every 6 (six) hours as needed for severe pain (pain score 7-10). 02/08/24   Raguel Con RAMAN, PA-C  potassium chloride  (KLOR-CON  M) 10 MEQ tablet Take 1 tablet (10 mEq total) by mouth daily. 03/01/24   Rutha Manuelita HERO, PA-C  spironolactone  (ALDACTONE ) 25 MG tablet Take 12.5 mg by mouth daily.    [provider]     Critical care time:     The patient is critically ill due to severe symptomatic hyponatremia/acute on chronic HFrEF, requiring close monitoring of sodium and diuresis.  Critical  care was necessary to treat or prevent imminent or life-threatening deterioration.  Critical care was time spent personally by me on the following activities: development of treatment plan with patient and/or surrogate as well as nursing, discussions with consultants, evaluation of patient's response to treatment, examination of patient, obtaining history from patient or surrogate, ordering and performing treatments and interventions, ordering and review of laboratory studies, ordering and review of radiographic studies, pulse oximetry, re-evaluation of patient's condition and participation in multidisciplinary rounds.   During this encounter critical care time was devoted to patient care services described in this note for 40 minutes.     Valinda Novas, MD [facility] See Amion for pager If no response to pager, please call [phone] until 7pm After 7pm, Please call E-link [phone]

## 2024-03-15 NOTE — Consult Note (Signed)
 Cardiology Consultation   Patient ID: [name] [name] MRN: [id number]; DOB: October 21, 1955  Admit date: 03/15/2024 Date of Consult: 03/15/2024  PCP:  Sherial Bail, MD   [facility] Providers Cardiologist:  None  Electrophysiologist:  OLE ONEIDA HOLTS, MD    Patient Profile: [name] [name] is a 68 y.o. female with a hx of CHB s/p ICD, nonischemic cardiomyopathy, chronic HFrEF, persistent A-Fib s/p recent DCCV 03/07/2024, PVD, aortic and coronary calcifications per CT, hypertension, hypothyrdoism who is being seen 03/15/2024 for the evaluation of CHF at the request of Dr. Harold.  History of Present Illness: [name] has past medical history as above. She presented to [facility] ED on 03/15/2024 for shortness of breath, chest discomfort, orthopnea x 4-5 days. She reports good compliance with her medications but does note that she had been drinking more water  over the past week.   Relevant workup since admission/while in ED: BMP showed hyponatremia with sodium 118, creatinine elevated 1.25 ? 1.10 (baseline ~0.9), troponin 24 ? 21, BNP elevated at 741, CBC showed chronic leukocytosis, UA showed possible infection culture pending. CXR showed moderate left pleural effusion, stable cardiomegaly. EKG showed V-paced rhythm, HR 70.   She underwent recent DCCV on 03/07/2024 for atrial fibrillation. She tolerated the procedure well and was noted to be in sinus rhythm after 200 J x 1. There were no complications.   She follows closely with our EP team as an outpatient. She was last seen by Dr. Holts in August 2025 for follow up. At that appointment they decided to proceed with CRT-D upgrade. She was hospitalized from 9/16-9/17/2025 for upgrade from PPM to ICD. 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 She was then brought back 10/24-10/27/2025 for left thoracotomy for LV epicardial lead placement. She followed up with  our A-Fib clinic after this in Nov. 2025 as there was an alert for new A-Fib episode. She was started on Eliquis  5 mg BID and did well with no bleeding issues. She made the decision to undergo DCCV on 03/07/2024, this was successful.   When she followed up with CT surgery 03/01/2024 they noted she had a moderate sized left pleural effusion. She noted some shortness of breath on exertion. She was instructed to take PO Lasix  20 mg daily x 7 days with K 10 mEq. They discussed possible thoracentesis but she declined at that time.   Her relevant home medication regimen includes: Eliquis  5 mg BID, carvedilol  6.25 mg BID, PO Lasix  20 mg daily with K 10 mEq daily, spironolactone  12.5 mg daily. Dispense report shows inconsistent fill history.  After speaking with the patient she tells me that she started feeling poorly last week. She reports not having a good meal since Thanksgiving. She tells me that she has been having shortness of breath, insomnia, poor PO intake over the last 4-5 days. She reports feeling much better since she arrived. She is up in the chair in her room in the ICU. She reports good urine response to her IV Lasix  thus far. She is alert and oriented just feeling tired. She tells me that she was previously on oral Lasix  at home but she tells me that she had not been taking it recently until she was instructed to take it for 7 days at her CT surgery outpatient follow up appointment.   Past Medical History:  Diagnosis Date   AICD (automatic cardioverter/defibrillator) present    St. Jude  Anxiety    takes Klonopin  daily as needed   Arthritis    Back pain    reason unknown   Chronic systolic CHF (congestive heart failure) (HCC)    a. Echo 10/2013: EF 40-45 (apical images 35-40%); b. Echo 12/2014: EF 35-40%; c. 06/2020 Echo: EF 25-30%, glob HK, Gr2 DD, RVSP ~ ; d. 10/2020 Echo: EF 25-30%, glob HK. Nl RV fxn, nl PASP, mildly dil LA. Small peric eff, triv MR.   Complete AV block (HCC)    a.  s/p pacemaker insertion 1996; b. s/p contralateral side St. Jude generator change 11/2014.   Dizziness    occasionally   GERD (gastroesophageal reflux disease)    takes Zantac  daily as needed   Gout    Headache    occasionally   History of blood clots    in heart-was on Coumadin---this many yrs ago   History of colon polyps    benign   Hyperlipidemia    takes Lovastatin  daily   Hypertension    hx of-since hip replacement MD took resident off meds 10/30/14 b/c well controlled   Hypothyroid    takes Synthroid  daily   Joint pain    Joint swelling    Moderate Pericardial effusion    a. 07/2020 Echo: Mod effusion w/o tamponade; b. 10/2020 Echo: small peric eff w/o tamponade.   NICM (nonischemic cardiomyopathy) (HCC)    a. tachycardia induced->EF 40-45% (2010), 35-40% (10/2013), 35-40% Gr1 DD, mod TR (12/2014);  b. 01/2015 Cath: Nl cors, EF 30-35%, glob HK; c. 06/2020 Echo: EF 25-30%; d. 10/2020 Echo: EF 25-30%.   Numbness and tingling    left leg and right arm   Ovarian cancer (HCC)    ovarian, skin    Pacemaker lead failure--noise on atrial greater than ventricular lead with ventricular backup pulse pacing 10/17/2014   Peripheral artery disease    stent on lt illiac   Past Surgical History:  Procedure Laterality Date   ABDOMINAL HYSTERECTOMY     ANGIOPLASTY / STENTING ILIAC     BIV UPGRADE N/A 12/29/2023   Procedure: BIV UPGRADE;  Surgeon: Cindie Ole DASEN, MD;  Location: [facility] INVASIVE CV LAB;  Service: Cardiovascular;  Laterality: N/A;   CARDIAC CATHETERIZATION N/A 02/08/2015   Procedure: Left Heart Cath and Coronary Angiography;  Surgeon: Deatrice DELENA Cage, MD;  Location: [facility] INVASIVE CV LAB;  Service: Cardiovascular;  Laterality: N/A;   CARDIOVERSION N/A 03/07/2024   Procedure: CARDIOVERSION;  Surgeon: Raford Riggs, MD;  Location: [facility] INVASIVE CV LAB;  Service: Cardiovascular;  Laterality: N/A;   CERVICAL CERCLAGE  1994   COLONOSCOPY     DG BARIUM SWALLOW ([facility] HX)      DILATION AND CURETTAGE OF UTERUS     EP IMPLANTABLE DEVICE Left 12/06/2014   Procedure: Implantation of cardiac resynchronization therapy pacemaker;  Surgeon: Danelle LELON Birmingham, MD;  Location: [facility] OR;  Service: Cardiovascular;  Laterality: Left;   EPICARDIAL PACING LEAD PLACEMENT N/A 02/05/2024   Procedure: INSERTION, EPICARDIAL ELECTRODE LEAD;  Surgeon: Lucas Dorise POUR, MD;  Location: [facility] OR;  Service: Thoracic;  Laterality: N/A;   HERNIA MESH REMOVAL Right    incisional   HERNIA REPAIR     ILIAC ARTERY STENT  04/05/2010   Left artery stenting   INSERT / REPLACE / REMOVE PACEMAKER     JOINT REPLACEMENT  10/31/2014   hip   KNEE SURGERY Right    LEAD INSERTION N/A 12/29/2023   Procedure: LEAD INSERTION;  Surgeon: Cindie Ole DASEN, MD;  Location: [facility] INVASIVE CV LAB;  Service: Cardiovascular;  Laterality: N/A;   PACEMAKER INSERTION  2012   Had replaced in July 2012, first placed in 1996   PACEMAKER LEAD REMOVAL Right 12/06/2014   Procedure: REMOVAL OF RV AND RA PACEMAKER LEADS;  Surgeon: Danelle LELON Birmingham, MD;  Location: [facility] OR;  Service: Cardiovascular;  Laterality: Right;  DR. BARTLE TO BACK UP    PACEMAKER PLACEMENT Left 12/06/2014   THORACOTOMY Left 02/05/2024   Procedure: THORACOTOMY, MAJOR;  Surgeon: Lucas Dorise POUR, MD;  Location: [facility] OR;  Service: Thoracic;  Laterality: Left;   TOTAL ABDOMINAL HYSTERECTOMY W/ BILATERAL SALPINGOOPHORECTOMY  07/2008   Ovarian cancer   TOTAL HIP ARTHROPLASTY Right 10/31/2014   Procedure: TOTAL HIP ARTHROPLASTY ANTERIOR APPROACH;  Surgeon: Ozell Flake, MD;  Location: [facility] ORS;  Service: Orthopedics;  Laterality: Right;    Home Medications:  Prior to Admission medications   Medication Sig Start Date End Date Taking? Authorizing Provider  acetaminophen  (TYLENOL ) 325 MG tablet Take 2 tablets (650 mg total) by mouth every 6 (six) hours as needed for mild pain (pain score 1-3). 02/08/24   Raguel Con RAMAN, PA-C  apixaban  (ELIQUIS ) 5 MG TABS tablet Take 1  tablet (5 mg total) by mouth 2 (two) times daily. 02/10/24   Terra Fairy PARAS, PA-C  carvedilol  (COREG ) 6.25 MG tablet Take 1 tablet (6.25 mg total) by mouth 2 (two) times daily. 11/19/23   Cindie Ole DASEN, MD  Cholecalciferol (VITAMIN D3) 25 MCG (1000 UT) CAPS Take 1,000 Units by mouth daily.    [provider]  clonazePAM  (KLONOPIN ) 0.5 MG tablet TAKE 1 TABLET BY MOUTH ONCE DAILY AS NEEDED FOR ANXIETY/ SLEEP 09/07/19   Stuart Vernell Norris, PA-C  cyanocobalamin (VITAMIN B12) 1000 MCG tablet Take 1,000 mcg by mouth daily.    [provider]  furosemide  (LASIX ) 20 MG tablet Take 20 mg by mouth daily. 01/15/24 01/14/25  [provider]  levothyroxine  (SYNTHROID ) 112 MCG tablet Take 1 tablet by mouth once daily 04/28/20   Cannady, Jolene T, NP  lovastatin  (MEVACOR ) 20 MG tablet TAKE 1 TABLET BY MOUTH AT BEDTIME 03/29/20   Johnson, Megan P, DO  oxyCODONE  (OXY IR/ROXICODONE ) 5 MG immediate release tablet Take 1 tablet (5 mg total) by mouth every 6 (six) hours as needed for severe pain (pain score 7-10). 02/08/24   Raguel Con RAMAN, PA-C  potassium chloride  (KLOR-CON  M) 10 MEQ tablet Take 1 tablet (10 mEq total) by mouth daily. 03/01/24   Rutha Manuelita HERO, PA-C  spironolactone  (ALDACTONE ) 25 MG tablet Take 12.5 mg by mouth daily.    [provider]    Scheduled Meds:  apixaban   5 mg Oral BID   carvedilol   6.25 mg Oral BID WC   Chlorhexidine  Gluconate Cloth  6 each Topical Daily   furosemide   40 mg Intravenous BID   levothyroxine   112 mcg Oral QHS   [START ON 03/16/2024] pravastatin   20 mg Oral q1800   [START ON 03/16/2024] spironolactone   12.5 mg Oral Daily   Continuous Infusions:  PRN Meds: acetaminophen , clonazePAM , docusate sodium , oxyCODONE , polyethylene glycol  Allergies:    Allergies  Allergen Reactions   Marshmallow [Althaea Officinalis]     Rash, hives, itching, SOB  Pt states this is no longer an issue for her   Other     Jello - Rash, hives,  itching, SOB  Pt states is no longer an issue for her   Contrast Media [Iodinated Contrast Media]  unknown   Latex Rash   Nickel Rash   Social History:   Social History   Socioeconomic History   Marital status: Married    Spouse name: Not on file   Number of children: Not on file   Years of education: Not on file   Highest education level: Not on file  Occupational History   Not on file  Tobacco Use   Smoking status: Former    Current packs/day: 0.00    Types: Cigarettes    Quit date: 07/17/2008    Years since quitting: 15.6   Smokeless tobacco: Never   Tobacco comments:    Former smoker 02/10/24  Vaping Use   Vaping status: Never Used  Substance and Sexual Activity   Alcohol use: Yes    Comment: rum daily-previously drank beer daily   Drug use: No   Sexual activity: Not Currently    Birth control/protection: Surgical  Other Topics Concern   Not on file  Social History Narrative   Not on file   Social Drivers of Health   Financial Resource Strain: Low Risk  (08/19/2023)   Received from [facility]   Overall Financial Resource Strain (CARDIA)    Difficulty of Paying Living Expenses: Not hard at all  Food Insecurity: No Food Insecurity (02/05/2024)   Hunger Vital Sign    Worried About Running Out of Food in the Last Year: Never true    Ran Out of Food in the Last Year: Never true  Transportation Needs: No Transportation Needs (02/05/2024)   PRAPARE - Administrator, Civil Service (Medical): No    Lack of Transportation (Non-Medical): No  Physical Activity: Not on file  Stress: Not on file  Social Connections: Socially Integrated (02/05/2024)   Social Connection and Isolation Panel    Frequency of Communication with Friends and Family: More than three times a week    Frequency of Social Gatherings with Friends and Family: Once a week    Attends Religious Services: More than 4 times per year    Active Member of [employer] or  Organizations: Yes    Attends Engineer, Structural: More than 4 times per year    Marital Status: Married  Catering Manager Violence: Not At Risk (02/05/2024)   Humiliation, Afraid, Rape, and Kick questionnaire    Fear of Current or Ex-Partner: No    Emotionally Abused: No    Physically Abused: No    Sexually Abused: No    Family History:   Family History  Problem Relation Age of Onset   Hyperlipidemia Mother    Polymyalgia rheumatica Mother    Cancer Father        died of lung cancer   Lung cancer Father    Breast cancer Sister    Breast cancer Paternal Aunt    Diabetes Brother    Arrhythmia Son     ROS:  Please see the history of present illness.  All other ROS reviewed and negative.     Physical Exam/Data: Vitals:   03/15/24 1400 03/15/24 1430 03/15/24 1500 03/15/24 1530  BP: 121/75 98/77 117/62   Pulse: 70 69 70   Resp: (!) 21 (!) 23 (!) 22   Temp:    97.7 F (36.5 C)  TempSrc:    Oral  SpO2: 95% 93% 91%     Intake/Output Summary (Last 24 hours) at 03/15/2024 1546 Last data filed at 03/15/2024 1500 Gross per 24 hour  Intake --  Output  200 ml  Net -200 ml      03/01/2024    2:49 PM 03/01/2024    1:50 PM 02/15/2024   12:31 PM  Last 3 Weights  Weight (lbs) 194 lb 192 lb 6.4 oz 188 lb  Weight (kg) 87.998 kg 87.272 kg 85.276 kg     There is no height or weight on file to calculate BMI.   General:  in no acute distress, resting comfortably in the chair, on room air  HEENT: normal Vascular: Distal pulses 2+ bilaterally Cardiac:  normal S1, S2; RRR; no murmur  Lungs:  breath sounds diminished on left side   Abd: soft, nontender, no hepatomegaly  Ext: no edema Musculoskeletal:  No deformities Skin: warm and dry  Neuro:  no focal abnormalities noted Psych:  Normal affect   Telemetry:  Telemetry was personally reviewed and demonstrates:  V-paced, HR 70s  Relevant CV Studies:  Echocardiogram, 03/15/2024 Ordered, pending results  Echocardiogram,   10/16/2020 Left ventricular ejection fraction, by estimation, is 25 to 30% . The left ventricle has severely decreased function. The left ventricle demonstrates global hypokinesis. The left ventricular internal cavity size was severely dilated. Left ventricular diastolic parameters are indeterminate. The average left ventricular global longitudinal strain is - 8. 8 % . The global longitudinal strain is abnormal.  Right ventricular systolic function is normal. The right ventricular size is normal. There is normal pulmonary artery systolic pressure.  Left atrial size was mildly dilated.  A small pericardial effusion is present. The pericardial effusion is circumferential. There is no evidence of cardiac tamponade.  The mitral valve is normal in structure. Trivial mitral valve regurgitation. No evidence of mitral stenosis.  The aortic valve has an indeterminant number of cusps. Aortic valve regurgitation is not visualized. No aortic stenosis is present.  The inferior vena cava is normal in size with greater than 50% respiratory variability, suggesting right atrial pressure of 3 mmHg.  LHC, 02/08/2015 There is moderate to severe left ventricular systolic dysfunction.   1. Normal coronary arteries.  2. Moderately to severely reduced LVSF with an EF of 30-35%.  3. High normal LVEDP.    Recommendations:  Continue medical therapy for non-ischemic cardiomyopathy. Consider adding small dose Digoxin  followed by a small dose Carvedilol .   Laboratory Data: High Sensitivity Troponin:   Recent Labs  Lab 03/15/24 0836 03/15/24 1022  TROPONINIHS 24* 21*     Chemistry Recent Labs  Lab 03/15/24 0836 03/15/24 1204  NA 119* 118*  K 5.2* 4.4  CL 84* 86*  CO2 21* 22  GLUCOSE 168* 132*  BUN 22 19  CREATININE 1.25* 1.10*  CALCIUM  9.5 9.0  MG 1.7  --   GFRNONAA 48* 55*  ANIONGAP 14 10    No results for input(s): PROT, ALBUMIN, AST, ALT, ALKPHOS, BILITOT in the last 168 hours. Lipids No  results for input(s): CHOL, TRIG, HDL, LABVLDL, LDLCALC, CHOLHDL in the last 168 hours.  Hematology Recent Labs  Lab 03/15/24 0836  WBC 16.0*  RBC 4.39  HGB 13.7  HCT 38.7  MCV 88.2  MCH 31.2  MCHC 35.4  RDW 13.0  PLT 303   Thyroid  No results for input(s): TSH, FREET4 in the last 168 hours.  BNP Recent Labs  Lab 03/15/24 0836  BNP 741.8*    DDimer No results for input(s): DDIMER in the last 168 hours.  Radiology/Studies:  DG Chest 2 View Result Date: 03/15/2024 CLINICAL DATA:  Shortness of breath 2 days. EXAM: CHEST - 2 VIEW  COMPARISON:  03/01/2024 FINDINGS: Left-sided pacemaker unchanged. Lungs are adequately inflated demonstrate opacification over the left base/retrocardiac region with slight interval worsening compatible with moderate size effusion likely with associated basilar atelectasis. Right lung is clear. Stable cardiomegaly. Remainder of the exam is unchanged. IMPRESSION: 1. Slight interval worsening opacification over the left base/retrocardiac region compatible with moderate size effusion likely with associated basilar atelectasis. 2. Stable cardiomegaly. Electronically Signed   By: Toribio Agreste M.D.   On: 03/15/2024 09:06   Assessment and Plan:  Nonischemic cardiomyopathy (EF 25-30%) Complete heart block s/p ICD  Presented with shortness of breath, chest discomfort BNP 741 CXR showed moderate left-sided effusion, stable cardiomegaly  Home meds: carvedilol  6.25 mg BID, PO Lasix  20 mg daily + 10 mEq K, spironolactone  12.5 mg daily Intolerant to SGLT2i Previously on Entresto  24-26 mg BID, stopped at discharge 01/2024 per CT surgery in setting of hypotension Started on IV Lasix , but does not appear volume overloaded on exam, breath sounds nonexistent in left field correlates with CXR -- suspect that she will likely need a thoracentesis to treat this, this was previously discussed at outpatient CT surgery follow up  Will discuss with MD regarding  additional diuresis Continue carvedilol  6.25 mg BID Continue spironolactone  12.5 mg daily  Continue daily weights, strict I&O's, daily BMPs Update echocardiogram this admission, will adjust meds as needed   Persistent A-Fib  s/p recent DCCV 03/07/2024 Home meds: carvedilol  6.25 mg BID, Eliquis  5 mg BID Presently in sinus rhythm, V-paced  Continue home medications  Continue to monitor on telemetry   Hypertension Home meds: carvedilol  6.25 mg BID, PO Lasix  20 mg daily, spironolactone  12.5 mg daily BP well controlled this admission Continue home medications  Hyperlipidemia LDL 71 as of 08/2023 Home meds: lovastatin  20 mg at bedtime  Continue statin   Per primary Severe hyponatremia hypothyroidism  Risk Assessment/Risk Scores:      [place]  Heart Association (NYHA) Functional Class NYHA Class III  CHA2DS2-VASc Score = 5   This indicates a 7.2% annual risk of stroke. The patient's score is based upon: CHF History: 1 HTN History: 1 Diabetes History: 0 Stroke History: 0 Vascular Disease History: 1 Age Score: 1 Gender Score: 1     For questions or updates, please contact [facility] Please consult [email or web address] for contact info under   Signed, Waddell DELENA Donath, PA-C  03/15/2024 3:46 PM

## 2024-03-15 NOTE — ED Notes (Signed)
 Date and time results received: 03/15/24 1005 (use smartphrase .now to insert current time)  Test: sodium Critical Value: 119  Name of Provider Notified: Remlap PA

## 2024-03-15 NOTE — ED Provider Notes (Signed)
 [facility] Provider Note   CSN: [id number] Arrival date & time: 03/15/24  9244     Patient presents with: Shortness of Breath, Chest Pain, no appetite, and inspmnia   [name] [name] is a 68 y.o. female past medical history significant for hyperlipidemia, cardiomyopathy, AV block, permanent pacemaker, peripheral arterial disease, persistent A-fib, hypertension who presents with concern for shortness of breath, chest discomfort, unable to sleep or eat.  Cardioversion recently performed 11/24.  Very low ejection fraction around 20 to 30%.  Feels like she might have fluid on her lungs.  She reports that she has been taking some Lasix  and potassium supplement at home but cannot tell me how much.    Shortness of Breath Associated symptoms: chest pain   Chest Pain Associated symptoms: shortness of breath        Prior to Admission medications   Medication Sig Start Date End Date Taking? Authorizing Provider  acetaminophen  (TYLENOL ) 325 MG tablet Take 2 tablets (650 mg total) by mouth every 6 (six) hours as needed for mild pain (pain score 1-3). 02/08/24   Raguel Con RAMAN, PA-C  apixaban  (ELIQUIS ) 5 MG TABS tablet Take 1 tablet (5 mg total) by mouth 2 (two) times daily. 02/10/24   Terra Fairy PARAS, PA-C  carvedilol  (COREG ) 6.25 MG tablet Take 1 tablet (6.25 mg total) by mouth 2 (two) times daily. 11/19/23   Cindie Ole DASEN, MD  Cholecalciferol (VITAMIN D3) 25 MCG (1000 UT) CAPS Take 1,000 Units by mouth daily.    [provider]  clonazePAM  (KLONOPIN ) 0.5 MG tablet TAKE 1 TABLET BY MOUTH ONCE DAILY AS NEEDED FOR ANXIETY/ SLEEP 09/07/19   Stuart Vernell Norris, PA-C  cyanocobalamin (VITAMIN B12) 1000 MCG tablet Take 1,000 mcg by mouth daily.    [provider]  furosemide  (LASIX ) 20 MG tablet Take 20 mg by mouth daily. 01/15/24 01/14/25  [provider]  levothyroxine  (SYNTHROID ) 112 MCG tablet Take 1 tablet by mouth once  daily 04/28/20   Cannady, Jolene T, NP  lovastatin  (MEVACOR ) 20 MG tablet TAKE 1 TABLET BY MOUTH AT BEDTIME 03/29/20   Johnson, Megan P, DO  oxyCODONE  (OXY IR/ROXICODONE ) 5 MG immediate release tablet Take 1 tablet (5 mg total) by mouth every 6 (six) hours as needed for severe pain (pain score 7-10). 02/08/24   Raguel Con RAMAN, PA-C  potassium chloride  (KLOR-CON  M) 10 MEQ tablet Take 1 tablet (10 mEq total) by mouth daily. 03/01/24   Rutha Manuelita HERO, PA-C  spironolactone  (ALDACTONE ) 25 MG tablet Take 12.5 mg by mouth daily.    [provider]    Allergies: Gaither margo officinalis], Other, Contrast media [iodinated contrast media], Latex, and Nickel    Review of Systems  Respiratory:  Positive for shortness of breath.   Cardiovascular:  Positive for chest pain.  All other systems reviewed and are negative.   Updated Vital Signs BP 125/73   Pulse 70   Temp (!) 97.5 F (36.4 C) (Oral)   Resp (!) 23   LMP  (LMP Unknown)   SpO2 96%   Physical Exam Vitals and nursing note reviewed.  Constitutional:      General: She is not in acute distress.    Appearance: Normal appearance.  HENT:     Head: Normocephalic and atraumatic.  Eyes:     General:        Right eye: No discharge.        Left eye: No discharge.  Cardiovascular:     Rate and Rhythm: Normal rate and regular rhythm.     Heart sounds: No murmur heard.    No friction rub. No gallop.  Pulmonary:     Effort: Pulmonary effort is normal.     Breath sounds: Normal breath sounds.     Comments: No wheezing, rhonchi, stridor, rales throughout, borderline tachypnea, increased respiratory effort, question some crackles at lung bases Abdominal:     General: Bowel sounds are normal.     Palpations: Abdomen is soft.  Skin:    General: Skin is warm and dry.     Capillary Refill: Capillary refill takes less than 2 seconds.  Neurological:     Mental Status: She is alert and oriented to person, place, and time.   Psychiatric:        Mood and Affect: Mood normal.        Behavior: Behavior normal.     (all labs ordered are listed, but only abnormal results are displayed) Labs Reviewed  BASIC METABOLIC PANEL WITH GFR - Abnormal; Notable for the following components:      Result Value   Sodium 119 (*)    Potassium 5.2 (*)    Chloride 84 (*)    CO2 21 (*)    Glucose, Bld 168 (*)    Creatinine, Ser 1.25 (*)    GFR, Estimated 48 (*)    All other components within normal limits  CBC - Abnormal; Notable for the following components:   WBC 16.0 (*)    All other components within normal limits  URINALYSIS, ROUTINE W REFLEX MICROSCOPIC - Abnormal; Notable for the following components:   Color, Urine AMBER (*)    APPearance CLOUDY (*)    Hgb urine dipstick SMALL (*)    Protein, ur 100 (*)    Bacteria, UA MANY (*)    All other components within normal limits  BRAIN NATRIURETIC PEPTIDE - Abnormal; Notable for the following components:   B Natriuretic Peptide 741.8 (*)    All other components within normal limits  BASIC METABOLIC PANEL WITH GFR - Abnormal; Notable for the following components:   Sodium 118 (*)    Chloride 86 (*)    Glucose, Bld 132 (*)    Creatinine, Ser 1.10 (*)    GFR, Estimated 55 (*)    All other components within normal limits  TROPONIN I (HIGH SENSITIVITY) - Abnormal; Notable for the following components:   Troponin I (High Sensitivity) 24 (*)    All other components within normal limits  TROPONIN I (HIGH SENSITIVITY) - Abnormal; Notable for the following components:   Troponin I (High Sensitivity) 21 (*)    All other components within normal limits  URINE CULTURE  MRSA NEXT GEN BY PCR, NASAL  MAGNESIUM   HIV ANTIBODY (ROUTINE TESTING W REFLEX)  SODIUM, URINE, RANDOM  OSMOLALITY, URINE  OSMOLALITY  SODIUM    EKG: EKG Interpretation Date/Time:  Tuesday March 15 2024 08:04:57 EST Ventricular Rate:  70 PR Interval:    QRS Duration:  140 QT  Interval:  448 QTC Calculation: 483 R Axis:   192  Text Interpretation: Ventricular-paced rhythm Abnormal ECG When compared with ECG of 07-Mar-2024 10:27, PREVIOUS ECG IS PRESENT Confirmed by Laurice Coy [phone]) on 03/15/2024 9:38:38 AM  Radiology: [facility] Chest 2 View Result Date: 03/15/2024 CLINICAL DATA:  Shortness of breath 2 days. EXAM: CHEST - 2 VIEW COMPARISON:  03/01/2024 FINDINGS: Left-sided pacemaker unchanged. Lungs are adequately inflated demonstrate opacification over the left  base/retrocardiac region with slight interval worsening compatible with moderate size effusion likely with associated basilar atelectasis. Right lung is clear. Stable cardiomegaly. Remainder of the exam is unchanged. IMPRESSION: 1. Slight interval worsening opacification over the left base/retrocardiac region compatible with moderate size effusion likely with associated basilar atelectasis. 2. Stable cardiomegaly. Electronically Signed   By: Toribio Agreste M.D.   On: 03/15/2024 09:06     .Critical Care  Performed by: Rosan Sherlean DEL, PA-C Authorized by: Rosan Sherlean DEL, PA-C   Critical care provider statement:    Critical care time (minutes):  35   Critical care was necessary to treat or prevent imminent or life-threatening deterioration of the following conditions:  Metabolic crisis   Critical care was time spent personally by me on the following activities:  Development of treatment plan with patient or surrogate, discussions with consultants, evaluation of patient's response to treatment, examination of patient, ordering and review of laboratory studies, ordering and review of radiographic studies, ordering and performing treatments and interventions, pulse oximetry, re-evaluation of patient's condition and review of old charts   Care discussed with: admitting provider      Medications Ordered in the ED  Chlorhexidine  Gluconate Cloth 2 % PADS 6 each (has no administration in time range)   docusate sodium  (COLACE) capsule 100 mg (has no administration in time range)  polyethylene glycol (MIRALAX / GLYCOLAX) packet 17 g (has no administration in time range)  acetaminophen  (TYLENOL ) tablet 650 mg (has no administration in time range)  apixaban  (ELIQUIS ) tablet 5 mg (has no administration in time range)  carvedilol  (COREG ) tablet 6.25 mg (has no administration in time range)  clonazePAM  (KLONOPIN ) tablet 0.5 mg (has no administration in time range)  levothyroxine  (SYNTHROID ) tablet 112 mcg (has no administration in time range)  pravastatin  (PRAVACHOL ) tablet 20 mg (has no administration in time range)  oxyCODONE  (Oxy IR/ROXICODONE ) immediate release tablet 5 mg (has no administration in time range)  spironolactone  (ALDACTONE ) tablet 12.5 mg (has no administration in time range)  furosemide  (LASIX ) injection 40 mg (has no administration in time range)  clonazePAM  (KLONOPIN ) tablet 0.5 mg (0.5 mg Oral Given 03/15/24 0905)  furosemide  (LASIX ) injection 80 mg (80 mg Intravenous Given 03/15/24 1057)    Clinical Course as of 03/15/24 1305  Tue Mar 15, 2024  0953 Sodium(!!): 119 Critical hyponatremia, hyperkalemia, as well as AKI, creatinine 1.25 from baseline around 0.8  No focal neurologic deficit with hyponatremia [CP]  [address] [facility] -- Critical Care Dr. Shlomo -- Cardiology [CP]    Clinical Course User Index [CP] Rosan Sherlean DEL, PA-C                                 Medical Decision Making Amount and/or Complexity of Data Reviewed Labs: ordered. Radiology: ordered.  This patient is a 68 y.o. female who presents to the ED for concern of shortness of breath, this involves an extensive number of treatment options, and is a complaint that carries with it a high risk of complications and morbidity. The emergent differential diagnosis prior to evaluation includes, but is not limited to,  asthma exacerbation, COPD exacerbation, acute upper respiratory infection, acute  bronchitis, chronic bronchitis, interstitial lung disease, ARDS, PE, pneumonia, atypical ACS, carbon monoxide poisoning, spontaneous pneumothorax, new CHF vs CHF exacerbation, versus other. This is not an exhaustive differential.   Past Medical History / Co-morbidities / Social History: hyperlipidemia, cardiomyopathy, AV block, permanent pacemaker,  peripheral arterial disease, persistent A-fib, hypertension  Additional history: Chart reviewed. Pertinent results include: extensively reviewed recent radio versions, cardiology evaluations, echoes, recent EF of 2025%  Physical Exam: Physical exam performed. The pertinent findings include: Crackles at lung bases, increased work of breathing, tachypnea, but stable oxygen  saturation on room air, thankfully without any focal neurologic deficits, moving all 4 limbs spontaneously, no facial droop, no seizure activity in the emergency department  Lab Tests: I ordered, and personally interpreted labs.  The pertinent results include: CBC notable for leukocytosis, white blood cells 16.0, CBC otherwise overall unremarkable.  Unclear etiology for leukocytosis.  Her UA does show many bacteria with 6-10 white blood cells, although she denies any urinary symptoms, unclear whether this represents a true urinary tract infection, will send for culture.  Her BNP is elevated at 740, her troponin is elevated at 24.  Her BMP is grossly abnormal with a critical hyponatremia sodium 119, hyperkalemia, Tessman 5.2, hypochloremia, chloride 84, as well as an AKI, creatinine 1.25.  Mildly elevated nonfasting glucose at 168.  Normal magnesium  at 1.7.  Repeat BMP with worsening hyponatremia, sodium 118, suspect critical care will proceed with admission.   Imaging Studies: I ordered imaging studies including plain film chest x-ray which shows worsening pleural effusion on the left as well as known cardiomegaly.. I independently visualized and interpreted imaging.I agree with the  radiologist interpretation.   Cardiac Monitoring:  The patient was maintained on a cardiac monitor.  My attending physician Dr. Laurice viewed and interpreted the cardiac monitored which showed an underlying rhythm of: Ventricular paced rhythm. I agree with this interpretation.   Medications: I ordered medication including: Klonopin  for anxiety, Lasix  for her severe suspected dilutional hyponatremia.  Consultations Obtained: I requested consultation with the critical care, spoke with PA JD Emilio who will assess patient at bedside, make recommendations, spoke with Dr. Shlomo with cardiology who also agrees to consult on this patient during her hospitalization,  and discussed lab and imaging findings as well as pertinent plan - they recommend: We will start with 80 mg Lasix , and await critical care recommendations for either ICU admission versus hospital admission with ICU recommendations.   Disposition: After consideration of the diagnostic results and the patients response to treatment, I feel that patient would benefit from hospitalization for critical hyponatremia, AKI, heart failure exacerbation.   I discussed this case with my attending physician Dr. Laurice who cosigned this note including patient's presenting symptoms, physical exam, and planned diagnostics and interventions. Attending physician stated agreement with plan or made changes to plan which were implemented.     Final diagnoses:  Hyponatremia    ED Discharge Orders     None          Rosan Sherlean VEAR DEVONNA 03/15/24 1305    Laurice Maude BROCKS, MD 03/15/24 1452

## 2024-03-16 ENCOUNTER — Inpatient Hospital Stay (HOSPITAL_COMMUNITY): Payer: PRIVATE HEALTH INSURANCE

## 2024-03-16 LAB — BASIC METABOLIC PANEL WITH GFR
Anion gap: 13 (ref 5–15)
Anion gap: 14 (ref 5–15)
BUN: 20 mg/dL (ref 8–23)
BUN: 21 mg/dL (ref 8–23)
CO2: 26 mmol/L (ref 22–32)
CO2: 24 mmol/L (ref 22–32)
Calcium: 8.9 mg/dL (ref 8.9–10.3)
Calcium: 8.9 mg/dL (ref 8.9–10.3)
Chloride: 85 mmol/L — ABNORMAL LOW (ref 98–111)
Chloride: 84 mmol/L — ABNORMAL LOW (ref 98–111)
Creatinine, Ser: 1.1 mg/dL — ABNORMAL HIGH (ref 0.44–1.00)
Creatinine, Ser: 1.17 mg/dL — ABNORMAL HIGH (ref 0.44–1.00)
GFR, Estimated: 55 mL/min — ABNORMAL LOW (ref >60)
GFR, Estimated: 51 mL/min — ABNORMAL LOW (ref >60)
Glucose, Bld: 103 mg/dL — ABNORMAL HIGH (ref 70–99)
Glucose, Bld: 142 mg/dL — ABNORMAL HIGH (ref 70–99)
Potassium: 3.9 mmol/L (ref 3.5–5.1)
Potassium: 4.3 mmol/L (ref 3.5–5.1)
Sodium: 124 mmol/L — ABNORMAL LOW (ref 135–145)
Sodium: 122 mmol/L — ABNORMAL LOW (ref 135–145)

## 2024-03-16 LAB — URINE CULTURE

## 2024-03-16 LAB — ECHOCARDIOGRAM COMPLETE
Area-P 1/2: 4.49 cm2
S' Lateral: 5 cm
Single Plane A4C EF: 36.8 %
Weight: 2906.54 [oz_av]

## 2024-03-16 LAB — CBC
HCT: 34.5 % — ABNORMAL LOW (ref 36.0–46.0)
Hemoglobin: 12.4 g/dL (ref 12.0–15.0)
MCH: 31.2 pg (ref 26.0–34.0)
MCHC: 35.9 g/dL (ref 30.0–36.0)
MCV: 86.7 fL (ref 80.0–100.0)
Platelets: 258 K/uL (ref 150–400)
RBC: 3.98 MIL/uL (ref 3.87–5.11)
RDW: 13.1 % (ref 11.5–15.5)
WBC: 10.7 K/uL — ABNORMAL HIGH (ref 4.0–10.5)
nRBC: 0 % (ref 0.0–0.2)

## 2024-03-16 LAB — MAGNESIUM
Magnesium: 1.7 mg/dL (ref 1.7–2.4)
Magnesium: 1.8 mg/dL (ref 1.7–2.4)

## 2024-03-16 LAB — OSMOLALITY: Osmolality: 258 mosm/kg — ABNORMAL LOW (ref 275–295)

## 2024-03-16 LAB — PHOSPHORUS: Phosphorus: 4.4 mg/dL (ref 2.5–4.6)

## 2024-03-16 LAB — BRAIN NATRIURETIC PEPTIDE: B Natriuretic Peptide: 502.6 pg/mL — ABNORMAL HIGH (ref 0.0–100.0)

## 2024-03-16 LAB — TSH: TSH: 3.121 u[IU]/mL (ref 0.350–4.500)

## 2024-03-16 MED ORDER — FUROSEMIDE 10 MG/ML IJ SOLN
20.0000 mg | Freq: Once | INTRAMUSCULAR | Status: AC
  Administered 2024-03-16: 20 mg via INTRAVENOUS
  Filled 2024-03-16: qty 2

## 2024-03-16 MED ORDER — PERFLUTREN LIPID MICROSPHERE
1.0000 mL | INTRAVENOUS | Status: AC | PRN
  Administered 2024-03-16: 2 mL via INTRAVENOUS

## 2024-03-16 MED ORDER — ORAL CARE MOUTH RINSE: 15.0000 mL | OROMUCOSAL | Status: DC | PRN

## 2024-03-16 MED ORDER — MAGNESIUM SULFATE 2 GM/50ML IV SOLN
2.0000 g | Freq: Once | INTRAVENOUS | Status: AC
  Administered 2024-03-16: 2 g via INTRAVENOUS
  Filled 2024-03-16: qty 50

## 2024-03-16 NOTE — Consult Note (Cosign Needed Addendum)
 Cardiology Consultation   Patient ID: [name] [name] MRN: [id number]; DOB: 1955/08/01  Admit date: 03/15/2024 Date of Consult: 03/16/2024  PCP:  Sherial Bail, MD   [facility] Providers Cardiologist:  None  Electrophysiologist:  Dr. Fernande > [name] [name] HOLTS, MD     Patient Profile: [name] [name] is a 68 y.o. 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 Shlomo.  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 followed for some years with Dr. Fernande, NICM with EF over the years for some time 40's > seems attempts for CRT at time of extraction > new implant  back in 2016 were undertaken, though difficult and LV lead not done, EF worsening into the 20's attempts again to upgrade to CRT-D though again, unable to cannulate CS, and an ICD lead alone added to her system >> ultimately an epicardial lead placed Oct 2025  Shortly afterwards AFib found via her device, saw the Afib clinic 02/10/24, she was unaware, rate controlled (paced) and started on Eliquis  with plans to DCCV once adequately anticoagulated  03/01/24: worsening CXR/left effusion  DCCV to SR 03/07/24  Admitted yesterday with increasing SOB despite her lasix , and pleuritic sounding CP. She was found with significant  hyponatremia at 118, CXR L pleural effusion (in Dr. Dorine evaluation, seems to 1st appear on her post thoracotomy CXR with subsequent CXRs that were improved until 11/18 > moderate size left effusion associate with compressive atelectasis in the left base) Suspect not primarily CHF/cardiac related Noted to be in AFib/flutter (ERAF).  did undergo left thora getting 1300 cc's of yellowish appearing fluid with symptomatic improvement.  Cardiology has asked EP to see her for recommendations on management of her AFib. She is pending an updated echo (done, not yet read)  LABS NA+ 119, 118, 120, 124, 122 K+ 5.2, 4.4, 3.9, 4.3 Mag 1.7 >> 1.8 BUN/Creat 22/1.25 >>> 1.17 BNP 741 > 502 HS Trop 24, 21 WBC 16 > 10.7 H/H 12/34 Plts 258 TSH 3.121  She has gotten IV lasix   Generally over the years she has felt pretty decent Still working as a designer, industrial/product, has a exercise track at work that she walked regularly with good exertional capacity most of the time, despite her declining LVEF, in the last year or so, perhaps a little more DOE  Since her epicardial lead placement she never really felt like she got back on her feet, attributed it to the surgery, time for recovery. Then perhaps 2/2 Afib, but much more easily winded with less exertion. Never has had resting SOB until the last few days.  Today she is much more comfortable  Has had some CP that she located at her procedure sites, though of late some lower rib boarder CP with inspiration  No bleeding, no near  syncope or syncope   Past Medical History:  Diagnosis Date   AICD (automatic cardioverter/defibrillator) present    St. Jude   Anxiety    takes Klonopin  daily as needed   Arthritis    Back pain    reason unknown   Chronic systolic CHF (congestive heart failure) (HCC)    a. Echo 10/2013: EF 40-45 (apical images 35-40%); b. Echo 12/2014: EF 35-40%; c. 06/2020 Echo: EF 25-30%, glob HK, Gr2 DD, RVSP ~ ; d. 10/2020 Echo: EF 25-30%,  glob HK. Nl RV fxn, nl PASP, mildly dil LA. Small peric eff, triv MR.   Complete AV block (HCC)    a. s/p pacemaker insertion 1996; b. s/p contralateral side St. Jude generator change 11/2014.   Dizziness    occasionally   GERD (gastroesophageal reflux disease)    takes Zantac  daily as needed   Gout    Headache    occasionally   History of blood clots    in heart-was on Coumadin---this many yrs ago   History of colon polyps    benign   Hyperlipidemia    takes Lovastatin  daily   Hypertension    hx of-since hip replacement MD took resident off meds 10/30/14 b/c well controlled   Hypothyroid    takes Synthroid  daily   Joint pain    Joint swelling    Moderate Pericardial effusion    a. 07/2020 Echo: Mod effusion w/o tamponade; b. 10/2020 Echo: small peric eff w/o tamponade.   NICM (nonischemic cardiomyopathy) (HCC)    a. tachycardia induced->EF 40-45% (2010), 35-40% (10/2013), 35-40% Gr1 DD, mod TR (12/2014);  b. 01/2015 Cath: Nl cors, EF 30-35%, glob HK; c. 06/2020 Echo: EF 25-30%; d. 10/2020 Echo: EF 25-30%.   Numbness and tingling    left leg and right arm   Ovarian cancer (HCC)    ovarian, skin    Pacemaker lead failure--noise on atrial greater than ventricular lead with ventricular backup pulse pacing 10/17/2014   Peripheral artery disease    stent on lt illiac    Past Surgical History:  Procedure Laterality Date   ABDOMINAL HYSTERECTOMY     ANGIOPLASTY / STENTING ILIAC     BIV UPGRADE N/A 12/29/2023   Procedure: BIV UPGRADE;  Surgeon: Cindie [name] DASEN, MD;  Location: [facility] INVASIVE CV LAB;  Service: Cardiovascular;  Laterality: N/A;   CARDIAC CATHETERIZATION N/A 02/08/2015   Procedure: Left Heart Cath and Coronary Angiography;  Surgeon: Deatrice DELENA Cage, MD;  Location: [facility] INVASIVE CV LAB;  Service: Cardiovascular;  Laterality: N/A;   CARDIOVERSION N/A 03/07/2024   Procedure: CARDIOVERSION;  Surgeon: Raford Riggs, MD;  Location: [facility] INVASIVE CV LAB;  Service: Cardiovascular;   Laterality: N/A;   CERVICAL CERCLAGE  1994   COLONOSCOPY     DG BARIUM SWALLOW ([facility] HX)     DILATION AND CURETTAGE OF UTERUS     EP IMPLANTABLE DEVICE Left 12/06/2014   Procedure: Implantation of cardiac resynchronization therapy pacemaker;  Surgeon: Danelle LELON Birmingham, MD;  Location: [facility] OR;  Service: Cardiovascular;  Laterality: Left;   EPICARDIAL PACING LEAD PLACEMENT N/A 02/05/2024   Procedure: INSERTION, EPICARDIAL ELECTRODE LEAD;  Surgeon: Lucas Dorise POUR, MD;  Location: [facility] OR;  Service: Thoracic;  Laterality: N/A;   HERNIA MESH REMOVAL Right    incisional   HERNIA REPAIR     ILIAC ARTERY STENT  04/05/2010   Left artery stenting   INSERT / REPLACE / REMOVE PACEMAKER     JOINT REPLACEMENT  10/31/2014  hip   KNEE SURGERY Right    LEAD INSERTION N/A 12/29/2023   Procedure: LEAD INSERTION;  Surgeon: Cindie [name] DASEN, MD;  Location: [facility] INVASIVE CV LAB;  Service: Cardiovascular;  Laterality: N/A;   PACEMAKER INSERTION  2012   Had replaced in July 2012, first placed in 1996   PACEMAKER LEAD REMOVAL Right 12/06/2014   Procedure: REMOVAL OF RV AND RA PACEMAKER LEADS;  Surgeon: Danelle LELON Birmingham, MD;  Location: [facility] OR;  Service: Cardiovascular;  Laterality: Right;  DR. BARTLE TO BACK UP    PACEMAKER PLACEMENT Left 12/06/2014   THORACOTOMY Left 02/05/2024   Procedure: THORACOTOMY, MAJOR;  Surgeon: Lucas Dorise POUR, MD;  Location: [facility] OR;  Service: Thoracic;  Laterality: Left;   TOTAL ABDOMINAL HYSTERECTOMY W/ BILATERAL SALPINGOOPHORECTOMY  07/2008   Ovarian cancer   TOTAL HIP ARTHROPLASTY Right 10/31/2014   Procedure: TOTAL HIP ARTHROPLASTY ANTERIOR APPROACH;  Surgeon: Ozell Flake, MD;  Location: [facility] ORS;  Service: Orthopedics;  Laterality: Right;     Home Medications:  Prior to Admission medications   Medication Sig Start Date End Date Taking? Authorizing Provider  acetaminophen  (TYLENOL ) 325 MG tablet Take 2 tablets (650 mg total) by mouth every 6 (six) hours as needed for mild pain  (pain score 1-3). 02/08/24  Yes Raguel Con RAMAN, PA-C  apixaban  (ELIQUIS ) 5 MG TABS tablet Take 1 tablet (5 mg total) by mouth 2 (two) times daily. 02/10/24  Yes Terra Fairy PARAS, PA-C  carvedilol  (COREG ) 6.25 MG tablet Take 1 tablet (6.25 mg total) by mouth 2 (two) times daily. 11/19/23  Yes Cindie [name] DASEN, MD  Cholecalciferol (VITAMIN D3) 25 MCG (1000 UT) CAPS Take 1,000 Units by mouth at bedtime.   Yes [provider]  clonazePAM  (KLONOPIN ) 0.5 MG tablet TAKE 1 TABLET BY MOUTH ONCE DAILY AS NEEDED FOR ANXIETY/ SLEEP 09/07/19  Yes Stuart Vernell Norris, PA-C  cyanocobalamin (VITAMIN B12) 1000 MCG tablet Take 1,000 mcg by mouth at bedtime.   Yes [provider]  furosemide  (LASIX ) 20 MG tablet Take 20 mg by mouth as needed for fluid. 01/15/24 01/14/25 Yes [provider]  levothyroxine  (SYNTHROID ) 112 MCG tablet Take 1 tablet by mouth once daily 04/28/20  Yes Cannady, Jolene T, NP  lovastatin  (MEVACOR ) 20 MG tablet TAKE 1 TABLET BY MOUTH AT BEDTIME 03/29/20  Yes Johnson, Megan P, DO  oxyCODONE  (OXY IR/ROXICODONE ) 5 MG immediate release tablet Take 1 tablet (5 mg total) by mouth every 6 (six) hours as needed for severe pain (pain score 7-10). 02/08/24  Yes Raguel Con RAMAN, PA-C  potassium chloride  (KLOR-CON  M) 10 MEQ tablet Take 1 tablet (10 mEq total) by mouth daily. Patient taking differently: Take 10 mEq by mouth as needed (take with Lasix ). 03/01/24  Yes Rutha Manuelita HERO, PA-C  spironolactone  (ALDACTONE ) 25 MG tablet Take 12.5 mg by mouth daily.   Yes [provider]    Scheduled Meds:  apixaban   5 mg Oral BID   carvedilol   6.25 mg Oral BID WC   Chlorhexidine  Gluconate Cloth  6 each Topical Daily   levothyroxine   112 mcg Oral QHS   pravastatin   20 mg Oral q1800   spironolactone   12.5 mg Oral Daily   Continuous Infusions:  PRN Meds: acetaminophen , clonazePAM , docusate sodium , mouth rinse, oxyCODONE , perflutren lipid microspheres (DEFINITY) IV  suspension, polyethylene glycol  Allergies:    Allergies  Allergen Reactions   Marshmallow [Althaea Officinalis]     Rash, hives, itching, SOB  Pt states this is  no longer an issue for her   Other     Jello - Rash, hives, itching, SOB  Pt states is no longer an issue for her   Contrast Media [Iodinated Contrast Media]     unknown   Latex Rash   Nickel Rash    Social History:   Social History   Socioeconomic History   Marital status: Married    Spouse name: Not on file   Number of children: Not on file   Years of education: Not on file   Highest education level: Not on file  Occupational History   Not on file  Tobacco Use   Smoking status: Former    Current packs/day: 0.00    Types: Cigarettes    Quit date: 07/17/2008    Years since quitting: 15.6   Smokeless tobacco: Never   Tobacco comments:    Former smoker 02/10/24  Vaping Use   Vaping status: Never Used  Substance and Sexual Activity   Alcohol use: Yes    Comment: rum daily-previously drank beer daily   Drug use: No   Sexual activity: Not Currently    Birth control/protection: Surgical  Other Topics Concern   Not on file  Social History Narrative   Not on file   Social Drivers of Health   Financial Resource Strain: Low Risk  (08/19/2023)   Received from [facility]   Overall Financial Resource Strain (CARDIA)    Difficulty of Paying Living Expenses: Not hard at all  Food Insecurity: No Food Insecurity (03/16/2024)   Hunger Vital Sign    Worried About Running Out of Food in the Last Year: Never true    Ran Out of Food in the Last Year: Never true  Transportation Needs: No Transportation Needs (03/16/2024)   PRAPARE - Administrator, Civil Service (Medical): No    Lack of Transportation (Non-Medical): No  Physical Activity: Not on file  Stress: Not on file  Social Connections: Socially Integrated (03/16/2024)   Social Connection and Isolation Panel    Frequency of  Communication with Friends and Family: More than three times a week    Frequency of Social Gatherings with Friends and Family: Once a week    Attends Religious Services: More than 4 times per year    Active Member of [employer] or Organizations: Yes    Attends Engineer, Structural: More than 4 times per year    Marital Status: Married  Catering Manager Violence: Not At Risk (03/16/2024)   Humiliation, Afraid, Rape, and Kick questionnaire    Fear of Current or Ex-Partner: No    Emotionally Abused: No    Physically Abused: No    Sexually Abused: No    Family History:   Family History  Problem Relation Age of Onset   Hyperlipidemia Mother    Polymyalgia rheumatica Mother    Cancer Father        died of lung cancer   Lung cancer Father    Breast cancer Sister    Breast cancer Paternal Aunt    Diabetes Brother    Arrhythmia Son      ROS:  Please see the history of present illness.  All other ROS reviewed and negative.     Physical Exam/Data: Vitals:   03/16/24 0930 03/16/24 1023 03/16/24 1100 03/16/24 1119  BP:  100/61 105/68   Pulse:  71 70   Resp:  13 14   Temp:    98.1 F (36.7  C)  TempSrc:    Oral  SpO2:  95% 97%   Weight: 82.4 kg       Intake/Output Summary (Last 24 hours) at 03/16/2024 1150 Last data filed at 03/16/2024 0930 Gross per 24 hour  Intake 838 ml  Output 1100 ml  Net -262 ml      03/16/2024    9:30 AM 03/01/2024    2:49 PM 03/01/2024    1:50 PM  Last 3 Weights  Weight (lbs) 181 lb 10.5 oz 194 lb 192 lb 6.4 oz  Weight (kg) 82.4 kg 87.998 kg 87.272 kg     Body mass index is 31.18 kg/m.  General:  Well nourished, well developed, in no acute distress HEENT: normal Neck: no JVD Vascular: No carotid bruits; Distal pulses 2+ bilaterally Cardiac:  RRR (paced); no murmurs, gallops or rubs Lungs: diminished left to just about midlung, CTA on the right, no wheezing, rhonchi or rales  Abd: soft, nontender Ext: no edema Musculoskeletal:  No  deformities Skin: warm and dry  Neuro: no focal abnormalities noted Psych:  Normal affect   EKG:  The EKG was personally reviewed and demonstrates:    AFib/flutter, V paced (RBBB morphology)  Telemetry:  Telemetry was personally reviewed and demonstrates:   AFlutter, V paced, occ PVCs  Relevant CV Studies:  10/16/20: TTE 1. Left ventricular ejection fraction, by estimation, is 25 to 30%. The  left ventricle has severely decreased function. The left ventricle  demonstrates global hypokinesis. The left ventricular internal cavity size  was severely dilated. Left ventricular  diastolic parameters are indeterminate. The average left ventricular  global longitudinal strain is -8.8 %. The global longitudinal strain is  abnormal.   2. Right ventricular systolic function is normal. The right ventricular  size is normal. There is normal pulmonary artery systolic pressure.   3. Left atrial size was mildly dilated.   4. A small pericardial effusion is present. The pericardial effusion is  circumferential. There is no evidence of cardiac tamponade.   5. The mitral valve is normal in structure. Trivial mitral valve  regurgitation. No evidence of mitral stenosis.   6. The aortic valve has an indeterminant number of cusps. Aortic valve  regurgitation is not visualized. No aortic stenosis is present.   7. The inferior vena cava is normal in size with greater than 50%  respiratory variability, suggesting right atrial pressure of 3 mmHg.   Laboratory Data: High Sensitivity Troponin:   Recent Labs  Lab 03/15/24 0836 03/15/24 1022  TROPONINIHS 24* 21*     Chemistry Recent Labs  Lab 03/15/24 0836 03/15/24 1204 03/15/24 1928 03/16/24 0250  NA 119* 118* 120* 124*  K 5.2* 4.4  --  3.9  CL 84* 86*  --  85*  CO2 21* 22  --  26  GLUCOSE 168* 132*  --  103*  BUN 22 19  --  20  CREATININE 1.25* 1.10*  --  1.10*  CALCIUM  9.5 9.0  --  8.9  MG 1.7  --   --  1.7  GFRNONAA 48* 55*  --  55*   ANIONGAP 14 10  --  13    Recent Labs  Lab 03/15/24 1928  PROT 6.7   Lipids No results for input(s): CHOL, TRIG, HDL, LABVLDL, LDLCALC, CHOLHDL in the last 168 hours.  Hematology Recent Labs  Lab 03/15/24 0836 03/16/24 0250  WBC 16.0* 10.7*  RBC 4.39 3.98  HGB 13.7 12.4  HCT 38.7 34.5*  MCV 88.2 86.7  MCH 31.2 31.2  MCHC 35.4 35.9  RDW 13.0 13.1  PLT 303 258   Thyroid   Recent Labs  Lab 03/16/24 0250  TSH 3.121    BNP Recent Labs  Lab 03/15/24 0836  BNP 741.8*    DDimer No results for input(s): DDIMER in the last 168 hours.  Radiology/Studies:  DG Chest Port 1 View Result Date: 03/15/2024 EXAM: 1 VIEW(S) XRAY OF THE CHEST 03/15/2024 07:10:00 PM COMPARISON: 03/15/2024 CLINICAL HISTORY: S/P thoracentesis FINDINGS: LINES, TUBES AND DEVICES: Left chest Automatic Implantable Cardioverter Defibrillator (AICD) with leads stable in position. LUNGS AND PLEURA: Decreased small to moderate left pleural effusion. Associated left basilar retrocardiac opacification. No pneumothorax. HEART AND MEDIASTINUM: Cardiomegaly. Atherosclerotic calcifications of the aorta. BONES AND SOFT TISSUES: No acute osseous abnormality. IMPRESSION: 1. Decreased small to moderate left pleural effusion with associated left basilar retrocardiac opacification. 2. Cardiomegaly. Electronically signed by: Dorethia Molt MD 03/15/2024 07:41 PM EST RP Workstation: [id number]     Assessment and Plan:  Persistent AFib AFlutter CHA2DS2Vasc is 4, on Eliquis  DCCV 03/07/24  In AFlutter on admission  In the environment of worsening pleural effusion/volume. Rate controlled AFib I would not suspect to be the culprit of a large left effusion, perhaps more a symptom of it. She is rate controlled, VSS Would continue management of the effusion/volume OL And consider re-attempt rhythm control once euvolemic  In d/w Dr. Shlomo, concerns that the effusion perhaps not primarily/entirely cardiac  driven Echo is pending Defer to cards, CCM/IM teams  ICD interrogation from this morning (incomplete report) Battery and lead measurements are good Current AFib episode ongoing since Dec 1 CorVue is actually trending up   In d/w rep >> has had no VT  EP MD will see for final recommendations   ADEND: Dr. Kennyth was bedside, discussed a number of options with her   --No new med/AAD, with some suspicion that her AFib likely triuggered 2/2 acute stressors, DCCV once more clinically stable/effusion resolved/stable.  -- AAD Amiodarone  as a short term option to an ablation  Amiodarone  as a short term option to get her past this acute phase illness Tikosyn  I have circled back this afternoon, revisited all the options PROs and CONs of each medication, options and discussed more ablation Answered all of her questions. She is undecided, but really does not think she will want to spend the 3.5 days in the hospital for a Tikosyn load. Leaning towrds amiodarone  towards an ablation down the road. We will circle back around tomorrow   For questions or updates, please contact [facility] Please consult [email or web address] for contact info under    Signed, Charlies Macario Arthur, PA-C  03/16/2024 11:50 AM;  I have seen, examined the patient, and reviewed the above assessment and plan.    HPI: Ms. Margerum is a 68 year old female with a past medical history of CHB s/p dual chamber pacemaker, chronic systolic heart failure secondary to nonischemic cardiomyopathy, recent upgrade to dual chamber ICD followed by LV epicardial lead implant and upgrade to CRT-D, persistent A-Fib s/p recent DCCV 03/07/2024, PVD, aortic and coronary calcifications per CT, hypertension, hypothyrdoism who presented to ED on 03/15/24 with shortness of breath, chest discomfort, orthopnea x 4-5 days.  She was found to have large left-sided pleural effusion.  She underwent thoracentesis with 1.3 L removed.  This correlated with  significant symptomatic improvement.  Device interrogation was performed and she was found to be back in atrial fibrillation.  EP has been consulted to  assist with management.  General: Well developed, in no acute distress.  Neck: No JVD.  Cardiac: Normal rate, regular rhythm.  Resp: Normal work of breathing.  Ext: No edema.  Neuro: No gross focal deficits.  Psych: Normal affect.   ECG 03/15/24:    Echo 03/17/24:   1. Left ventricular ejection fraction, by estimation, is 20 to 25%. The  left ventricle has severely decreased function. The left ventricle  demonstrates global hypokinesis. The left ventricular internal cavity size  was severely dilated. There is severe  eccentric left ventricular hypertrophy. Left ventricular diastolic  parameters are consistent with Grade III diastolic dysfunction  (restrictive). There is severe global hypokinesis with apical akinesis and  swerlling of contrast concerning for early  thrombus formation.   2. Right ventricular systolic function is mildly reduced. The right  ventricular size is normal. There is normal pulmonary artery systolic  pressure.   3. Left atrial size was mildly dilated.   4. Lead in RA.   5. The mitral valve is normal in structure. No evidence of mitral valve  regurgitation.   6. At least moderate TR possibly in setting of lead impingement.  Tricuspid valve regurgitation is moderate.   7. The aortic valve is normal in structure. Aortic valve regurgitation is  not visualized.   8. The inferior vena cava is dilated in size with <50% respiratory  variability, suggesting right atrial pressure of 15 mmHg.   Assessment and Plan: Ms. Aura is a 68 year old female with past medical history of complete heart block status post dual-chamber pacemaker originally who subsequently developed systolic heart failure secondary to nonischemic cardiomyopathy.  She initially underwent upgrade to a dual-chamber ICD, but unfortunately coronary sinus  lead could not be placed.  She then subsequently underwent LV epicardial lead placement and upgrade to CRT-D in October 2025.  Post operatively she developed atrial fibrillation which was discovered by her device.  She underwent cardioversion to sinus rhythm on 03/07/2024.  She is currently admitted with shortness of breath and was found to have large left-sided pleural effusion, likely related to her recent surgery.  Also discovered to be back in atrial fibrillation.  #Persistent atrial fibrillation: #Hypercoagulable state due to AF: Her options for AF management at present are limited due to her reduced LVEF.  We discussed Tikosyn and amiodarone.  We also discussed possibility of AF ablation in the future.  Discussed risk and benefits of each option.  Patient would like to think about her options and will let us  know how she would like to proceed.  Continue therapeutic anticoagulation in setting of recent cardioversion.  Fonda Kitty, MD 03/17/2024 9:26 PM

## 2024-03-16 NOTE — Progress Notes (Signed)
 [facility] ADULT ICU REPLACEMENT PROTOCOL   The patient does apply for the [facility] Adult ICU Electrolyte Replacment Protocol based on the criteria listed below:   1.Exclusion criteria: TCTS, ECMO, Dialysis, and Myasthenia Gravis patients 2. Is GFR >/= 30 ml/min? Yes.    Patient's GFR today is 55 3. Is SCr </= 2? Yes.   Patient's SCr is 1.10 mg/dL 4. Did SCr increase >/= 0.5 in 24 hours? No. 5.Pt's weight >40kg  Yes.   6. Abnormal electrolyte(s): Mag 1.7  7. Electrolytes replaced per protocol 8.  Call MD STAT for K+ </= 2.5, Phos </= 1, or Mag </= 1 Physician:  Haze Ole BIRCH [facility] 03/16/2024 4:29 AM

## 2024-03-16 NOTE — Progress Notes (Signed)
 NAME:  [name] [name], MRN:  [id number], DOB:  10/30/1955, LOS: 1 ADMISSION DATE:  03/15/2024, CONSULTATION DATE:  12/2 REFERRING MD:  Sherlean Carota PA-C CHIEF COMPLAINT:  Hyponatremia   History of Present Illness:  [name] [name] is a 68 year old female with a pertinent past medical history of HFrEF (EF 20-25% via TEE on 11/24), AV block s/p permanent pacemaker, persistent atrial fibrillation s/p cardioversion 11/24, HTN, hypothyroidism who presented to [facility] ED with SOB and chest discomfort over the past week that acutely worsened over the last few days. She was unable to sleep due to her SOB and noted it worsened with exertion. She has concurrent chest tightness especially when she inhales deeply, however this does not radiate and describes it as a dull pain. She denies recent illness, N/V, or other pain and has been able to adhere to all her medications.   When evaluated in the ED, she was found to have severe hyponatremia of 119, leukocytosis of 16, UA showing bacteria but denied symptoms. PCCM was consulted to admit patient for severe hyponatremia and management.   Pertinent  Medical History   Past Medical History:  Diagnosis Date   AICD (automatic cardioverter/defibrillator) present    St. Jude   Anxiety    takes Klonopin  daily as needed   Arthritis    Back pain    reason unknown   Chronic systolic CHF (congestive heart failure) (HCC)    a. Echo 10/2013: EF 40-45 (apical images 35-40%); b. Echo 12/2014: EF 35-40%; c. 06/2020 Echo: EF 25-30%, glob HK, Gr2 DD, RVSP ~ ; d. 10/2020 Echo: EF 25-30%, glob HK. Nl RV fxn, nl PASP, mildly dil LA. Small peric eff, triv MR.   Complete AV block (HCC)    a. s/p pacemaker insertion 1996; b. s/p contralateral side St. Jude generator change 11/2014.   Dizziness    occasionally   GERD (gastroesophageal reflux disease)    takes Zantac  daily as needed   Gout    Headache    occasionally   History of blood clots    in heart-was on Coumadin---this  many yrs ago   History of colon polyps    benign   Hyperlipidemia    takes Lovastatin  daily   Hypertension    hx of-since hip replacement MD took resident off meds 10/30/14 b/c well controlled   Hypothyroid    takes Synthroid  daily   Joint pain    Joint swelling    Moderate Pericardial effusion    a. 07/2020 Echo: Mod effusion w/o tamponade; b. 10/2020 Echo: small peric eff w/o tamponade.   NICM (nonischemic cardiomyopathy) (HCC)    a. tachycardia induced->EF 40-45% (2010), 35-40% (10/2013), 35-40% Gr1 DD, mod TR (12/2014);  b. 01/2015 Cath: Nl cors, EF 30-35%, glob HK; c. 06/2020 Echo: EF 25-30%; d. 10/2020 Echo: EF 25-30%.   Numbness and tingling    left leg and right arm   Ovarian cancer (HCC)    ovarian, skin    Pacemaker lead failure--noise on atrial greater than ventricular lead with ventricular backup pulse pacing 10/17/2014   Peripheral artery disease    stent on lt illiac   Significant Hospital Events: Including procedures, antibiotic start and stop dates in addition to other pertinent events   12/2- Admitted to ICU for management of AoC HFrEF with severe hyponatremia of 119 12/2- Thoracentesis removing 1.3L of yellow fluid. LDH 61, total protein 4.3.   Interim History / Subjective:  Patient feels well this morning, eating breakfast during my  evaluation. Has some tenderness at thoracentesis site, but otherwise stable.  Objective   Blood pressure 107/75, pulse 70, temperature 97.6 F (36.4 C), temperature source Axillary, resp. rate 16, weight 82.4 kg, SpO2 95%.        Intake/Output Summary (Last 24 hours) at 03/16/2024 1027 Last data filed at 03/16/2024 0800 Gross per 24 hour  Intake 290 ml  Output 1100 ml  Net -810 ml   Filed Weights   03/16/24 0930  Weight: 82.4 kg    Examination: General: Age-appropriate female, NAD HENT: [place]/AT, Moist mucous membranes, no JVD Respiratory: slight crackles at bilateral bases, no wheezing or rales Cardiovascular: irregular rhythm,  no murmurs/rubs/gallops Abdomen: soft, bowel sounds +, no tenderness, no distension  MSK: slight tenderness to left flank around thoracentesis site, no erythema, swelling or bruising Extremities: dry, no edema, bilateral UE/LE 4/5 strength  Neuro: A&Ox4, awake. Pleasant, follows commands.  Pertinent labs/imaging/procedures in past 24 hours: Na: 119 > 118 > 120 > 124 K: 3.9 Cl: 85 BUN/Cr: 20/1.10 Mag: 1.7 Phosphorus:  4.4  WBC: 10.7 H/H: 12.4/ 34.5 Plts: 258  BNP 740 Troponins: 24  Net urine output: -860 cc  CXR 12/2 showing worsening opacification over the left retrocardiac region with a moderate pleural effusion, likely associated basilar atelectasis  Thoracentesis performed, LDH serum 117, LDH pleural fluid 61, total protein 6.7, pleural fluid protein 4.3   Resolved problem list   Assessment and Plan   Respiratory: Left Pleural Effusion  Shortness of Breath POA P: - Multiple CXR historically have shown pleural effusions with retrocardiac opacification of left lower lobe, associated with atelectasis - Thoracentesis on 12/2 removed 1.3L of yellow fluid, pleural fluid with LDH elevated at 61 and total protein 4.3 - Consistent with transudative effusion, likely associated with CHF exacerbation. Pending cytology of pleural fluid - Will continue to diurese with Lasix  40mg . Patient on Lasix  20mg  PRN at home, will likely need to be on 40mg  daily at discharge - Would benefit from CT Chest to assess for other causes for recurrent pleural effusions if persistent after diuresis, will hold for now  - Pulmonary hygiene with incentive spirometry  Infection Possible UTI P: - UA showed bacteria in ED, patient states she is asymptomatic - Urine culture pending  Cardiac Severe Symptomatic Hypervolemic Hyponatremia  2/2 Acute on Chronic HFrEF s/p ACID Elevated BNP P: - BNP on admission 740, patient with known history of HF with last echo 2022 showing EF 30-35%.  - In October 2025,  had left thoracotomy for LV epicardial lead placement. CXR at the time showed persistent left pleural effusion with retrocardiac opacification of left lower lobe. Thought to be atelectasis or airspace disease. - Underwent recent TEE on 11/24 for cardioversion, and EF noted to be 20-25% at that time. - Cardiology following, suspect recurrent atrial fibrillation/flutter is triggering recurrent pleural effusion. Potentially contributing to CHF causing severe hyponatremia  - Pending repeat 2D echo, TSH  - Will recheck BMP, BNP, Mag, and osmol - Continue to monitor intake/output - Continue fluid restriction to 1500 per 24 hours - Continue diuresis with Lasix  40 mg today - Will continue GDMT as tolerated with Carvedilol , Spironolactone , Lasix .  - Trend serum sodium every 12 hours  Persistent Atrial Fibrillation s/p Recent Cardioversion Complete AV Block s/p pacemaker P: - Underwent recent cardioversion on 11/24, however is currently in atrial flutter with ventricular pacing per EKG on admission - Continue home Eliquis  5mg  BID and Carvedilol  6.25mg  BID  HTN HLD P: - BP currently controlled  on Carvedilol  6.25mg  BID and Spironolactone  12.5mg  daily - Continue home antihypertensive regimen - LDL goal < 100, last LDL 71 in May 2025, will continue home Lovastatin  20mg  daily  Renal Acute AKI, improving P:  - Cr on admission 1.25, baseline 0.75-0.9 - After diuresis, Cr improving this morning at 1.10 - Continue to monitor with daily BMP - Avoid nephrotoxic agents  Endocrine: Hypothyroidism P: - Longstanding, patient on levothyroxine  at home - Will continue Levothyroxine   - Check TSH  Tubes/Lines/Therapy Tubes: none Lines: Right PIV DVT Ppx: Eliquis  5mg  BID GI PPX: None  Labs   CBC: Recent Labs  Lab 03/15/24 0836 03/16/24 0250  WBC 16.0* 10.7*  HGB 13.7 12.4  HCT 38.7 34.5*  MCV 88.2 86.7  PLT 303 258    Basic Metabolic Panel: Recent Labs  Lab 03/15/24 0836 03/15/24 1204  03/15/24 1928 03/16/24 0250  NA 119* 118* 120* 124*  K 5.2* 4.4  --  3.9  CL 84* 86*  --  85*  CO2 21* 22  --  26  GLUCOSE 168* 132*  --  103*  BUN 22 19  --  20  CREATININE 1.25* 1.10*  --  1.10*  CALCIUM  9.5 9.0  --  8.9  MG 1.7  --   --  1.7  PHOS  --   --   --  4.4   GFR: Estimated Creatinine Clearance: 52.3 mL/min (A) (by C-G formula based on SCr of 1.1 mg/dL (H)). Recent Labs  Lab 03/15/24 0836 03/16/24 0250  WBC 16.0* 10.7*    Liver Function Tests: Recent Labs  Lab 03/15/24 1928  PROT 6.7   No results for input(s): LIPASE, AMYLASE in the last 168 hours. No results for input(s): AMMONIA in the last 168 hours.  ABG    Component Value Date/Time   TCO2 28 03/07/2024 0931   CBG: Recent Labs  Lab 03/15/24 1529  GLUCAP 161*   Past Medical History:  She,  has a past medical history of AICD (automatic cardioverter/defibrillator) present, Anxiety, Arthritis, Back pain, Chronic systolic CHF (congestive heart failure) (HCC), Complete AV block (HCC), Dizziness, GERD (gastroesophageal reflux disease), Gout, Headache, History of blood clots, History of colon polyps, Hyperlipidemia, Hypertension, Hypothyroid, Joint pain, Joint swelling, Moderate Pericardial effusion, NICM (nonischemic cardiomyopathy) (HCC), Numbness and tingling, Ovarian cancer (HCC), Pacemaker lead failure--noise on atrial greater than ventricular lead with ventricular backup pulse pacing (10/17/2014), and Peripheral artery disease.   Surgical History:   Past Surgical History:  Procedure Laterality Date   ABDOMINAL HYSTERECTOMY     ANGIOPLASTY / STENTING ILIAC     BIV UPGRADE N/A 12/29/2023   Procedure: BIV UPGRADE;  Surgeon: Cindie Ole DASEN, MD;  Location: [facility] INVASIVE CV LAB;  Service: Cardiovascular;  Laterality: N/A;   CARDIAC CATHETERIZATION N/A 02/08/2015   Procedure: Left Heart Cath and Coronary Angiography;  Surgeon: Deatrice DELENA Cage, MD;  Location: [facility] INVASIVE CV LAB;  Service:  Cardiovascular;  Laterality: N/A;   CARDIOVERSION N/A 03/07/2024   Procedure: CARDIOVERSION;  Surgeon: Raford Riggs, MD;  Location: [facility] INVASIVE CV LAB;  Service: Cardiovascular;  Laterality: N/A;   CERVICAL CERCLAGE  1994   COLONOSCOPY     DG BARIUM SWALLOW ([facility] HX)     DILATION AND CURETTAGE OF UTERUS     EP IMPLANTABLE DEVICE Left 12/06/2014   Procedure: Implantation of cardiac resynchronization therapy pacemaker;  Surgeon: Danelle LELON Birmingham, MD;  Location: [facility] OR;  Service: Cardiovascular;  Laterality: Left;   EPICARDIAL PACING LEAD PLACEMENT N/A  02/05/2024   Procedure: INSERTION, EPICARDIAL ELECTRODE LEAD;  Surgeon: Lucas Dorise POUR, MD;  Location: [facility] OR;  Service: Thoracic;  Laterality: N/A;   HERNIA MESH REMOVAL Right    incisional   HERNIA REPAIR     ILIAC ARTERY STENT  04/05/2010   Left artery stenting   INSERT / REPLACE / REMOVE PACEMAKER     JOINT REPLACEMENT  10/31/2014   hip   KNEE SURGERY Right    LEAD INSERTION N/A 12/29/2023   Procedure: LEAD INSERTION;  Surgeon: Cindie Ole DASEN, MD;  Location: [facility] INVASIVE CV LAB;  Service: Cardiovascular;  Laterality: N/A;   PACEMAKER INSERTION  2012   Had replaced in July 2012, first placed in 1996   PACEMAKER LEAD REMOVAL Right 12/06/2014   Procedure: REMOVAL OF RV AND RA PACEMAKER LEADS;  Surgeon: Danelle LELON Birmingham, MD;  Location: [facility] OR;  Service: Cardiovascular;  Laterality: Right;  DR. BARTLE TO BACK UP    PACEMAKER PLACEMENT Left 12/06/2014   THORACOTOMY Left 02/05/2024   Procedure: THORACOTOMY, MAJOR;  Surgeon: Lucas Dorise POUR, MD;  Location: [facility] OR;  Service: Thoracic;  Laterality: Left;   TOTAL ABDOMINAL HYSTERECTOMY W/ BILATERAL SALPINGOOPHORECTOMY  07/2008   Ovarian cancer   TOTAL HIP ARTHROPLASTY Right 10/31/2014   Procedure: TOTAL HIP ARTHROPLASTY ANTERIOR APPROACH;  Surgeon: Ozell Flake, MD;  Location: [facility] ORS;  Service: Orthopedics;  Laterality: Right;     Social History:   reports that she quit smoking about 15  years ago. Her smoking use included cigarettes. She has never used smokeless tobacco. She reports current alcohol use. She reports that she does not use drugs.   Family History:  Her family history includes Arrhythmia in her son; Breast cancer in her paternal aunt and sister; Cancer in her father; Diabetes in her brother; Hyperlipidemia in her mother; Lung cancer in her father; Polymyalgia rheumatica in her mother.   Allergies Allergies  Allergen Reactions   Marshmallow [Althaea Officinalis]     Rash, hives, itching, SOB  Pt states this is no longer an issue for her   Other     Jello - Rash, hives, itching, SOB  Pt states is no longer an issue for her   Contrast Media [Iodinated Contrast Media]     unknown   Latex Rash   Nickel Rash     Home Medications  Prior to Admission medications   Medication Sig Start Date End Date Taking? Authorizing Provider  acetaminophen  (TYLENOL ) 325 MG tablet Take 2 tablets (650 mg total) by mouth every 6 (six) hours as needed for mild pain (pain score 1-3). 02/08/24  Yes Raguel Con RAMAN, PA-C  apixaban  (ELIQUIS ) 5 MG TABS tablet Take 1 tablet (5 mg total) by mouth 2 (two) times daily. 02/10/24  Yes Terra Fairy PARAS, PA-C  carvedilol  (COREG ) 6.25 MG tablet Take 1 tablet (6.25 mg total) by mouth 2 (two) times daily. 11/19/23  Yes Cindie Ole DASEN, MD  Cholecalciferol (VITAMIN D3) 25 MCG (1000 UT) CAPS Take 1,000 Units by mouth at bedtime.   Yes [provider]  clonazePAM  (KLONOPIN ) 0.5 MG tablet TAKE 1 TABLET BY MOUTH ONCE DAILY AS NEEDED FOR ANXIETY/ SLEEP 09/07/19  Yes Stuart Vernell Norris, PA-C  cyanocobalamin (VITAMIN B12) 1000 MCG tablet Take 1,000 mcg by mouth at bedtime.   Yes [provider]  furosemide  (LASIX ) 20 MG tablet Take 20 mg by mouth as needed for fluid. 01/15/24 01/14/25 Yes [provider]  levothyroxine  (SYNTHROID ) 112 MCG tablet Take 1  tablet by mouth once daily 04/28/20  Yes Cannady, Jolene T, NP   lovastatin  (MEVACOR ) 20 MG tablet TAKE 1 TABLET BY MOUTH AT BEDTIME 03/29/20  Yes Johnson, Megan P, DO  oxyCODONE  (OXY IR/ROXICODONE ) 5 MG immediate release tablet Take 1 tablet (5 mg total) by mouth every 6 (six) hours as needed for severe pain (pain score 7-10). 02/08/24  Yes Raguel Con RAMAN, PA-C  potassium chloride  (KLOR-CON  M) 10 MEQ tablet Take 1 tablet (10 mEq total) by mouth daily. Patient taking differently: Take 10 mEq by mouth as needed (take with Lasix ). 03/01/24  Yes Rutha Manuelita HERO, PA-C  spironolactone  (ALDACTONE ) 25 MG tablet Take 12.5 mg by mouth daily.   Yes [provider]     Critical care time: 41    Darragh Nay, DO Internal Medicine Resident, PGY-1 Please contact the on call pager at [phone] for any urgent or emergent needs. 10:27 AM 03/16/2024

## 2024-03-16 NOTE — Progress Notes (Signed)
  Echocardiogram 2D Echocardiogram has been performed.  Koleen KANDICE Popper, RDCS 03/16/2024, 10:38 AM

## 2024-03-16 NOTE — TOC CM/SW Note (Addendum)
 Transition of Care [facility]) - Inpatient Brief Assessment   Patient Details  Name: [name] [name] MRN: [id number] Date of Birth: January 22, 1956  [facility]) CM/SW Contact:    Lauraine FORBES Saa, LCSWA Phone Number: 03/16/2024, 11:44 AM   Clinical Narrative:  11:44 AM Per chart review, patient has a PCP and insurance. Patient has a PCP and insurance. Patient has SNF history with [facility]. Patient does not have HH/DME history. Patient's preferred pharmacy's are [facility], [facility], and [facility]. Per progressions, patient has transfer orders. No TOC needs identified at this time. TOC will continue to follow.  4:00 PM Bedside RN informed CSW that patient's employer requested UR to contact insurance company notifying them of admission for insurance to provide coverage. UR was made aware.  Transition of Care Asessment: Insurance and Status: Insurance coverage has been reviewed Patient has primary care physician: Yes Home environment has been reviewed: Private Residence Prior level of function:: N/A Prior/Current Home Services: No current home services Social Drivers of Health Review: SDOH reviewed no interventions necessary Readmission risk has been reviewed: Yes (Currently Yellow 13%) Transition of care needs: no transition of care needs at this time

## 2024-03-16 NOTE — Progress Notes (Addendum)
 Progress Note  Patient Name: [name] [name] Date of Encounter: 03/16/2024  [facility] Cardiologist: None   Patient Profile     Subjective   Patient underwent thoracentesis of 1.3 L of yellowish appearing fluid.  LDH elevated at 61 total protein 4.3  She diuresed 1.1 L yesterday and is net -860 cc of urine output since admission.  Sodium improved to 124 today; SCr 1.1 and K+ 3.9  ICD interrogation shows she is back in atrial fib/flutter.  Inpatient Medications    Scheduled Meds:  apixaban   5 mg Oral BID   carvedilol   6.25 mg Oral BID WC   Chlorhexidine  Gluconate Cloth  6 each Topical Daily   levothyroxine   112 mcg Oral QHS   pravastatin   20 mg Oral q1800   spironolactone   12.5 mg Oral Daily   Continuous Infusions:  PRN Meds: acetaminophen , clonazePAM , docusate sodium , oxyCODONE , polyethylene glycol   Vital Signs    Vitals:   03/16/24 0336 03/16/24 0400 03/16/24 0500 03/16/24 0600  BP:  106/65 119/73 104/62  Pulse:  67 69 68  Resp:  16 (!) 26 18  Temp: 98.3 F (36.8 C)     TempSrc: Oral     SpO2:  92% 95% 91%    Intake/Output Summary (Last 24 hours) at 03/16/2024 0733 Last data filed at 03/16/2024 0600 Gross per 24 hour  Intake 240 ml  Output 1100 ml  Net -860 ml      03/01/2024    2:49 PM 03/01/2024    1:50 PM 02/15/2024   12:31 PM  Last 3 Weights  Weight (lbs) 194 lb 192 lb 6.4 oz 188 lb  Weight (kg) 87.998 kg 87.272 kg 85.276 kg      Telemetry    NSR - Personally Reviewed  ECG    No new EKG to review- Personally Reviewed  Physical Exam   GEN: No acute distress.   Neck: No JVD Cardiac: RRR, no murmurs, rubs, or gallops.  Respiratory:decreased BS at bases with crackles at bases bilaterally GI: Soft, nontender, non-distended  MS: No edema; No deformity. Neuro:  Nonfocal  Psych: Normal affect   Labs    High Sensitivity Troponin:   Recent Labs  Lab 03/15/24 0836 03/15/24 1022  TROPONINIHS 24* 21*      Chemistry Recent Labs   Lab 03/15/24 0836 03/15/24 1204 03/15/24 1928 03/16/24 0250  NA 119* 118* 120* 124*  K 5.2* 4.4  --  3.9  CL 84* 86*  --  85*  CO2 21* 22  --  26  GLUCOSE 168* 132*  --  103*  BUN 22 19  --  20  CREATININE 1.25* 1.10*  --  1.10*  CALCIUM  9.5 9.0  --  8.9  PROT  --   --  6.7  --   GFRNONAA 48* 55*  --  55*  ANIONGAP 14 10  --  13     Hematology Recent Labs  Lab 03/15/24 0836 03/16/24 0250  WBC 16.0* 10.7*  RBC 4.39 3.98  HGB 13.7 12.4  HCT 38.7 34.5*  MCV 88.2 86.7  MCH 31.2 31.2  MCHC 35.4 35.9  RDW 13.0 13.1  PLT 303 258    BNP Recent Labs  Lab 03/15/24 0836  BNP 741.8*     DDimer No results for input(s): DDIMER in the last 168 hours.   CHA2DS2-VASc Score = 5   This indicates a 7.2% annual risk of stroke. The patient's score is based upon: CHF History:  1 HTN History: 1 Diabetes History: 0 Stroke History: 0 Vascular Disease History: 1 Age Score: 1 Gender Score: 1      Radiology    DG Chest Port 1 View Result Date: 03/15/2024 EXAM: 1 VIEW(S) XRAY OF THE CHEST 03/15/2024 07:10:00 PM COMPARISON: 03/15/2024 CLINICAL HISTORY: S/P thoracentesis FINDINGS: LINES, TUBES AND DEVICES: Left chest Automatic Implantable Cardioverter Defibrillator (AICD) with leads stable in position. LUNGS AND PLEURA: Decreased small to moderate left pleural effusion. Associated left basilar retrocardiac opacification. No pneumothorax. HEART AND MEDIASTINUM: Cardiomegaly. Atherosclerotic calcifications of the aorta. BONES AND SOFT TISSUES: No acute osseous abnormality. IMPRESSION: 1. Decreased small to moderate left pleural effusion with associated left basilar retrocardiac opacification. 2. Cardiomegaly. Electronically signed by: Dorethia Molt MD 03/15/2024 07:41 PM EST RP Workstation: [id number]   DG Chest 2 View Result Date: 03/15/2024 CLINICAL DATA:  Shortness of breath 2 days. EXAM: CHEST - 2 VIEW COMPARISON:  03/01/2024 FINDINGS: Left-sided pacemaker unchanged. Lungs are  adequately inflated demonstrate opacification over the left base/retrocardiac region with slight interval worsening compatible with moderate size effusion likely with associated basilar atelectasis. Right lung is clear. Stable cardiomegaly. Remainder of the exam is unchanged. IMPRESSION: 1. Slight interval worsening opacification over the left base/retrocardiac region compatible with moderate size effusion likely with associated basilar atelectasis. 2. Stable cardiomegaly. Electronically Signed   By: Toribio Agreste M.D.   On: 03/15/2024 09:06    Patient Profile     68 y.o. female with a hx of CHB s/p ICD, nonischemic cardiomyopathy, chronic HFrEF, persistent A-Fib s/p recent DCCV 03/07/2024, PVD, aortic and coronary calcifications per CT, hypertension, hypothyrdoism who is being seen 03/15/2024 for the evaluation of CHF at the request of Dr. Harold.   Assessment & Plan    Hyponatremia Left pleural effusion Nonischemic cardiomyopathy Shortness of breath Elevated BNP -Normal coronary arteries by cath 2016  -patient has a known history of nonischemic cardiomyopathy with most recent 2D echo in 2022 showing EF of 30 to 35% with GHK she has not had a 2D echo done since then - Status post attempted CRT-D upgrade by Dr. 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  RV ICD lead was implanted and generator changed out. - Chest x-ray at that time was clear - underwent left thoracotomy for LV epicardial lead placement 1025 and chest x-ray post thoracotomy showed persistent left pleural effusion with retrocardiac opacification of the left lower lobe and follow-up the next day showed retrocardiac left basilar opacity that was felt to be atelectasis or airspace disease. -  Follow-up chest x-ray 02/15/2024 showed improved left basilar airspace opacity with postop atelectasis and mild pulmonary venous congestion.   - Had recurrence of atrial fibrillation  on 10/29 and underwent cardioversion on 11/24 - Follow-up chest x-ray 11/18 showed worsening moderate size left effusion associate with compressive atelectasis in the left base. - Now admitted with 1 week history of worsening shortness of breath, orthopnea but no fever/chills/cough/lower extremity edema/increased abdominal fullness. - CBC initially elevated but now down to 10.6 from 16 yesterday - BNP mildly elevated  - Found to have a sodium level of 118 and low urine osmolality which goes against CHF - Check TSH - Her EKG is suspicious for recurrent atrial fibrillation/flutter which could be a trigger for recurrent pleural effusion - Status post thoracentesis yesterday removing 1.3 L of yellow appearing fluid>> follow-up chest x-ray showed decreased small to moderate left pleural effusion with associated basilar retrocardiac opacification.  Unclear if retrocardiac opacification is due to compressive atelectasis from her recent effusion.  Will defer to pulmonary -repeat 2D echo pending  -Overall she does not appear volume overloaded with no lower extremity edema or abdominal fullness and therefore not convinced that this pleural effusion is related to heart failure - PTA meds for GDMT including carvedilol  6.25 mg twice daily, Lasix  20 mg daily and spironolactone  12.5 mg daily.  She has been intolerant to SGLT2i and Entresto  was stopped at discharge in October per CT surgery due to hypotension - Continue carvedilol  6.25 mg twice daily and spironolactone  12.5 mg daily - repeat Cxray this am   Persistent atrial fibrillation Complete heart block  - Status post PPM in 1990s followed in A-fib clinic - Status post failed CRT-D upgrade in 10/25 with subsequent placement of RV ICD lead with generator change out and then subsequent left thoracotomy with LV epicardial lead placement 02/06/2024. - Noted to be in atrial fibrillation by device interrogation on 02/10/2024 - Ultimately underwent DCCV 11/24 -  Appears to be back in atrial flutter with V pacing on EKG>>documented on device interrogation this am - Continue carvedilol  6.25 mg twice daily and Eliquis  5 mg twice daily - will ask EP to see patient for further guidance   Hypertension - Controlled at 122/70 mmHg today - Continue carvedilol  6.25 mg twice daily and spironolactone  12.5 mg daily   Hyperlipidemia - LDL goal less than 100 - LDL 71 in 08/2023 - Continue lovastatin  20 mg daily    I spent 35 minutes caring for this patient today face to face, ordering and reviewing labs, reviewing records from chest x-ray 12-25 , seeing the patient, documenting in the record, and arranging for a 2D echo  For questions or updates, please contact [facility] Please consult [email or web address] for contact info under        Signed, Wilbert Bihari, MD  03/16/2024, 7:33 AM

## 2024-03-17 ENCOUNTER — Inpatient Hospital Stay (HOSPITAL_COMMUNITY): Payer: PRIVATE HEALTH INSURANCE

## 2024-03-17 DIAGNOSIS — I5043 Acute on chronic combined systolic (congestive) and diastolic (congestive) heart failure: Secondary | ICD-10-CM | POA: Diagnosis not present

## 2024-03-17 DIAGNOSIS — I4892 Unspecified atrial flutter: Secondary | ICD-10-CM

## 2024-03-17 DIAGNOSIS — N179 Acute kidney failure, unspecified: Secondary | ICD-10-CM

## 2024-03-17 DIAGNOSIS — J90 Pleural effusion, not elsewhere classified: Secondary | ICD-10-CM

## 2024-03-17 DIAGNOSIS — E871 Hypo-osmolality and hyponatremia: Secondary | ICD-10-CM | POA: Diagnosis not present

## 2024-03-17 LAB — BASIC METABOLIC PANEL WITH GFR
Anion gap: 9 (ref 5–15)
BUN: 18 mg/dL (ref 8–23)
CO2: 27 mmol/L (ref 22–32)
Calcium: 8.7 mg/dL — ABNORMAL LOW (ref 8.9–10.3)
Chloride: 85 mmol/L — ABNORMAL LOW (ref 98–111)
Creatinine, Ser: 1 mg/dL (ref 0.44–1.00)
GFR, Estimated: >60 mL/min (ref >60)
Glucose, Bld: 110 mg/dL — ABNORMAL HIGH (ref 70–99)
Potassium: 3.9 mmol/L (ref 3.5–5.1)
Sodium: 121 mmol/L — ABNORMAL LOW (ref 135–145)

## 2024-03-17 LAB — CBC WITH DIFFERENTIAL/PLATELET
Abs Immature Granulocytes: 0.05 K/uL (ref 0.00–0.07)
Basophils Absolute: 0.1 K/uL (ref 0.0–0.1)
Basophils Relative: 1 %
Eosinophils Absolute: 0.2 K/uL (ref 0.0–0.5)
Eosinophils Relative: 2 %
HCT: 33.8 % — ABNORMAL LOW (ref 36.0–46.0)
Hemoglobin: 12.1 g/dL (ref 12.0–15.0)
Immature Granulocytes: 1 %
Lymphocytes Relative: 11 %
Lymphs Abs: 1.2 K/uL (ref 0.7–4.0)
MCH: 31.1 pg (ref 26.0–34.0)
MCHC: 35.8 g/dL (ref 30.0–36.0)
MCV: 86.9 fL (ref 80.0–100.0)
Monocytes Absolute: 1.5 K/uL — ABNORMAL HIGH (ref 0.1–1.0)
Monocytes Relative: 14 %
Neutro Abs: 7.7 K/uL (ref 1.7–7.7)
Neutrophils Relative %: 71 %
Platelets: 253 K/uL (ref 150–400)
RBC: 3.89 MIL/uL (ref 3.87–5.11)
RDW: 13.2 % (ref 11.5–15.5)
WBC: 10.6 K/uL — ABNORMAL HIGH (ref 4.0–10.5)
nRBC: 0 % (ref 0.0–0.2)

## 2024-03-17 LAB — BODY FLUID CELL COUNT WITH DIFFERENTIAL
Eos, Fluid: 4 %
Lymphs, Fluid: 68 %
Monocyte-Macrophage-Serous Fluid: 21 % — ABNORMAL LOW (ref 50–90)
Neutrophil Count, Fluid: 3 % (ref 0–25)
Other Cells, Fluid: 4 %
Total Nucleated Cell Count, Fluid: 505 uL (ref 0–1000)

## 2024-03-17 LAB — MAGNESIUM: Magnesium: 1.7 mg/dL (ref 1.7–2.4)

## 2024-03-17 LAB — HEPARIN LEVEL (UNFRACTIONATED): Heparin Unfractionated: >1.1 [IU]/mL — ABNORMAL HIGH (ref 0.30–0.70)

## 2024-03-17 LAB — APTT: aPTT: 41 s — ABNORMAL HIGH (ref 24–36)

## 2024-03-17 MED ORDER — FUROSEMIDE 10 MG/ML IJ SOLN
40.0000 mg | Freq: Every day | INTRAMUSCULAR | Status: DC
  Administered 2024-03-17 – 2024-03-18 (×2): 40 mg via INTRAVENOUS
  Filled 2024-03-17 (×2): qty 4

## 2024-03-17 MED ORDER — AMIODARONE LOAD VIA INFUSION
150.0000 mg | Freq: Once | INTRAVENOUS | Status: AC
  Administered 2024-03-17: 150 mg via INTRAVENOUS
  Filled 2024-03-17: qty 83.34

## 2024-03-17 MED ORDER — DIPHENHYDRAMINE HCL 50 MG/ML IJ SOLN
50.0000 mg | Freq: Once | INTRAMUSCULAR | Status: AC
  Administered 2024-03-18: 50 mg via INTRAVENOUS
  Filled 2024-03-17: qty 1

## 2024-03-17 MED ORDER — MAGNESIUM SULFATE 2 GM/50ML IV SOLN
2.0000 g | Freq: Once | INTRAVENOUS | Status: AC
  Administered 2024-03-17: 2 g via INTRAVENOUS
  Filled 2024-03-17: qty 50

## 2024-03-17 MED ORDER — HEPARIN (PORCINE) 25000 UT/250ML-% IV SOLN
1250.0000 [IU]/h | INTRAVENOUS | Status: DC
  Administered 2024-03-17: 1050 [IU]/h via INTRAVENOUS
  Filled 2024-03-17: qty 250

## 2024-03-17 MED ORDER — LOSARTAN POTASSIUM 25 MG PO TABS: 25.0000 mg | ORAL_TABLET | Freq: Every day | ORAL | Status: DC

## 2024-03-17 MED ORDER — AMIODARONE HCL IN DEXTROSE 360-4.14 MG/200ML-% IV SOLN
30.0000 mg/h | INTRAVENOUS | Status: DC
  Administered 2024-03-17 – 2024-03-18 (×4): 30 mg/h via INTRAVENOUS
  Filled 2024-03-17 (×3): qty 200

## 2024-03-17 MED ORDER — ENSURE PLUS HIGH PROTEIN PO LIQD
237.0000 mL | Freq: Two times a day (BID) | ORAL | Status: DC
  Administered 2024-03-17 – 2024-03-21 (×7): 237 mL via ORAL

## 2024-03-17 MED ORDER — PREDNISONE 20 MG PO TABS
50.0000 mg | ORAL_TABLET | Freq: Four times a day (QID) | ORAL | Status: AC
  Administered 2024-03-17 – 2024-03-18 (×3): 50 mg via ORAL
  Filled 2024-03-17 (×3): qty 1

## 2024-03-17 MED ORDER — ASPIRIN 81 MG PO CHEW
81.0000 mg | CHEWABLE_TABLET | ORAL | Status: AC
  Administered 2024-03-18: 81 mg via ORAL
  Filled 2024-03-17: qty 1

## 2024-03-17 MED ORDER — DIPHENHYDRAMINE HCL 25 MG PO CAPS: 50.0000 mg | ORAL_CAPSULE | Freq: Once | ORAL | Status: AC

## 2024-03-17 MED ORDER — AMIODARONE HCL IN DEXTROSE 360-4.14 MG/200ML-% IV SOLN
60.0000 mg/h | INTRAVENOUS | Status: AC
  Administered 2024-03-17 (×2): 60 mg/h via INTRAVENOUS
  Filled 2024-03-17 (×2): qty 200

## 2024-03-17 MED ORDER — FREE WATER
250.0000 mL | Freq: Once | Status: AC
  Administered 2024-03-18: 250 mL via ORAL

## 2024-03-17 NOTE — Progress Notes (Signed)
 Initial Nutrition Assessment  DOCUMENTATION CODES:  Not applicable  INTERVENTION:  Liberalize diet to regular with 1.5L fluid restriction to promote PO intake and increase sodium levels Encourage PO intake Ensure Plus High Protein po BID, each supplement provides 350 kcal and 20 grams of protein Magic cup TID with meals, each supplement provides 290 kcal and 9 grams of protein  NUTRITION DIAGNOSIS:  Inadequate oral intake related to poor appetite as evidenced by per patient/family report.  GOAL:  Patient will meet greater than or equal to 90% of their needs  MONITOR:  PO intake, Labs, Supplement acceptance  REASON FOR ASSESSMENT:  Consult Assessment of nutrition requirement/status, Poor PO  ASSESSMENT:  Pt with hx of CHF with AV block s/p PPM, HLD, HTN, hypothyroidism, GERD, PAD, and a-fib with recent cardioversion presented to ED with SOB and chest pain ongoing for several days.  12/2 - presented to ED, thoracentesis with removal of 1.3L of fluid  Pt resting in bed at the time of assessment, husband present at bedside. Pt reports that her appetite has been poor for several weeks. Also thinks she has lost some weight. Husband and pt both endorse poor energy levels over the last 3 months.   Pt reports that she has been doing ok with liquids but that whenever she goes to eat food the smell starts to make her feel nauseated. States that today she ate a few bites of a pancake and that her husband got her chicken nuggets from [employer] and she ate a few of them. Otherwise has not had anything else to eat. Will liberalize diet and add nutrition supplements as pt is agreeable to trying them. Will follow-up on intake at next assessment.   Pt scheduled to diagnostic undergo heart cath tomorrow.  Admit weight: 82.4 kg   Current weight: 84.2 kg   Intake/Output Summary (Last 24 hours) at 03/17/2024 1607 Last data filed at 03/17/2024 1507 Gross per 24 hour  Intake 1161.18 ml  Output 150  ml  Net 1011.18 ml  Net IO Since Admission: 767.18 mL [03/17/24 1607]  Average Meal Intake: 12/3-12/4: 57% intake x 3 recorded meals  Nutritionally Relevant Medications: Scheduled Meds:  diphenhydrAMINE   50 mg Intravenous Once   Ensure Plus High Protein  237 mL Oral BID BM   [START ON 03/18/2024] free water   250 mL Oral Once   furosemide   40 mg Intravenous Daily   levothyroxine   112 mcg Oral QHS   pravastatin   20 mg Oral q1800   predniSONE   50 mg Oral Q6H   spironolactone   12.5 mg Oral Daily   PRN Meds: docusate sodium , polyethylene glycol  Labs Reviewed: Sodium 121, chloride 85  NUTRITION - FOCUSED PHYSICAL EXAM: Flowsheet Row Most Recent Value  Orbital Region No depletion  Upper Arm Region No depletion  Thoracic and Lumbar Region No depletion  Buccal Region No depletion  Temple Region No depletion  Clavicle Bone Region No depletion  Clavicle and Acromion Bone Region No depletion  Scapular Bone Region No depletion  Dorsal Hand No depletion  Patellar Region Mild depletion  Anterior Thigh Region Mild depletion  Posterior Calf Region Mild depletion  Edema (RD Assessment) Mild  Hair Reviewed  Eyes Reviewed  Mouth Reviewed  Skin Reviewed  Nails Reviewed    Diet Order:   Diet Order             Diet NPO time specified Except for: Sips with Meds  Diet effective 0500  Diet clear liquid Room service appropriate? Yes; Fluid consistency: Thin  Diet effective midnight           Diet regular Room service appropriate? Yes; Fluid consistency: Thin; Fluid restriction: 1500 mL Fluid  Diet effective now                   EDUCATION NEEDS:  Education needs have been addressed  Skin:  Skin Assessment: Reviewed RN Assessment  Last BM:  12/4 - type 6  Height:  Ht Readings from Last 1 Encounters:  03/01/24 5' 4 (1.626 m)    Weight:  Wt Readings from Last 1 Encounters:  03/17/24 84.2 kg    Ideal Body Weight:  54.5 kg  BMI:  Body mass index is 31.86  kg/m.  Estimated Nutritional Needs:  Kcal:  1600-1800 kcal/d Protein:  80-95g/d Fluid:  1.5-1.8L/d    Vernell Lukes, RD, LDN, CNSC Registered Dietitian II Please reach out via secure chat

## 2024-03-17 NOTE — Assessment & Plan Note (Signed)
-   per EP: Amiodarone 400mg  BID for 1 week Then > 400mg  daily for 1 week Then > 200mg  diaily EP follow up will be arranged for ~ 3 weeks to reassess rhythm plan for DCCV if needed ~ Jan-Feb with Dr. Kennyth to look towards timing of ablation

## 2024-03-17 NOTE — Progress Notes (Signed)
 PHARMACY - ANTICOAGULATION CONSULT NOTE  Pharmacy Consult:  Heparin  Indication: atrial fibrillation  Allergies  Allergen Reactions   Marshmallow [Althaea Officinalis]     Rash, hives, itching, SOB  Pt states this is no longer an issue for her   Other     Jello - Rash, hives, itching, SOB  Pt states is no longer an issue for her   Contrast Media [Iodinated Contrast Media]     unknown   Latex Rash   Nickel Rash    Patient Measurements: Weight: 84.2 kg (185 lb 10 oz) Heparin  dosing weight = 73 kg  Vital Signs: Temp: 98 F (36.7 C) (12/04 1900) Temp Source: Oral (12/04 1900) BP: 104/63 (12/04 1900) Pulse Rate: 75 (12/04 1900)  Labs: Recent Labs    03/15/24 0836 03/15/24 1022 03/15/24 1204 03/16/24 0250 03/16/24 1120 03/17/24 0454 03/17/24 1809  HGB 13.7  --   --  12.4  --  12.1  --   HCT 38.7  --   --  34.5*  --  33.8*  --   PLT 303  --   --  258  --  253  --   APTT  --   --   --   --   --   --  41*  CREATININE 1.25*  --    < > 1.10* 1.17* 1.00  --   TROPONINIHS 24* 21*  --   --   --   --   --    < > = values in this interval not displayed.    Estimated Creatinine Clearance: 58.1 mL/min (by C-G formula based on SCr of 1 mg/dL).   Medical History: Past Medical History:  Diagnosis Date   AICD (automatic cardioverter/defibrillator) present    St. Jude   Anxiety    takes Klonopin  daily as needed   Arthritis    Back pain    reason unknown   Chronic systolic CHF (congestive heart failure) (HCC)    a. Echo 10/2013: EF 40-45 (apical images 35-40%); b. Echo 12/2014: EF 35-40%; c. 06/2020 Echo: EF 25-30%, glob HK, Gr2 DD, RVSP ~ ; d. 10/2020 Echo: EF 25-30%, glob HK. Nl RV fxn, nl PASP, mildly dil LA. Small peric eff, triv MR.   Complete AV block (HCC)    a. s/p pacemaker insertion 1996; b. s/p contralateral side St. Jude generator change 11/2014.   Dizziness    occasionally   GERD (gastroesophageal reflux disease)    takes Zantac  daily as needed   Gout     Headache    occasionally   History of blood clots    in heart-was on Coumadin---this many yrs ago   History of colon polyps    benign   Hyperlipidemia    takes Lovastatin  daily   Hypertension    hx of-since hip replacement MD took resident off meds 10/30/14 b/c well controlled   Hypothyroid    takes Synthroid  daily   Joint pain    Joint swelling    Moderate Pericardial effusion    a. 07/2020 Echo: Mod effusion w/o tamponade; b. 10/2020 Echo: small peric eff w/o tamponade.   NICM (nonischemic cardiomyopathy) (HCC)    a. tachycardia induced->EF 40-45% (2010), 35-40% (10/2013), 35-40% Gr1 DD, mod TR (12/2014);  b. 01/2015 Cath: Nl cors, EF 30-35%, glob HK; c. 06/2020 Echo: EF 25-30%; d. 10/2020 Echo: EF 25-30%.   Numbness and tingling    left leg and right arm   Ovarian cancer (HCC)  ovarian, skin    Pacemaker lead failure--noise on atrial greater than ventricular lead with ventricular backup pulse pacing 10/17/2014   Peripheral artery disease    stent on lt illiac     Assessment: 88 YOF presented with SOB and chest discomfort.  Patient was continued on PTA Eliquis  for history of Afib.  Now requiring cath, so Pharmacy consulted to transition patient from Eliquis  to IV heparin .  Last Eliquis  dose on 12/3 at 2116l; will use aPTT to guide heparin  dosing until heparin  level and APTT correlate.  CBC stable; no bleeding reported.  PM: aPTT 41 is subtherapeutic on 1050 units/hr. No issues with the heparin  infusion or bleeding reported per RN.  Goal of Therapy:  Heparin  level 0.3-0.7 units/ml aPTT 66-102 seconds Monitor platelets by anticoagulation protocol: Yes   Plan:  Increase heparin  infusion to 1250 units/hr Check 8 hr aPTT and heparin  level Daily heparin  level, aPTT and CBC  Rocky Slade, PharmD, BCPS 03/17/2024, 8:12 PM  Please check AMION for all [facility] Pharmacy phone numbers After 10:00 PM, call Main Pharmacy [phone]

## 2024-03-17 NOTE — Assessment & Plan Note (Addendum)
-   Sodium 119 on admission.  Previously normal in November - etiology on admission was due to CHF - for now is being continued on lasix  and aldactone , esp given echo findings - continue trending Na; she is asymptomatic  - Na 131, stable this am

## 2024-03-17 NOTE — Hospital Course (Addendum)
 [name] [name] is a 68 yo female with PMH afib, CHB s/p PPM, HTN, HLD, sCHF, anxiety, GERD, gout, hypothyroid, PAD s/p iliac stent who initially presented on 12/2 with SOB lasting about 4 to 5 days with progressive worsening. There was report of med compliance but increase in water  intake prior to admission. On workup she was found to have hyponatremia, 119.  Initially admitted to the ICU. Cardiology and EP were consulted as well. She was started on lasix  and underwent further workup.  Echo 03/16/2024: EF 20 to 25%, global hypokinesis, severe LVH, grade 3 DD.  Apical akinesis and swirling of contrast concerning for early thrombus formation.  s/p PPM historically and recent left anterolateral thoracotomy for insertion of 2 left ventricular epicardial pacing leads and revision of BiV pacing system on 02/05/2024 s/p R/L heart cath on 12/5; no significant disease; elevated filling pressures including RA, severe biventricular failure, moderate pulmonary hypertension. Cardiology managing-could not tolerate Entresto , intolerant to SGLT 2 I.  Continue carvedilol , Aldactone . At this time volume status improved sodium is stabilized, plans for discharge home on diuretics Aldactone  Coreg  amiodarone  and outpatient BMP check in 1 week, patient is doing well and eager to go home this morning  Subjective: Seen and examined today Feels well on RA Overnight afebrile, VSS, Labs stable electrolytes Eager ot go home this am Cardio called and cleared for dc  Discharge Diagnoses:   Hyponatremia syndrome Sodium 119 on admission.  Previously normal in November. Etiology on admission was due to CHF for now is being continued on lasix  and aldactone , esp given echo findings Na 131 remained stable tolerating diuretics   Acute on chronic combined systolic and diastolic CHF: Echo 03/16/2024: EF 20 to 25%, global hypokinesis, severe LVH, grade 3 DD.  Apical akinesis and swirling of contrast concerning for early thrombus  formation.  s/p PPM historically and recent left anterolateral thoracotomy for insertion of 2 left ventricular epicardial pacing leads and revision of BiV pacing system on 02/05/2024 s/p R/L heart cath on 12/5; no significant disease; elevated filling pressures including RA, severe biventricular failure, moderate pulmonary hypertension. Cardiology managing-could not tolerate Entresto , intolerant to SGLT 2 I.  Continue carvedilol , Aldactone . Medication Recommendations: Eliquis  5 mg twice daily, amiodarone  200 mg twice daily x 1 week then decrease to 200 mg daily, Lasix  40 mg daily, spironolactone  25 mg daily, carvedilol  6.25 mg twice daily Other recommendations (labs, testing, etc): BMET in 1 week Follow up as an outpatient: Has follow-up appointment scheduled on 12/16.  Scheduled with EP on 12/30    Recurrent left pleural effusion left effusion; appears started after thoracotomy on 02/05/2024 but has improved some with diuresis since then however recurring. Underwent left thoracentesis in the ICU on 03/15/2024 with 1.3 L fluid drained exudative by protein; transudative by LDH. given unilateral, differential includes infectious vs CHF vs ?complication from thoracotomy since new onset after procedure (had chest tube 10/24>>10/26) Continue diuretics as above.  Culture no growth add on to prior thoracentesis fluid: pH (in process), cell count (505 total nuc cells), and culture (no organisms). She has fu with pulmonology   Persistent atrial fibrillation Atrial flutter Transitioned to oral amiodarone  on 12/6 per cardiology Heparin  drip being converted back to Eliquis  on 03/18/2024 given no further procedures Due to development of hematuria on 12/7, Eliquis  held No further hematuria and workup negative, Eliquis  resumed evening of 03/20/2024 continue coreg .  Likely needs cardioversion as outpatient per EP:EP follow up will be arranged for ~ 3 weeks to reassess rhythm plan  for DCCV if needed ~ Jan-Feb with  Dr. Kennyth to look towards timing of ablation   Hematuria No prior similar history -Developed overnight of 12/6 into 12/7; does feel a little bit of burning with urination -UA negative for infection -Eliquis  held -last dose evening of 03/19/2024.Discussed with urology as well.  CT A/P requested and negative for evidence of hematuria etiology.Reviewed CT with Dr. Nieves; hematuria felt to be clinically insignificant and she is recommended to follow-up outpatient for cystoscopy.  Patient understands plan and findings of CT reviewed as well; information added to AVS Eliquis  resumed evening of 03/20/2024.  No further hematuria   AKI- resolved as of 03/20/2024 baseline creatinine ~ 0.8 - 0.9 patient presents with increase in creat >0.3 mg/dL above baseline or creat increase >1.5x baseline presumed to have occurred within past 7 days PTA.  presumed CRS. stable and improved on diuresis for now  Hypothyroidism Continue Synthroid    Heart block AV complete  see CHF; now has PPM and ICD   Pure hypercholesterolemia continue pravastatin    Primary hypertension soft pressures.  Tolerating GDMT as above   History of allergy to radiographic contrast media-resolved as of 03/21/2024 very questionable contrast allergy noted 02/2011; no documentation of reaction and patient has no recollection of anything either; also had CTA chest PE workup in 2016 - she is okay with trial of contrast for CT and forgoing any pre-meds - She successfully tolerated CT A/P with contrast on 03/20/2024 -Contrast allergy deleted as ambiguous entry of unclear etiology and has tolerated 2 CAT scans since allergy posted; patient in agreement with deletion as well   Class I Obesity w/ Body mass index is 30.35 kg/m.: Will benefit with PCP follow-up, weight loss,healthy lifestyle and outpatient sleep eval if not done.    DVT prophylaxis: Place and maintain sequential compression device Start: 03/20/24 0728 SCD's Start: 03/18/24  0902 Eliquis  Code Status:   Code Status: Full Code Family Communication: plan of care discussed with patient at bedside. Patient status is: Remains hospitalized because of severity of illness Level of care: Telemetry   Dispo: The patient is from: home            Anticipated disposition: home Objective: Vitals last 24 hrs: Vitals:   03/21/24 1632 03/21/24 1935 03/21/24 2334 03/22/24 0551  BP: 111/73 126/75 (!) 114/56   Pulse: 70  69   Resp: 18 20 19    Temp: 97.9 F (36.6 C) 97.8 F (36.6 C) 97.9 F (36.6 C)   TempSrc: Oral Oral Oral   SpO2: 96% 98%    Weight:    80.2 kg  Height:        Physical Examination: General exam: alert awake, oriented, older than stated age HEENT:Oral mucosa moist, Ear/Nose WNL grossly Respiratory system: Bilaterally clear BS,no use of accessory muscle Cardiovascular system: S1 & S2 +, No JVD. Gastrointestinal system: Abdomen soft,NT,ND, BS+ Nervous System: Alert, awake, moving all extremities,and following commands. Extremities: extremities warm, leg edema neg Skin: Warm, no rashes MSK: Normal muscle bulk,tone, power   Medications reviewed:  Scheduled Meds:  amiodarone   200 mg Oral BID   apixaban   5 mg Oral BID   carvedilol   6.25 mg Oral BID WC   Chlorhexidine  Gluconate Cloth  6 each Topical Daily   feeding supplement  237 mL Oral BID BM   furosemide   40 mg Oral Daily   insulin  aspart  0-6 Units Subcutaneous TID AC & HS   levothyroxine   112 mcg Oral QHS   pravastatin   20 mg Oral q1800   sodium chloride  flush  3 mL Intravenous Q12H   spironolactone   25 mg Oral Daily   Continuous Infusions: Diet: Diet Order             Diet Heart Room service appropriate? Yes; Fluid consistency: Thin  Diet effective now

## 2024-03-17 NOTE — Progress Notes (Signed)
 Progress Note    [name] [name]   FMW:[id number]  DOB: 07/07/1955  DOA: 03/15/2024     2 PCP: [name] Bail, MD  Initial CC: SOB  Hospital Course: [name] [name] is a 68 yo female with PMH afib, CHB s/p PPM, HTN, HLD, sCHF, anxiety, GERD, gout, hypothyroid, PAD s/p iliac stent who initially presented on 12/2 with SOB lasting about 4 to 5 days with progressive worsening. There was report of med compliance but increase in water  intake prior to admission.  On workup she was found to have hyponatremia, 119.  Initially admitted to the ICU. Cardiology and EP were consulted as well. She was started on lasix  and underwent further workup.   Interval History:  Resting in bed comfortable this morning but anxious regarding workup and treatment.  Tried providing empathetic listening and reassurance. Denies any chest pain or significant shortness of breath.  Ambulating to the bathroom this morning without significant dyspnea.  Assessment and Plan: * Hyponatremia - Sodium 119 on admission.  Previously normal in November - etiology on admission was due to CHF - for now is being continued on lasix  and aldactone , esp given echo findings - continue trending Na; she is asymptomatic   Acute on chronic combined systolic and diastolic CHF (congestive heart failure) (HCC) - Echo performed 03/16/2024: EF 20 to 25%, global hypokinesis, severe LVH, grade 3 DD.  Apical akinesis and swirling of contrast concerning for early thrombus formation - s/p PPM historically and recent left anterolateral thoracotomy for insertion of 2 left ventricular epicardial pacing leads and revision of BiV pacing system on 02/05/2024 - Cardiology following closely - Remains on diuresis with Lasix  - on aldactone  as well  - Tentative plan for right and left heart cath on 03/18/2024  Pleural effusion on left - left effusion; appears started after thoracotomy on 02/05/2024 but has improved some with diuresis since then however  recurring -Underwent left thoracentesis in the ICU on 03/15/2024 with 1.3 L fluid drained - exudative by protein; transudative by LDH - ongoing diuresis being attempted for treatment - given unilateral, differential includes infectious vs CHF vs ?complication from thoracotomy since new onset after procedure (had chest tube 10/24>>10/26) - add on to prior thoracentesis fluid: pH, cell count, and culture  Persistent atrial fibrillation (HCC) - remains on amio drip - continue heparin  drip - continue coreg   Atrial flutter (HCC) - per EP: Amiodarone 400mg  BID for 1 week Then > 400mg  daily for 1 week Then > 200mg  diaily EP follow up will be arranged for ~ 3 weeks to reassess rhythm plan for DCCV if needed ~ Jan-Feb with [name] [name] to look towards timing of ablation   AKI (acute kidney injury) - baseline creatinine ~ 0.8 - 0.9 - patient presents with increase in creat >0.3 mg/dL above baseline or creat increase >1.5x baseline presumed to have occurred within past 7 days PTA - creat 1.25 on admission - presumed CRS - stable and improved on diuresis for now  Hypothyroidism - Continue Synthroid   Complete AV block (HCC) - see CHF; now has PPM and ICD  Hypercholesteremia - Continue pravastatin   Essential hypertension - soft pressures - okay for coreg  for now; will reduce or hold if necessary    Antimicrobials:   DVT prophylaxis:   Heparin  drip  Code Status:   Code Status: Full Code  Mobility Assessment (Last 72 Hours)     Mobility Assessment     Row Name 03/17/24 0830 03/16/24 2000 03/16/24 0800 03/15/24 1945 03/15/24 1400  Does the patient have exclusion criteria? No- Perform mobility assessment No- Perform mobility assessment No- Perform mobility assessment No- Perform mobility assessment No- Perform mobility assessment   What is the highest level of mobility based on the mobility assessment? Level 5 (Ambulates independently) - Balance while walking independently -  Complete Level 5 (Ambulates independently) - Balance while walking independently - Complete Level 5 (Ambulates independently) - Balance while walking independently - Complete Level 4 (Ambulates with assistance) - Balance while stepping forward/back - Complete Level 5 (Ambulates independently) - Balance while walking independently - Complete      Diet: Diet Orders (From admission, onward)     Start     Ordered   03/18/24 0500  Diet NPO time specified Except for: Sips with Meds  (Inpatient Diet Options)  Diet effective 0500       Question:  Except for  Answer:  Sips with Meds   03/17/24 1517   03/18/24 0001  Diet clear liquid Room service appropriate? Yes; Fluid consistency: Thin  (Inpatient Diet Options)  Diet effective midnight       Question Answer Comment  Room service appropriate? Yes   Fluid consistency: Thin      03/17/24 1517   03/17/24 1245  Diet regular Room service appropriate? Yes; Fluid consistency: Thin; Fluid restriction: 1500 mL Fluid  Diet effective now       Question Answer Comment  Room service appropriate? Yes   Fluid consistency: Thin   Fluid restriction: 1500 mL Fluid      03/17/24 1245            Barriers to discharge: None Disposition Plan: TBD HH orders placed: TBD Status is: Inpatient  Objective: Blood pressure 107/75, pulse 76, temperature 98.3 F (36.8 C), temperature source Oral, resp. rate (!) 22, weight 84.2 kg, SpO2 93%.  Examination:  Physical Exam Constitutional:      Appearance: Normal appearance.  HENT:     Head: Normocephalic and atraumatic.     Mouth/Throat:     Mouth: Mucous membranes are moist.  Eyes:     Extraocular Movements: Extraocular movements intact.  Cardiovascular:     Rate and Rhythm: Normal rate and regular rhythm.  Pulmonary:     Effort: Pulmonary effort is normal. No respiratory distress.     Breath sounds: Examination of the left-middle field reveals decreased breath sounds. Examination of the left-lower field  reveals decreased breath sounds. Decreased breath sounds present. No wheezing.  Abdominal:     General: Bowel sounds are normal. There is no distension.     Palpations: Abdomen is soft.     Tenderness: There is no abdominal tenderness.  Musculoskeletal:        General: Normal range of motion.     Cervical back: Normal range of motion and neck supple.     Right lower leg: No edema.     Left lower leg: No edema.  Skin:    General: Skin is warm and dry.  Neurological:     General: No focal deficit present.     Mental Status: She is alert.  Psychiatric:        Mood and Affect: Mood normal.      Consultants:  Cardiology EP Pulmonology  Procedures:  03/15/2024: Left thoracentesis  Data Reviewed: Results for orders placed or performed during the hospital encounter of 03/15/24 (from the past 24 hours)  CBC with Differential/Platelet     Status: Abnormal   Collection Time: 03/17/24  4:54 AM  Result Value Ref Range   WBC 10.6 (H) 4.0 - 10.5 K/uL   RBC 3.89 3.87 - 5.11 MIL/uL   Hemoglobin 12.1 12.0 - 15.0 g/dL   HCT 66.1 (L) 63.9 - 53.9 %   MCV 86.9 80.0 - 100.0 fL   MCH 31.1 26.0 - 34.0 pg   MCHC 35.8 30.0 - 36.0 g/dL   RDW 86.7 88.4 - 84.4 %   Platelets 253 150 - 400 K/uL   nRBC 0.0 0.0 - 0.2 %   Neutrophils Relative % 71 %   Neutro Abs 7.7 1.7 - 7.7 K/uL   Lymphocytes Relative 11 %   Lymphs Abs 1.2 0.7 - 4.0 K/uL   Monocytes Relative 14 %   Monocytes Absolute 1.5 (H) 0.1 - 1.0 K/uL   Eosinophils Relative 2 %   Eosinophils Absolute 0.2 0.0 - 0.5 K/uL   Basophils Relative 1 %   Basophils Absolute 0.1 0.0 - 0.1 K/uL   Immature Granulocytes 1 %   Abs Immature Granulocytes 0.05 0.00 - 0.07 K/uL  Basic metabolic panel     Status: Abnormal   Collection Time: 03/17/24  4:54 AM  Result Value Ref Range   Sodium 121 (L) 135 - 145 mmol/L   Potassium 3.9 3.5 - 5.1 mmol/L   Chloride 85 (L) 98 - 111 mmol/L   CO2 27 22 - 32 mmol/L   Glucose, Bld 110 (H) 70 - 99 mg/dL   BUN 18 8  - 23 mg/dL   Creatinine, Ser 8.99 0.44 - 1.00 mg/dL   Calcium  8.7 (L) 8.9 - 10.3 mg/dL   GFR, Estimated >39 >39 mL/min   Anion gap 9 5 - 15  Magnesium      Status: None   Collection Time: 03/17/24  4:54 AM  Result Value Ref Range   Magnesium  1.7 1.7 - 2.4 mg/dL    I have reviewed pertinent nursing notes, vitals, labs, and images as necessary. I have ordered labwork to follow up on as indicated.  I have reviewed the last notes from staff over past 24 hours. I have discussed patient's care plan and test results with nursing staff, CM/SW, and other staff as appropriate.  Old records reviewed in assessment of this patient  Time spent: Greater than 50% of the 55 minute visit was spent in counseling/coordination of care for the patient as laid out in the A&P.   LOS: 2 days   Alm Apo, MD [facility] 03/17/2024, 4:34 PM

## 2024-03-17 NOTE — Assessment & Plan Note (Signed)
-   soft pressures - okay for coreg  for now; will reduce or hold if necessary

## 2024-03-17 NOTE — Assessment & Plan Note (Signed)
 Continue Synthroid 

## 2024-03-17 NOTE — Assessment & Plan Note (Signed)
-   see CHF; now has PPM and ICD

## 2024-03-17 NOTE — Assessment & Plan Note (Signed)
-   Transitioned to oral amiodarone  on 12/6 per cardiology - Heparin  drip being converted back to Eliquis  on 03/18/2024 given no further procedures -Due to development of hematuria on 12/7, Eliquis  held - continue coreg

## 2024-03-17 NOTE — Progress Notes (Signed)
 PHARMACY - ANTICOAGULATION CONSULT NOTE  Pharmacy Consult:  Heparin  Indication: atrial fibrillation  Allergies  Allergen Reactions   Marshmallow [Althaea Officinalis]     Rash, hives, itching, SOB  Pt states this is no longer an issue for her   Other     Jello - Rash, hives, itching, SOB  Pt states is no longer an issue for her   Contrast Media [Iodinated Contrast Media]     unknown   Latex Rash   Nickel Rash    Patient Measurements: Weight: 84.2 kg (185 lb 10 oz) Heparin  dosing weight = 73 kg  Vital Signs: Temp: 98.5 F (36.9 C) (12/04 0743) Temp Source: Axillary (12/04 0743) BP: 116/69 (12/04 0930) Pulse Rate: 70 (12/04 0930)  Labs: Recent Labs    03/15/24 0836 03/15/24 1022 03/15/24 1204 03/16/24 0250 03/16/24 1120 03/17/24 0454  HGB 13.7  --   --  12.4  --  12.1  HCT 38.7  --   --  34.5*  --  33.8*  PLT 303  --   --  258  --  253  CREATININE 1.25*  --    < > 1.10* 1.17* 1.00  TROPONINIHS 24* 21*  --   --   --   --    < > = values in this interval not displayed.    Estimated Creatinine Clearance: 58.1 mL/min (by C-G formula based on SCr of 1 mg/dL).   Medical History: Past Medical History:  Diagnosis Date   AICD (automatic cardioverter/defibrillator) present    St. Jude   Anxiety    takes Klonopin  daily as needed   Arthritis    Back pain    reason unknown   Chronic systolic CHF (congestive heart failure) (HCC)    a. Echo 10/2013: EF 40-45 (apical images 35-40%); b. Echo 12/2014: EF 35-40%; c. 06/2020 Echo: EF 25-30%, glob HK, Gr2 DD, RVSP ~ ; d. 10/2020 Echo: EF 25-30%, glob HK. Nl RV fxn, nl PASP, mildly dil LA. Small peric eff, triv MR.   Complete AV block (HCC)    a. s/p pacemaker insertion 1996; b. s/p contralateral side St. Jude generator change 11/2014.   Dizziness    occasionally   GERD (gastroesophageal reflux disease)    takes Zantac  daily as needed   Gout    Headache    occasionally   History of blood clots    in heart-was on  Coumadin---this many yrs ago   History of colon polyps    benign   Hyperlipidemia    takes Lovastatin  daily   Hypertension    hx of-since hip replacement MD took resident off meds 10/30/14 b/c well controlled   Hypothyroid    takes Synthroid  daily   Joint pain    Joint swelling    Moderate Pericardial effusion    a. 07/2020 Echo: Mod effusion w/o tamponade; b. 10/2020 Echo: small peric eff w/o tamponade.   NICM (nonischemic cardiomyopathy) (HCC)    a. tachycardia induced->EF 40-45% (2010), 35-40% (10/2013), 35-40% Gr1 DD, mod TR (12/2014);  b. 01/2015 Cath: Nl cors, EF 30-35%, glob HK; c. 06/2020 Echo: EF 25-30%; d. 10/2020 Echo: EF 25-30%.   Numbness and tingling    left leg and right arm   Ovarian cancer (HCC)    ovarian, skin    Pacemaker lead failure--noise on atrial greater than ventricular lead with ventricular backup pulse pacing 10/17/2014   Peripheral artery disease    stent on lt illiac     Assessment: 68  YOF presented with SOB and chest discomfort.  Patient was continued on PTA Eliquis  for history of Afib.  Now requiring cath, so Pharmacy consulted to transition patient from Eliquis  to IV heparin .  Last Eliquis  dose on 12/3 at 2116l; will use aPTT to guide heparin  dosing until heparin  level and APTT correlate.  CBC stable; no bleeding reported.  Goal of Therapy:  Heparin  level 0.3-0.7 units/ml aPTT 66-102 seconds Monitor platelets by anticoagulation protocol: Yes   Plan:  Start IV heparin  at 1050 units/hr Check 6 hr aPTT and heparin  level Daily heparin  level, aPTT and CBC  Shannan Garfinkel D. [place], PharmD, BCPS, BCCCP 03/17/2024, 9:49 AM

## 2024-03-17 NOTE — Assessment & Plan Note (Addendum)
-   Echo performed 03/16/2024: EF 20 to 25%, global hypokinesis, severe LVH, grade 3 DD.  Apical akinesis and swirling of contrast concerning for early thrombus formation - s/p PPM historically and recent left anterolateral thoracotomy for insertion of 2 left ventricular epicardial pacing leads and revision of BiV pacing system on 02/05/2024 - Cardiology following closely - Remains on diuresis with Lasix  - on aldactone  as well  - s/p R/L heart cath on 12/5; no significant disease; elevated filling pressures including RA, severe biventricular failure, moderate pulmonary hypertension -ACHF to see 12/7

## 2024-03-17 NOTE — Assessment & Plan Note (Signed)
-   baseline creatinine ~ 0.8 - 0.9 - patient presents with increase in creat >0.3 mg/dL above baseline or creat increase >1.5x baseline presumed to have occurred within past 7 days PTA - creat 1.25 on admission - presumed CRS - stable and improved on diuresis for now

## 2024-03-17 NOTE — Progress Notes (Addendum)
 Rounding Note   Patient Name: [name] [name] Date of Encounter: 03/17/2024  [facility] Cardiologist:   Subjective  Contin to feel improved, less SOB, hoping to go home  Scheduled Meds:  apixaban   5 mg Oral BID   carvedilol   6.25 mg Oral BID WC   Chlorhexidine  Gluconate Cloth  6 each Topical Daily   levothyroxine   112 mcg Oral QHS   pravastatin   20 mg Oral q1800   spironolactone   12.5 mg Oral Daily   Continuous Infusions:  PRN Meds: acetaminophen , clonazePAM , docusate sodium , mouth rinse, oxyCODONE , polyethylene glycol   Vital Signs  Vitals:   03/17/24 0500 03/17/24 0600 03/17/24 0700 03/17/24 0743  BP:  (!) 117/52 112/70   Pulse: 70 70    Resp: 15 20 15    Temp:    98.5 F (36.9 C)  TempSrc:    Axillary  SpO2: 93% 94%    Weight: 84.2 kg       Intake/Output Summary (Last 24 hours) at 03/17/2024 0756 Last data filed at 03/16/2024 1800 Gross per 24 hour  Intake 896 ml  Output 100 ml  Net 796 ml      03/17/2024    5:00 AM 03/16/2024    9:30 AM 03/01/2024    2:49 PM  Last 3 Weights  Weight (lbs) 185 lb 10 oz 181 lb 10.5 oz 194 lb  Weight (kg) 84.2 kg 82.4 kg 87.998 kg      Telemetry  AFlutter/V paced - Personally Reviewed  ECG   No new EKGs - Personally Reviewed  Physical Exam  GEN: No acute distress.   Neck: No JVD Cardiac: RRR, no murmurs, rubs, or gallops.  Respiratory: unchanged/diminished on the left clear otherwise. GI: Soft, nontender, non-distended  MS: No edema; No deformity. Neuro:  Nonfocal  Psych: Normal affect   Labs High Sensitivity Troponin:   Recent Labs  Lab 03/15/24 0836 03/15/24 1022  TROPONINIHS 24* 21*     Chemistry Recent Labs  Lab 03/15/24 1928 03/16/24 0250 03/16/24 1120 03/17/24 0454  NA 120* 124* 122* 121*  K  --  3.9 4.3 3.9  CL  --  85* 84* 85*  CO2  --  26 24 27   GLUCOSE  --  103* 142* 110*  BUN  --  20 21 18   CREATININE  --  1.10* 1.17* 1.00  CALCIUM   --  8.9 8.9 8.7*  MG  --  1.7 1.8 1.7   PROT 6.7  --   --   --   GFRNONAA  --  55* 51* >60  ANIONGAP  --  13 14 9     Lipids No results for input(s): CHOL, TRIG, HDL, LABVLDL, LDLCALC, CHOLHDL in the last 168 hours.  Hematology Recent Labs  Lab 03/15/24 0836 03/16/24 0250 03/17/24 0454  WBC 16.0* 10.7* 10.6*  RBC 4.39 3.98 3.89  HGB 13.7 12.4 12.1  HCT 38.7 34.5* 33.8*  MCV 88.2 86.7 86.9  MCH 31.2 31.2 31.1  MCHC 35.4 35.9 35.8  RDW 13.0 13.1 13.2  PLT 303 258 253   Thyroid   Recent Labs  Lab 03/16/24 0250  TSH 3.121    BNP Recent Labs  Lab 03/15/24 0836 03/16/24 1120  BNP 741.8* 502.6*    DDimer No results for input(s): DDIMER in the last 168 hours.   Radiology    Cardiac Studies   03/16/24: TTE 1. Left ventricular ejection fraction, by estimation, is 20 to 25%. The  left ventricle has severely decreased function. The  left ventricle  demonstrates global hypokinesis. The left ventricular internal cavity size  was severely dilated. There is severe  eccentric left ventricular hypertrophy. Left ventricular diastolic  parameters are consistent with Grade III diastolic dysfunction  (restrictive). There is severe global hypokinesis with apical akinesis and  swerlling of contrast concerning for early  thrombus formation.   2. Right ventricular systolic function is mildly reduced. The right  ventricular size is normal. There is normal pulmonary artery systolic  pressure.   3. Left atrial size was mildly dilated.   4. Lead in RA.   5. The mitral valve is normal in structure. No evidence of mitral valve  regurgitation.   6. At least moderate TR possibly in setting of lead impingement.  Tricuspid valve regurgitation is moderate.   7. The aortic valve is normal in structure. Aortic valve regurgitation is  not visualized.   8. The inferior vena cava is dilated in size with <50% respiratory  variability, suggesting right atrial pressure of 15 mmHg.      10/16/20: TTE 1. Left ventricular  ejection fraction, by estimation, is 25 to 30%. The  left ventricle has severely decreased function. The left ventricle  demonstrates global hypokinesis. The left ventricular internal cavity size  was severely dilated. Left ventricular  diastolic parameters are indeterminate. The average left ventricular  global longitudinal strain is -8.8 %. The global longitudinal strain is  abnormal.   2. Right ventricular systolic function is normal. The right ventricular  size is normal. There is normal pulmonary artery systolic pressure.   3. Left atrial size was mildly dilated.   4. A small pericardial effusion is present. The pericardial effusion is  circumferential. There is no evidence of cardiac tamponade.   5. The mitral valve is normal in structure. Trivial mitral valve  regurgitation. No evidence of mitral stenosis.   6. The aortic valve has an indeterminant number of cusps. Aortic valve  regurgitation is not visualized. No aortic stenosis is present.   7. The inferior vena cava is normal in size with greater than 50%  respiratory variability, suggesting right atrial pressure of 3 mmHg.   Patient Profile   68 y.o. 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 Unsuccessful coronary sinus and left bundle area lead implant due to  Successful RV ICD lead implantation and generator change   Epicardial LV pacing lead added on 02/05/24   Admitted with progressive SOB, found back in AFlutter w/CVR (paced) and mod-large pleural effusion   Assessment & Plan   Persistent  AFib AFlutter CHA2DS2Vasc is 4, on Eliquis , pt reports excellent medication compliance with no missed doses including AM of admission, Elquis has been uninterrupted here DCCV 03/07/24  In AFlutter on admission   Plan: Amiodarone > ablation  Amiodarone gtt while here Once ready to discharge transition to PO Amiodarone 400mg  BID for 1 week Then > 400mg  daily for 1 week Then > 200mg  diaily  EP follow up will be arranged for ~ 3 weeks to reassess rhythm plan for DCCV if needed ~ Jan-Feb with Dr. Kennyth to look  towards timing of ablation  She will need general cardiology follow up out patient and going forward for ongoing management of her CM.  Further management with attending cardiology team, CCM, IM EP will sign off though remain available Please recall if needed  For questions or updates, please contact [facility] Please consult [email or web address] for contact info under     Signed, Charlies Macario Arthur, PA-C  03/17/2024, 7:56 AM    I have seen, examined the patient, and reviewed the above assessment and plan.    Interval:  No acute overnight events. Patient reports feeling relatively well. No new or acute complaints.   General: Well developed, in no acute distress.  Neck: No JVD.  Cardiac: Normal rate, regular rhythm.  Resp: Normal work of breathing.  Ext: No edema.  Neuro: No gross focal deficits.  Psych: Normal affect.   Assessment and Plan: Ms. Aura is a 68 year old female with past medical history of complete heart block status post dual-chamber pacemaker originally who subsequently developed systolic heart failure secondary to nonischemic cardiomyopathy.  She initially underwent upgrade to a dual-chamber ICD, but unfortunately coronary sinus lead could not be placed.  She then subsequently underwent LV epicardial lead placement and upgrade to CRT-D in October 2025.  Post operatively she developed atrial fibrillation which was discovered by her device.  She  underwent cardioversion to sinus rhythm on 03/07/2024.  She is currently admitted with shortness of breath and was found to have large left-sided pleural effusion, likely related to her recent surgery.  Also discovered to be back in atrial fibrillation.   #Persistent atrial fibrillation: #Hypercoagulable state due to AF: -Patient has elected to proceed with amiodarone in the short-term and hopefully catheter ablation for long-term management.  We will perform amiodarone load as noted above. -Continue therapeutic anticoagulation in setting of recent cardioversion. -EP follow up will be arranged for ~ 3 weeks to reassess rhythm.  If she remains in AF then plan for repeat cardioversion on amiodarone. -Follow-up with me in Jan-Feb 2 readdress candidacy for catheter ablation.  Would like to wait at least 90 days from surgery.  Fonda Kitty, MD 03/17/2024 9:53 PM

## 2024-03-17 NOTE — Assessment & Plan Note (Addendum)
-   left effusion; appears started after thoracotomy on 02/05/2024 but has improved some with diuresis since then however recurring -Underwent left thoracentesis in the ICU on 03/15/2024 with 1.3 L fluid drained - exudative by protein; transudative by LDH - ongoing diuresis being attempted for treatment - given unilateral, differential includes infectious vs CHF vs ?complication from thoracotomy since new onset after procedure (had chest tube 10/24>>10/26) - add on to prior thoracentesis fluid: pH, cell count, and culture

## 2024-03-17 NOTE — Plan of Care (Signed)

## 2024-03-17 NOTE — Progress Notes (Addendum)
 Progress Note  Patient Name: [name] [name] Date of Encounter: 03/17/2024  [facility] Cardiologist: None   Patient Profile     Subjective   Feeling better today.  Less short of breath.  Weight up 3 pounds from yesterday  I's and O's are incomplete  Remains in atrial fibrillation.  She was started on IV Amio per EP Inpatient Medications    Scheduled Meds:  amiodarone  150 mg Intravenous Once   apixaban   5 mg Oral BID   carvedilol   6.25 mg Oral BID WC   Chlorhexidine  Gluconate Cloth  6 each Topical Daily   levothyroxine   112 mcg Oral QHS   pravastatin   20 mg Oral q1800   spironolactone   12.5 mg Oral Daily   Continuous Infusions:  amiodarone     Followed by   amiodarone     magnesium  sulfate bolus IVPB 2 g (03/17/24 0832)   PRN Meds: acetaminophen , clonazePAM , docusate sodium , mouth rinse, oxyCODONE , polyethylene glycol   Vital Signs    Vitals:   03/17/24 0600 03/17/24 0700 03/17/24 0743 03/17/24 0800  BP: (!) 117/52 112/70  120/61  Pulse: 70   69  Resp: 20 15  20   Temp:   98.5 F (36.9 C)   TempSrc:   Axillary   SpO2: 94%   94%  Weight:        Intake/Output Summary (Last 24 hours) at 03/17/2024 0844 Last data filed at 03/17/2024 9167 Gross per 24 hour  Intake 1206 ml  Output 100 ml  Net 1106 ml      03/17/2024    5:00 AM 03/16/2024    9:30 AM 03/01/2024    2:49 PM  Last 3 Weights  Weight (lbs) 185 lb 10 oz 181 lb 10.5 oz 194 lb  Weight (kg) 84.2 kg 82.4 kg 87.998 kg      Telemetry    Atrial fibrillation with controlled ventricular response and V paced rhythm- Personally Reviewed  ECG    No new EKG to review- Personally Reviewed  Physical Exam   GEN: Well nourished, well developed in no acute distress HEENT: Normal NECK: No JVD; No carotid bruits LYMPHATICS: No lymphadenopathy CARDIAC:RRR, no murmurs, rubs, gallops RESPIRATORY:  Clear to auscultation without rales, wheezing or rhonchi  ABDOMEN: Soft, non-tender,  non-distended MUSCULOSKELETAL:  No edema; No deformity  SKIN: Warm and dry NEUROLOGIC:  Alert and oriented x 3 PSYCHIATRIC:  Normal affect  Labs    High Sensitivity Troponin:   Recent Labs  Lab 03/15/24 0836 03/15/24 1022  TROPONINIHS 24* 21*      Chemistry Recent Labs  Lab 03/15/24 1928 03/16/24 0250 03/16/24 1120 03/17/24 0454  NA 120* 124* 122* 121*  K  --  3.9 4.3 3.9  CL  --  85* 84* 85*  CO2  --  26 24 27   GLUCOSE  --  103* 142* 110*  BUN  --  20 21 18   CREATININE  --  1.10* 1.17* 1.00  CALCIUM   --  8.9 8.9 8.7*  PROT 6.7  --   --   --   GFRNONAA  --  55* 51* >60  ANIONGAP  --  13 14 9      Hematology Recent Labs  Lab 03/15/24 0836 03/16/24 0250 03/17/24 0454  WBC 16.0* 10.7* 10.6*  RBC 4.39 3.98 3.89  HGB 13.7 12.4 12.1  HCT 38.7 34.5* 33.8*  MCV 88.2 86.7 86.9  MCH 31.2 31.2 31.1  MCHC 35.4 35.9 35.8  RDW 13.0  13.1 13.2  PLT 303 258 253    BNP Recent Labs  Lab 03/15/24 0836 03/16/24 1120  BNP 741.8* 502.6*     DDimer No results for input(s): DDIMER in the last 168 hours.   CHA2DS2-VASc Score = 5   This indicates a 7.2% annual risk of stroke. The patient's score is based upon: CHF History: 1 HTN History: 1 Diabetes History: 0 Stroke History: 0 Vascular Disease History: 1 Age Score: 1 Gender Score: 1      Radiology    DG CHEST PORT 1 VIEW Result Date: 03/16/2024 EXAM: 1 VIEW(S) XRAY OF THE CHEST 03/16/2024 12:24:00 PM COMPARISON: 03/15/2024. CLINICAL HISTORY: Pleural effusion. FINDINGS: LINES, TUBES AND DEVICES: Left AICD remains in place, unchanged. LUNGS AND PLEURA: Small to moderate left pleural effusion with left lower lobe atelectasis or infiltrate, similar to prior study. No pneumothorax. HEART AND MEDIASTINUM: Cardiomegaly. BONES AND SOFT TISSUES: No acute osseous abnormality. IMPRESSION: 1. Small to moderate left pleural effusion with left lower lobe atelectasis or infiltrate, similar to prior study. Electronically signed  by: Franky Crease MD 03/16/2024 04:46 PM EST RP Workstation: [id number]   ECHOCARDIOGRAM COMPLETE Result Date: 03/16/2024    ECHOCARDIOGRAM REPORT   Patient Name:   [name] [name] Date of Exam: 03/16/2024 Medical Rec #:  [id number]     Height:       64.0 in Accession #:    [id number]    Weight:       181.7 lb Date of Birth:  [birth year]     BSA:          1.878 m Patient Age:    68 years      BP:           104/62 mmHg Patient Gender: F             HR:           70 bpm. Exam Location:  Inpatient Procedure: 2D Echo, Intracardiac Opacification Agent, Cardiac Doppler and Color            Doppler (Both Spectral and Color Flow Doppler were utilized during            procedure). Indications:    Congestive Heart Failure I50.9  History:        Patient has prior history of Echocardiogram examinations, most                 recent 10/16/2020. CHF and Cardiomyopathy, Pacemaker, PAD; Risk                 Factors:Hypertension, Dyslipidemia and Former Smoker.  Sonographer:    Koleen Popper RDCS Referring Phys: [id number] TAYLOR A PARCELLS  Sonographer Comments: Suboptimal apical window. IMPRESSIONS  1. Left ventricular ejection fraction, by estimation, is 20 to 25%. The left ventricle has severely decreased function. The left ventricle demonstrates global hypokinesis. The left ventricular internal cavity size was severely dilated. There is severe eccentric left ventricular hypertrophy. Left ventricular diastolic parameters are consistent with Grade III diastolic dysfunction (restrictive). There is severe global hypokinesis with apical akinesis and swerlling of contrast concerning for early thrombus formation.  2. Right ventricular systolic function is mildly reduced. The right ventricular size is normal. There is normal pulmonary artery systolic pressure.  3. Left atrial size was mildly dilated.  4. Lead in RA.  5. The mitral valve is normal in structure. No evidence of mitral valve regurgitation.  6. At least moderate TR possibly in  setting of lead impingement. Tricuspid  valve regurgitation is moderate.  7. The aortic valve is normal in structure. Aortic valve regurgitation is not visualized.  8. The inferior vena cava is dilated in size with <50% respiratory variability, suggesting right atrial pressure of 15 mmHg. Comparison(s): Prior images reviewed side by side. EF is slightly worse and there is apical akinesis with possible early thrombus formation. No pericardial effusion. FINDINGS  Left Ventricle: Left ventricular ejection fraction, by estimation, is 20 to 25%. The left ventricle has severely decreased function. The left ventricle demonstrates global hypokinesis. Definity contrast agent was given IV to delineate the left ventricular endocardial borders. The left ventricular internal cavity size was severely dilated. There is severe eccentric left ventricular hypertrophy. Left ventricular diastolic parameters are consistent with Grade III diastolic dysfunction (restrictive).  LV Wall Scoring: The apex is akinetic. The entire anterior wall, entire lateral wall, entire septum, and entire inferior wall are hypokinetic. There is severe global hypokinesis with apical akinesis and swerlling of contrast concerning for early thrombus formation. Right Ventricle: The right ventricular size is normal. No increase in right ventricular wall thickness. Right ventricular systolic function is mildly reduced. There is normal pulmonary artery systolic pressure. The tricuspid regurgitant velocity is 2.14 m/s, and with an assumed right atrial pressure of 8 mmHg, the estimated right ventricular systolic pressure is 26.3 mmHg. Left Atrium: Left atrial size was mildly dilated. Right Atrium: Lead in RA. Right atrial size was normal in size. Pericardium: There is no evidence of pericardial effusion. Mitral Valve: The mitral valve is normal in structure. No evidence of mitral valve regurgitation. Tricuspid Valve: At least moderate TR possibly in setting of lead  impingement. The tricuspid valve is normal in structure. Tricuspid valve regurgitation is moderate. Aortic Valve: The aortic valve is normal in structure. Aortic valve regurgitation is not visualized. Pulmonic Valve: The pulmonic valve was not well visualized. Pulmonic valve regurgitation is not visualized. Aorta: The aortic root is normal in size and structure. Venous: The inferior vena cava is dilated in size with less than 50% respiratory variability, suggesting right atrial pressure of 15 mmHg. Additional Comments: A device lead is visualized.  LEFT VENTRICLE PLAX 2D LVIDd:         5.60 cm      Diastology LVIDs:         5.00 cm      LV e' medial:    8.66 cm/s LV PW:         1.20 cm      LV E/e' medial:  10.5 LV IVS:        1.00 cm      LV e' lateral:   4.05 cm/s LVOT diam:     1.70 cm      LV E/e' lateral: 22.4 LV SV:         43 LV SV Index:   23 LVOT Area:     2.27 cm  LV Volumes (MOD) LV vol d, MOD A4C: 223.0 ml LV vol s, MOD A4C: 141.0 ml LV SV MOD A4C:     223.0 ml RIGHT VENTRICLE            IVC RV S prime:     7.01 cm/s  IVC diam: 2.30 cm TAPSE (M-mode): 1.3 cm LEFT ATRIUM           Index LA diam:      4.30 cm 2.29 cm/m LA Vol (A4C): 65.1 ml 34.66 ml/m  AORTIC VALVE LVOT Vmax:   132.00 cm/s LVOT Vmean:  77.600 cm/s LVOT VTI:    0.188 m  AORTA Ao Root diam: 3.10 cm Ao Asc diam:  3.40 cm MITRAL VALVE               TRICUSPID VALVE MV Area (PHT): 4.49 cm    TR Peak grad:   18.3 mmHg MV Decel Time: 169 msec    TR Vmax:        214.00 cm/s MV E velocity: 90.70 cm/s MV A velocity: 22.00 cm/s  SHUNTS MV E/A ratio:  4.12        Systemic VTI:  0.19 m                            Systemic Diam: 1.70 cm Joelle Cedars Tonleu Electronically signed by Joelle Cedars Ny Signature Date/Time: 03/16/2024/12:33:08 PM    Final    DG Chest Port 1 View Result Date: 03/15/2024 EXAM: 1 VIEW(S) XRAY OF THE CHEST 03/15/2024 07:10:00 PM COMPARISON: 03/15/2024 CLINICAL HISTORY: S/P thoracentesis FINDINGS: LINES, TUBES AND  DEVICES: Left chest Automatic Implantable Cardioverter Defibrillator (AICD) with leads stable in position. LUNGS AND PLEURA: Decreased small to moderate left pleural effusion. Associated left basilar retrocardiac opacification. No pneumothorax. HEART AND MEDIASTINUM: Cardiomegaly. Atherosclerotic calcifications of the aorta. BONES AND SOFT TISSUES: No acute osseous abnormality. IMPRESSION: 1. Decreased small to moderate left pleural effusion with associated left basilar retrocardiac opacification. 2. Cardiomegaly. Electronically signed by: Dorethia Molt MD 03/15/2024 07:41 PM EST RP Workstation: [id number]   DG Chest 2 View Result Date: 03/15/2024 CLINICAL DATA:  Shortness of breath 2 days. EXAM: CHEST - 2 VIEW COMPARISON:  03/01/2024 FINDINGS: Left-sided pacemaker unchanged. Lungs are adequately inflated demonstrate opacification over the left base/retrocardiac region with slight interval worsening compatible with moderate size effusion likely with associated basilar atelectasis. Right lung is clear. Stable cardiomegaly. Remainder of the exam is unchanged. IMPRESSION: 1. Slight interval worsening opacification over the left base/retrocardiac region compatible with moderate size effusion likely with associated basilar atelectasis. 2. Stable cardiomegaly. Electronically Signed   By: Toribio Agreste M.D.   On: 03/15/2024 09:06    Patient Profile     68 y.o. female with a hx of CHB s/p ICD, nonischemic cardiomyopathy, chronic HFrEF, persistent A-Fib s/p recent DCCV 03/07/2024, PVD, aortic and coronary calcifications per CT, hypertension, hypothyrdoism who is being seen 03/15/2024 for the evaluation of CHF at the request of Dr. Harold.   Assessment & Plan    Hyponatremia Left pleural effusion Nonischemic cardiomyopathy Shortness of breath Elevated BNP -Normal coronary arteries by cath 2016  -patient has a known history of nonischemic cardiomyopathy with most recent 2D echo in 2022 showing EF of 30 to 35%  with GHK she has not had a 2D echo done since then - Status post attempted CRT-D upgrade by Dr. 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  RV ICD lead was implanted and generator changed out. - Chest x-ray at that time was clear - underwent left thoracotomy for LV epicardial lead placement 1025 and chest x-ray post thoracotomy showed persistent left pleural effusion with retrocardiac opacification of the left lower lobe and follow-up the next day showed retrocardiac left basilar opacity that was felt to be atelectasis or airspace disease. -  Follow-up chest x-ray 02/15/2024 showed improved left basilar airspace opacity with postop atelectasis and mild pulmonary venous congestion.   - Had recurrence of atrial fibrillation on 10/29 and underwent  cardioversion on 11/24 - Follow-up chest x-ray 11/18 showed worsening moderate size left effusion associate with compressive atelectasis in the left base. - Now admitted with 1 week history of worsening shortness of breath, orthopnea but no fever/chills/cough/lower extremity edema/increased abdominal fullness. - BNP mildly elevated  - Found to have a sodium level of 118 and low urine osmolality  - TSH normal 3.12 - Noted to be back in atrial fibrillation - Status post thoracentesis of 1.3 L of yellow appearing fluid>> follow-up chest x-ray showed decreased small to moderate left pleural effusion with associated basilar retrocardiac opacification.  Unclear if retrocardiac opacification is due to compressive atelectasis from her recent effusion.  Will defer to pulmonary -2D echo this admit: EF 20 to 25% with global HK, severe LVE with severe eccentric LVH, G3 DD and possible early forming LV thrombus, mild RV dysfunction and RAP 15 mmHg -Unclear etiology of decline in EF.  Had normal coronary arteries by cath in 2016.  -Overall she does not appear volume overloaded with no lower extremity edema or  abdominal fullness but given worsening LV function and hyponatremia will treat for volume overload -Recommend right and left heart cath to rule out CAD as well as assess filling pressures and help guide diuretic therapy -Informed Consent   Shared Decision Making/Informed Consent The risks [stroke (1 in 1000), death (1 in 1000), kidney failure [usually temporary] (1 in 500), bleeding (1 in 200), allergic reaction [possibly serious] (1 in 200)], benefits (diagnostic support and management of coronary artery disease) and alternatives of a cardiac catheterization were discussed in detail with Ms. Lovins and she is willing to proceed. - Currently on Eliquis  which she will have to hold and start IV heparin  per pharmacy protocol -Will plan heart cath on Friday afternoon - PTA meds for GDMT including carvedilol  6.25 mg twice daily, Lasix  20 mg daily and spironolactone  12.5 mg daily.   - BP stable at 120/61 mmHg and O2 sats 94% on room air - Na back down to 121 today: SCr 1: K+ 3.9 - Continue carvedilol  6.25 mg twice daily, spironolactone  12.5 mg daily - Did not tolerate Entresto  due to hypotension - consider addition of low dose ARB post cath if BP tolerates - Intolerant to SGLT2i - Will place back on Lasix  40 mg IV daily - Follow strict I's and O's, daily weights and renal function while diuresing - Plan for pulmonary is once she is diuresed adequately will obtain chest CT to reassess retrocardiac opacity - Continue 1500 cc fluid restriction   Persistent atrial fibrillation Complete heart block  - Status post PPM in 1990s followed in A-fib clinic - Status post failed CRT-D upgrade in 10/25 with subsequent placement of RV ICD lead with generator change out and then subsequent left thoracotomy with LV epicardial lead placement 02/06/2024. - Noted to be in atrial fibrillation by device interrogation on 02/10/2024 - Ultimately underwent DCCV 11/24 - Appears to be back in atrial flutter with V pacing on  EKG>>documented on device interrogation this am - Continue carvedilol  6.25 mg twice daily  - Eliquis  on hold for Cath tomorrow>>IV Heparin  per pharmacy - EP evaluated yesterday and after discussing treatment options patient has chosen to start amiodarone with goal towards ablation in early 2026 - Currently on IV Amio drip and will transition to oral prior to discharge   Hypertension - Controlled at 120/61 mmHg this morning - Continue carvedilol  6.25 mg twice daily and spironolactone  12.5 mg daily   Hyperlipidemia - LDL goal less  than 100 - LDL 71 in 08/2023 - Lovastatin  20 mg daily    I spent 35 minutes caring for this patient today face to face, ordering and reviewing labs, reviewing records from chest x-ray 12-25, 2D echo 03/16/2024, seeing the patient, documenting in the record, and arranging for a 2D echo  For questions or updates, please contact [facility] Please consult [email or web address] for contact info under        Signed, Wilbert Bihari, MD  03/17/2024, 8:44 AM

## 2024-03-17 NOTE — Assessment & Plan Note (Signed)
 Continue pravastatin 

## 2024-03-18 ENCOUNTER — Telehealth: Payer: Self-pay

## 2024-03-18 ENCOUNTER — Encounter (HOSPITAL_COMMUNITY): Admission: EM | Disposition: A | Payer: Self-pay | Source: Ambulatory Visit | Attending: Internal Medicine

## 2024-03-18 DIAGNOSIS — J90 Pleural effusion, not elsewhere classified: Secondary | ICD-10-CM | POA: Diagnosis not present

## 2024-03-18 DIAGNOSIS — I4819 Other persistent atrial fibrillation: Secondary | ICD-10-CM | POA: Diagnosis not present

## 2024-03-18 DIAGNOSIS — I5043 Acute on chronic combined systolic (congestive) and diastolic (congestive) heart failure: Secondary | ICD-10-CM | POA: Diagnosis not present

## 2024-03-18 DIAGNOSIS — I480 Paroxysmal atrial fibrillation: Secondary | ICD-10-CM

## 2024-03-18 DIAGNOSIS — E871 Hypo-osmolality and hyponatremia: Secondary | ICD-10-CM | POA: Diagnosis not present

## 2024-03-18 DIAGNOSIS — R7989 Other specified abnormal findings of blood chemistry: Secondary | ICD-10-CM

## 2024-03-18 HISTORY — PX: RIGHT/LEFT HEART CATH AND CORONARY ANGIOGRAPHY: CATH118266

## 2024-03-18 LAB — CBC WITH DIFFERENTIAL/PLATELET
Abs Immature Granulocytes: 0.05 K/uL (ref 0.00–0.07)
Basophils Absolute: 0 K/uL (ref 0.0–0.1)
Basophils Relative: 0 %
Eosinophils Absolute: 0 K/uL (ref 0.0–0.5)
Eosinophils Relative: 0 %
HCT: 33.1 % — ABNORMAL LOW (ref 36.0–46.0)
Hemoglobin: 11.8 g/dL — ABNORMAL LOW (ref 12.0–15.0)
Immature Granulocytes: 1 %
Lymphocytes Relative: 7 %
Lymphs Abs: 0.6 K/uL — ABNORMAL LOW (ref 0.7–4.0)
MCH: 30.6 pg (ref 26.0–34.0)
MCHC: 35.6 g/dL (ref 30.0–36.0)
MCV: 86 fL (ref 80.0–100.0)
Monocytes Absolute: 0.3 K/uL (ref 0.1–1.0)
Monocytes Relative: 4 %
Neutro Abs: 7.5 K/uL (ref 1.7–7.7)
Neutrophils Relative %: 88 %
Platelets: 250 K/uL (ref 150–400)
RBC: 3.85 MIL/uL — ABNORMAL LOW (ref 3.87–5.11)
RDW: 12.9 % (ref 11.5–15.5)
WBC: 8.5 K/uL (ref 4.0–10.5)
nRBC: 0 % (ref 0.0–0.2)

## 2024-03-18 LAB — POCT I-STAT EG7
Acid-Base Excess: 3 mmol/L — ABNORMAL HIGH (ref 0.0–2.0)
Acid-Base Excess: 5 mmol/L — ABNORMAL HIGH (ref 0.0–2.0)
Bicarbonate: 27.9 mmol/L (ref 20.0–28.0)
Bicarbonate: 29.2 mmol/L — ABNORMAL HIGH (ref 20.0–28.0)
Calcium, Ion: 1.14 mmol/L — ABNORMAL LOW (ref 1.15–1.40)
Calcium, Ion: 1.2 mmol/L (ref 1.15–1.40)
HCT: 35 % — ABNORMAL LOW (ref 36.0–46.0)
HCT: 35 % — ABNORMAL LOW (ref 36.0–46.0)
Hemoglobin: 11.9 g/dL — ABNORMAL LOW (ref 12.0–15.0)
Hemoglobin: 11.9 g/dL — ABNORMAL LOW (ref 12.0–15.0)
O2 Saturation: 62 %
O2 Saturation: 66 %
Potassium: 3.6 mmol/L (ref 3.5–5.1)
Potassium: 3.8 mmol/L (ref 3.5–5.1)
Sodium: 122 mmol/L — ABNORMAL LOW (ref 135–145)
Sodium: 124 mmol/L — ABNORMAL LOW (ref 135–145)
TCO2: 29 mmol/L (ref 22–32)
TCO2: 30 mmol/L (ref 22–32)
pCO2, Ven: 40.4 mmHg — ABNORMAL LOW (ref 44–60)
pCO2, Ven: 41.2 mmHg — ABNORMAL LOW (ref 44–60)
pH, Ven: 7.446 — ABNORMAL HIGH (ref 7.25–7.43)
pH, Ven: 7.458 — ABNORMAL HIGH (ref 7.25–7.43)
pO2, Ven: 31 mmHg — CL (ref 32–45)
pO2, Ven: 33 mmHg (ref 32–45)

## 2024-03-18 LAB — POCT I-STAT 7, (LYTES, BLD GAS, ICA,H+H)
Acid-Base Excess: 3 mmol/L — ABNORMAL HIGH (ref 0.0–2.0)
Bicarbonate: 26.9 mmol/L (ref 20.0–28.0)
Calcium, Ion: 1.18 mmol/L (ref 1.15–1.40)
HCT: 35 % — ABNORMAL LOW (ref 36.0–46.0)
Hemoglobin: 11.9 g/dL — ABNORMAL LOW (ref 12.0–15.0)
O2 Saturation: 93 %
Potassium: 3.8 mmol/L (ref 3.5–5.1)
Sodium: 122 mmol/L — ABNORMAL LOW (ref 135–145)
TCO2: 28 mmol/L (ref 22–32)
pCO2 arterial: 36.8 mmHg (ref 32–48)
pH, Arterial: 7.473 — ABNORMAL HIGH (ref 7.35–7.45)
pO2, Arterial: 62 mmHg — ABNORMAL LOW (ref 83–108)

## 2024-03-18 LAB — BASIC METABOLIC PANEL WITH GFR
Anion gap: 7 (ref 5–15)
BUN: 16 mg/dL (ref 8–23)
CO2: 27 mmol/L (ref 22–32)
Calcium: 9 mg/dL (ref 8.9–10.3)
Chloride: 88 mmol/L — ABNORMAL LOW (ref 98–111)
Creatinine, Ser: 0.77 mg/dL (ref 0.44–1.00)
GFR, Estimated: >60 mL/min (ref >60)
Glucose, Bld: 205 mg/dL — ABNORMAL HIGH (ref 70–99)
Potassium: 3.8 mmol/L (ref 3.5–5.1)
Sodium: 122 mmol/L — ABNORMAL LOW (ref 135–145)

## 2024-03-18 LAB — HEPARIN LEVEL (UNFRACTIONATED): Heparin Unfractionated: >1.1 [IU]/mL — ABNORMAL HIGH (ref 0.30–0.70)

## 2024-03-18 LAB — APTT: aPTT: 94 s — ABNORMAL HIGH (ref 24–36)

## 2024-03-18 LAB — CYTOLOGY - NON PAP

## 2024-03-18 LAB — MAGNESIUM: Magnesium: 2 mg/dL (ref 1.7–2.4)

## 2024-03-18 SURGERY — RIGHT/LEFT HEART CATH AND CORONARY ANGIOGRAPHY
Anesthesia: LOCAL

## 2024-03-18 MED ORDER — FREE WATER
250.0000 mL | Freq: Once | Status: AC
  Administered 2024-03-18: 250 mL via ORAL

## 2024-03-18 MED ORDER — FENTANYL CITRATE (PF) 100 MCG/2ML IJ SOLN
INTRAMUSCULAR | Status: DC | PRN
  Administered 2024-03-18: 25 ug via INTRAVENOUS

## 2024-03-18 MED ORDER — VERAPAMIL HCL 2.5 MG/ML IV SOLN
INTRAVENOUS | Status: DC | PRN
  Administered 2024-03-18: 10 mL via INTRA_ARTERIAL

## 2024-03-18 MED ORDER — SODIUM CHLORIDE 0.9% FLUSH: 3.0000 mL | INTRAVENOUS | Status: DC | PRN

## 2024-03-18 MED ORDER — MIDAZOLAM HCL 2 MG/2ML IJ SOLN
INTRAMUSCULAR | Status: AC
  Filled 2024-03-18: qty 2

## 2024-03-18 MED ORDER — APIXABAN 5 MG PO TABS
5.0000 mg | ORAL_TABLET | Freq: Two times a day (BID) | ORAL | Status: DC
  Administered 2024-03-18 – 2024-03-19 (×4): 5 mg via ORAL
  Filled 2024-03-18 (×4): qty 1

## 2024-03-18 MED ORDER — FENTANYL CITRATE (PF) 100 MCG/2ML IJ SOLN
INTRAMUSCULAR | Status: AC
  Filled 2024-03-18: qty 2

## 2024-03-18 MED ORDER — LABETALOL HCL 5 MG/ML IV SOLN: 10.0000 mg | INTRAVENOUS | Status: AC | PRN

## 2024-03-18 MED ORDER — LIDOCAINE HCL (PF) 1 % IJ SOLN
INTRAMUSCULAR | Status: DC | PRN
  Administered 2024-03-18 (×2): 5 mL via INTRADERMAL

## 2024-03-18 MED ORDER — ONDANSETRON HCL 4 MG/2ML IJ SOLN: 4.0000 mg | Freq: Four times a day (QID) | INTRAMUSCULAR | Status: DC | PRN

## 2024-03-18 MED ORDER — SODIUM CHLORIDE 0.9% FLUSH
3.0000 mL | Freq: Two times a day (BID) | INTRAVENOUS | Status: DC
  Administered 2024-03-18 – 2024-03-22 (×9): 3 mL via INTRAVENOUS

## 2024-03-18 MED ORDER — IOHEXOL 350 MG/ML SOLN
INTRAVENOUS | Status: DC | PRN
  Administered 2024-03-18: 20 mL

## 2024-03-18 MED ORDER — HEPARIN SODIUM (PORCINE) 1000 UNIT/ML IJ SOLN
INTRAMUSCULAR | Status: AC
  Filled 2024-03-18: qty 10

## 2024-03-18 MED ORDER — MIDAZOLAM HCL (PF) 2 MG/2ML IJ SOLN
INTRAMUSCULAR | Status: DC | PRN
  Administered 2024-03-18: 1 mg via INTRAVENOUS

## 2024-03-18 MED ORDER — LIDOCAINE HCL (PF) 1 % IJ SOLN
INTRAMUSCULAR | Status: AC
  Filled 2024-03-18: qty 30

## 2024-03-18 MED ORDER — HYDRALAZINE HCL 20 MG/ML IJ SOLN: 10.0000 mg | INTRAMUSCULAR | Status: AC | PRN

## 2024-03-18 MED ORDER — HEPARIN (PORCINE) IN NACL 1000-0.9 UT/500ML-% IV SOLN
INTRAVENOUS | Status: DC | PRN
  Administered 2024-03-18 (×2): 500 mL

## 2024-03-18 MED ORDER — HEPARIN SODIUM (PORCINE) 1000 UNIT/ML IJ SOLN
INTRAMUSCULAR | Status: DC | PRN
  Administered 2024-03-18: 4500 [IU] via INTRAVENOUS

## 2024-03-18 MED ORDER — SODIUM CHLORIDE 0.9 % IV SOLN: 250.0000 mL | INTRAVENOUS | Status: DC | PRN

## 2024-03-18 MED ORDER — VERAPAMIL HCL 2.5 MG/ML IV SOLN
INTRAVENOUS | Status: AC
  Filled 2024-03-18: qty 2

## 2024-03-18 SURGICAL SUPPLY — 9 items
CATH 5FR JL3.5 JR4 ANG PIG MP (CATHETERS) IMPLANT
CATH BALLN WEDGE 5F 110CM (CATHETERS) IMPLANT
DEVICE RAD COMP TR BAND LRG (VASCULAR PRODUCTS) IMPLANT
GLIDESHEATH SLEND A-KIT 6F 22G (SHEATH) IMPLANT
GUIDEWIRE .025 260CM (WIRE) IMPLANT
GUIDEWIRE INQWIRE 1.5J.035X260 (WIRE) IMPLANT
PACK CARDIAC CATHETERIZATION (CUSTOM PROCEDURE TRAY) ×1 IMPLANT
SET ATX-X65L (MISCELLANEOUS) IMPLANT
SHEATH GLIDE SLENDER 4/5FR (SHEATH) IMPLANT

## 2024-03-18 NOTE — Progress Notes (Signed)
 PHARMACY - ANTICOAGULATION CONSULT NOTE  Pharmacy Consult:  Heparin  Indication: atrial fibrillation  Allergies  Allergen Reactions   Marshmallow [Althaea Officinalis]     Rash, hives, itching, SOB  Pt states this is no longer an issue for her   Other     Jello - Rash, hives, itching, SOB  Pt states is no longer an issue for her   Contrast Media [Iodinated Contrast Media]     unknown   Latex Rash   Nickel Rash    Patient Measurements: Weight: 82.5 kg (181 lb 14.1 oz) Heparin  dosing weight = 73 kg  Vital Signs: Temp: 98 F (36.7 C) (12/05 0900) Temp Source: Oral (12/05 0900) BP: 109/62 (12/05 0900) Pulse Rate: 70 (12/05 0900)  Labs: Recent Labs    03/16/24 0250 03/16/24 1120 03/17/24 0454 03/17/24 1809 03/18/24 0917  HGB 12.4  --  12.1  --  11.8*  HCT 34.5*  --  33.8*  --  33.1*  PLT 258  --  253  --  250  APTT  --   --   --  41* 94*  HEPARINUNFRC  --   --   --  >1.10* >1.10*  CREATININE 1.10* 1.17* 1.00  --  0.77    Estimated Creatinine Clearance: 71.9 mL/min (by C-G formula based on SCr of 0.77 mg/dL).   Medical History: Past Medical History:  Diagnosis Date   AICD (automatic cardioverter/defibrillator) present    St. Jude   Anxiety    takes Klonopin  daily as needed   Arthritis    Back pain    reason unknown   Chronic systolic CHF (congestive heart failure) (HCC)    a. Echo 10/2013: EF 40-45 (apical images 35-40%); b. Echo 12/2014: EF 35-40%; c. 06/2020 Echo: EF 25-30%, glob HK, Gr2 DD, RVSP ~ ; d. 10/2020 Echo: EF 25-30%, glob HK. Nl RV fxn, nl PASP, mildly dil LA. Small peric eff, triv MR.   Complete AV block (HCC)    a. s/p pacemaker insertion 1996; b. s/p contralateral side St. Jude generator change 11/2014.   Dizziness    occasionally   GERD (gastroesophageal reflux disease)    takes Zantac  daily as needed   Gout    Headache    occasionally   History of blood clots    in heart-was on Coumadin---this many yrs ago   History of colon polyps     benign   Hyperlipidemia    takes Lovastatin  daily   Hypertension    hx of-since hip replacement MD took resident off meds 10/30/14 b/c well controlled   Hypothyroid    takes Synthroid  daily   Joint pain    Joint swelling    Moderate Pericardial effusion    a. 07/2020 Echo: Mod effusion w/o tamponade; b. 10/2020 Echo: small peric eff w/o tamponade.   NICM (nonischemic cardiomyopathy) (HCC)    a. tachycardia induced->EF 40-45% (2010), 35-40% (10/2013), 35-40% Gr1 DD, mod TR (12/2014);  b. 01/2015 Cath: Nl cors, EF 30-35%, glob HK; c. 06/2020 Echo: EF 25-30%; d. 10/2020 Echo: EF 25-30%.   Numbness and tingling    left leg and right arm   Ovarian cancer (HCC)    ovarian, skin    Pacemaker lead failure--noise on atrial greater than ventricular lead with ventricular backup pulse pacing 10/17/2014   Peripheral artery disease    stent on lt illiac     Assessment: 8 YOF presented with SOB and chest discomfort.  Patient was continued on PTA Eliquis  for history  of Afib.  Now requiring cath, so Pharmacy consulted to transition patient from Eliquis  to IV heparin .  Last Eliquis  dose on 12/3 at 2116l; will use aPTT to guide heparin  dosing until heparin  level and APTT correlate.  CBC stable; no bleeding reported.  APTT this morning within goal range, no overt bleeding or complications noted. Heparin  level remains falsely elevated from recent DOAC.  No overt bleeding or complications noted.  Goal of Therapy:  Heparin  level 0.3-0.7 units/ml aPTT 66-102 seconds Monitor platelets by anticoagulation protocol: Yes   Plan:  Continue IV heparin  at 1250 units/hr. Daily heparin  level, aPTT and CBC F/u plans for anticoagulation after cath today.  Harlene Barlow, [employer], [facility] [place] Clinical Pharmacist  03/18/2024 10:34 AM   [facility] pharmacy phone numbers are listed on [email or web address]

## 2024-03-18 NOTE — Progress Notes (Addendum)
 Progress Note  Patient Name: [name] [name] Date of Encounter: 03/18/2024  [facility] Cardiologist: None   Patient Profile     Subjective   Less short of breath today.  Weight down 4 pounds from yesterday  She put out 1.7 L yesterday and is net +555 cc since admission  Heart rate stable at 70 bpm Inpatient Medications    Scheduled Meds:  carvedilol   6.25 mg Oral BID WC   Chlorhexidine  Gluconate Cloth  6 each Topical Daily   feeding supplement  237 mL Oral BID BM   furosemide   40 mg Intravenous Daily   levothyroxine   112 mcg Oral QHS   pravastatin   20 mg Oral q1800   spironolactone   12.5 mg Oral Daily   Continuous Infusions:  amiodarone  30 mg/hr (03/17/24 2148)   heparin  1,250 Units/hr (03/17/24 2032)   PRN Meds: acetaminophen , clonazePAM , docusate sodium , mouth rinse, oxyCODONE , polyethylene glycol   Vital Signs    Vitals:   03/17/24 1830 03/17/24 1900 03/17/24 2300 03/18/24 0424  BP: 117/71 104/63 121/65 125/82  Pulse: 79 75 70 70  Resp: 20 20 20 19   Temp: 97.9 F (36.6 C) 98 F (36.7 C) 97.9 F (36.6 C) 98 F (36.7 C)  TempSrc: Oral Oral Oral Oral  SpO2: 93% 93% 92% 94%  Weight:    82.5 kg    Intake/Output Summary (Last 24 hours) at 03/18/2024 9271 Last data filed at 03/18/2024 9293 Gross per 24 hour  Intake 1719.07 ml  Output 1100 ml  Net 619.07 ml      03/18/2024    4:24 AM 03/17/2024    5:00 AM 03/16/2024    9:30 AM  Last 3 Weights  Weight (lbs) 181 lb 14.1 oz 185 lb 10 oz 181 lb 10.5 oz  Weight (kg) 82.5 kg 84.2 kg 82.4 kg      Telemetry    Atrial fibrillation with ventricular paced rhythm- Personally Reviewed  ECG    No new EKG to review- Personally Reviewed  Physical Exam   GEN: Well nourished, well developed in no acute distress HEENT: Normal NECK: No JVD; No carotid bruits LYMPHATICS: No lymphadenopathy CARDIAC:RRR, no murmurs, rubs, gallops RESPIRATORY:  Clear to auscultation without rales, wheezing or rhonchi   ABDOMEN: Soft, non-tender, non-distended MUSCULOSKELETAL:  No edema; No deformity  SKIN: Warm and dry NEUROLOGIC:  Alert and oriented x 3 PSYCHIATRIC:  Normal affect  Labs    High Sensitivity Troponin:   Recent Labs  Lab 03/15/24 0836 03/15/24 1022  TROPONINIHS 24* 21*      Chemistry Recent Labs  Lab 03/15/24 1928 03/16/24 0250 03/16/24 1120 03/17/24 0454  NA 120* 124* 122* 121*  K  --  3.9 4.3 3.9  CL  --  85* 84* 85*  CO2  --  26 24 27   GLUCOSE  --  103* 142* 110*  BUN  --  20 21 18   CREATININE  --  1.10* 1.17* 1.00  CALCIUM   --  8.9 8.9 8.7*  PROT 6.7  --   --   --   GFRNONAA  --  55* 51* >60  ANIONGAP  --  13 14 9      Hematology Recent Labs  Lab 03/15/24 0836 03/16/24 0250 03/17/24 0454  WBC 16.0* 10.7* 10.6*  RBC 4.39 3.98 3.89  HGB 13.7 12.4 12.1  HCT 38.7 34.5* 33.8*  MCV 88.2 86.7 86.9  MCH 31.2 31.2 31.1  MCHC 35.4 35.9 35.8  RDW 13.0 13.1 13.2  PLT 303 258 253    BNP Recent Labs  Lab 03/15/24 0836 03/16/24 1120  BNP 741.8* 502.6*     DDimer No results for input(s): DDIMER in the last 168 hours.   CHA2DS2-VASc Score = 5   This indicates a 7.2% annual risk of stroke. The patient's score is based upon: CHF History: 1 HTN History: 1 Diabetes History: 0 Stroke History: 0 Vascular Disease History: 1 Age Score: 1 Gender Score: 1      Radiology    DG CHEST PORT 1 VIEW Result Date: 03/17/2024 CLINICAL DATA:  Shortness of breath. EXAM: PORTABLE CHEST 1 VIEW COMPARISON:  03/16/2024 FINDINGS: Stable enlarged cardiac silhouette, moderate-sized left pleural effusion and left basilar atelectasis. Clear right lung. Stable mild diffuse peribronchial thickening. Stable left subclavian pacer and AICD leads. Tortuous and partially calcified thoracic aorta. Unremarkable bones. IMPRESSION: 1. Stable moderate-sized left pleural effusion and left basilar atelectasis. 2. Stable cardiomegaly. 3. Stable mild chronic bronchitic changes. Electronically  Signed   By: Elspeth Bathe M.D.   On: 03/17/2024 13:42   DG CHEST PORT 1 VIEW Result Date: 03/16/2024 EXAM: 1 VIEW(S) XRAY OF THE CHEST 03/16/2024 12:24:00 PM COMPARISON: 03/15/2024. CLINICAL HISTORY: Pleural effusion. FINDINGS: LINES, TUBES AND DEVICES: Left AICD remains in place, unchanged. LUNGS AND PLEURA: Small to moderate left pleural effusion with left lower lobe atelectasis or infiltrate, similar to prior study. No pneumothorax. HEART AND MEDIASTINUM: Cardiomegaly. BONES AND SOFT TISSUES: No acute osseous abnormality. IMPRESSION: 1. Small to moderate left pleural effusion with left lower lobe atelectasis or infiltrate, similar to prior study. Electronically signed by: Franky Crease MD 03/16/2024 04:46 PM EST RP Workstation: [id number]   ECHOCARDIOGRAM COMPLETE Result Date: 03/16/2024    ECHOCARDIOGRAM REPORT   Patient Name:   [name] [name] Date of Exam: 03/16/2024 Medical Rec #:  [id number]     Height:       64.0 in Accession #:    [id number]    Weight:       181.7 lb Date of Birth:  Jul 09, 1955     BSA:          1.878 m Patient Age:    68 years      BP:           104/62 mmHg Patient Gender: F             HR:           70 bpm. Exam Location:  Inpatient Procedure: 2D Echo, Intracardiac Opacification Agent, Cardiac Doppler and Color            Doppler (Both Spectral and Color Flow Doppler were utilized during            procedure). Indications:    Congestive Heart Failure I50.9  History:        Patient has prior history of Echocardiogram examinations, most                 recent 10/16/2020. CHF and Cardiomyopathy, Pacemaker, PAD; Risk                 Factors:Hypertension, Dyslipidemia and Former Smoker.  Sonographer:    Koleen Popper RDCS Referring Phys: [id number] TAYLOR A PARCELLS  Sonographer Comments: Suboptimal apical window. IMPRESSIONS  1. Left ventricular ejection fraction, by estimation, is 20 to 25%. The left ventricle has severely decreased function. The left ventricle demonstrates global hypokinesis.  The left ventricular internal cavity size was severely dilated. There is severe eccentric left ventricular hypertrophy. Left  ventricular diastolic parameters are consistent with Grade III diastolic dysfunction (restrictive). There is severe global hypokinesis with apical akinesis and swerlling of contrast concerning for early thrombus formation.  2. Right ventricular systolic function is mildly reduced. The right ventricular size is normal. There is normal pulmonary artery systolic pressure.  3. Left atrial size was mildly dilated.  4. Lead in RA.  5. The mitral valve is normal in structure. No evidence of mitral valve regurgitation.  6. At least moderate TR possibly in setting of lead impingement. Tricuspid valve regurgitation is moderate.  7. The aortic valve is normal in structure. Aortic valve regurgitation is not visualized.  8. The inferior vena cava is dilated in size with <50% respiratory variability, suggesting right atrial pressure of 15 mmHg. Comparison(s): Prior images reviewed side by side. EF is slightly worse and there is apical akinesis with possible early thrombus formation. No pericardial effusion. FINDINGS  Left Ventricle: Left ventricular ejection fraction, by estimation, is 20 to 25%. The left ventricle has severely decreased function. The left ventricle demonstrates global hypokinesis. Definity  contrast agent was given IV to delineate the left ventricular endocardial borders. The left ventricular internal cavity size was severely dilated. There is severe eccentric left ventricular hypertrophy. Left ventricular diastolic parameters are consistent with Grade III diastolic dysfunction (restrictive).  LV Wall Scoring: The apex is akinetic. The entire anterior wall, entire lateral wall, entire septum, and entire inferior wall are hypokinetic. There is severe global hypokinesis with apical akinesis and swerlling of contrast concerning for early thrombus formation. Right Ventricle: The right  ventricular size is normal. No increase in right ventricular wall thickness. Right ventricular systolic function is mildly reduced. There is normal pulmonary artery systolic pressure. The tricuspid regurgitant velocity is 2.14 m/s, and with an assumed right atrial pressure of 8 mmHg, the estimated right ventricular systolic pressure is 26.3 mmHg. Left Atrium: Left atrial size was mildly dilated. Right Atrium: Lead in RA. Right atrial size was normal in size. Pericardium: There is no evidence of pericardial effusion. Mitral Valve: The mitral valve is normal in structure. No evidence of mitral valve regurgitation. Tricuspid Valve: At least moderate TR possibly in setting of lead impingement. The tricuspid valve is normal in structure. Tricuspid valve regurgitation is moderate. Aortic Valve: The aortic valve is normal in structure. Aortic valve regurgitation is not visualized. Pulmonic Valve: The pulmonic valve was not well visualized. Pulmonic valve regurgitation is not visualized. Aorta: The aortic root is normal in size and structure. Venous: The inferior vena cava is dilated in size with less than 50% respiratory variability, suggesting right atrial pressure of 15 mmHg. Additional Comments: A device lead is visualized.  LEFT VENTRICLE PLAX 2D LVIDd:         5.60 cm      Diastology LVIDs:         5.00 cm      LV e' medial:    8.66 cm/s LV PW:         1.20 cm      LV E/e' medial:  10.5 LV IVS:        1.00 cm      LV e' lateral:   4.05 cm/s LVOT diam:     1.70 cm      LV E/e' lateral: 22.4 LV SV:         43 LV SV Index:   23 LVOT Area:     2.27 cm  LV Volumes (MOD) LV vol d, MOD A4C: 223.0 ml LV  vol s, MOD A4C: 141.0 ml LV SV MOD A4C:     223.0 ml RIGHT VENTRICLE            IVC RV S prime:     7.01 cm/s  IVC diam: 2.30 cm TAPSE (M-mode): 1.3 cm LEFT ATRIUM           Index LA diam:      4.30 cm 2.29 cm/m LA Vol (A4C): 65.1 ml 34.66 ml/m  AORTIC VALVE LVOT Vmax:   132.00 cm/s LVOT Vmean:  77.600 cm/s LVOT VTI:     0.188 m  AORTA Ao Root diam: 3.10 cm Ao Asc diam:  3.40 cm MITRAL VALVE               TRICUSPID VALVE MV Area (PHT): 4.49 cm    TR Peak grad:   18.3 mmHg MV Decel Time: 169 msec    TR Vmax:        214.00 cm/s MV E velocity: 90.70 cm/s MV A velocity: 22.00 cm/s  SHUNTS MV E/A ratio:  4.12        Systemic VTI:  0.19 m                            Systemic Diam: 1.70 cm Joelle Azobou Tonleu Electronically signed by Joelle Cedars Tonleu Signature Date/Time: 03/16/2024/12:33:08 PM    Final     Patient Profile     68 y.o. female with a hx of CHB s/p ICD, nonischemic cardiomyopathy, chronic HFrEF, persistent A-Fib s/p recent DCCV 03/07/2024, PVD, aortic and coronary calcifications per CT, hypertension, hypothyrdoism who is being seen 03/15/2024 for the evaluation of CHF at the request of Dr. Harold.   Assessment & Plan    Hyponatremia Left pleural effusion Nonischemic cardiomyopathy Shortness of breath Elevated BNP -Normal coronary arteries by cath 2016  -patient has a known history of nonischemic cardiomyopathy with most recent 2D echo in 2022 showing EF of 30 to 35% with GHK she has not had a 2D echo done since then - Status post attempted CRT-D upgrade by Dr. 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  RV ICD lead was implanted and generator changed out. - Chest x-ray at that time was clear - underwent left thoracotomy for LV epicardial lead placement 1025 and chest x-ray post thoracotomy showed persistent left pleural effusion with retrocardiac opacification of the left lower lobe and follow-up the next day showed retrocardiac left basilar opacity that was felt to be atelectasis or airspace disease. -  Follow-up chest x-ray 02/15/2024 showed improved left basilar airspace opacity with postop atelectasis and mild pulmonary venous congestion.   - Had recurrence of atrial fibrillation on 10/29 and underwent cardioversion on 11/24 - Follow-up  chest x-ray 11/18 showed worsening moderate size left effusion associate with compressive atelectasis in the left base. - Now admitted with 1 week history of worsening shortness of breath, orthopnea but no fever/chills/cough/lower extremity edema/increased abdominal fullness. - BNP mildly elevated  - Found to have a sodium level of 118 and low urine osmolality  - TSH normal 3.12 - Noted to be back in atrial fibrillation - Status post thoracentesis of 1.3 L of yellow appearing fluid -2D echo this admit: EF 20 to 25% with global HK, severe LVE with severe eccentric LVH, G3 DD and possible early forming LV thrombus, mild RV dysfunction and RAP 15 mmHg -Unclear etiology of decline  in EF.  Had normal coronary arteries by cath in 2016.  -Overall she does not appear volume overloaded with no lower extremity edema or abdominal fullness but given worsening LV function and hyponatremia >>will treat for volume overload -BP stable at 125/82 mmHg and O2 sats 96% on room air - Na back down to 121 today: SCr 1: K+ 3.9 yesterday and BMP pending today - Did not tolerate Entresto  due to hypotension - Intolerant to SGLT2i - Currently on Lasix  40 mg IV which we will continue until we get right heart cath dated today - Follow strict I's and O's, daily weights and renal function while diuresing - Plan for pulmonary is once she is diuresed adequately will obtain chest CT to reassess retrocardiac opacity - Continue 1500 cc fluid restriction - Continue carvedilol  6.25 mg twice daily and spironolactone  12.5 mg daily - Will give a trial of low-dose ARB once she has been adequately diuresed   Persistent atrial fibrillation Complete heart block  - Status post PPM in 1990s followed in A-fib clinic - Status post failed CRT-D upgrade in 10/25 with subsequent placement of RV ICD lead with generator change out and then subsequent left thoracotomy with LV epicardial lead placement 02/06/2024. - Noted to be in atrial  fibrillation by device interrogation on 02/10/2024 - Ultimately underwent DCCV 11/24 - back in atrial flutter with V pacing on EKG>>documented on device interrogation this am - EP evaluated  and after discussing treatment options patient has chosen to start amiodarone  with goal towards ablation in early 2026 - Currently on IV Amio drip and will transition to oral prior to discharge -Currently on IV heparin  with Eliquis  on hold for cardiac cath   Hypertension - BP well-controlled - Continue Failor 6.25 mg twice daily and spironolactone  12.5 mg daily  Hyperlipidemia - LDL goal less than 100 - LDL 71 in 08/2023 - Lovastatin  20 mg daily    I spent 35 minutes caring for this patient today face to face, ordering and reviewing labs, reviewing records from chest x-ray 12-25, 2D echo 03/16/2024, seeing the patient, documenting in the record, and arranging for a 2D echo  For questions or updates, please contact [facility] Please consult [email or web address] for contact info under        Signed, Wilbert Bihari, MD  03/18/2024, 7:28 AM

## 2024-03-18 NOTE — Plan of Care (Signed)

## 2024-03-18 NOTE — Telephone Encounter (Signed)
 Please set up a pulmonary clinic follow up at [facility] clinic in 2-3 weeks

## 2024-03-18 NOTE — Interval H&P Note (Signed)
 History and Physical Interval Note:  03/18/2024 7:46 AM  [name] [name]  has presented today for surgery, with the diagnosis of worsening EF.  The various methods of treatment have been discussed with the patient and family. After consideration of risks, benefits and other options for treatment, the patient has consented to  Procedure(s): RIGHT/LEFT HEART CATH AND CORONARY ANGIOGRAPHY (N/A) as a surgical intervention.  The patient's history has been reviewed, patient examined, no change in status, stable for surgery.  I have reviewed the patient's chart and labs.  Questions were answered to the patient's satisfaction.     Divonte Senger J Gael Londo

## 2024-03-18 NOTE — Progress Notes (Signed)
 NAME:  [name] [name], MRN:  [id number], DOB:  07/11/1955, LOS: 3 ADMISSION DATE:  03/15/2024, CONSULTATION DATE:  12/2 REFERRING MD:  Sherlean Carota PA-C CHIEF COMPLAINT:  Hyponatremia   History of Present Illness:  [name] [name] is a 68 year old female with a pertinent past medical history of HFrEF (EF 20-25% via TEE on 11/24), AV block s/p permanent pacemaker, persistent atrial fibrillation s/p cardioversion 11/24, HTN, hypothyroidism who presented to [facility] ED with SOB and chest discomfort over the past week that acutely worsened over the last few days. She was unable to sleep due to her SOB and noted it worsened with exertion. She has concurrent chest tightness especially when she inhales deeply, however this does not radiate and describes it as a dull pain. She denies recent illness, N/V, or other pain and has been able to adhere to all her medications.   When evaluated in the ED, she was found to have severe hyponatremia of 119, leukocytosis of 16, UA showing bacteria but denied symptoms. PCCM was consulted to admit patient for severe hyponatremia and management.   Pertinent  Medical History   Past Medical History:  Diagnosis Date   AICD (automatic cardioverter/defibrillator) present    St. Jude   Anxiety    takes Klonopin  daily as needed   Arthritis    Back pain    reason unknown   Chronic systolic CHF (congestive heart failure) (HCC)    a. Echo 10/2013: EF 40-45 (apical images 35-40%); b. Echo 12/2014: EF 35-40%; c. 06/2020 Echo: EF 25-30%, glob HK, Gr2 DD, RVSP ~ ; d. 10/2020 Echo: EF 25-30%, glob HK. Nl RV fxn, nl PASP, mildly dil LA. Small peric eff, triv MR.   Complete AV block (HCC)    a. s/p pacemaker insertion 1996; b. s/p contralateral side St. Jude generator change 11/2014.   Dizziness    occasionally   GERD (gastroesophageal reflux disease)    takes Zantac  daily as needed   Gout    Headache    occasionally   History of blood clots    in heart-was on Coumadin---this  many yrs ago   History of colon polyps    benign   Hyperlipidemia    takes Lovastatin  daily   Hypertension    hx of-since hip replacement MD took resident off meds 10/30/14 b/c well controlled   Hypothyroid    takes Synthroid  daily   Joint pain    Joint swelling    Moderate Pericardial effusion    a. 07/2020 Echo: Mod effusion w/o tamponade; b. 10/2020 Echo: small peric eff w/o tamponade.   NICM (nonischemic cardiomyopathy) (HCC)    a. tachycardia induced->EF 40-45% (2010), 35-40% (10/2013), 35-40% Gr1 DD, mod TR (12/2014);  b. 01/2015 Cath: Nl cors, EF 30-35%, glob HK; c. 06/2020 Echo: EF 25-30%; d. 10/2020 Echo: EF 25-30%.   Numbness and tingling    left leg and right arm   Ovarian cancer (HCC)    ovarian, skin    Pacemaker lead failure--noise on atrial greater than ventricular lead with ventricular backup pulse pacing 10/17/2014   Peripheral artery disease    stent on lt illiac   Significant Hospital Events: Including procedures, antibiotic start and stop dates in addition to other pertinent events   12/2- Admitted to ICU for management of AoC HFrEF with severe hyponatremia of 119 12/2- Thoracentesis removing 1.3L of yellow fluid. LDH 61, total protein 4.3.  12/04- transferred out of ICU   Interim History / Subjective:  Transferred out of  ICU 03/17/2024 Remains on room air Underwent heart cath today  Objective   Blood pressure 120/64, pulse 70, temperature 97.7 F (36.5 C), temperature source Oral, resp. rate 19, weight 82.5 kg, SpO2 93%.        Intake/Output Summary (Last 24 hours) at 03/18/2024 1432 Last data filed at 03/18/2024 1211 Gross per 24 hour  Intake 1276.56 ml  Output 1650 ml  Net -373.44 ml   Filed Weights   03/16/24 0930 03/17/24 0500 03/18/24 0424  Weight: 82.4 kg 84.2 kg 82.5 kg    Examination: General: Age-appropriate female, NAD HENT: [place]/AT, Moist mucous membranes, no JVD Respiratory: slight crackles at bilateral bases, decreased BS left lower lung  field, no wheezing or rales Cardiovascular: irregular rhythm, no murmurs/rubs/gallops Abdomen: soft, bowel sounds +, no tenderness, no distension  MSK: , no erythema, swelling or bruising Extremities: dry, no edema, bilateral UE/LE 4/5 strength  Neuro: A&Ox4, awake. Pleasant, follows commands.   Resolved problem list   Assessment and Plan  chronic HFrEF, hyperlipidemia, high degree AV block status post permanent pacemaker, persistent A-fib status post recent cardioversion and hypertension who presented with shortness of breath and chest discomfort for 4 to 5 days and got worse for last 3 days.  Of note patient underwent cardioversion on 11/24, at that time TEE showed her EF was 20 to 25%. Status post failed CRT-D upgrade in 10/25 with subsequent placement of RV ICD lead with generator change out and then subsequent left thoracotomy with LV epicardial lead placement 02/06/2024.    Hypoosmolar  hyponatremia Acute on chronic CHF Chronic non ischemic cardiomyopathy with EF 20-25% Left pleural effusion- s/p drainage - transudative based on analysis  Chest xray shows recurrence of left pleural fluid. Pt is on room air. Reports her breathing has significantly improved compared to admission. RHC today show increased filling pressures The left pleural fluid has been sampled and is c/w transudative effusion, with slightly increased protein likely secondary to interventions and chest tube in that space in last admission Has had effusion or atelectasis in that area since prior admission in October Although the fluid has re accumulated, no indication for drainage at this time given high likelihood of recurrence.  Pt is agreeable to the plan, doesn't want thoracentesis at this time. However, if it needs drainage again due to dyspnea or concerns for infection, recommend CT chest  afterwards. Continued diuresis as tolerated. Continue incentive spirometry Of note, pt had a screening ct chest in aug 2024  without any concerning lung mass and her Chest Xray until September 2025 were clear as well.  D/w pt and family Will set up a pulm clinic follow [facility] as per her preference/closer to her.  Will follow peripherally    Labs   CBC: Recent Labs  Lab 03/15/24 0836 03/16/24 0250 03/17/24 0454 03/18/24 0805 03/18/24 0809 03/18/24 0917  WBC 16.0* 10.7* 10.6*  --   --  8.5  NEUTROABS  --   --  7.7  --   --  7.5  HGB 13.7 12.4 12.1 11.9*  11.9* 11.9* 11.8*  HCT 38.7 34.5* 33.8* 35.0*  35.0* 35.0* 33.1*  MCV 88.2 86.7 86.9  --   --  86.0  PLT 303 258 253  --   --  250    Basic Metabolic Panel: Recent Labs  Lab 03/15/24 0836 03/15/24 1204 03/15/24 1928 03/16/24 0250 03/16/24 1120 03/17/24 0454 03/18/24 0805 03/18/24 0809 03/18/24 0917  NA 119* 118*   < > 124* 122*  121* 124*  122* 122* 122*  K 5.2* 4.4  --  3.9 4.3 3.9 3.6  3.8 3.8 3.8  CL 84* 86*  --  85* 84* 85*  --   --  88*  CO2 21* 22  --  26 24 27   --   --  27  GLUCOSE 168* 132*  --  103* 142* 110*  --   --  205*  BUN 22 19  --  20 21 18   --   --  16  CREATININE 1.25* 1.10*  --  1.10* 1.17* 1.00  --   --  0.77  CALCIUM  9.5 9.0  --  8.9 8.9 8.7*  --   --  9.0  MG 1.7  --   --  1.7 1.8 1.7  --   --  2.0  PHOS  --   --   --  4.4  --   --   --   --   --    < > = values in this interval not displayed.   GFR: Estimated Creatinine Clearance: 71.9 mL/min (by C-G formula based on SCr of 0.77 mg/dL). Recent Labs  Lab 03/15/24 0836 03/16/24 0250 03/17/24 0454 03/18/24 0917  WBC 16.0* 10.7* 10.6* 8.5    Liver Function Tests: Recent Labs  Lab 03/15/24 1928  PROT 6.7   No results for input(s): LIPASE, AMYLASE in the last 168 hours. No results for input(s): AMMONIA in the last 168 hours.  ABG    Component Value Date/Time   PHART 7.473 (H) 03/18/2024 0805   PCO2ART 36.8 03/18/2024 0805   PO2ART 62 (L) 03/18/2024 0805   HCO3 29.2 (H) 03/18/2024 0809   TCO2 30 03/18/2024 0809   O2SAT 66  03/18/2024 0809   CBG: Recent Labs  Lab 03/15/24 1529  GLUCAP 161*   Past Medical History:  She,  has a past medical history of AICD (automatic cardioverter/defibrillator) present, Anxiety, Arthritis, Back pain, Chronic systolic CHF (congestive heart failure) (HCC), Complete AV block (HCC), Dizziness, GERD (gastroesophageal reflux disease), Gout, Headache, History of blood clots, History of colon polyps, Hyperlipidemia, Hypertension, Hypothyroid, Joint pain, Joint swelling, Moderate Pericardial effusion, NICM (nonischemic cardiomyopathy) (HCC), Numbness and tingling, Ovarian cancer (HCC), Pacemaker lead failure--noise on atrial greater than ventricular lead with ventricular backup pulse pacing (10/17/2014), and Peripheral artery disease.   Surgical History:   Past Surgical History:  Procedure Laterality Date   ABDOMINAL HYSTERECTOMY     ANGIOPLASTY / STENTING ILIAC     BIV UPGRADE N/A 12/29/2023   Procedure: BIV UPGRADE;  Surgeon: Cindie Ole DASEN, MD;  Location: [facility] INVASIVE CV LAB;  Service: Cardiovascular;  Laterality: N/A;   CARDIAC CATHETERIZATION N/A 02/08/2015   Procedure: Left Heart Cath and Coronary Angiography;  Surgeon: Deatrice DELENA Cage, MD;  Location: [facility] INVASIVE CV LAB;  Service: Cardiovascular;  Laterality: N/A;   CARDIOVERSION N/A 03/07/2024   Procedure: CARDIOVERSION;  Surgeon: Raford Riggs, MD;  Location: [facility] INVASIVE CV LAB;  Service: Cardiovascular;  Laterality: N/A;   CERVICAL CERCLAGE  1994   COLONOSCOPY     DG BARIUM SWALLOW ([facility] HX)     DILATION AND CURETTAGE OF UTERUS     EP IMPLANTABLE DEVICE Left 12/06/2014   Procedure: Implantation of cardiac resynchronization therapy pacemaker;  Surgeon: Danelle LELON Birmingham, MD;  Location: [facility] OR;  Service: Cardiovascular;  Laterality: Left;   EPICARDIAL PACING LEAD PLACEMENT N/A 02/05/2024   Procedure: INSERTION, EPICARDIAL ELECTRODE LEAD;  Surgeon: Lucas Dorise POUR, MD;  Location: [facility]  OR;  Service: Thoracic;  Laterality:  N/A;   HERNIA MESH REMOVAL Right    incisional   HERNIA REPAIR     ILIAC ARTERY STENT  04/05/2010   Left artery stenting   INSERT / REPLACE / REMOVE PACEMAKER     JOINT REPLACEMENT  10/31/2014   hip   KNEE SURGERY Right    LEAD INSERTION N/A 12/29/2023   Procedure: LEAD INSERTION;  Surgeon: Cindie Ole DASEN, MD;  Location: [facility] INVASIVE CV LAB;  Service: Cardiovascular;  Laterality: N/A;   PACEMAKER INSERTION  2012   Had replaced in July 2012, first placed in 1996   PACEMAKER LEAD REMOVAL Right 12/06/2014   Procedure: REMOVAL OF RV AND RA PACEMAKER LEADS;  Surgeon: Danelle LELON Birmingham, MD;  Location: [facility] OR;  Service: Cardiovascular;  Laterality: Right;  DR. BARTLE TO BACK UP    PACEMAKER PLACEMENT Left 12/06/2014   THORACOTOMY Left 02/05/2024   Procedure: THORACOTOMY, MAJOR;  Surgeon: Lucas Dorise POUR, MD;  Location: [facility] OR;  Service: Thoracic;  Laterality: Left;   TOTAL ABDOMINAL HYSTERECTOMY W/ BILATERAL SALPINGOOPHORECTOMY  07/2008   Ovarian cancer   TOTAL HIP ARTHROPLASTY Right 10/31/2014   Procedure: TOTAL HIP ARTHROPLASTY ANTERIOR APPROACH;  Surgeon: Ozell Flake, MD;  Location: [facility] ORS;  Service: Orthopedics;  Laterality: Right;     Social History:   reports that she quit smoking about 15 years ago. Her smoking use included cigarettes. She has never used smokeless tobacco. She reports current alcohol use. She reports that she does not use drugs.   Family History:  Her family history includes Arrhythmia in her son; Breast cancer in her paternal aunt and sister; Cancer in her father; Diabetes in her brother; Hyperlipidemia in her mother; Lung cancer in her father; Polymyalgia rheumatica in her mother.   Allergies Allergies  Allergen Reactions   Marshmallow [Althaea Officinalis]     Rash, hives, itching, SOB  Pt states this is no longer an issue for her   Other     Jello - Rash, hives, itching, SOB  Pt states is no longer an issue for her   Contrast Media [Iodinated Contrast  Media]     unknown   Latex Rash   Nickel Rash     Home Medications  Prior to Admission medications   Medication Sig Start Date End Date Taking? Authorizing Provider  acetaminophen  (TYLENOL ) 325 MG tablet Take 2 tablets (650 mg total) by mouth every 6 (six) hours as needed for mild pain (pain score 1-3). 02/08/24  Yes Raguel Con RAMAN, PA-C  apixaban  (ELIQUIS ) 5 MG TABS tablet Take 1 tablet (5 mg total) by mouth 2 (two) times daily. 02/10/24  Yes Terra Fairy PARAS, PA-C  carvedilol  (COREG ) 6.25 MG tablet Take 1 tablet (6.25 mg total) by mouth 2 (two) times daily. 11/19/23  Yes Cindie Ole DASEN, MD  Cholecalciferol (VITAMIN D3) 25 MCG (1000 UT) CAPS Take 1,000 Units by mouth at bedtime.   Yes [provider]  clonazePAM  (KLONOPIN ) 0.5 MG tablet TAKE 1 TABLET BY MOUTH ONCE DAILY AS NEEDED FOR ANXIETY/ SLEEP 09/07/19  Yes Stuart Vernell Norris, PA-C  cyanocobalamin (VITAMIN B12) 1000 MCG tablet Take 1,000 mcg by mouth at bedtime.   Yes [provider]  furosemide  (LASIX ) 20 MG tablet Take 20 mg by mouth as needed for fluid. 01/15/24 01/14/25 Yes [provider]  levothyroxine  (SYNTHROID ) 112 MCG tablet Take 1 tablet by mouth once daily 04/28/20  Yes Cannady, Jolene T, NP  lovastatin  (MEVACOR ) 20  MG tablet TAKE 1 TABLET BY MOUTH AT BEDTIME 03/29/20  Yes Johnson, Megan P, DO  oxyCODONE  (OXY IR/ROXICODONE ) 5 MG immediate release tablet Take 1 tablet (5 mg total) by mouth every 6 (six) hours as needed for severe pain (pain score 7-10). 02/08/24  Yes Raguel Con RAMAN, PA-C  potassium chloride  (KLOR-CON  M) 10 MEQ tablet Take 1 tablet (10 mEq total) by mouth daily. Patient taking differently: Take 10 mEq by mouth as needed (take with Lasix ). 03/01/24  Yes Rutha Manuelita HERO, PA-C  spironolactone  (ALDACTONE ) 25 MG tablet Take 12.5 mg by mouth daily.   Yes [provider]     Critical care time: n/a    Rachell Druckenmiller Pleas, MD Pulm CCM Please contact the on call pager at  [phone] for any urgent or emergent needs. 2:32 PM 03/18/2024

## 2024-03-18 NOTE — H&P (View-Only) (Signed)
 Progress Note  Patient Name: [name] [name] Date of Encounter: 03/18/2024  [facility] Cardiologist: None   Patient Profile     Subjective   Less short of breath today.  Weight down 4 pounds from yesterday  She put out 1.7 L yesterday and is net +555 cc since admission  Heart rate stable at 70 bpm Inpatient Medications    Scheduled Meds:  carvedilol   6.25 mg Oral BID WC   Chlorhexidine  Gluconate Cloth  6 each Topical Daily   feeding supplement  237 mL Oral BID BM   furosemide   40 mg Intravenous Daily   levothyroxine   112 mcg Oral QHS   pravastatin   20 mg Oral q1800   spironolactone   12.5 mg Oral Daily   Continuous Infusions:  amiodarone  30 mg/hr (03/17/24 2148)   heparin  1,250 Units/hr (03/17/24 2032)   PRN Meds: acetaminophen , clonazePAM , docusate sodium , mouth rinse, oxyCODONE , polyethylene glycol   Vital Signs    Vitals:   03/17/24 1830 03/17/24 1900 03/17/24 2300 03/18/24 0424  BP: 117/71 104/63 121/65 125/82  Pulse: 79 75 70 70  Resp: 20 20 20 19   Temp: 97.9 F (36.6 C) 98 F (36.7 C) 97.9 F (36.6 C) 98 F (36.7 C)  TempSrc: Oral Oral Oral Oral  SpO2: 93% 93% 92% 94%  Weight:    82.5 kg    Intake/Output Summary (Last 24 hours) at 03/18/2024 9271 Last data filed at 03/18/2024 9293 Gross per 24 hour  Intake 1719.07 ml  Output 1100 ml  Net 619.07 ml      03/18/2024    4:24 AM 03/17/2024    5:00 AM 03/16/2024    9:30 AM  Last 3 Weights  Weight (lbs) 181 lb 14.1 oz 185 lb 10 oz 181 lb 10.5 oz  Weight (kg) 82.5 kg 84.2 kg 82.4 kg      Telemetry    Atrial fibrillation with ventricular paced rhythm- Personally Reviewed  ECG    No new EKG to review- Personally Reviewed  Physical Exam   GEN: Well nourished, well developed in no acute distress HEENT: Normal NECK: No JVD; No carotid bruits LYMPHATICS: No lymphadenopathy CARDIAC:RRR, no murmurs, rubs, gallops RESPIRATORY:  Clear to auscultation without rales, wheezing or rhonchi   ABDOMEN: Soft, non-tender, non-distended MUSCULOSKELETAL:  No edema; No deformity  SKIN: Warm and dry NEUROLOGIC:  Alert and oriented x 3 PSYCHIATRIC:  Normal affect  Labs    High Sensitivity Troponin:   Recent Labs  Lab 03/15/24 0836 03/15/24 1022  TROPONINIHS 24* 21*      Chemistry Recent Labs  Lab 03/15/24 1928 03/16/24 0250 03/16/24 1120 03/17/24 0454  NA 120* 124* 122* 121*  K  --  3.9 4.3 3.9  CL  --  85* 84* 85*  CO2  --  26 24 27   GLUCOSE  --  103* 142* 110*  BUN  --  20 21 18   CREATININE  --  1.10* 1.17* 1.00  CALCIUM   --  8.9 8.9 8.7*  PROT 6.7  --   --   --   GFRNONAA  --  55* 51* >60  ANIONGAP  --  13 14 9      Hematology Recent Labs  Lab 03/15/24 0836 03/16/24 0250 03/17/24 0454  WBC 16.0* 10.7* 10.6*  RBC 4.39 3.98 3.89  HGB 13.7 12.4 12.1  HCT 38.7 34.5* 33.8*  MCV 88.2 86.7 86.9  MCH 31.2 31.2 31.1  MCHC 35.4 35.9 35.8  RDW 13.0 13.1 13.2  PLT 303 258 253    BNP Recent Labs  Lab 03/15/24 0836 03/16/24 1120  BNP 741.8* 502.6*     DDimer No results for input(s): DDIMER in the last 168 hours.   CHA2DS2-VASc Score = 5   This indicates a 7.2% annual risk of stroke. The patient's score is based upon: CHF History: 1 HTN History: 1 Diabetes History: 0 Stroke History: 0 Vascular Disease History: 1 Age Score: 1 Gender Score: 1      Radiology    DG CHEST PORT 1 VIEW Result Date: 03/17/2024 CLINICAL DATA:  Shortness of breath. EXAM: PORTABLE CHEST 1 VIEW COMPARISON:  03/16/2024 FINDINGS: Stable enlarged cardiac silhouette, moderate-sized left pleural effusion and left basilar atelectasis. Clear right lung. Stable mild diffuse peribronchial thickening. Stable left subclavian pacer and AICD leads. Tortuous and partially calcified thoracic aorta. Unremarkable bones. IMPRESSION: 1. Stable moderate-sized left pleural effusion and left basilar atelectasis. 2. Stable cardiomegaly. 3. Stable mild chronic bronchitic changes. Electronically  Signed   By: Elspeth Bathe M.D.   On: 03/17/2024 13:42   DG CHEST PORT 1 VIEW Result Date: 03/16/2024 EXAM: 1 VIEW(S) XRAY OF THE CHEST 03/16/2024 12:24:00 PM COMPARISON: 03/15/2024. CLINICAL HISTORY: Pleural effusion. FINDINGS: LINES, TUBES AND DEVICES: Left AICD remains in place, unchanged. LUNGS AND PLEURA: Small to moderate left pleural effusion with left lower lobe atelectasis or infiltrate, similar to prior study. No pneumothorax. HEART AND MEDIASTINUM: Cardiomegaly. BONES AND SOFT TISSUES: No acute osseous abnormality. IMPRESSION: 1. Small to moderate left pleural effusion with left lower lobe atelectasis or infiltrate, similar to prior study. Electronically signed by: Franky Crease MD 03/16/2024 04:46 PM EST RP Workstation: [id number]   ECHOCARDIOGRAM COMPLETE Result Date: 03/16/2024    ECHOCARDIOGRAM REPORT   Patient Name:   [name] [name] Date of Exam: 03/16/2024 Medical Rec #:  [id number]     Height:       64.0 in Accession #:    [id number]    Weight:       181.7 lb Date of Birth:  Jul 09, 1955     BSA:          1.878 m Patient Age:    68 years      BP:           104/62 mmHg Patient Gender: F             HR:           70 bpm. Exam Location:  Inpatient Procedure: 2D Echo, Intracardiac Opacification Agent, Cardiac Doppler and Color            Doppler (Both Spectral and Color Flow Doppler were utilized during            procedure). Indications:    Congestive Heart Failure I50.9  History:        Patient has prior history of Echocardiogram examinations, most                 recent 10/16/2020. CHF and Cardiomyopathy, Pacemaker, PAD; Risk                 Factors:Hypertension, Dyslipidemia and Former Smoker.  Sonographer:    Koleen Popper RDCS Referring Phys: [id number] TAYLOR A PARCELLS  Sonographer Comments: Suboptimal apical window. IMPRESSIONS  1. Left ventricular ejection fraction, by estimation, is 20 to 25%. The left ventricle has severely decreased function. The left ventricle demonstrates global hypokinesis.  The left ventricular internal cavity size was severely dilated. There is severe eccentric left ventricular hypertrophy. Left  ventricular diastolic parameters are consistent with Grade III diastolic dysfunction (restrictive). There is severe global hypokinesis with apical akinesis and swerlling of contrast concerning for early thrombus formation.  2. Right ventricular systolic function is mildly reduced. The right ventricular size is normal. There is normal pulmonary artery systolic pressure.  3. Left atrial size was mildly dilated.  4. Lead in RA.  5. The mitral valve is normal in structure. No evidence of mitral valve regurgitation.  6. At least moderate TR possibly in setting of lead impingement. Tricuspid valve regurgitation is moderate.  7. The aortic valve is normal in structure. Aortic valve regurgitation is not visualized.  8. The inferior vena cava is dilated in size with <50% respiratory variability, suggesting right atrial pressure of 15 mmHg. Comparison(s): Prior images reviewed side by side. EF is slightly worse and there is apical akinesis with possible early thrombus formation. No pericardial effusion. FINDINGS  Left Ventricle: Left ventricular ejection fraction, by estimation, is 20 to 25%. The left ventricle has severely decreased function. The left ventricle demonstrates global hypokinesis. Definity  contrast agent was given IV to delineate the left ventricular endocardial borders. The left ventricular internal cavity size was severely dilated. There is severe eccentric left ventricular hypertrophy. Left ventricular diastolic parameters are consistent with Grade III diastolic dysfunction (restrictive).  LV Wall Scoring: The apex is akinetic. The entire anterior wall, entire lateral wall, entire septum, and entire inferior wall are hypokinetic. There is severe global hypokinesis with apical akinesis and swerlling of contrast concerning for early thrombus formation. Right Ventricle: The right  ventricular size is normal. No increase in right ventricular wall thickness. Right ventricular systolic function is mildly reduced. There is normal pulmonary artery systolic pressure. The tricuspid regurgitant velocity is 2.14 m/s, and with an assumed right atrial pressure of 8 mmHg, the estimated right ventricular systolic pressure is 26.3 mmHg. Left Atrium: Left atrial size was mildly dilated. Right Atrium: Lead in RA. Right atrial size was normal in size. Pericardium: There is no evidence of pericardial effusion. Mitral Valve: The mitral valve is normal in structure. No evidence of mitral valve regurgitation. Tricuspid Valve: At least moderate TR possibly in setting of lead impingement. The tricuspid valve is normal in structure. Tricuspid valve regurgitation is moderate. Aortic Valve: The aortic valve is normal in structure. Aortic valve regurgitation is not visualized. Pulmonic Valve: The pulmonic valve was not well visualized. Pulmonic valve regurgitation is not visualized. Aorta: The aortic root is normal in size and structure. Venous: The inferior vena cava is dilated in size with less than 50% respiratory variability, suggesting right atrial pressure of 15 mmHg. Additional Comments: A device lead is visualized.  LEFT VENTRICLE PLAX 2D LVIDd:         5.60 cm      Diastology LVIDs:         5.00 cm      LV e' medial:    8.66 cm/s LV PW:         1.20 cm      LV E/e' medial:  10.5 LV IVS:        1.00 cm      LV e' lateral:   4.05 cm/s LVOT diam:     1.70 cm      LV E/e' lateral: 22.4 LV SV:         43 LV SV Index:   23 LVOT Area:     2.27 cm  LV Volumes (MOD) LV vol d, MOD A4C: 223.0 ml LV  vol s, MOD A4C: 141.0 ml LV SV MOD A4C:     223.0 ml RIGHT VENTRICLE            IVC RV S prime:     7.01 cm/s  IVC diam: 2.30 cm TAPSE (M-mode): 1.3 cm LEFT ATRIUM           Index LA diam:      4.30 cm 2.29 cm/m LA Vol (A4C): 65.1 ml 34.66 ml/m  AORTIC VALVE LVOT Vmax:   132.00 cm/s LVOT Vmean:  77.600 cm/s LVOT VTI:     0.188 m  AORTA Ao Root diam: 3.10 cm Ao Asc diam:  3.40 cm MITRAL VALVE               TRICUSPID VALVE MV Area (PHT): 4.49 cm    TR Peak grad:   18.3 mmHg MV Decel Time: 169 msec    TR Vmax:        214.00 cm/s MV E velocity: 90.70 cm/s MV A velocity: 22.00 cm/s  SHUNTS MV E/A ratio:  4.12        Systemic VTI:  0.19 m                            Systemic Diam: 1.70 cm Joelle Azobou Tonleu Electronically signed by Joelle Cedars Tonleu Signature Date/Time: 03/16/2024/12:33:08 PM    Final     Patient Profile     68 y.o. female with a hx of CHB s/p ICD, nonischemic cardiomyopathy, chronic HFrEF, persistent A-Fib s/p recent DCCV 03/07/2024, PVD, aortic and coronary calcifications per CT, hypertension, hypothyrdoism who is being seen 03/15/2024 for the evaluation of CHF at the request of Dr. Harold.   Assessment & Plan    Hyponatremia Left pleural effusion Nonischemic cardiomyopathy Shortness of breath Elevated BNP -Normal coronary arteries by cath 2016  -patient has a known history of nonischemic cardiomyopathy with most recent 2D echo in 2022 showing EF of 30 to 35% with GHK she has not had a 2D echo done since then - Status post attempted CRT-D upgrade by Dr. 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  RV ICD lead was implanted and generator changed out. - Chest x-ray at that time was clear - underwent left thoracotomy for LV epicardial lead placement 1025 and chest x-ray post thoracotomy showed persistent left pleural effusion with retrocardiac opacification of the left lower lobe and follow-up the next day showed retrocardiac left basilar opacity that was felt to be atelectasis or airspace disease. -  Follow-up chest x-ray 02/15/2024 showed improved left basilar airspace opacity with postop atelectasis and mild pulmonary venous congestion.   - Had recurrence of atrial fibrillation on 10/29 and underwent cardioversion on 11/24 - Follow-up  chest x-ray 11/18 showed worsening moderate size left effusion associate with compressive atelectasis in the left base. - Now admitted with 1 week history of worsening shortness of breath, orthopnea but no fever/chills/cough/lower extremity edema/increased abdominal fullness. - BNP mildly elevated  - Found to have a sodium level of 118 and low urine osmolality  - TSH normal 3.12 - Noted to be back in atrial fibrillation - Status post thoracentesis of 1.3 L of yellow appearing fluid -2D echo this admit: EF 20 to 25% with global HK, severe LVE with severe eccentric LVH, G3 DD and possible early forming LV thrombus, mild RV dysfunction and RAP 15 mmHg -Unclear etiology of decline  in EF.  Had normal coronary arteries by cath in 2016.  -Overall she does not appear volume overloaded with no lower extremity edema or abdominal fullness but given worsening LV function and hyponatremia >>will treat for volume overload -BP stable at 125/82 mmHg and O2 sats 96% on room air - Na back down to 121 today: SCr 1: K+ 3.9 yesterday and BMP pending today - Did not tolerate Entresto  due to hypotension - Intolerant to SGLT2i - Currently on Lasix  40 mg IV which we will continue until we get right heart cath dated today - Follow strict I's and O's, daily weights and renal function while diuresing - Plan for pulmonary is once she is diuresed adequately will obtain chest CT to reassess retrocardiac opacity - Continue 1500 cc fluid restriction - Continue carvedilol  6.25 mg twice daily and spironolactone  12.5 mg daily - Will give a trial of low-dose ARB once she has been adequately diuresed   Persistent atrial fibrillation Complete heart block  - Status post PPM in 1990s followed in A-fib clinic - Status post failed CRT-D upgrade in 10/25 with subsequent placement of RV ICD lead with generator change out and then subsequent left thoracotomy with LV epicardial lead placement 02/06/2024. - Noted to be in atrial  fibrillation by device interrogation on 02/10/2024 - Ultimately underwent DCCV 11/24 - back in atrial flutter with V pacing on EKG>>documented on device interrogation this am - EP evaluated  and after discussing treatment options patient has chosen to start amiodarone  with goal towards ablation in early 2026 - Currently on IV Amio drip and will transition to oral prior to discharge -Currently on IV heparin  with Eliquis  on hold for cardiac cath   Hypertension - BP well-controlled - Continue Failor 6.25 mg twice daily and spironolactone  12.5 mg daily  Hyperlipidemia - LDL goal less than 100 - LDL 71 in 08/2023 - Lovastatin  20 mg daily    I spent 35 minutes caring for this patient today face to face, ordering and reviewing labs, reviewing records from chest x-ray 12-25, 2D echo 03/16/2024, seeing the patient, documenting in the record, and arranging for a 2D echo  For questions or updates, please contact [facility] Please consult [email or web address] for contact info under        Signed, Wilbert Bihari, MD  03/18/2024, 7:28 AM

## 2024-03-18 NOTE — Progress Notes (Signed)
 Progress Note    [name] [name]   FMW:[id number]  DOB: 05-19-1955  DOA: 03/15/2024     3 PCP: Sherial Bail, MD  Initial CC: SOB  Hospital Course: Ms. Kittleson is a 68 yo female with PMH afib, CHB s/p PPM, HTN, HLD, sCHF, anxiety, GERD, gout, hypothyroid, PAD s/p iliac stent who initially presented on 12/2 with SOB lasting about 4 to 5 days with progressive worsening. There was report of med compliance but increase in water  intake prior to admission.  On workup she was found to have hyponatremia, 119.  Initially admitted to the ICU. Cardiology and EP were consulted as well. She was started on lasix  and underwent further workup.   Interval History:  Underwent heart cath this morning.  Seen in her room after returning.  Feels same as yesterday with no significant dyspnea when ambulating but has only done short distances.  Assessment and Plan: * Hyponatremia - Sodium 119 on admission.  Previously normal in November - etiology on admission was due to CHF - for now is being continued on lasix  and aldactone , esp given echo findings - continue trending Na; she is asymptomatic  - Na 122, stable this am  Acute on chronic combined systolic and diastolic CHF (congestive heart failure) (HCC) - Echo performed 03/16/2024: EF 20 to 25%, global hypokinesis, severe LVH, grade 3 DD.  Apical akinesis and swirling of contrast concerning for early thrombus formation - s/p PPM historically and recent left anterolateral thoracotomy for insertion of 2 left ventricular epicardial pacing leads and revision of BiV pacing system on 02/05/2024 - Cardiology following closely - Remains on diuresis with Lasix  - on aldactone  as well  - s/p R/L heart cath on 12/5; no significant disease; elevated filling pressures including RA, severe biventricular failure, moderate pulmonary hypertension  Recurrent left pleural effusion - left effusion; appears started after thoracotomy on 02/05/2024 but has improved some with  diuresis since then however recurring -Underwent left thoracentesis in the ICU on 03/15/2024 with 1.3 L fluid drained - exudative by protein; transudative by LDH - ongoing diuresis being attempted for treatment - given unilateral, differential includes infectious vs CHF vs ?complication from thoracotomy since new onset after procedure (had chest tube 10/24>>10/26) - add on to prior thoracentesis fluid: pH (in process), cell count (505 total nuc cells), and culture (no organisms)  Persistent atrial fibrillation (HCC) - remains on amio drip - Heparin  drip being converted back to Eliquis  on 03/18/2024 given no further procedures - continue coreg   Atrial flutter (HCC) - per EP: Amiodarone  400mg  BID for 1 week Then > 400mg  daily for 1 week Then > 200mg  diaily EP follow up will be arranged for ~ 3 weeks to reassess rhythm plan for DCCV if needed ~ Jan-Feb with Dr. Kennyth to look towards timing of ablation   AKI (acute kidney injury) - baseline creatinine ~ 0.8 - 0.9 - patient presents with increase in creat >0.3 mg/dL above baseline or creat increase >1.5x baseline presumed to have occurred within past 7 days PTA - creat 1.25 on admission - presumed CRS - stable and improved on diuresis for now  Hypothyroidism - Continue Synthroid   Heart block AV complete (HCC) - see CHF; now has PPM and ICD  Pure hypercholesterolemia - Continue pravastatin   Primary hypertension - soft pressures - okay for coreg  for now; will reduce or hold if necessary    Antimicrobials:   DVT prophylaxis:  SCD's Start: 03/18/24 0902 apixaban  (ELIQUIS ) tablet 5 mg   Code Status:  Code Status: Full Code  Mobility Assessment (Last 72 Hours)     Mobility Assessment     Row Name 03/18/24 0800 03/17/24 2042 03/17/24 0830 03/16/24 2000 03/16/24 0800   Does the patient have exclusion criteria? No- Perform mobility assessment No- Perform mobility assessment No- Perform mobility assessment No- Perform  mobility assessment No- Perform mobility assessment   What is the highest level of mobility based on the mobility assessment? Level 4 (Ambulates with assistance) - Balance while stepping forward/back - Complete Level 4 (Ambulates with assistance) - Balance while stepping forward/back - Complete Level 5 (Ambulates independently) - Balance while walking independently - Complete Level 5 (Ambulates independently) - Balance while walking independently - Complete Level 5 (Ambulates independently) - Balance while walking independently - Complete    Row Name 03/15/24 1945           Does the patient have exclusion criteria? No- Perform mobility assessment       What is the highest level of mobility based on the mobility assessment? Level 4 (Ambulates with assistance) - Balance while stepping forward/back - Complete          Diet: Diet Orders (From admission, onward)     Start     Ordered   03/18/24 0902  Diet Heart Room service appropriate? Yes; Fluid consistency: Thin  Diet effective now       Comments: 2 g sodium restriction  Question Answer Comment  Room service appropriate? Yes   Fluid consistency: Thin      03/18/24 0901            Barriers to discharge: None Disposition Plan: TBD HH orders placed: TBD Status is: Inpatient  Objective: Blood pressure 120/64, pulse 70, temperature 97.7 F (36.5 C), temperature source Oral, resp. rate 19, weight 82.5 kg, SpO2 93%.  Examination:  Physical Exam Constitutional:      Appearance: Normal appearance.  HENT:     Head: Normocephalic and atraumatic.     Mouth/Throat:     Mouth: Mucous membranes are moist.  Eyes:     Extraocular Movements: Extraocular movements intact.  Cardiovascular:     Rate and Rhythm: Normal rate and regular rhythm.  Pulmonary:     Effort: Pulmonary effort is normal. No respiratory distress.     Breath sounds: Examination of the left-middle field reveals decreased breath sounds. Examination of the left-lower  field reveals decreased breath sounds. Decreased breath sounds present. No wheezing.  Abdominal:     General: Bowel sounds are normal. There is no distension.     Palpations: Abdomen is soft.     Tenderness: There is no abdominal tenderness.  Musculoskeletal:        General: Normal range of motion.     Cervical back: Normal range of motion and neck supple.     Right lower leg: No edema.     Left lower leg: No edema.  Skin:    General: Skin is warm and dry.  Neurological:     General: No focal deficit present.     Mental Status: She is alert.  Psychiatric:        Mood and Affect: Mood normal.      Consultants:  Cardiology EP Pulmonology  Procedures:  03/15/2024: Left thoracentesis 03/18/2024: Left and right heart cath  Data Reviewed: Results for orders placed or performed during the hospital encounter of 03/15/24 (from the past 24 hours)  Body fluid culture w Gram Stain     Status: None (Preliminary result)  Collection Time: 03/17/24  4:17 PM   Specimen: Pleura; Body Fluid  Result Value Ref Range   Specimen Description PLEURAL    Special Requests NONE    Gram Stain NO WBC SEEN NO ORGANISMS SEEN     Culture      NO GROWTH < 24 HOURS Performed at [facility], [address], [place], [place] [address]    Report Status PENDING   Heparin  level (unfractionated)     Status: Abnormal   Collection Time: 03/17/24  6:09 PM  Result Value Ref Range   Heparin  Unfractionated >1.10 (H) 0.30 - 0.70 IU/mL  APTT     Status: Abnormal   Collection Time: 03/17/24  6:09 PM  Result Value Ref Range   aPTT 41 (H) 24 - 36 seconds  I-STAT 7, (LYTES, BLD GAS, ICA, H+H)     Status: Abnormal   Collection Time: 03/18/24  8:05 AM  Result Value Ref Range   pH, Arterial 7.473 (H) 7.35 - 7.45   pCO2 arterial 36.8 32 - 48 mmHg   pO2, Arterial 62 (L) 83 - 108 mmHg   Bicarbonate 26.9 20.0 - 28.0 mmol/L   TCO2 28 22 - 32 mmol/L   O2 Saturation 93 %   Acid-Base Excess 3.0 (H) 0.0 - 2.0  mmol/L   Sodium 122 (L) 135 - 145 mmol/L   Potassium 3.8 3.5 - 5.1 mmol/L   Calcium , Ion 1.18 1.15 - 1.40 mmol/L   HCT 35.0 (L) 36.0 - 46.0 %   Hemoglobin 11.9 (L) 12.0 - 15.0 g/dL   Collection site art line    Drawn by Nurse    Sample type ARTERIAL   POCT I-Stat EG7     Status: Abnormal   Collection Time: 03/18/24  8:05 AM  Result Value Ref Range   pH, Ven 7.446 (H) 7.25 - 7.43   pCO2, Ven 40.4 (L) 44 - 60 mmHg   pO2, Ven 31 (LL) 32 - 45 mmHg   Bicarbonate 27.9 20.0 - 28.0 mmol/L   TCO2 29 22 - 32 mmol/L   O2 Saturation 62 %   Acid-Base Excess 3.0 (H) 0.0 - 2.0 mmol/L   Sodium 124 (L) 135 - 145 mmol/L   Potassium 3.6 3.5 - 5.1 mmol/L   Calcium , Ion 1.14 (L) 1.15 - 1.40 mmol/L   HCT 35.0 (L) 36.0 - 46.0 %   Hemoglobin 11.9 (L) 12.0 - 15.0 g/dL   Collection site Web Designer by Nurse    Sample type VENOUS    Comment NOTIFIED PHYSICIAN   POCT I-Stat EG7     Status: Abnormal   Collection Time: 03/18/24  8:09 AM  Result Value Ref Range   pH, Ven 7.458 (H) 7.25 - 7.43   pCO2, Ven 41.2 (L) 44 - 60 mmHg   pO2, Ven 33 32 - 45 mmHg   Bicarbonate 29.2 (H) 20.0 - 28.0 mmol/L   TCO2 30 22 - 32 mmol/L   O2 Saturation 66 %   Acid-Base Excess 5.0 (H) 0.0 - 2.0 mmol/L   Sodium 122 (L) 135 - 145 mmol/L   Potassium 3.8 3.5 - 5.1 mmol/L   Calcium , Ion 1.20 1.15 - 1.40 mmol/L   HCT 35.0 (L) 36.0 - 46.0 %   Hemoglobin 11.9 (L) 12.0 - 15.0 g/dL   Collection site art line    Drawn by Nurse    Sample type VENOUS    Comment NOTIFIED PHYSICIAN   Basic metabolic panel with  GFR     Status: Abnormal   Collection Time: 03/18/24  9:17 AM  Result Value Ref Range   Sodium 122 (L) 135 - 145 mmol/L   Potassium 3.8 3.5 - 5.1 mmol/L   Chloride 88 (L) 98 - 111 mmol/L   CO2 27 22 - 32 mmol/L   Glucose, Bld 205 (H) 70 - 99 mg/dL   BUN 16 8 - 23 mg/dL   Creatinine, Ser 9.22 0.44 - 1.00 mg/dL   Calcium  9.0 8.9 - 10.3 mg/dL   GFR, Estimated >39 >39 mL/min   Anion gap 7 5 - 15  CBC with  Differential/Platelet     Status: Abnormal   Collection Time: 03/18/24  9:17 AM  Result Value Ref Range   WBC 8.5 4.0 - 10.5 K/uL   RBC 3.85 (L) 3.87 - 5.11 MIL/uL   Hemoglobin 11.8 (L) 12.0 - 15.0 g/dL   HCT 66.8 (L) 63.9 - 53.9 %   MCV 86.0 80.0 - 100.0 fL   MCH 30.6 26.0 - 34.0 pg   MCHC 35.6 30.0 - 36.0 g/dL   RDW 87.0 88.4 - 84.4 %   Platelets 250 150 - 400 K/uL   nRBC 0.0 0.0 - 0.2 %   Neutrophils Relative % 88 %   Neutro Abs 7.5 1.7 - 7.7 K/uL   Lymphocytes Relative 7 %   Lymphs Abs 0.6 (L) 0.7 - 4.0 K/uL   Monocytes Relative 4 %   Monocytes Absolute 0.3 0.1 - 1.0 K/uL   Eosinophils Relative 0 %   Eosinophils Absolute 0.0 0.0 - 0.5 K/uL   Basophils Relative 0 %   Basophils Absolute 0.0 0.0 - 0.1 K/uL   Immature Granulocytes 1 %   Abs Immature Granulocytes 0.05 0.00 - 0.07 K/uL  Magnesium      Status: None   Collection Time: 03/18/24  9:17 AM  Result Value Ref Range   Magnesium  2.0 1.7 - 2.4 mg/dL  Heparin  level (unfractionated)     Status: Abnormal   Collection Time: 03/18/24  9:17 AM  Result Value Ref Range   Heparin  Unfractionated >1.10 (H) 0.30 - 0.70 IU/mL  APTT     Status: Abnormal   Collection Time: 03/18/24  9:17 AM  Result Value Ref Range   aPTT 94 (H) 24 - 36 seconds    I have reviewed pertinent nursing notes, vitals, labs, and images as necessary. I have ordered labwork to follow up on as indicated.  I have reviewed the last notes from staff over past 24 hours. I have discussed patient's care plan and test results with nursing staff, CM/SW, and other staff as appropriate.  Old records reviewed in assessment of this patient  Time spent: Greater than 50% of the 55 minute visit was spent in counseling/coordination of care for the patient as laid out in the A&P.   LOS: 3 days   Alm Apo, MD [facility] 03/18/2024, 2:34 PM

## 2024-03-19 DIAGNOSIS — I5043 Acute on chronic combined systolic (congestive) and diastolic (congestive) heart failure: Secondary | ICD-10-CM | POA: Diagnosis not present

## 2024-03-19 DIAGNOSIS — E871 Hypo-osmolality and hyponatremia: Secondary | ICD-10-CM | POA: Diagnosis not present

## 2024-03-19 LAB — CBC WITH DIFFERENTIAL/PLATELET
Abs Immature Granulocytes: 0.07 K/uL (ref 0.00–0.07)
Basophils Absolute: 0 K/uL (ref 0.0–0.1)
Basophils Relative: 0 %
Eosinophils Absolute: 0 K/uL (ref 0.0–0.5)
Eosinophils Relative: 0 %
HCT: 33.8 % — ABNORMAL LOW (ref 36.0–46.0)
Hemoglobin: 11.9 g/dL — ABNORMAL LOW (ref 12.0–15.0)
Immature Granulocytes: 1 %
Lymphocytes Relative: 8 %
Lymphs Abs: 0.9 K/uL (ref 0.7–4.0)
MCH: 30.9 pg (ref 26.0–34.0)
MCHC: 35.2 g/dL (ref 30.0–36.0)
MCV: 87.8 fL (ref 80.0–100.0)
Monocytes Absolute: 1.1 K/uL — ABNORMAL HIGH (ref 0.1–1.0)
Monocytes Relative: 9 %
Neutro Abs: 10.4 K/uL — ABNORMAL HIGH (ref 1.7–7.7)
Neutrophils Relative %: 82 %
Platelets: 275 K/uL (ref 150–400)
RBC: 3.85 MIL/uL — ABNORMAL LOW (ref 3.87–5.11)
RDW: 13 % (ref 11.5–15.5)
WBC: 12.5 K/uL — ABNORMAL HIGH (ref 4.0–10.5)
nRBC: 0 % (ref 0.0–0.2)

## 2024-03-19 LAB — BASIC METABOLIC PANEL WITH GFR
Anion gap: 14 (ref 5–15)
BUN: 21 mg/dL (ref 8–23)
CO2: 24 mmol/L (ref 22–32)
Calcium: 9.2 mg/dL (ref 8.9–10.3)
Chloride: 85 mmol/L — ABNORMAL LOW (ref 98–111)
Creatinine, Ser: 0.9 mg/dL (ref 0.44–1.00)
GFR, Estimated: >60 mL/min (ref >60)
Glucose, Bld: 289 mg/dL — ABNORMAL HIGH (ref 70–99)
Potassium: 3.5 mmol/L (ref 3.5–5.1)
Sodium: 123 mmol/L — ABNORMAL LOW (ref 135–145)

## 2024-03-19 LAB — HEMOGLOBIN A1C
Hgb A1c MFr Bld: 5.2 % (ref 4.8–5.6)
Mean Plasma Glucose: 102.54 mg/dL

## 2024-03-19 LAB — GLUCOSE, CAPILLARY
Glucose-Capillary: 105 mg/dL — ABNORMAL HIGH (ref 70–99)
Glucose-Capillary: 112 mg/dL — ABNORMAL HIGH (ref 70–99)
Glucose-Capillary: 108 mg/dL — ABNORMAL HIGH (ref 70–99)

## 2024-03-19 LAB — MAGNESIUM: Magnesium: 2 mg/dL (ref 1.7–2.4)

## 2024-03-19 MED ORDER — POTASSIUM CHLORIDE CRYS ER 20 MEQ PO TBCR
40.0000 meq | EXTENDED_RELEASE_TABLET | Freq: Once | ORAL | Status: AC
  Administered 2024-03-19: 40 meq via ORAL
  Filled 2024-03-19: qty 2

## 2024-03-19 MED ORDER — FUROSEMIDE 10 MG/ML IJ SOLN
40.0000 mg | Freq: Two times a day (BID) | INTRAMUSCULAR | Status: DC
  Administered 2024-03-19 – 2024-03-21 (×5): 40 mg via INTRAVENOUS
  Filled 2024-03-19 (×6): qty 4

## 2024-03-19 MED ORDER — AMIODARONE HCL 200 MG PO TABS
200.0000 mg | ORAL_TABLET | Freq: Two times a day (BID) | ORAL | Status: DC
  Administered 2024-03-19 – 2024-03-22 (×7): 200 mg via ORAL
  Filled 2024-03-19 (×7): qty 1

## 2024-03-19 MED ORDER — INSULIN ASPART 100 UNIT/ML IJ SOLN: 0.0000 [IU] | Freq: Three times a day (TID) | INTRAMUSCULAR | Status: DC

## 2024-03-19 MED ORDER — SPIRONOLACTONE 25 MG PO TABS
25.0000 mg | ORAL_TABLET | Freq: Every day | ORAL | Status: DC
  Administered 2024-03-20 – 2024-03-22 (×3): 25 mg via ORAL
  Filled 2024-03-19 (×3): qty 1

## 2024-03-19 NOTE — Plan of Care (Signed)
   Problem: Health Behavior/Discharge Planning: Goal: Ability to manage health-related needs will improve Outcome: Progressing   Problem: Clinical Measurements: Goal: Ability to maintain clinical measurements within normal limits will improve Outcome: Progressing   Problem: Clinical Measurements: Goal: Will remain free from infection Outcome: Progressing

## 2024-03-19 NOTE — Progress Notes (Signed)
 Progress Note    [name] [name]   FMW:[id number]  DOB: May 03, 1955  DOA: 03/15/2024     4 PCP: Sherial Bail, MD  Initial CC: SOB  Hospital Course: [name] [name] is a 68 yo female with PMH afib, CHB s/p PPM, HTN, HLD, sCHF, anxiety, GERD, gout, hypothyroid, PAD s/p iliac stent who initially presented on 12/2 with SOB lasting about 4 to 5 days with progressive worsening. There was report of med compliance but increase in water  intake prior to admission.  On workup she was found to have hyponatremia, 119.  Initially admitted to the ICU. Cardiology and EP were consulted as well. She was started on lasix  and underwent further workup.   Interval History:  No events overnight.  Breathing continues to improve each day.  Energy also seems better daily. No big concerns this morning.  Assessment and Plan: * Hyponatremia syndrome - Sodium 119 on admission.  Previously normal in November - etiology on admission was due to CHF - for now is being continued on lasix  and aldactone , esp given echo findings - continue trending Na; she is asymptomatic  - Na 123, stable this am  Acute on chronic combined systolic and diastolic CHF (congestive heart failure) (HCC) - Echo performed 03/16/2024: EF 20 to 25%, global hypokinesis, severe LVH, grade 3 DD.  Apical akinesis and swirling of contrast concerning for early thrombus formation - s/p PPM historically and recent left anterolateral thoracotomy for insertion of 2 left ventricular epicardial pacing leads and revision of BiV pacing system on 02/05/2024 - Cardiology following closely - Remains on diuresis with Lasix  - on aldactone  as well  - s/p R/L heart cath on 12/5; no significant disease; elevated filling pressures including RA, severe biventricular failure, moderate pulmonary hypertension -ACHF to see 12/7  Recurrent left pleural effusion - left effusion; appears started after thoracotomy on 02/05/2024 but has improved some with diuresis since  then however recurring -Underwent left thoracentesis in the ICU on 03/15/2024 with 1.3 L fluid drained - exudative by protein; transudative by LDH - ongoing diuresis being attempted for treatment - given unilateral, differential includes infectious vs CHF vs ?complication from thoracotomy since new onset after procedure (had chest tube 10/24>>10/26) - add on to prior thoracentesis fluid: pH (in process), cell count (505 total nuc cells), and culture (no organisms)  Persistent atrial fibrillation (HCC) - remains on amio drip - Heparin  drip being converted back to Eliquis  on 03/18/2024 given no further procedures - continue coreg   Atrial flutter (HCC) - per EP: Amiodarone  400mg  BID for 1 week Then > 400mg  daily for 1 week Then > 200mg  diaily EP follow up will be arranged for ~ 3 weeks to reassess rhythm plan for DCCV if needed ~ Jan-Feb with Dr. Kennyth to look towards timing of ablation   AKI (acute kidney injury) - baseline creatinine ~ 0.8 - 0.9 - patient presents with increase in creat >0.3 mg/dL above baseline or creat increase >1.5x baseline presumed to have occurred within past 7 days PTA - creat 1.25 on admission - presumed CRS - stable and improved on diuresis for now  Hypothyroidism - Continue Synthroid   Heart block AV complete (HCC) - see CHF; now has PPM and ICD  Pure hypercholesterolemia - Continue pravastatin   Primary hypertension - soft pressures - okay for coreg  for now; will reduce or hold if necessary    Antimicrobials:   DVT prophylaxis:  SCD's Start: 03/18/24 0902 apixaban  (ELIQUIS ) tablet 5 mg   Code Status:   Code  Status: Full Code  Mobility Assessment (Last 72 Hours)     Mobility Assessment     Row Name 03/19/24 0800 03/18/24 2015 03/18/24 0800 03/17/24 2042 03/17/24 0830   Does the patient have exclusion criteria? No- Perform mobility assessment No- Perform mobility assessment No- Perform mobility assessment No- Perform mobility assessment  No- Perform mobility assessment   What is the highest level of mobility based on the mobility assessment? Level 4 (Ambulates with assistance) - Balance while stepping forward/back - Complete Level 4 (Ambulates with assistance) - Balance while stepping forward/back - Complete Level 4 (Ambulates with assistance) - Balance while stepping forward/back - Complete Level 4 (Ambulates with assistance) - Balance while stepping forward/back - Complete Level 5 (Ambulates independently) - Balance while walking independently - Complete    Row Name 03/16/24 2000           Does the patient have exclusion criteria? No- Perform mobility assessment       What is the highest level of mobility based on the mobility assessment? Level 5 (Ambulates independently) - Balance while walking independently - Complete          Diet: Diet Orders (From admission, onward)     Start     Ordered   03/18/24 0902  Diet Heart Room service appropriate? Yes; Fluid consistency: Thin  Diet effective now       Comments: 2 g sodium restriction  Question Answer Comment  Room service appropriate? Yes   Fluid consistency: Thin      03/18/24 0901            Barriers to discharge: None Disposition Plan: TBD HH orders placed: TBD Status is: Inpatient  Objective: Blood pressure 115/73, pulse 70, temperature 97.8 F (36.6 C), temperature source Oral, resp. rate 16, weight 82.6 kg, SpO2 95%.  Examination:  Physical Exam Constitutional:      Appearance: Normal appearance.  HENT:     Head: Normocephalic and atraumatic.     Mouth/Throat:     Mouth: Mucous membranes are moist.  Eyes:     Extraocular Movements: Extraocular movements intact.  Cardiovascular:     Rate and Rhythm: Normal rate and regular rhythm.  Pulmonary:     Effort: Pulmonary effort is normal. No respiratory distress.     Breath sounds: No decreased breath sounds or wheezing.  Abdominal:     General: Bowel sounds are normal. There is no distension.      Palpations: Abdomen is soft.     Tenderness: There is no abdominal tenderness.  Musculoskeletal:        General: Normal range of motion.     Cervical back: Normal range of motion and neck supple.     Right lower leg: No edema.     Left lower leg: No edema.  Skin:    General: Skin is warm and dry.  Neurological:     General: No focal deficit present.     Mental Status: She is alert.  Psychiatric:        Mood and Affect: Mood normal.      Consultants:  Cardiology EP Pulmonology  Procedures:  03/15/2024: Left thoracentesis 03/18/2024: Left and right heart cath  Data Reviewed: Results for orders placed or performed during the hospital encounter of 03/15/24 (from the past 24 hours)  Basic metabolic panel with GFR     Status: Abnormal   Collection Time: 03/19/24  1:55 AM  Result Value Ref Range   Sodium 123 (L) 135 - 145  mmol/L   Potassium 3.5 3.5 - 5.1 mmol/L   Chloride 85 (L) 98 - 111 mmol/L   CO2 24 22 - 32 mmol/L   Glucose, Bld 289 (H) 70 - 99 mg/dL   BUN 21 8 - 23 mg/dL   Creatinine, Ser 9.09 0.44 - 1.00 mg/dL   Calcium  9.2 8.9 - 10.3 mg/dL   GFR, Estimated >39 >39 mL/min   Anion gap 14 5 - 15  CBC with Differential/Platelet     Status: Abnormal   Collection Time: 03/19/24  1:55 AM  Result Value Ref Range   WBC 12.5 (H) 4.0 - 10.5 K/uL   RBC 3.85 (L) 3.87 - 5.11 MIL/uL   Hemoglobin 11.9 (L) 12.0 - 15.0 g/dL   HCT 66.1 (L) 63.9 - 53.9 %   MCV 87.8 80.0 - 100.0 fL   MCH 30.9 26.0 - 34.0 pg   MCHC 35.2 30.0 - 36.0 g/dL   RDW 86.9 88.4 - 84.4 %   Platelets 275 150 - 400 K/uL   nRBC 0.0 0.0 - 0.2 %   Neutrophils Relative % 82 %   Neutro Abs 10.4 (H) 1.7 - 7.7 K/uL   Lymphocytes Relative 8 %   Lymphs Abs 0.9 0.7 - 4.0 K/uL   Monocytes Relative 9 %   Monocytes Absolute 1.1 (H) 0.1 - 1.0 K/uL   Eosinophils Relative 0 %   Eosinophils Absolute 0.0 0.0 - 0.5 K/uL   Basophils Relative 0 %   Basophils Absolute 0.0 0.0 - 0.1 K/uL   Immature Granulocytes 1 %   Abs  Immature Granulocytes 0.07 0.00 - 0.07 K/uL  Magnesium      Status: None   Collection Time: 03/19/24  1:55 AM  Result Value Ref Range   Magnesium  2.0 1.7 - 2.4 mg/dL  Hemoglobin J8r     Status: None   Collection Time: 03/19/24  8:58 AM  Result Value Ref Range   Hgb A1c MFr Bld 5.2 4.8 - 5.6 %   Mean Plasma Glucose 102.54 mg/dL  Glucose, capillary     Status: Abnormal   Collection Time: 03/19/24 11:08 AM  Result Value Ref Range   Glucose-Capillary 105 (H) 70 - 99 mg/dL    I have reviewed pertinent nursing notes, vitals, labs, and images as necessary. I have ordered labwork to follow up on as indicated.  I have reviewed the last notes from staff over past 24 hours. I have discussed patient's care plan and test results with nursing staff, CM/SW, and other staff as appropriate.  Old records reviewed in assessment of this patient  Time spent: Greater than 50% of the 55 minute visit was spent in counseling/coordination of care for the patient as laid out in the A&P.   LOS: 4 days   Alm Apo, MD [facility] 03/19/2024, 12:18 PM

## 2024-03-19 NOTE — Plan of Care (Signed)
  Problem: Clinical Measurements: Goal: Will remain free from infection Outcome: Progressing Goal: Diagnostic test results will improve Outcome: Progressing Goal: Respiratory complications will improve Outcome: Progressing Goal: Cardiovascular complication will be avoided Outcome: Progressing   Problem: Coping: Goal: Level of anxiety will decrease Outcome: Progressing   Problem: Pain Managment: Goal: General experience of comfort will improve and/or be controlled Outcome: Progressing   Problem: Safety: Goal: Ability to remain free from injury will improve Outcome: Progressing

## 2024-03-19 NOTE — Progress Notes (Addendum)
 Progress Note  Patient Name: [name] [name] Date of Encounter: 03/19/2024  [facility] Cardiologist: None   Patient Profile     Subjective   Shortness of breath continues to improve  She put out 1.95 L yesterday and is net negative 275 cc since admission  No significant change in weight since admission Inpatient Medications    Scheduled Meds:  apixaban   5 mg Oral BID   carvedilol   6.25 mg Oral BID WC   Chlorhexidine  Gluconate Cloth  6 each Topical Daily   feeding supplement  237 mL Oral BID BM   furosemide   40 mg Intravenous Daily   levothyroxine   112 mcg Oral QHS   pravastatin   20 mg Oral q1800   sodium chloride  flush  3 mL Intravenous Q12H   spironolactone   12.5 mg Oral Daily   Continuous Infusions:  sodium chloride      amiodarone  30 mg/hr (03/18/24 2016)   PRN Meds: sodium chloride , acetaminophen , clonazePAM , docusate sodium , ondansetron  (ZOFRAN ) IV, mouth rinse, oxyCODONE , polyethylene glycol, sodium chloride  flush   Vital Signs    Vitals:   03/18/24 1500 03/18/24 1925 03/19/24 0037 03/19/24 0357  BP: (!) 113/55 118/71 (!) 103/53 128/73  Pulse: 70 70 72 70  Resp: 19 15 19 18   Temp: 97.8 F (36.6 C) 98.1 F (36.7 C) 98.1 F (36.7 C) 97.8 F (36.6 C)  TempSrc: Oral Oral Oral Oral  SpO2: 94% 95% 95%   Weight:    82.6 kg    Intake/Output Summary (Last 24 hours) at 03/19/2024 0740 Last data filed at 03/19/2024 0400 Gross per 24 hour  Intake 919.5 ml  Output 1750 ml  Net -830.5 ml      03/19/2024    3:57 AM 03/18/2024    4:24 AM 03/17/2024    5:00 AM  Last 3 Weights  Weight (lbs) 182 lb 181 lb 14.1 oz 185 lb 10 oz  Weight (kg) 82.555 kg 82.5 kg 84.2 kg      Telemetry    Atrial fibrillation with V paced rhythm- Personally Reviewed  ECG    No new EKG to review- Personally Reviewed  Physical Exam   GEN: Well nourished, well developed in no acute distress HEENT: Normal NECK: No JVD; No carotid bruits LYMPHATICS: No  lymphadenopathy CARDIAC:RRR, no murmurs, rubs, gallops RESPIRATORY:  Clear to auscultation without rales, wheezing or rhonchi  ABDOMEN: Soft, non-tender, non-distended MUSCULOSKELETAL:  No edema; No deformity  SKIN: Warm and dry NEUROLOGIC:  Alert and oriented x 3 PSYCHIATRIC:  Normal affect   Labs    High Sensitivity Troponin:   Recent Labs  Lab 03/15/24 0836 03/15/24 1022  TROPONINIHS 24* 21*      Chemistry Recent Labs  Lab 03/15/24 1928 03/16/24 0250 03/17/24 0454 03/18/24 0805 03/18/24 0809 03/18/24 0917 03/19/24 0155  NA 120*   < > 121*   < > 122* 122* 123*  K  --    < > 3.9   < > 3.8 3.8 3.5  CL  --    < > 85*  --   --  88* 85*  CO2  --    < > 27  --   --  27 24  GLUCOSE  --    < > 110*  --   --  205* 289*  BUN  --    < > 18  --   --  16 21  CREATININE  --    < > 1.00  --   --  0.77 0.90  CALCIUM   --    < > 8.7*  --   --  9.0 9.2  PROT 6.7  --   --   --   --   --   --   GFRNONAA  --    < > >60  --   --  >60 >60  ANIONGAP  --    < > 9  --   --  7 14   < > = values in this interval not displayed.     Hematology Recent Labs  Lab 03/17/24 0454 03/18/24 0805 03/18/24 0809 03/18/24 0917 03/19/24 0155  WBC 10.6*  --   --  8.5 12.5*  RBC 3.89  --   --  3.85* 3.85*  HGB 12.1   < > 11.9* 11.8* 11.9*  HCT 33.8*   < > 35.0* 33.1* 33.8*  MCV 86.9  --   --  86.0 87.8  MCH 31.1  --   --  30.6 30.9  MCHC 35.8  --   --  35.6 35.2  RDW 13.2  --   --  12.9 13.0  PLT 253  --   --  250 275   < > = values in this interval not displayed.    BNP Recent Labs  Lab 03/15/24 0836 03/16/24 1120  BNP 741.8* 502.6*     DDimer No results for input(s): DDIMER in the last 168 hours.   CHA2DS2-VASc Score = 5   This indicates a 7.2% annual risk of stroke. The patient's score is based upon: CHF History: 1 HTN History: 1 Diabetes History: 0 Stroke History: 0 Vascular Disease History: 1 Age Score: 1 Gender Score: 1      Radiology    CARDIAC  CATHETERIZATION Result Date: 03/18/2024 Coronary angiography 03/18/2024: LM: Normal LAD: Normal, no significant disease Lcx: Normal, no significant disease RCA: Dominant. Normal, no significant disease LVEDP 23 mmHg Right heart catheterization 03/18/2024: RA: 16 mmHg RV: 45/6 mmHg PA: 43/26 mmHg, mPAP 33 mmHg PCW: could not ob obtained PAPi 1.0 AO sats: 93% PA sats: 64% CO: 5.2 L/min CI: 2.8 L/min/m2 Conclusion: Non coronary artery disease Elevated filling pressures, including RA Severe biventricular failure Decompensated nonischemic cardiomyopathy Moderate pulmonary hypertension. WHOP Grp II Recommendation: Continue diuresis and GDNT for HFrEF Okay to resume Eliquis  03/18/2024 morning, unless any other procedures planned [name] JINNY Lawrence, MD   DG CHEST PORT 1 VIEW Result Date: 03/17/2024 CLINICAL DATA:  Shortness of breath. EXAM: PORTABLE CHEST 1 VIEW COMPARISON:  03/16/2024 FINDINGS: Stable enlarged cardiac silhouette, moderate-sized left pleural effusion and left basilar atelectasis. Clear right lung. Stable mild diffuse peribronchial thickening. Stable left subclavian pacer and AICD leads. Tortuous and partially calcified thoracic aorta. Unremarkable bones. IMPRESSION: 1. Stable moderate-sized left pleural effusion and left basilar atelectasis. 2. Stable cardiomegaly. 3. Stable mild chronic bronchitic changes. Electronically Signed   By: Elspeth Bathe M.D.   On: 03/17/2024 13:42    Patient Profile     68 y.o. female with a hx of CHB s/p ICD, nonischemic cardiomyopathy, chronic HFrEF, persistent A-Fib s/p recent DCCV 03/07/2024, PVD, aortic and coronary calcifications per CT, hypertension, hypothyrdoism who is being seen 03/15/2024 for the evaluation of CHF at the request of Dr. Harold.   Assessment & Plan    Hyponatremia Left pleural effusion Nonischemic cardiomyopathy Shortness of breath Elevated BNP Moderate pulmonary hypertension -Normal coronary arteries by cath 2016  -patient has a known  history of nonischemic cardiomyopathy with most recent 2D echo in  2022 showing EF of 30 to 35% with GHK she has not had a 2D echo done since then - Status post attempted CRT-D upgrade by Dr. 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  RV ICD lead was implanted and generator changed out. - Chest x-ray at that time was clear - underwent left thoracotomy for LV epicardial lead placement 1025 and chest x-ray post thoracotomy showed persistent left pleural effusion with retrocardiac opacification of the left lower lobe and follow-up the next day showed retrocardiac left basilar opacity that was felt to be atelectasis or airspace disease. -  Follow-up chest x-ray 02/15/2024 showed improved left basilar airspace opacity with postop atelectasis and mild pulmonary venous congestion.   - Had recurrence of atrial fibrillation on 10/29 and underwent cardioversion on 11/24 - Follow-up chest x-ray 11/18 showed worsening moderate size left effusion associate with compressive atelectasis in the left base. - Now admitted with 1 week history of worsening shortness of breath, orthopnea but no fever/chills/cough/lower extremity edema/increased abdominal fullness. - BNP mildly elevated  - Found to have a sodium level of 118 and low urine osmolality  - TSH normal 3.12 - Noted to be back in atrial fibrillation - Status post thoracentesis of 1.3 L of yellow appearing fluid - 2D echo this admit: EF 20 to 25% with global HK, severe LVE with severe eccentric LVH, G3 DD and possible early forming LV thrombus, mild RV dysfunction and RAP 15 mmHg - Cath 12/5 with normal coronary arteries and elevated filling pressures including RA consistent with severe biventricular failure with decompensated nonischemic cardiomyopathy and moderate pulmonary hypertension - Currently on Lasix  40 mg IV daily: UOP yesterday 1.9 L and net -275 cc since admission - Labs today SCr 0.9;  K+ 3.5; NA 123; mag 2.0 - BP stable at 128/73 mmHg and O2 sats 95% on room air.  Last night systolic BP was in the low 100s - Did not tolerate Entresto  due to hypotension - Intolerant to SGLT2i - Increase Lasix  to 40 mg IV twice daily given ongoing low Na and net UOP minimally negative - Continue 1500 cc fluid restriction - Follow strict I's and O's, daily weights and renal function while diuresing - Plan from pulmonary is once she is diuresed adequately will obtain chest CT to reassess retrocardiac opacity - Continue carvedilol  6.25 mg twice daily and spironolactone  12.5 mg daily  - Will give a trial of low-dose ARB once she has been adequately diuresed - Would likely benefit from restoring normal sinus rhythm but needs to be diuresed more as she likely will not hold in sinus otherwise - Will ask advanced heart failure to see tomorrow for any further recommendations   Persistent atrial fibrillation Complete heart block  - Status post PPM in 1990s followed in A-fib clinic - Status post failed CRT-D upgrade in 10/25 with subsequent placement of RV ICD lead with generator change out and then subsequent left thoracotomy with LV epicardial lead placement 02/06/2024. - Noted to be in atrial fibrillation by device interrogation on 02/10/2024 - Ultimately underwent DCCV 11/24 - back in atrial flutter with V pacing on EKG>>documented on device interrogation this am - EP evaluated  and after discussing treatment options patient has chosen to start amiodarone  with goal towards ablation in early 2026 - Currently on IV Amio drip with good heart rate control so we will transition to amiodarone  200 mg twice daily p.o. - Restarted Eliquis  5 mg twice daily  yesterday   Hypertension - BP controlled - Continue carvedilol  6.25 mg twice daily and spironolactone  12.5 mg daily  Hyperlipidemia - LDL goal less than 100 - LDL 71 in 08/2023 - Lovastatin  20 mg daily    I spent 35 minutes caring for this patient  today face to face, ordering and reviewing labs, reviewing records from chest x-ray 12-25, 2D echo 03/16/2024, seeing the patient, documenting in the record, and arranging for a 2D echo  For questions or updates, please contact [facility] Please consult [email or web address] for contact info under        Signed, Wilbert Bihari, MD  03/19/2024, 7:40 AM

## 2024-03-20 ENCOUNTER — Encounter (HOSPITAL_COMMUNITY): Payer: Self-pay | Admitting: Cardiology

## 2024-03-20 ENCOUNTER — Inpatient Hospital Stay (HOSPITAL_COMMUNITY): Payer: PRIVATE HEALTH INSURANCE

## 2024-03-20 DIAGNOSIS — I4819 Other persistent atrial fibrillation: Secondary | ICD-10-CM | POA: Diagnosis not present

## 2024-03-20 DIAGNOSIS — R319 Hematuria, unspecified: Secondary | ICD-10-CM

## 2024-03-20 DIAGNOSIS — E871 Hypo-osmolality and hyponatremia: Secondary | ICD-10-CM | POA: Diagnosis not present

## 2024-03-20 DIAGNOSIS — I5043 Acute on chronic combined systolic (congestive) and diastolic (congestive) heart failure: Secondary | ICD-10-CM | POA: Diagnosis not present

## 2024-03-20 LAB — GLUCOSE, CAPILLARY
Glucose-Capillary: 102 mg/dL — ABNORMAL HIGH (ref 70–99)
Glucose-Capillary: 197 mg/dL — ABNORMAL HIGH (ref 70–99)
Glucose-Capillary: 112 mg/dL — ABNORMAL HIGH (ref 70–99)
Glucose-Capillary: 139 mg/dL — ABNORMAL HIGH (ref 70–99)
Glucose-Capillary: 104 mg/dL — ABNORMAL HIGH (ref 70–99)

## 2024-03-20 LAB — CBC WITH DIFFERENTIAL/PLATELET
Abs Immature Granulocytes: 0.04 K/uL (ref 0.00–0.07)
Basophils Absolute: 0 K/uL (ref 0.0–0.1)
Basophils Relative: 0 %
Eosinophils Absolute: 0.2 K/uL (ref 0.0–0.5)
Eosinophils Relative: 3 %
HCT: 37.7 % (ref 36.0–46.0)
Hemoglobin: 12.9 g/dL (ref 12.0–15.0)
Immature Granulocytes: 0 %
Lymphocytes Relative: 18 %
Lymphs Abs: 1.6 K/uL (ref 0.7–4.0)
MCH: 30.4 pg (ref 26.0–34.0)
MCHC: 34.2 g/dL (ref 30.0–36.0)
MCV: 88.7 fL (ref 80.0–100.0)
Monocytes Absolute: 1.1 K/uL — ABNORMAL HIGH (ref 0.1–1.0)
Monocytes Relative: 12 %
Neutro Abs: 6 K/uL (ref 1.7–7.7)
Neutrophils Relative %: 67 %
Platelets: 317 K/uL (ref 150–400)
RBC: 4.25 MIL/uL (ref 3.87–5.11)
RDW: 13.3 % (ref 11.5–15.5)
WBC: 9 K/uL (ref 4.0–10.5)
nRBC: 0 % (ref 0.0–0.2)

## 2024-03-20 LAB — URINALYSIS, ROUTINE W REFLEX MICROSCOPIC
Bacteria, UA: NONE SEEN
Bilirubin Urine: NEGATIVE
Glucose, UA: NEGATIVE mg/dL
Ketones, ur: NEGATIVE mg/dL
Leukocytes,Ua: NEGATIVE
Nitrite: NEGATIVE
Protein, ur: NEGATIVE mg/dL
Specific Gravity, Urine: 1.005 (ref 1.005–1.030)
pH: 8 (ref 5.0–8.0)

## 2024-03-20 LAB — BASIC METABOLIC PANEL WITH GFR
Anion gap: 15 (ref 5–15)
BUN: 23 mg/dL (ref 8–23)
CO2: 28 mmol/L (ref 22–32)
Calcium: 9.2 mg/dL (ref 8.9–10.3)
Chloride: 89 mmol/L — ABNORMAL LOW (ref 98–111)
Creatinine, Ser: 0.92 mg/dL (ref 0.44–1.00)
GFR, Estimated: >60 mL/min (ref >60)
Glucose, Bld: 95 mg/dL (ref 70–99)
Potassium: 3.8 mmol/L (ref 3.5–5.1)
Sodium: 131 mmol/L — ABNORMAL LOW (ref 135–145)

## 2024-03-20 LAB — BODY FLUID CULTURE W GRAM STAIN
Culture: NO GROWTH
Gram Stain: NONE SEEN

## 2024-03-20 LAB — MAGNESIUM: Magnesium: 1.8 mg/dL (ref 1.7–2.4)

## 2024-03-20 LAB — LIPOPROTEIN A (LPA): Lipoprotein (a): 47.8 nmol/L — ABNORMAL HIGH (ref <75.0)

## 2024-03-20 MED ORDER — IOHEXOL 350 MG/ML SOLN
100.0000 mL | Freq: Once | INTRAVENOUS | Status: AC | PRN
  Administered 2024-03-20: 100 mL via INTRAVENOUS

## 2024-03-20 MED ORDER — APIXABAN 5 MG PO TABS
5.0000 mg | ORAL_TABLET | Freq: Two times a day (BID) | ORAL | Status: DC
  Administered 2024-03-20 – 2024-03-22 (×4): 5 mg via ORAL
  Filled 2024-03-20 (×4): qty 1

## 2024-03-20 NOTE — Plan of Care (Signed)
 Discussed case with Dr. Nieves this morning regarding hematuria. Obtained CT A/P w/wo contrast to evaluate along with UA as already ordered. No signs of infection on UA; noted Hgb as expected. Results of CT discussed with Dr. Nieves upon resulting.  CT unconcerning for etiology of hematuria and felt to be clinically insignificant at this time and okay to resume Eliquis  tonight and monitor for any recurrence.  No further hematuria today as well since this morning.  She will follow up with urology outpatient for cystoscopy at that time.   Alm Apo, MD [facility] 03/20/2024, 4:44 PM

## 2024-03-20 NOTE — Plan of Care (Signed)
  Problem: Education: Goal: Knowledge of General Education information will improve Description: Including pain rating scale, medication(s)/side effects and non-pharmacologic comfort measures Outcome: Progressing   Problem: Health Behavior/Discharge Planning: Goal: Ability to manage health-related needs will improve Outcome: Progressing   Problem: Clinical Measurements: Goal: Respiratory complications will improve Outcome: Progressing Goal: Cardiovascular complication will be avoided Outcome: Progressing   Problem: Nutrition: Goal: Adequate nutrition will be maintained Outcome: Progressing   Problem: Coping: Goal: Level of anxiety will decrease Outcome: Progressing   Problem: Elimination: Goal: Will not experience complications related to bowel motility Outcome: Progressing   Problem: Pain Managment: Goal: General experience of comfort will improve and/or be controlled Outcome: Progressing   Problem: Safety: Goal: Ability to remain free from injury will improve Outcome: Progressing   Problem: Skin Integrity: Goal: Risk for impaired skin integrity will decrease Outcome: Progressing   Problem: Cardiovascular: Goal: Ability to achieve and maintain adequate cardiovascular perfusion will improve Outcome: Progressing   Problem: Coping: Goal: Ability to adjust to condition or change in health will improve Outcome: Progressing   Problem: Fluid Volume: Goal: Ability to maintain a balanced intake and output will improve Outcome: Progressing   Problem: Health Behavior/Discharge Planning: Goal: Ability to manage health-related needs will improve Outcome: Progressing

## 2024-03-20 NOTE — Assessment & Plan Note (Addendum)
-   No prior similar history -Developed overnight of 12/6 into 12/7; does feel a little bit of burning with urination -UA negative for infection -Eliquis  held for now; last dose evening of 03/19/2024 -Discussed with urology as well.  CT A/P requested and negative for evidence of hematuria etiology -Reviewed CT with Dr. Nieves; hematuria felt to be clinically insignificant and she is recommended to follow-up outpatient for cystoscopy.  Patient understands plan and findings of CT reviewed as well; information added to AVS - Eliquis  resumed evening of 03/20/2024.  No further hematuria

## 2024-03-20 NOTE — Progress Notes (Addendum)
 Progress Note  Patient Name: [name] [name] Date of Encounter: 03/20/2024  [facility] Cardiologist: None   Patient Profile     Subjective   Denies any shortness of breath or chest pain  Started having frank hematuria this morning  She put out 2.4 L yesterday and is net negative 2.4 L since admission  Weight down 3lbs since admission  Inpatient Medications    Scheduled Meds:  amiodarone   200 mg Oral BID   apixaban   5 mg Oral BID   carvedilol   6.25 mg Oral BID WC   Chlorhexidine  Gluconate Cloth  6 each Topical Daily   feeding supplement  237 mL Oral BID BM   furosemide   40 mg Intravenous BID   insulin  aspart  0-6 Units Subcutaneous TID AC & HS   levothyroxine   112 mcg Oral QHS   pravastatin   20 mg Oral q1800   sodium chloride  flush  3 mL Intravenous Q12H   spironolactone   25 mg Oral Daily   Continuous Infusions:   PRN Meds: acetaminophen , clonazePAM , docusate sodium , ondansetron  (ZOFRAN ) IV, mouth rinse, oxyCODONE , polyethylene glycol, sodium chloride  flush   Vital Signs    Vitals:   03/19/24 1920 03/20/24 0012 03/20/24 0314 03/20/24 0609  BP: 100/66 (!) 99/49 107/70   Pulse: 70 70 70   Resp: 20 19 19 18   Temp: 98.1 F (36.7 C) 98.3 F (36.8 C) 98.1 F (36.7 C)   TempSrc: Oral Oral Oral   SpO2: 94% 95% 92%   Weight:    81.4 kg    Intake/Output Summary (Last 24 hours) at 03/20/2024 0715 Last data filed at 03/20/2024 9390 Gross per 24 hour  Intake 240 ml  Output 2400 ml  Net -2160 ml      03/20/2024    6:09 AM 03/19/2024    3:57 AM 03/18/2024    4:24 AM  Last 3 Weights  Weight (lbs) 179 lb 7.3 oz 182 lb 181 lb 14.1 oz  Weight (kg) 81.4 kg 82.555 kg 82.5 kg      Telemetry     aflutter with BiV paced rhythm- Personally Reviewed  ECG    No new EKG to review- Personally Reviewed  Physical Exam   GEN: Well nourished, well developed in no acute distress HEENT: Normal NECK: No JVD; No carotid bruits LYMPHATICS: No  lymphadenopathy CARDIAC:RRR, no murmurs, rubs, gallops RESPIRATORY:  Clear to auscultation without rales, wheezing or rhonchi  ABDOMEN: Soft, non-tender, non-distended MUSCULOSKELETAL:  No edema; No deformity  SKIN: Warm and dry NEUROLOGIC:  Alert and oriented x 3 PSYCHIATRIC:  Normal affect  Labs    High Sensitivity Troponin:   Recent Labs  Lab 03/15/24 0836 03/15/24 1022  TROPONINIHS 24* 21*      Chemistry Recent Labs  Lab 03/15/24 1928 03/16/24 0250 03/18/24 0917 03/19/24 0155 03/20/24 0319  NA 120*   < > 122* 123* 131*  K  --    < > 3.8 3.5 3.8  CL  --    < > 88* 85* 89*  CO2  --    < > 27 24 28   GLUCOSE  --    < > 205* 289* 95  BUN  --    < > 16 21 23   CREATININE  --    < > 0.77 0.90 0.92  CALCIUM   --    < > 9.0 9.2 9.2  PROT 6.7  --   --   --   --   GFRNONAA  --    < > >  60 >60 >60  ANIONGAP  --    < > 7 14 15    < > = values in this interval not displayed.     Hematology Recent Labs  Lab 03/18/24 0917 03/19/24 0155 03/20/24 0319  WBC 8.5 12.5* 9.0  RBC 3.85* 3.85* 4.25  HGB 11.8* 11.9* 12.9  HCT 33.1* 33.8* 37.7  MCV 86.0 87.8 88.7  MCH 30.6 30.9 30.4  MCHC 35.6 35.2 34.2  RDW 12.9 13.0 13.3  PLT 250 275 317    BNP Recent Labs  Lab 03/15/24 0836 03/16/24 1120  BNP 741.8* 502.6*     DDimer No results for input(s): DDIMER in the last 168 hours.   CHA2DS2-VASc Score = 5   This indicates a 7.2% annual risk of stroke. The patient's score is based upon: CHF History: 1 HTN History: 1 Diabetes History: 0 Stroke History: 0 Vascular Disease History: 1 Age Score: 1 Gender Score: 1      Radiology    CARDIAC CATHETERIZATION Result Date: 03/18/2024 Coronary angiography 03/18/2024: LM: Normal LAD: Normal, no significant disease Lcx: Normal, no significant disease RCA: Dominant. Normal, no significant disease LVEDP 23 mmHg Right heart catheterization 03/18/2024: RA: 16 mmHg RV: 45/6 mmHg PA: 43/26 mmHg, mPAP 33 mmHg PCW: could not ob obtained  PAPi 1.0 AO sats: 93% PA sats: 64% CO: 5.2 L/min CI: 2.8 L/min/m2 Conclusion: Non coronary artery disease Elevated filling pressures, including RA Severe biventricular failure Decompensated nonischemic cardiomyopathy Moderate pulmonary hypertension. WHOP Grp II Recommendation: Continue diuresis and GDNT for HFrEF Okay to resume Eliquis  03/18/2024 morning, unless any other procedures planned [name], MD    Patient Profile     67 y.o. female with a hx of CHB s/p ICD, nonischemic cardiomyopathy, chronic HFrEF, persistent A-Fib s/p recent DCCV 03/07/2024, PVD, aortic and coronary calcifications per CT, hypertension, hypothyrdoism who is being seen 03/15/2024 for the evaluation of CHF at the request of Dr. Harold.   Assessment & Plan    Hyponatremia Left pleural effusion Nonischemic cardiomyopathy Shortness of breath Elevated BNP Moderate pulmonary hypertension -Normal coronary arteries by cath 2016  -patient has a known history of nonischemic cardiomyopathy with most recent 2D echo in 2022 showing EF of 30 to 35% with GHK she has not had a 2D echo done since then - Status post attempted CRT-D upgrade by Dr. 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  RV ICD lead was implanted and generator changed out. - Chest x-ray at that time was clear - underwent left thoracotomy for LV epicardial lead placement 1025 and chest x-ray post thoracotomy showed persistent left pleural effusion with retrocardiac opacification of the left lower lobe and follow-up the next day showed retrocardiac left basilar opacity that was felt to be atelectasis or airspace disease. -  Follow-up chest x-ray 02/15/2024 showed improved left basilar airspace opacity with postop atelectasis and mild pulmonary venous congestion.   - Had recurrence of atrial fibrillation on 10/29 and underwent cardioversion on 11/24 - Follow-up chest x-ray 11/18 showed worsening  moderate size left effusion associate with compressive atelectasis in the left base. - Now admitted with 1 week history of worsening shortness of breath, orthopnea but no fever/chills/cough/lower extremity edema/increased abdominal fullness. - BNP mildly elevated  - Found to have a sodium level of 118 and low urine osmolality  - TSH normal 3.12 - Noted to be back in atrial fibrillation - Status post thoracentesis of 1.3 L of yellow  appearing fluid - 2D echo this admit: EF 20 to 25% with global HK, severe LVE with severe eccentric LVH, G3 DD and possible early forming LV thrombus, mild RV dysfunction and RAP 15 mmHg - Cath 12/5 with normal coronary arteries and elevated filling pressures including RA consistent with severe biventricular failure with decompensated nonischemic cardiomyopathy and moderate pulmonary hypertension - Currently on Lasix  40 mg IV daily: UOP yesterday 2.4 L and net - 2.4 L  since admission - Labs today SCr 0.92; BUN 23; CO2 28; K+ 3.8; NA 131 - BP stable but soft at 107/70 mmHg and 92-95 O2 sats  on room air.  - Did not tolerate Entresto  due to hypotension - Intolerant to SGLT2i - Hyponatremia has significantly improved with diuresis.  I am going to give her 1 day 4 of IV diuretics and then likely transition to p.o. tomorrow - Continue 1500 cc fluid restriction - Follow strict I's and O's, daily weights and renal function while diuresing - Pulmonary continues to follow and plan is once she is diuresed adequately will obtain chest CT to reassess retrocardiac opacity - Continue carvedilol  6.25 mg twice daily, spironolactone  12.5 mg daily  - BP too soft to add ARB at this time but hopefully once she has been adequately diuresed blood pressure will improve and we add low-dose losartan   - Would likely benefit from restoring normal sinus rhythm but now he is off anticoagulation due to frank hematuria - Advanced heart failure was going to see to make any further  recommendations   Persistent atrial fibrillation Complete heart block  - Status post PPM in 1990s followed in A-fib clinic - Status post failed CRT-D upgrade in 10/25 with subsequent placement of RV ICD lead with generator change out and then subsequent left thoracotomy with LV epicardial lead placement 02/06/2024. - Noted to be in atrial fibrillation by device interrogation on 02/10/2024 - Ultimately underwent DCCV 11/24 - back in atrial flutter with V pacing on EKG>>documented on device interrogation this am - EP evaluated  and after discussing treatment options patient has chosen to start amiodarone  with goal towards ablation in early 2026 - Continue Amio 200 mg twice daily for heart rate control - Now having frank hematuria so we will stop anticoagulation for now>> placing SCDs for now for DVT prophylaxis   Hypertension - BP controlled - Continue carvedilol  6.25 mg twice daily and spironolactone  12.5 mg daily  Hyperlipidemia - LDL goal less than 100 - LDL 71 in 08/2023 - Lovastatin  20 mg daily  Hematuria -Patient has frank blood in her urine this morning -Stopping Eliquis  -Will ask urology to see this morning -Send off UA    I spent 35 minutes caring for this patient today face to face, ordering and reviewing labs, reviewing records from chest x-ray 12-25, 2D echo 03/16/2024, seeing the patient, documenting in the record, and arranging for a 2D echo  For questions or updates, please contact [facility] Please consult [email or web address] for contact info under        Signed, Wilbert Bihari, MD  03/20/2024, 7:15 AM

## 2024-03-20 NOTE — Progress Notes (Signed)
 Progress Note    [name] [name]   FMW:[id number]  DOB: 09-Nov-1955  DOA: 03/15/2024     5 PCP: Sherial Bail, MD  Initial CC: SOB  Hospital Course: [name] is a 68 yo female with PMH afib, CHB s/p PPM, HTN, HLD, sCHF, anxiety, GERD, gout, hypothyroid, PAD s/p iliac stent who initially presented on 12/2 with SOB lasting about 4 to 5 days with progressive worsening. There was report of med compliance but increase in water  intake prior to admission.  On workup she was found to have hyponatremia, 119.  Initially admitted to the ICU. Cardiology and EP were consulted as well. She was started on lasix  and underwent further workup.   Interval History:  Developed some mild hematuria overnight. Visualized personally in hat in bathroom this morning. No known prior history of hematuria she says; does endorse some dysuria this morning as well.  Workup commenced and case discussed with urology as well; for now recommends CT A/P before potential consult.   Assessment and Plan: * Hyponatremia syndrome - Sodium 119 on admission.  Previously normal in November - etiology on admission was due to CHF - for now is being continued on lasix  and aldactone , esp given echo findings - continue trending Na; she is asymptomatic  - Na 131, stable this am  Acute on chronic combined systolic and diastolic CHF (congestive heart failure) (HCC) - Echo performed 03/16/2024: EF 20 to 25%, global hypokinesis, severe LVH, grade 3 DD.  Apical akinesis and swirling of contrast concerning for early thrombus formation - s/p PPM historically and recent left anterolateral thoracotomy for insertion of 2 left ventricular epicardial pacing leads and revision of BiV pacing system on 02/05/2024 - Cardiology following closely - Remains on diuresis with Lasix  - on aldactone  as well  - s/p R/L heart cath on 12/5; no significant disease; elevated filling pressures including RA, severe biventricular failure, moderate pulmonary  hypertension -ACHF to see 12/7  Recurrent left pleural effusion - left effusion; appears started after thoracotomy on 02/05/2024 but has improved some with diuresis since then however recurring -Underwent left thoracentesis in the ICU on 03/15/2024 with 1.3 L fluid drained - exudative by protein; transudative by LDH - ongoing diuresis being attempted for treatment - given unilateral, differential includes infectious vs CHF vs ?complication from thoracotomy since new onset after procedure (had chest tube 10/24>>10/26) - add on to prior thoracentesis fluid: pH (in process), cell count (505 total nuc cells), and culture (no organisms)  Persistent atrial fibrillation (HCC) - Transitioned to oral amiodarone  on 12/6 per cardiology - Heparin  drip being converted back to Eliquis  on 03/18/2024 given no further procedures -Due to development of hematuria on 12/7, Eliquis  held - continue coreg   Hematuria - No prior similar history -Developed overnight of 12/6 into 12/7; does feel a little bit of burning with urination -Checking UA -Eliquis  held for now; last dose evening of 03/19/2024 -Discussed with urology as well.  CT A/P requested first - very questionable contrast allergy noted 02/2011; no documentation of reaction and patient has no recollection of anything either; also had CTA chest PE workup in 2016 - she is okay with trial of contrast for CT and forgoing any pre-meds; I am comfortable with this also. Will delete contrast allergy for now as well   Atrial flutter (HCC) - per EP: Amiodarone  400mg  BID for 1 week Then > 400mg  daily for 1 week Then > 200mg  diaily EP follow up will be arranged for ~ 3 weeks to reassess  rhythm plan for DCCV if needed ~ Jan-Feb with Dr. Kennyth to look towards timing of ablation   AKI (acute kidney injury)-resolved as of 03/20/2024 - baseline creatinine ~ 0.8 - 0.9 - patient presents with increase in creat >0.3 mg/dL above baseline or creat increase >1.5x  baseline presumed to have occurred within past 7 days PTA - creat 1.25 on admission - presumed CRS - stable and improved on diuresis for now  Hypothyroidism - Continue Synthroid   Heart block AV complete (HCC) - see CHF; now has PPM and ICD  Pure hypercholesterolemia - Continue pravastatin   Primary hypertension - soft pressures - okay for coreg  for now; will reduce or hold if necessary    Antimicrobials:   DVT prophylaxis:  Place and maintain sequential compression device Start: 03/20/24 0728 SCD's Start: 03/18/24 0902   Code Status:   Code Status: Full Code  Mobility Assessment (Last 72 Hours)     Mobility Assessment     Row Name 03/19/24 1950 03/19/24 0800 03/18/24 2015 03/18/24 0800 03/17/24 2042   Does the patient have exclusion criteria? No- Perform mobility assessment No- Perform mobility assessment No- Perform mobility assessment No- Perform mobility assessment No- Perform mobility assessment   What is the highest level of mobility based on the mobility assessment? Level 4 (Ambulates with assistance) - Balance while stepping forward/back - Complete Level 4 (Ambulates with assistance) - Balance while stepping forward/back - Complete Level 4 (Ambulates with assistance) - Balance while stepping forward/back - Complete Level 4 (Ambulates with assistance) - Balance while stepping forward/back - Complete Level 4 (Ambulates with assistance) - Balance while stepping forward/back - Complete      Diet: Diet Orders (From admission, onward)     Start     Ordered   03/18/24 0902  Diet Heart Room service appropriate? Yes; Fluid consistency: Thin  Diet effective now       Comments: 2 g sodium restriction  Question Answer Comment  Room service appropriate? Yes   Fluid consistency: Thin      03/18/24 0901            Barriers to discharge: None Disposition Plan: TBD HH orders placed: TBD Status is: Inpatient  Objective: Blood pressure (!) 100/58, pulse 79,  temperature 98 F (36.7 C), temperature source Oral, resp. rate 19, weight 81.4 kg, SpO2 95%.  Examination:  Physical Exam Constitutional:      Appearance: Normal appearance.  HENT:     Head: Normocephalic and atraumatic.     Mouth/Throat:     Mouth: Mucous membranes are moist.  Eyes:     Extraocular Movements: Extraocular movements intact.  Cardiovascular:     Rate and Rhythm: Normal rate and regular rhythm.  Pulmonary:     Effort: Pulmonary effort is normal. No respiratory distress.     Breath sounds: No decreased breath sounds or wheezing.  Abdominal:     General: Bowel sounds are normal. There is no distension.     Palpations: Abdomen is soft.     Tenderness: There is no abdominal tenderness.  Genitourinary:    Comments: Mild hematuria noted in hat in bathroom with sediment Musculoskeletal:        General: Normal range of motion.     Cervical back: Normal range of motion and neck supple.     Right lower leg: No edema.     Left lower leg: No edema.  Skin:    General: Skin is warm and dry.  Neurological:     General:  No focal deficit present.     Mental Status: She is alert.  Psychiatric:        Mood and Affect: Mood normal.      Consultants:  Cardiology EP Pulmonology  Procedures:  03/15/2024: Left thoracentesis 03/18/2024: Left and right heart cath  Data Reviewed: Results for orders placed or performed during the hospital encounter of 03/15/24 (from the past 24 hours)  Glucose, capillary     Status: Abnormal   Collection Time: 03/19/24  4:02 PM  Result Value Ref Range   Glucose-Capillary 112 (H) 70 - 99 mg/dL  Glucose, capillary     Status: Abnormal   Collection Time: 03/19/24  9:46 PM  Result Value Ref Range   Glucose-Capillary 108 (H) 70 - 99 mg/dL   Comment 1 Notify RN    Comment 2 Document in Chart   Basic metabolic panel with GFR     Status: Abnormal   Collection Time: 03/20/24  3:19 AM  Result Value Ref Range   Sodium 131 (L) 135 - 145 mmol/L    Potassium 3.8 3.5 - 5.1 mmol/L   Chloride 89 (L) 98 - 111 mmol/L   CO2 28 22 - 32 mmol/L   Glucose, Bld 95 70 - 99 mg/dL   BUN 23 8 - 23 mg/dL   Creatinine, Ser 9.07 0.44 - 1.00 mg/dL   Calcium  9.2 8.9 - 10.3 mg/dL   GFR, Estimated >39 >39 mL/min   Anion gap 15 5 - 15  CBC with Differential/Platelet     Status: Abnormal   Collection Time: 03/20/24  3:19 AM  Result Value Ref Range   WBC 9.0 4.0 - 10.5 K/uL   RBC 4.25 3.87 - 5.11 MIL/uL   Hemoglobin 12.9 12.0 - 15.0 g/dL   HCT 62.2 63.9 - 53.9 %   MCV 88.7 80.0 - 100.0 fL   MCH 30.4 26.0 - 34.0 pg   MCHC 34.2 30.0 - 36.0 g/dL   RDW 86.6 88.4 - 84.4 %   Platelets 317 150 - 400 K/uL   nRBC 0.0 0.0 - 0.2 %   Neutrophils Relative % 67 %   Neutro Abs 6.0 1.7 - 7.7 K/uL   Lymphocytes Relative 18 %   Lymphs Abs 1.6 0.7 - 4.0 K/uL   Monocytes Relative 12 %   Monocytes Absolute 1.1 (H) 0.1 - 1.0 K/uL   Eosinophils Relative 3 %   Eosinophils Absolute 0.2 0.0 - 0.5 K/uL   Basophils Relative 0 %   Basophils Absolute 0.0 0.0 - 0.1 K/uL   Immature Granulocytes 0 %   Abs Immature Granulocytes 0.04 0.00 - 0.07 K/uL  Magnesium      Status: None   Collection Time: 03/20/24  3:19 AM  Result Value Ref Range   Magnesium  1.8 1.7 - 2.4 mg/dL  Glucose, capillary     Status: Abnormal   Collection Time: 03/20/24  6:02 AM  Result Value Ref Range   Glucose-Capillary 102 (H) 70 - 99 mg/dL   Comment 1 Notify RN    Comment 2 Document in Chart     I have reviewed pertinent nursing notes, vitals, labs, and images as necessary. I have ordered labwork to follow up on as indicated.  I have reviewed the last notes from staff over past 24 hours. I have discussed patient's care plan and test results with nursing staff, CM/SW, and other staff as appropriate.  Old records reviewed in assessment of this patient  Time spent: Greater than 50% of the 55  minute visit was spent in counseling/coordination of care for the patient as laid out in the A&P.   LOS: 5  days   Alm Apo, MD [facility] 03/20/2024, 11:37 AM

## 2024-03-20 NOTE — Progress Notes (Signed)
 Pt stated that there was a trace amount of blood in her urine this morning.  Lonell LITTIE Lyme, RN

## 2024-03-21 DIAGNOSIS — Z91041 Radiographic dye allergy status: Secondary | ICD-10-CM

## 2024-03-21 LAB — CBC WITH DIFFERENTIAL/PLATELET
Abs Immature Granulocytes: 0.08 K/uL — ABNORMAL HIGH (ref 0.00–0.07)
Basophils Absolute: 0 K/uL (ref 0.0–0.1)
Basophils Relative: 0 %
Eosinophils Absolute: 0.3 K/uL (ref 0.0–0.5)
Eosinophils Relative: 3 %
HCT: 38.3 % (ref 36.0–46.0)
Hemoglobin: 13.2 g/dL (ref 12.0–15.0)
Immature Granulocytes: 1 %
Lymphocytes Relative: 18 %
Lymphs Abs: 1.7 K/uL (ref 0.7–4.0)
MCH: 30.4 pg (ref 26.0–34.0)
MCHC: 34.5 g/dL (ref 30.0–36.0)
MCV: 88.2 fL (ref 80.0–100.0)
Monocytes Absolute: 1.1 K/uL — ABNORMAL HIGH (ref 0.1–1.0)
Monocytes Relative: 12 %
Neutro Abs: 6.4 K/uL (ref 1.7–7.7)
Neutrophils Relative %: 66 %
Platelets: 315 K/uL (ref 150–400)
RBC: 4.34 MIL/uL (ref 3.87–5.11)
RDW: 13.4 % (ref 11.5–15.5)
WBC: 9.7 K/uL (ref 4.0–10.5)
nRBC: 0 % (ref 0.0–0.2)

## 2024-03-21 LAB — BASIC METABOLIC PANEL WITH GFR
Anion gap: 10 (ref 5–15)
BUN: 23 mg/dL (ref 8–23)
CO2: 32 mmol/L (ref 22–32)
Calcium: 9.2 mg/dL (ref 8.9–10.3)
Chloride: 89 mmol/L — ABNORMAL LOW (ref 98–111)
Creatinine, Ser: 0.99 mg/dL (ref 0.44–1.00)
GFR, Estimated: >60 mL/min (ref >60)
Glucose, Bld: 118 mg/dL — ABNORMAL HIGH (ref 70–99)
Potassium: 4 mmol/L (ref 3.5–5.1)
Sodium: 131 mmol/L — ABNORMAL LOW (ref 135–145)

## 2024-03-21 LAB — GLUCOSE, CAPILLARY
Glucose-Capillary: 120 mg/dL — ABNORMAL HIGH (ref 70–99)
Glucose-Capillary: 139 mg/dL — ABNORMAL HIGH (ref 70–99)
Glucose-Capillary: 148 mg/dL — ABNORMAL HIGH (ref 70–99)

## 2024-03-21 LAB — MAGNESIUM: Magnesium: 1.9 mg/dL (ref 1.7–2.4)

## 2024-03-21 MED ORDER — FUROSEMIDE 40 MG PO TABS
40.0000 mg | ORAL_TABLET | Freq: Every day | ORAL | Status: DC
  Administered 2024-03-22: 40 mg via ORAL
  Filled 2024-03-21: qty 1

## 2024-03-21 NOTE — Progress Notes (Signed)
 Progress Note    [name] [name]   FMW:[id number]  DOB: November 01, 1955  DOA: 03/15/2024     6 PCP: Sherial Bail, MD  Initial CC: SOB  Hospital Course: Ms. Shadle is a 68 yo female with PMH afib, CHB s/p PPM, HTN, HLD, sCHF, anxiety, GERD, gout, hypothyroid, PAD s/p iliac stent who initially presented on 12/2 with SOB lasting about 4 to 5 days with progressive worsening. There was report of med compliance but increase in water  intake prior to admission.  On workup she was found to have hyponatremia, 119.  Initially admitted to the ICU. Cardiology and EP were consulted as well. She was started on lasix  and underwent further workup.   Interval History:  No further hematuria since yesterday.  Eliquis  resumed last night.  CT findings also discussed with patient and also no issues with contrast. Potential discharge tomorrow after monitoring 1 more night in hospital after resuming Eliquis .  Ultimately up to cardiology on discharge timing.  Assessment and Plan: * Hyponatremia syndrome - Sodium 119 on admission.  Previously normal in November - etiology on admission was due to CHF - for now is being continued on lasix  and aldactone , esp given echo findings - continue trending Na; she is asymptomatic  - Na 131, stable this am  Acute on chronic combined systolic and diastolic CHF (congestive heart failure) (HCC) - Echo performed 03/16/2024: EF 20 to 25%, global hypokinesis, severe LVH, grade 3 DD.  Apical akinesis and swirling of contrast concerning for early thrombus formation - s/p PPM historically and recent left anterolateral thoracotomy for insertion of 2 left ventricular epicardial pacing leads and revision of BiV pacing system on 02/05/2024 - Cardiology following closely - Remains on diuresis with Lasix  - on aldactone  as well  - s/p R/L heart cath on 12/5; no significant disease; elevated filling pressures including RA, severe biventricular failure, moderate pulmonary  hypertension -ACHF to see 12/7  Recurrent left pleural effusion - left effusion; appears started after thoracotomy on 02/05/2024 but has improved some with diuresis since then however recurring -Underwent left thoracentesis in the ICU on 03/15/2024 with 1.3 L fluid drained - exudative by protein; transudative by LDH - ongoing diuresis being attempted for treatment - given unilateral, differential includes infectious vs CHF vs ?complication from thoracotomy since new onset after procedure (had chest tube 10/24>>10/26) - add on to prior thoracentesis fluid: pH (in process), cell count (505 total nuc cells), and culture (no organisms)  Persistent atrial fibrillation (HCC) - Transitioned to oral amiodarone  on 12/6 per cardiology - Heparin  drip being converted back to Eliquis  on 03/18/2024 given no further procedures -Due to development of hematuria on 12/7, Eliquis  held - No further hematuria and workup negative, Eliquis  resumed evening of 03/20/2024 - continue coreg   Hematuria - No prior similar history -Developed overnight of 12/6 into 12/7; does feel a little bit of burning with urination -UA negative for infection -Eliquis  held for now; last dose evening of 03/19/2024 -Discussed with urology as well.  CT A/P requested and negative for evidence of hematuria etiology -Reviewed CT with Dr. Nieves; hematuria felt to be clinically insignificant and she is recommended to follow-up outpatient for cystoscopy.  Patient understands plan and findings of CT reviewed as well; information added to AVS - Eliquis  resumed evening of 03/20/2024.  No further hematuria  Atrial flutter (HCC) - per EP: Amiodarone  400mg  BID for 1 week Then > 400mg  daily for 1 week Then > 200mg  diaily EP follow up will be arranged for ~  3 weeks to reassess rhythm plan for DCCV if needed ~ Jan-Feb with Dr. Kennyth to look towards timing of ablation   AKI (acute kidney injury)-resolved as of 03/20/2024 - baseline creatinine ~  0.8 - 0.9 - patient presents with increase in creat >0.3 mg/dL above baseline or creat increase >1.5x baseline presumed to have occurred within past 7 days PTA - creat 1.25 on admission - presumed CRS - stable and improved on diuresis for now  Hypothyroidism - Continue Synthroid   Heart block AV complete (HCC) - see CHF; now has PPM and ICD  Pure hypercholesterolemia - Continue pravastatin   Primary hypertension - soft pressures - okay for coreg  for now; will reduce or hold if necessary   History of allergy to radiographic contrast media-resolved as of 03/21/2024 - very questionable contrast allergy noted 02/2011; no documentation of reaction and patient has no recollection of anything either; also had CTA chest PE workup in 2016 - she is okay with trial of contrast for CT and forgoing any pre-meds - She successfully tolerated CT A/P with contrast on 03/20/2024 -Contrast allergy deleted as ambiguous entry of unclear etiology and has tolerated 2 CAT scans since allergy posted; patient in agreement with deletion as well   Antimicrobials:   DVT prophylaxis:  Place and maintain sequential compression device Start: 03/20/24 0728 SCD's Start: 03/18/24 0902 apixaban  (ELIQUIS ) tablet 5 mg   Code Status:   Code Status: Full Code  Mobility Assessment (Last 72 Hours)     Mobility Assessment     Row Name 03/21/24 0800 03/20/24 1942 03/20/24 0809 03/19/24 1950 03/19/24 0800   Does the patient have exclusion criteria? No- Perform mobility assessment No- Perform mobility assessment No- Perform mobility assessment No- Perform mobility assessment No- Perform mobility assessment   What is the highest level of mobility based on the mobility assessment? Level 5 (Ambulates independently) - Balance while walking independently - Complete Level 5 (Ambulates independently) - Balance while walking independently - Complete Level 5 (Ambulates independently) - Balance while walking independently - Complete  Level 4 (Ambulates with assistance) - Balance while stepping forward/back - Complete Level 4 (Ambulates with assistance) - Balance while stepping forward/back - Complete    Row Name 03/18/24 2015           Does the patient have exclusion criteria? No- Perform mobility assessment       What is the highest level of mobility based on the mobility assessment? Level 4 (Ambulates with assistance) - Balance while stepping forward/back - Complete          Diet: Diet Orders (From admission, onward)     Start     Ordered   03/18/24 0902  Diet Heart Room service appropriate? Yes; Fluid consistency: Thin  Diet effective now       Comments: 2 g sodium restriction  Question Answer Comment  Room service appropriate? Yes   Fluid consistency: Thin      03/18/24 0901            Barriers to discharge: None Disposition Plan: TBD HH orders placed: TBD Status is: Inpatient  Objective: Blood pressure 126/83, pulse 70, temperature 97.9 F (36.6 C), temperature source Oral, resp. rate 18, height 5' 4 (1.626 m), weight 80.9 kg, SpO2 97%.  Examination:  Physical Exam Constitutional:      Appearance: Normal appearance.  HENT:     Head: Normocephalic and atraumatic.     Mouth/Throat:     Mouth: Mucous membranes are moist.  Eyes:  Extraocular Movements: Extraocular movements intact.  Cardiovascular:     Rate and Rhythm: Normal rate and regular rhythm.  Pulmonary:     Effort: Pulmonary effort is normal. No respiratory distress.     Breath sounds: No decreased breath sounds or wheezing.  Abdominal:     General: Bowel sounds are normal. There is no distension.     Palpations: Abdomen is soft.     Tenderness: There is no abdominal tenderness.  Genitourinary:    Comments:  urine now clear yellow Musculoskeletal:        General: Normal range of motion.     Cervical back: Normal range of motion and neck supple.     Right lower leg: No edema.     Left lower leg: No edema.  Skin:     General: Skin is warm and dry.  Neurological:     General: No focal deficit present.     Mental Status: She is alert.  Psychiatric:        Mood and Affect: Mood normal.      Consultants:  Cardiology EP Pulmonology  Procedures:  03/15/2024: Left thoracentesis 03/18/2024: Left and right heart cath  Data Reviewed: Results for orders placed or performed during the hospital encounter of 03/15/24 (from the past 24 hours)  Glucose, capillary     Status: Abnormal   Collection Time: 03/20/24  2:01 PM  Result Value Ref Range   Glucose-Capillary 112 (H) 70 - 99 mg/dL  Glucose, capillary     Status: Abnormal   Collection Time: 03/20/24  5:00 PM  Result Value Ref Range   Glucose-Capillary 139 (H) 70 - 99 mg/dL  Glucose, capillary     Status: Abnormal   Collection Time: 03/20/24  9:13 PM  Result Value Ref Range   Glucose-Capillary 104 (H) 70 - 99 mg/dL  Basic metabolic panel with GFR     Status: Abnormal   Collection Time: 03/21/24  3:22 AM  Result Value Ref Range   Sodium 131 (L) 135 - 145 mmol/L   Potassium 4.0 3.5 - 5.1 mmol/L   Chloride 89 (L) 98 - 111 mmol/L   CO2 32 22 - 32 mmol/L   Glucose, Bld 118 (H) 70 - 99 mg/dL   BUN 23 8 - 23 mg/dL   Creatinine, Ser 9.00 0.44 - 1.00 mg/dL   Calcium  9.2 8.9 - 10.3 mg/dL   GFR, Estimated >39 >39 mL/min   Anion gap 10 5 - 15  CBC with Differential/Platelet     Status: Abnormal   Collection Time: 03/21/24  3:22 AM  Result Value Ref Range   WBC 9.7 4.0 - 10.5 K/uL   RBC 4.34 3.87 - 5.11 MIL/uL   Hemoglobin 13.2 12.0 - 15.0 g/dL   HCT 61.6 63.9 - 53.9 %   MCV 88.2 80.0 - 100.0 fL   MCH 30.4 26.0 - 34.0 pg   MCHC 34.5 30.0 - 36.0 g/dL   RDW 86.5 88.4 - 84.4 %   Platelets 315 150 - 400 K/uL   nRBC 0.0 0.0 - 0.2 %   Neutrophils Relative % 66 %   Neutro Abs 6.4 1.7 - 7.7 K/uL   Lymphocytes Relative 18 %   Lymphs Abs 1.7 0.7 - 4.0 K/uL   Monocytes Relative 12 %   Monocytes Absolute 1.1 (H) 0.1 - 1.0 K/uL   Eosinophils Relative 3 %    Eosinophils Absolute 0.3 0.0 - 0.5 K/uL   Basophils Relative 0 %   Basophils Absolute 0.0  0.0 - 0.1 K/uL   Immature Granulocytes 1 %   Abs Immature Granulocytes 0.08 (H) 0.00 - 0.07 K/uL  Magnesium      Status: None   Collection Time: 03/21/24  3:22 AM  Result Value Ref Range   Magnesium  1.9 1.7 - 2.4 mg/dL  Glucose, capillary     Status: Abnormal   Collection Time: 03/21/24  6:10 AM  Result Value Ref Range   Glucose-Capillary 120 (H) 70 - 99 mg/dL  Glucose, capillary     Status: Abnormal   Collection Time: 03/21/24 11:24 AM  Result Value Ref Range   Glucose-Capillary 139 (H) 70 - 99 mg/dL    I have reviewed pertinent nursing notes, vitals, labs, and images as necessary. I have ordered labwork to follow up on as indicated.  I have reviewed the last notes from staff over past 24 hours. I have discussed patient's care plan and test results with nursing staff, CM/SW, and other staff as appropriate.  Old records reviewed in assessment of this patient  Time spent: Greater than 50% of the 55 minute visit was spent in counseling/coordination of care for the patient as laid out in the A&P.   LOS: 6 days   Alm Apo, MD [facility] 03/21/2024, 12:21 PM

## 2024-03-21 NOTE — Plan of Care (Signed)

## 2024-03-21 NOTE — Plan of Care (Signed)
  Problem: Education: Goal: Knowledge of General Education information will improve Description: Including pain rating scale, medication(s)/side effects and non-pharmacologic comfort measures Outcome: Progressing   Problem: Health Behavior/Discharge Planning: Goal: Ability to manage health-related needs will improve Outcome: Progressing   Problem: Clinical Measurements: Goal: Ability to maintain clinical measurements within normal limits will improve Outcome: Progressing Goal: Will remain free from infection Outcome: Progressing   Problem: Activity: Goal: Risk for activity intolerance will decrease Outcome: Progressing   Problem: Nutrition: Goal: Adequate nutrition will be maintained Outcome: Progressing   Problem: Coping: Goal: Level of anxiety will decrease Outcome: Progressing   Problem: Elimination: Goal: Will not experience complications related to bowel motility Outcome: Progressing Goal: Will not experience complications related to urinary retention Outcome: Progressing

## 2024-03-21 NOTE — Progress Notes (Signed)
 No blood glucose check this evening. Patient said she would rather not get blood glucose checks anymore, saying she feels there is no sense to it. She said her A1C came back at 5.2 and that initially blood sugar was high but she had been on prednisone .

## 2024-03-21 NOTE — Assessment & Plan Note (Addendum)
-   very questionable contrast allergy noted 02/2011; no documentation of reaction and patient has no recollection of anything either; also had CTA chest PE workup in 2016 - she is okay with trial of contrast for CT and forgoing any pre-meds - She successfully tolerated CT A/P with contrast on 03/20/2024 -Contrast allergy deleted as ambiguous entry of unclear etiology and has tolerated 2 CAT scans since allergy posted; patient in agreement with deletion as well

## 2024-03-21 NOTE — Progress Notes (Signed)
 Progress Note  Patient Name: [name] [name] Date of Encounter: 03/21/2024  [facility] Cardiologist: None   Patient Profile     Subjective   Net -2 L yesterday, -4.4 L on admission.  Creatinine stable at 0.99.  BP 111/75.  Denies any chest pain or dyspnea  Inpatient Medications    Scheduled Meds:  amiodarone   200 mg Oral BID   apixaban   5 mg Oral BID   carvedilol   6.25 mg Oral BID WC   Chlorhexidine  Gluconate Cloth  6 each Topical Daily   feeding supplement  237 mL Oral BID BM   furosemide   40 mg Intravenous BID   insulin  aspart  0-6 Units Subcutaneous TID AC & HS   levothyroxine   112 mcg Oral QHS   pravastatin   20 mg Oral q1800   sodium chloride  flush  3 mL Intravenous Q12H   spironolactone   25 mg Oral Daily   Continuous Infusions:   PRN Meds: acetaminophen , clonazePAM , docusate sodium , ondansetron  (ZOFRAN ) IV, mouth rinse, oxyCODONE , polyethylene glycol, sodium chloride  flush   Vital Signs    Vitals:   03/20/24 1942 03/20/24 2356 03/21/24 0611 03/21/24 0741  BP: 105/72 (!) 109/58 (!) 99/55 111/75  Pulse: 84 69 76 70  Resp: 12 15 16 20   Temp: 97.9 F (36.6 C) 97.9 F (36.6 C) 98.1 F (36.7 C) 97.8 F (36.6 C)  TempSrc: Oral Oral Oral Oral  SpO2: 95%   94%  Weight:    80.9 kg  Height:        Intake/Output Summary (Last 24 hours) at 03/21/2024 0848 Last data filed at 03/20/2024 2357 Gross per 24 hour  Intake 240 ml  Output 2250 ml  Net -2010 ml      03/21/2024    7:41 AM 03/20/2024    6:09 AM 03/19/2024    3:57 AM  Last 3 Weights  Weight (lbs) 178 lb 6.4 oz 179 lb 7.3 oz 182 lb  Weight (kg) 80.922 kg 81.4 kg 82.555 kg      Telemetry     aflutter with BiV paced rhythm- Personally Reviewed  ECG    No new EKG to review- Personally Reviewed  Physical Exam   GEN: in no acute distress HEENT: Normal NECK: No JVD; No carotid bruits CARDIAC:RRR, no murmurs, rubs, gallops RESPIRATORY:  Clear to auscultation without rales, wheezing or rhonchi   ABDOMEN: Soft, non-tender, non-distended MUSCULOSKELETAL:  No edema; No deformity  SKIN: Warm and dry NEUROLOGIC:  Alert and oriented x 3 PSYCHIATRIC:  Normal affect  Labs    High Sensitivity Troponin:   Recent Labs  Lab 03/15/24 0836 03/15/24 1022  TROPONINIHS 24* 21*      Chemistry Recent Labs  Lab 03/15/24 1928 03/16/24 0250 03/19/24 0155 03/20/24 0319 03/21/24 0322  NA 120*   < > 123* 131* 131*  K  --    < > 3.5 3.8 4.0  CL  --    < > 85* 89* 89*  CO2  --    < > 24 28 32  GLUCOSE  --    < > 289* 95 118*  BUN  --    < > 21 23 23   CREATININE  --    < > 0.90 0.92 0.99  CALCIUM   --    < > 9.2 9.2 9.2  PROT 6.7  --   --   --   --   GFRNONAA  --    < > >60 >60 >60  ANIONGAP  --    < >  14 15 10    < > = values in this interval not displayed.     Hematology Recent Labs  Lab 03/19/24 0155 03/20/24 0319 03/21/24 0322  WBC 12.5* 9.0 9.7  RBC 3.85* 4.25 4.34  HGB 11.9* 12.9 13.2  HCT 33.8* 37.7 38.3  MCV 87.8 88.7 88.2  MCH 30.9 30.4 30.4  MCHC 35.2 34.2 34.5  RDW 13.0 13.3 13.4  PLT 275 317 315    BNP Recent Labs  Lab 03/15/24 0836 03/16/24 1120  BNP 741.8* 502.6*     DDimer No results for input(s): DDIMER in the last 168 hours.   CHA2DS2-VASc Score = 5   This indicates a 7.2% annual risk of stroke. The patient's score is based upon: CHF History: 1 HTN History: 1 Diabetes History: 0 Stroke History: 0 Vascular Disease History: 1 Age Score: 1 Gender Score: 1      Radiology    CT ABDOMEN PELVIS W WO CONTRAST Result Date: 03/20/2024 CLINICAL DATA:  Hematuria EXAM: CT ABDOMEN AND PELVIS WITHOUT AND WITH CONTRAST TECHNIQUE: Multidetector CT imaging of the abdomen and pelvis was performed following the standard protocol before and following the bolus administration of intravenous contrast. RADIATION DOSE REDUCTION: This exam was performed according to the departmental dose-optimization program which includes automated exposure control, adjustment  of the mA and/or kV according to patient size and/or use of iterative reconstruction technique. CONTRAST:  OMNIPAQUE  IOHEXOL  350 MG/ML SOLN COMPARISON:  11/16/2013 FINDINGS: Lower chest: There is a small partially loculated left pleural effusion with underlying left lower lobe consolidation likely representing atelectasis. Right lung bases clear. Trace pericardial fluid. Multi lead cardiac pacer and cardiomegaly are noted. Hepatobiliary: No focal liver abnormality is seen. No gallstones, gallbladder wall thickening, or biliary dilatation. Pancreas: Unremarkable. No pancreatic ductal dilatation or surrounding inflammatory changes. Spleen: Normal in size without focal abnormality. Adrenals/Urinary Tract: No urinary tract calculi or obstructive uropathy within either kidney. Following contrast administration, multiple simple right renal cortical cysts are identified which do not require specific imaging follow-up. There is no abnormal renal parenchymal enhancement. Minimal bilateral renal cortical scarring is noted, right greater than left. Delayed imaging demonstrates normal excretion of contrast into unremarkable bilateral pelvocaliceal structures. There are no filling defects or mucosal irregularities. The adrenals are unremarkable. Bladder is minimally distended, with no filling defects or mucosal irregularities. Stomach/Bowel: No bowel obstruction or ileus. The appendix, if still present, is not well visualized. Scattered sigmoid diverticulosis without diverticulitis. There is short segment wall thickening within the mid transverse colon, reference image 39/8, which is evident to varying degrees on all 3 phases of the exam. While this could reflect peristalsis, correlation with colonoscopy is recommended if not recently performed to exclude underlying colonic mass. Vascular/Lymphatic: Aortic atherosclerosis. No enlarged abdominal or pelvic lymph nodes. Reproductive: Status post hysterectomy. No adnexal  masses. Other: No free fluid or free intraperitoneal gas. Postsurgical changes from prior ventral hernia repair. Musculoskeletal: Right hip arthroplasty. No acute or destructive bony abnormalities. Reconstructed images demonstrate no additional findings. IMPRESSION: 1. No etiology identified to explain the patient's reported hematuria. If hematuria remains unexplained, cystoscopy may be useful. 2. Indeterminate area of short segment mural thickening within the mid transverse colon, which may be due to peristalsis. Correlation with colonoscopy is recommended if not recently performed. 3. Small partially loculated left pleural effusion with underlying left lower lobe atelectasis. 4. Sigmoid diverticulosis without diverticulitis. 5.  Aortic Atherosclerosis (ICD10-I70.0). Electronically Signed   By: Ozell Daring M.D.   On: 03/20/2024 13:42  Patient Profile     68 y.o. female with a hx of CHB s/p ICD, nonischemic cardiomyopathy, chronic HFrEF, persistent A-Fib s/p recent DCCV 03/07/2024, PVD, aortic and coronary calcifications per CT, hypertension, hypothyrdoism who is being seen 03/15/2024 for the evaluation of CHF at the request of Dr. Harold.   Assessment & Plan    68 y.o. female with a hx of CHB s/p ICD, nonischemic cardiomyopathy, chronic HFrEF, persistent A-Fib s/p recent DCCV 03/07/2024, PVD, aortic and coronary calcifications per CT, hypertension, hypothyrdoism who is being seen for the evaluation of CHF   Acute on chronic combined heart failure: Most recent echo in 2023 showed EF 30 to 35%.  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  Underwent left thoracotomy for LV epicardial lead placement 01/2024.  Had recurrent A-fib and underwent cardioversion 03/07/2024.  Presented this admission with 4-week history of worsening shortness of breath.  Labs showed mildly elevated BNP, sodium 118.  She is back in A-fib.  Underwent thoracentesis with 1.3 L removed.  Echo  this admission showed EF 20 to 25%.  LHC was normal coronary arteries and elevated pressures (RA 16, RV 45/6, PA 43/26/33, PCWP cannot be obtained, LVEDP 23, CI 2.8) - Appears euvolemic on exam, she received IV Lasix  this morning, will transition to p.o. Lasix  tomorrow -Could not tolerate Entresto  due to hypotension -Intolerant to SGLT2 inhibitor -Continue carvedilol  25 mg twice daily, spironolactone  12.5 mg daily -Likely benefit from restoration of sinus rhythm but need to hold off on cardioversion for now as anticoagulation was held due to hematuria.  Hematuria has resolved and she is now back on Eliquis .  Can likely plan cardioversion as outpatient if no further bleeding seen  Persistent atrial fibrillation: Underwent DCCV 03/07/2024.  She is back in atrial flutter this admission.  EP evaluated and recommended starting amiodarone  and plan for ablation in early 2026.  Continue amiodarone  200 mg twice daily.  Anticoagulation was on hold due to hematuria, which has resolved and she is now back on Eliquis , restarted 12/7 PM dose.  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  Status post failed CRT upgrade 01/2024 with subsequent placement of RV ICD lead and then LV epicardial lead placement 02/06/2024  Hematuria: Reports has resolved, now back on Eliquis   Hyponatremia: Sodium 119 on admission.  Has improved with diuresis, 131 this morning  For questions or updates, please contact [facility] Please consult [email or web address] for contact info under        Signed, Lonni LITTIE Nanas, MD  03/21/2024, 8:48 AM

## 2024-03-22 ENCOUNTER — Other Ambulatory Visit (HOSPITAL_COMMUNITY): Payer: Self-pay

## 2024-03-22 LAB — BASIC METABOLIC PANEL WITH GFR
Anion gap: 10 (ref 5–15)
BUN: 23 mg/dL (ref 8–23)
CO2: 28 mmol/L (ref 22–32)
Calcium: 8.9 mg/dL (ref 8.9–10.3)
Chloride: 93 mmol/L — ABNORMAL LOW (ref 98–111)
Creatinine, Ser: 1 mg/dL (ref 0.44–1.00)
GFR, Estimated: >60 mL/min (ref >60)
Glucose, Bld: 125 mg/dL — ABNORMAL HIGH (ref 70–99)
Potassium: 4 mmol/L (ref 3.5–5.1)
Sodium: 131 mmol/L — ABNORMAL LOW (ref 135–145)

## 2024-03-22 LAB — MAGNESIUM: Magnesium: 2.1 mg/dL (ref 1.7–2.4)

## 2024-03-22 MED ORDER — AMIODARONE HCL 200 MG PO TABS
ORAL_TABLET | ORAL | 0 refills | Status: DC
  Filled 2024-03-22: qty 90, 80d supply, fill #0

## 2024-03-22 MED ORDER — FUROSEMIDE 40 MG PO TABS
40.0000 mg | ORAL_TABLET | Freq: Every day | ORAL | 0 refills | Status: DC
  Filled 2024-03-22: qty 30, 30d supply, fill #0

## 2024-03-22 NOTE — Progress Notes (Signed)
Patient provided with verbal discharge instructions. Paper copy of discharge provided to patient. RN answered all questions. VSS at discharge. IV removed. Patient belongings sent with patient. Patient dc'd via wheelchair to private vehicle  

## 2024-03-22 NOTE — Plan of Care (Signed)
°  Problem: Education: °Goal: Knowledge of General Education information will improve °Description: Including pain rating scale, medication(s)/side effects and non-pharmacologic comfort measures °Outcome: Progressing °  °Problem: Clinical Measurements: °Goal: Cardiovascular complication will be avoided °Outcome: Progressing °  °Problem: Activity: °Goal: Risk for activity intolerance will decrease °Outcome: Progressing °  °

## 2024-03-22 NOTE — Progress Notes (Signed)
Patient provided with verbal discharge instructions. Paper copy of discharge provided to patient. RN answered all questions. VSS at discharge. IV's removed. Patient belongings sent with patient. Patient dc'd via wheelchair to private vehicle.

## 2024-03-22 NOTE — Progress Notes (Signed)
 Progress Note  Patient Name: [name] [name] Date of Encounter: 03/22/2024  [facility] Cardiologist: None   Patient Profile     Subjective   Creatinine stable at 1.0.  BP 124/74.  Denies any chest pain or dyspnea  Inpatient Medications    Scheduled Meds:  amiodarone   200 mg Oral BID   apixaban   5 mg Oral BID   carvedilol   6.25 mg Oral BID WC   Chlorhexidine  Gluconate Cloth  6 each Topical Daily   feeding supplement  237 mL Oral BID BM   furosemide   40 mg Oral Daily   insulin  aspart  0-6 Units Subcutaneous TID AC & HS   levothyroxine   112 mcg Oral QHS   pravastatin   20 mg Oral q1800   sodium chloride  flush  3 mL Intravenous Q12H   spironolactone   25 mg Oral Daily   Continuous Infusions:   PRN Meds: acetaminophen , clonazePAM , docusate sodium , ondansetron  (ZOFRAN ) IV, mouth rinse, oxyCODONE , polyethylene glycol, sodium chloride  flush   Vital Signs    Vitals:   03/21/24 1935 03/21/24 2334 03/22/24 0551 03/22/24 0726  BP: 126/75 (!) 114/56  124/74  Pulse:  69  70  Resp: 20 19  15   Temp: 97.8 F (36.6 C) 97.9 F (36.6 C)  98.2 F (36.8 C)  TempSrc: Oral Oral  Oral  SpO2: 98%   97%  Weight:   80.2 kg   Height:        Intake/Output Summary (Last 24 hours) at 03/22/2024 0839 Last data filed at 03/22/2024 0758 Gross per 24 hour  Intake 240 ml  Output 1150 ml  Net -910 ml      03/22/2024    5:51 AM 03/21/2024    7:41 AM 03/20/2024    6:09 AM  Last 3 Weights  Weight (lbs) 176 lb 12.9 oz 178 lb 6.4 oz 179 lb 7.3 oz  Weight (kg) 80.2 kg 80.922 kg 81.4 kg      Telemetry     aflutter with BiV paced rhythm, rate 70s to 80s- Personally Reviewed  ECG    No new EKG to review- Personally Reviewed  Physical Exam   GEN: in no acute distress HEENT: Normal NECK: No JVD; No carotid bruits CARDIAC:RRR, no murmurs, rubs, gallops RESPIRATORY:  Clear to auscultation without rales, wheezing or rhonchi  ABDOMEN: Soft, non-tender, non-distended MUSCULOSKELETAL:   No edema; No deformity  SKIN: Warm and dry NEUROLOGIC:  Alert and oriented x 3 PSYCHIATRIC:  Normal affect  Labs    High Sensitivity Troponin:   Recent Labs  Lab 03/15/24 0836 03/15/24 1022  TROPONINIHS 24* 21*      Chemistry Recent Labs  Lab 03/15/24 1928 03/16/24 0250 03/20/24 0319 03/21/24 0322 03/22/24 0222  NA 120*   < > 131* 131* 131*  K  --    < > 3.8 4.0 4.0  CL  --    < > 89* 89* 93*  CO2  --    < > 28 32 28  GLUCOSE  --    < > 95 118* 125*  BUN  --    < > 23 23 23   CREATININE  --    < > 0.92 0.99 1.00  CALCIUM   --    < > 9.2 9.2 8.9  PROT 6.7  --   --   --   --   GFRNONAA  --    < > >60 >60 >60  ANIONGAP  --    < >  15 10 10    < > = values in this interval not displayed.     Hematology Recent Labs  Lab 03/19/24 0155 03/20/24 0319 03/21/24 0322  WBC 12.5* 9.0 9.7  RBC 3.85* 4.25 4.34  HGB 11.9* 12.9 13.2  HCT 33.8* 37.7 38.3  MCV 87.8 88.7 88.2  MCH 30.9 30.4 30.4  MCHC 35.2 34.2 34.5  RDW 13.0 13.3 13.4  PLT 275 317 315    BNP Recent Labs  Lab 03/16/24 1120  BNP 502.6*     DDimer No results for input(s): DDIMER in the last 168 hours.   CHA2DS2-VASc Score = 5   This indicates a 7.2% annual risk of stroke. The patient's score is based upon: CHF History: 1 HTN History: 1 Diabetes History: 0 Stroke History: 0 Vascular Disease History: 1 Age Score: 1 Gender Score: 1      Radiology    CT ABDOMEN PELVIS W WO CONTRAST Result Date: 03/20/2024 CLINICAL DATA:  Hematuria EXAM: CT ABDOMEN AND PELVIS WITHOUT AND WITH CONTRAST TECHNIQUE: Multidetector CT imaging of the abdomen and pelvis was performed following the standard protocol before and following the bolus administration of intravenous contrast. RADIATION DOSE REDUCTION: This exam was performed according to the departmental dose-optimization program which includes automated exposure control, adjustment of the mA and/or kV according to patient size and/or use of iterative  reconstruction technique. CONTRAST:  OMNIPAQUE  IOHEXOL  350 MG/ML SOLN COMPARISON:  11/16/2013 FINDINGS: Lower chest: There is a small partially loculated left pleural effusion with underlying left lower lobe consolidation likely representing atelectasis. Right lung bases clear. Trace pericardial fluid. Multi lead cardiac pacer and cardiomegaly are noted. Hepatobiliary: No focal liver abnormality is seen. No gallstones, gallbladder wall thickening, or biliary dilatation. Pancreas: Unremarkable. No pancreatic ductal dilatation or surrounding inflammatory changes. Spleen: Normal in size without focal abnormality. Adrenals/Urinary Tract: No urinary tract calculi or obstructive uropathy within either kidney. Following contrast administration, multiple simple right renal cortical cysts are identified which do not require specific imaging follow-up. There is no abnormal renal parenchymal enhancement. Minimal bilateral renal cortical scarring is noted, right greater than left. Delayed imaging demonstrates normal excretion of contrast into unremarkable bilateral pelvocaliceal structures. There are no filling defects or mucosal irregularities. The adrenals are unremarkable. Bladder is minimally distended, with no filling defects or mucosal irregularities. Stomach/Bowel: No bowel obstruction or ileus. The appendix, if still present, is not well visualized. Scattered sigmoid diverticulosis without diverticulitis. There is short segment wall thickening within the mid transverse colon, reference image 39/8, which is evident to varying degrees on all 3 phases of the exam. While this could reflect peristalsis, correlation with colonoscopy is recommended if not recently performed to exclude underlying colonic mass. Vascular/Lymphatic: Aortic atherosclerosis. No enlarged abdominal or pelvic lymph nodes. Reproductive: Status post hysterectomy. No adnexal masses. Other: No free fluid or free intraperitoneal gas. Postsurgical  changes from prior ventral hernia repair. Musculoskeletal: Right hip arthroplasty. No acute or destructive bony abnormalities. Reconstructed images demonstrate no additional findings. IMPRESSION: 1. No etiology identified to explain the patient's reported hematuria. If hematuria remains unexplained, cystoscopy may be useful. 2. Indeterminate area of short segment mural thickening within the mid transverse colon, which may be due to peristalsis. Correlation with colonoscopy is recommended if not recently performed. 3. Small partially loculated left pleural effusion with underlying left lower lobe atelectasis. 4. Sigmoid diverticulosis without diverticulitis. 5.  Aortic Atherosclerosis (ICD10-I70.0). Electronically Signed   By: Ozell Daring M.D.   On: 03/20/2024 13:42  Patient Profile     68 y.o. female with a hx of CHB s/p ICD, nonischemic cardiomyopathy, chronic HFrEF, persistent A-Fib s/p recent DCCV 03/07/2024, PVD, aortic and coronary calcifications per CT, hypertension, hypothyrdoism who is being seen 03/15/2024 for the evaluation of CHF at the request of Dr. Harold.   Assessment & Plan    68 y.o. female with a hx of CHB s/p ICD, nonischemic cardiomyopathy, chronic HFrEF, persistent A-Fib s/p recent DCCV 03/07/2024, PVD, aortic and coronary calcifications per CT, hypertension, hypothyrdoism who is being seen for the evaluation of CHF   Acute on chronic combined heart failure: Most recent echo in 2023 showed EF 30 to 35%.  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  Underwent left thoracotomy for LV epicardial lead placement 01/2024.  Had recurrent A-fib and underwent cardioversion 03/07/2024.  Presented this admission with 4-week history of worsening shortness of breath.  Labs showed mildly elevated BNP, sodium 118.  She is back in A-fib.  Underwent thoracentesis with 1.3 L removed.  Echo this admission showed EF 20 to 25%.  LHC was normal coronary arteries and  elevated pressures (RA 16, RV 45/6, PA 43/26/33, PCWP cannot be obtained, LVEDP 23, CI 2.8) - Appears euvolemic on exam, transition to p.o. Lasix  today -Could not tolerate Entresto  due to hypotension -Intolerant to SGLT2 inhibitor -Continue carvedilol  6.25 mg twice daily, spironolactone  12.5 mg daily -Likely benefit from restoration of sinus rhythm but need to hold off on cardioversion for now as anticoagulation was held due to hematuria.  Hematuria has resolved and she is now back on Eliquis .  Can plan cardioversion as outpatient if no further bleeding seen and after loading with amiodarone   Persistent atrial fibrillation: Underwent DCCV 03/07/2024.  She is back in atrial flutter this admission.  EP evaluated and recommended starting amiodarone  and plan for ablation in early 2026.  Continue amiodarone  200 mg twice daily x 1 week then decrease to 200 mg daily.  Anticoagulation was on hold due to hematuria, which has resolved and she is now back on Eliquis , restarted 12/7 PM dose.  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  Status post failed CRT upgrade 01/2024 with subsequent placement of RV ICD lead and then LV epicardial lead placement 02/06/2024  Hematuria: Reports has resolved, now back on Eliquis   Hyponatremia: Sodium 119 on admission.  Has improved with diuresis, 131 this morning  [facility] will sign off.   Medication Recommendations: Eliquis  5 mg twice daily, amiodarone  200 mg twice daily x 1 week then decrease to 200 mg daily, Lasix  40 mg daily, spironolactone  25 mg daily, carvedilol  6.25 mg twice daily Other recommendations (labs, testing, etc): BMET in 1 week Follow up as an outpatient: Has follow-up appointment scheduled on 12/16.  Scheduled with EP on 12/30   For questions or updates, please contact [facility] Please consult [email or web address]  for contact info under        Signed, Lonni LITTIE Nanas, MD  03/22/2024, 8:39 AM

## 2024-03-22 NOTE — Plan of Care (Signed)
  Problem: Education: Goal: Knowledge of General Education information will improve Description: Including pain rating scale, medication(s)/side effects and non-pharmacologic comfort measures Outcome: Progressing   Problem: Health Behavior/Discharge Planning: Goal: Ability to manage health-related needs will improve Outcome: Progressing   Problem: Clinical Measurements: Goal: Ability to maintain clinical measurements within normal limits will improve Outcome: Progressing Goal: Respiratory complications will improve Outcome: Progressing   Problem: Nutrition: Goal: Adequate nutrition will be maintained Outcome: Progressing   Problem: Coping: Goal: Level of anxiety will decrease Outcome: Progressing   Problem: Safety: Goal: Ability to remain free from injury will improve Outcome: Progressing   Problem: Skin Integrity: Goal: Risk for impaired skin integrity will decrease Outcome: Progressing

## 2024-03-22 NOTE — Discharge Summary (Signed)
 Physician Discharge Summary  [name] [name] FMW:[id number] DOB: 10-14-1955 DOA: 03/15/2024  PCP: Sherial Bail, MD  Admit date: 03/15/2024 Discharge date: 03/22/2024 Recommendations for Outpatient Follow-up:  Follow up with PCP in 1 weeks-call for appointment Please obtain BMP/CBC in one week  Discharge Dispo: Home Discharge Condition: Stable Code Status:   Code Status: Full Code Diet recommendation:  Diet Order             Diet Heart Room service appropriate? Yes; Fluid consistency: Thin  Diet effective now                    Brief/Interim Summary: [name] is a 68 yo female with PMH afib, CHB s/p PPM, HTN, HLD, sCHF, anxiety, GERD, gout, hypothyroid, PAD s/p iliac stent who initially presented on 12/2 with SOB lasting about 4 to 5 days with progressive worsening. There was report of med compliance but increase in water  intake prior to admission. On workup she was found to have hyponatremia, 119.  Initially admitted to the ICU. Cardiology and EP were consulted as well. She was started on lasix  and underwent further workup.  Echo 03/16/2024: EF 20 to 25%, global hypokinesis, severe LVH, grade 3 DD.  Apical akinesis and swirling of contrast concerning for early thrombus formation.  s/p PPM historically and recent left anterolateral thoracotomy for insertion of 2 left ventricular epicardial pacing leads and revision of BiV pacing system on 02/05/2024 s/p R/L heart cath on 12/5; no significant disease; elevated filling pressures including RA, severe biventricular failure, moderate pulmonary hypertension. Cardiology managing-could not tolerate Entresto , intolerant to SGLT 2 I.  Continue carvedilol , Aldactone . At this time volume status improved sodium is stabilized, plans for discharge home on diuretics Aldactone  Coreg  amiodarone  and outpatient BMP check in 1 week, patient is doing well and eager to go home this morning  Subjective: Seen and examined today Feels well on  RA Overnight afebrile, VSS, Labs stable electrolytes Eager ot go home this am Cardio called and cleared for dc  Discharge Diagnoses:   Hyponatremia syndrome Sodium 119 on admission.  Previously normal in November. Etiology on admission was due to CHF for now is being continued on lasix  and aldactone , esp given echo findings Na 131 remained stable tolerating diuretics   Acute on chronic combined systolic and diastolic CHF: Echo 03/16/2024: EF 20 to 25%, global hypokinesis, severe LVH, grade 3 DD.  Apical akinesis and swirling of contrast concerning for early thrombus formation.  s/p PPM historically and recent left anterolateral thoracotomy for insertion of 2 left ventricular epicardial pacing leads and revision of BiV pacing system on 02/05/2024 s/p R/L heart cath on 12/5; no significant disease; elevated filling pressures including RA, severe biventricular failure, moderate pulmonary hypertension. Cardiology managing-could not tolerate Entresto , intolerant to SGLT 2 I.  Continue carvedilol , Aldactone . Medication Recommendations: Eliquis  5 mg twice daily, amiodarone  200 mg twice daily x 1 week then decrease to 200 mg daily, Lasix  40 mg daily, spironolactone  25 mg daily, carvedilol  6.25 mg twice daily Other recommendations (labs, testing, etc): BMET in 1 week Follow up as an outpatient: Has follow-up appointment scheduled on 12/16.  Scheduled with EP on 12/30    Recurrent left pleural effusion left effusion; appears started after thoracotomy on 02/05/2024 but has improved some with diuresis since then however recurring. Underwent left thoracentesis in the ICU on 03/15/2024 with 1.3 L fluid drained exudative by protein; transudative by LDH. given unilateral, differential includes infectious vs CHF vs ?complication from thoracotomy since new  onset after procedure (had chest tube 10/24>>10/26) Continue diuretics as above.  Culture no growth add on to prior thoracentesis fluid: pH (in process),  cell count (505 total nuc cells), and culture (no organisms). She has fu with pulmonology   Persistent atrial fibrillation Atrial flutter Transitioned to oral amiodarone  on 12/6 per cardiology Heparin  drip being converted back to Eliquis  on 03/18/2024 given no further procedures Due to development of hematuria on 12/7, Eliquis  held No further hematuria and workup negative, Eliquis  resumed evening of 03/20/2024 continue coreg .  Likely needs cardioversion as outpatient per EP:EP follow up will be arranged for ~ 3 weeks to reassess rhythm plan for DCCV if needed ~ Jan-Feb with Dr. Kennyth to look towards timing of ablation   Hematuria No prior similar history -Developed overnight of 12/6 into 12/7; does feel a little bit of burning with urination -UA negative for infection -Eliquis  held -last dose evening of 03/19/2024.Discussed with urology as well.  CT A/P requested and negative for evidence of hematuria etiology.Reviewed CT with Dr. Nieves; hematuria felt to be clinically insignificant and she is recommended to follow-up outpatient for cystoscopy.  Patient understands plan and findings of CT reviewed as well; information added to AVS Eliquis  resumed evening of 03/20/2024.  No further hematuria   AKI- resolved as of 03/20/2024 baseline creatinine ~ 0.8 - 0.9 patient presents with increase in creat >0.3 mg/dL above baseline or creat increase >1.5x baseline presumed to have occurred within past 7 days PTA.  presumed CRS. stable and improved on diuresis for now  Hypothyroidism Continue Synthroid    Heart block AV complete  see CHF; now has PPM and ICD   Pure hypercholesterolemia continue pravastatin    Primary hypertension soft pressures.  Tolerating GDMT as above   History of allergy to radiographic contrast media-resolved as of 03/21/2024 very questionable contrast allergy noted 02/2011; no documentation of reaction and patient has no recollection of anything either; also had CTA chest PE  workup in 2016 - she is okay with trial of contrast for CT and forgoing any pre-meds - She successfully tolerated CT A/P with contrast on 03/20/2024 -Contrast allergy deleted as ambiguous entry of unclear etiology and has tolerated 2 CAT scans since allergy posted; patient in agreement with deletion as well   Class I Obesity w/ Body mass index is 30.35 kg/m.: Will benefit with PCP follow-up, weight loss,healthy lifestyle and outpatient sleep eval if not done.    DVT prophylaxis: Place and maintain sequential compression device Start: 03/20/24 0728 SCD's Start: 03/18/24 0902 Eliquis  Code Status:   Code Status: Full Code Family Communication: plan of care discussed with patient at bedside. Patient status is: Remains hospitalized because of severity of illness Level of care: Telemetry   Dispo: The patient is from: home            Anticipated disposition: home Objective: Vitals last 24 hrs: Vitals:   03/21/24 1632 03/21/24 1935 03/21/24 2334 03/22/24 0551  BP: 111/73 126/75 (!) 114/56   Pulse: 70  69   Resp: 18 20 19    Temp: 97.9 F (36.6 C) 97.8 F (36.6 C) 97.9 F (36.6 C)   TempSrc: Oral Oral Oral   SpO2: 96% 98%    Weight:    80.2 kg  Height:        Physical Examination: General exam: alert awake, oriented, older than stated age HEENT:Oral mucosa moist, Ear/Nose WNL grossly Respiratory system: Bilaterally clear BS,no use of accessory muscle Cardiovascular system: S1 & S2 +, No JVD. Gastrointestinal  system: Abdomen soft,NT,ND, BS+ Nervous System: Alert, awake, moving all extremities,and following commands. Extremities: extremities warm, leg edema neg Skin: Warm, no rashes MSK: Normal muscle bulk,tone, power   Medications reviewed:  Scheduled Meds:  amiodarone   200 mg Oral BID   apixaban   5 mg Oral BID   carvedilol   6.25 mg Oral BID WC   Chlorhexidine  Gluconate Cloth  6 each Topical Daily   feeding supplement  237 mL Oral BID BM   furosemide   40 mg Oral Daily    insulin  aspart  0-6 Units Subcutaneous TID AC & HS   levothyroxine   112 mcg Oral QHS   pravastatin   20 mg Oral q1800   sodium chloride  flush  3 mL Intravenous Q12H   spironolactone   25 mg Oral Daily   Continuous Infusions: Diet: Diet Order             Diet Heart Room service appropriate? Yes; Fluid consistency: Thin  Diet effective now                      Procedure(s) (LRB): RIGHT/LEFT HEART CATH AND CORONARY ANGIOGRAPHY (N/A)  Consultation: See note.  Discharge Instructions  Discharge Instructions     (HEART FAILURE PATIENTS) Call MD:  Anytime you have any of the following symptoms: 1) 3 pound weight gain in 24 hours or 5 pounds in 1 week 2) shortness of breath, with or without a dry hacking cough 3) swelling in the hands, feet or stomach 4) if you have to sleep on extra pillows at night in order to breathe.   Complete by: As directed    Discharge instructions   Complete by: As directed    Please call call MD or return to ER for similar or worsening recurring problem that brought you to hospital or if any fever,nausea/vomiting,abdominal pain, uncontrolled pain, chest pain,  shortness of breath or any other alarming symptoms.  Please follow-up your doctor as instructed in a week time and call the office for appointment.  Please avoid alcohol, smoking, or any other illicit substance and maintain healthy habits including taking your regular medications as prescribed.  You were cared for by a hospitalist during your hospital stay. If you have any questions about your discharge medications or the care you received while you were in the hospital after you are discharged, you can call the unit and ask to speak with the hospitalist on call if the hospitalist that took care of you is not available.  Once you are discharged, your primary care physician will handle any further medical issues. Please note that NO REFILLS for any discharge medications will be authorized once you are  discharged, as it is imperative that you return to your primary care physician (or establish a relationship with a primary care physician if you do not have one) for your aftercare needs so that they can reassess your need for medications and monitor your lab values   Increase activity slowly   Complete by: As directed       Allergies as of 03/22/2024       Reactions   Marshmallow [althaea Officinalis]    Rash, hives, itching, SOB Pt states this is no longer an issue for her   Other    Jello - Rash, hives, itching, SOB Pt states is no longer an issue for her   Latex Rash   Nickel Rash        Medication List     TAKE these medications  acetaminophen  325 MG tablet Commonly known as: Tylenol  Take 2 tablets (650 mg total) by mouth every 6 (six) hours as needed for mild pain (pain score 1-3).   amiodarone  200 MG tablet Commonly known as: PACERONE  Continue taking 1 tablet (200 mg) by mouth twice a day until 12.12.25, then take 1 tablet (200mg ) by mouth once a day.   apixaban  5 MG Tabs tablet Commonly known as: ELIQUIS  Take 1 tablet (5 mg total) by mouth 2 (two) times daily.   carvedilol  6.25 MG tablet Commonly known as: COREG  Take 1 tablet (6.25 mg total) by mouth 2 (two) times daily.   clonazePAM  0.5 MG tablet Commonly known as: KLONOPIN  TAKE 1 TABLET BY MOUTH ONCE DAILY AS NEEDED FOR ANXIETY/ SLEEP   cyanocobalamin 1000 MCG tablet Commonly known as: VITAMIN B12 Take 1,000 mcg by mouth at bedtime.   furosemide  40 MG tablet Commonly known as: LASIX  Take 1 tablet (40 mg total) by mouth daily. What changed:  medication strength how much to take when to take this reasons to take this   levothyroxine  112 MCG tablet Commonly known as: SYNTHROID  Take 1 tablet by mouth once daily   lovastatin  20 MG tablet Commonly known as: MEVACOR  TAKE 1 TABLET BY MOUTH AT BEDTIME   oxyCODONE  5 MG immediate release tablet Commonly known as: Oxy IR/ROXICODONE  Take 1 tablet (5  mg total) by mouth every 6 (six) hours as needed for severe pain (pain score 7-10).   potassium chloride  10 MEQ tablet Commonly known as: KLOR-CON  M Take 1 tablet (10 mEq total) by mouth daily. What changed:  when to take this reasons to take this   spironolactone  25 MG tablet Commonly known as: ALDACTONE  Take 12.5 mg by mouth daily.   Vitamin D3 25 MCG (1000 UT) Caps Take 1,000 Units by mouth at bedtime.        Follow-up Information     Nieves Cough, MD. Schedule an appointment as soon as possible for a visit in 1 month(s).   Specialty: Urology Contact information: [address] [place] [address] [place] [address] [phone]         [facility] Follow up on 03/29/2024.   Specialty: Cardiology Why: at  1:30 Contact information: [address] [place]  [address] [phone]        Sherial Bail, MD Follow up in 1 week(s).   Specialty: Internal Medicine Contact information: [address] [place] [address] [phone]                Allergies  Allergen Reactions   Marshmallow [Althaea Officinalis]     Rash, hives, itching, SOB  Pt states this is no longer an issue for her   Other     Jello - Rash, hives, itching, SOB  Pt states is no longer an issue for her   Latex Rash   Nickel Rash    The results of significant diagnostics from this hospitalization (including imaging, microbiology, ancillary and laboratory) are listed below for reference.    Microbiology: Recent Results (from the past 240 hours)  Urine Culture     Status: Abnormal   Collection Time: 03/15/24 10:40 AM   Specimen: Urine, Clean Catch  Result Value Ref Range Status   Specimen Description URINE, CLEAN CATCH  Final   Special Requests   Final    NONE Performed at [facility], [address], [place], [place] [address]    Culture  MULTIPLE SPECIES PRESENT, SUGGEST RECOLLECTION (A)   Final   Report Status 03/16/2024 FINAL  Final  MRSA Next Gen by PCR, Nasal     Status: None   Collection Time: 03/15/24  2:26 PM   Specimen: Nasal Mucosa; Nasal Swab  Result Value Ref Range Status   MRSA by PCR Next Gen NOT DETECTED NOT DETECTED Final    Comment: (NOTE) The GeneXpert MRSA Assay (FDA approved for NASAL specimens only), is one component of a comprehensive MRSA colonization surveillance program. It is not intended to diagnose MRSA infection nor to guide or monitor treatment for MRSA infections. Test performance is not FDA approved in patients less than 36 years old. Performed at [facility], [address] [place], [place] [address]   Body fluid culture w Gram Stain     Status: None   Collection Time: 03/17/24  4:17 PM   Specimen: Pleura; Body Fluid  Result Value Ref Range Status   Specimen Description PLEURAL  Final   Special Requests NONE  Final   Gram Stain NO WBC SEEN NO ORGANISMS SEEN   Final   Culture   Final    NO GROWTH 3 DAYS Performed at [facility], [address] [place], [place] [address]    Report Status 03/20/2024 FINAL  Final    Procedures/Studies: CT ABDOMEN PELVIS W WO CONTRAST Result Date: 03/20/2024 CLINICAL DATA:  Hematuria EXAM: CT ABDOMEN AND PELVIS WITHOUT AND WITH CONTRAST TECHNIQUE: Multidetector CT imaging of the abdomen and pelvis was performed following the standard protocol before and following the bolus administration of intravenous contrast. RADIATION DOSE REDUCTION: This exam was performed according to the departmental dose-optimization program which includes automated exposure control, adjustment of the mA and/or kV according to patient size and/or use of iterative reconstruction technique. CONTRAST:  OMNIPAQUE  IOHEXOL  350 MG/ML SOLN COMPARISON:  11/16/2013 FINDINGS: Lower chest: There is a small partially loculated left pleural effusion with underlying left lower lobe consolidation likely representing atelectasis.  Right lung bases clear. Trace pericardial fluid. Multi lead cardiac pacer and cardiomegaly are noted. Hepatobiliary: No focal liver abnormality is seen. No gallstones, gallbladder wall thickening, or biliary dilatation. Pancreas: Unremarkable. No pancreatic ductal dilatation or surrounding inflammatory changes. Spleen: Normal in size without focal abnormality. Adrenals/Urinary Tract: No urinary tract calculi or obstructive uropathy within either kidney. Following contrast administration, multiple simple right renal cortical cysts are identified which do not require specific imaging follow-up. There is no abnormal renal parenchymal enhancement. Minimal bilateral renal cortical scarring is noted, right greater than left. Delayed imaging demonstrates normal excretion of contrast into unremarkable bilateral pelvocaliceal structures. There are no filling defects or mucosal irregularities. The adrenals are unremarkable. Bladder is minimally distended, with no filling defects or mucosal irregularities. Stomach/Bowel: No bowel obstruction or ileus. The appendix, if still present, is not well visualized. Scattered sigmoid diverticulosis without diverticulitis. There is short segment wall thickening within the mid transverse colon, reference image 39/8, which is evident to varying degrees on all 3 phases of the exam. While this could reflect peristalsis, correlation with colonoscopy is recommended if not recently performed to exclude underlying colonic mass. Vascular/Lymphatic: Aortic atherosclerosis. No enlarged abdominal or pelvic lymph nodes. Reproductive: Status post hysterectomy. No adnexal masses. Other: No free fluid or free intraperitoneal gas. Postsurgical changes from prior ventral hernia repair. Musculoskeletal: Right hip arthroplasty. No acute or destructive bony abnormalities. Reconstructed images demonstrate no additional findings. IMPRESSION: 1. No etiology identified to explain the patient's reported  hematuria.  If hematuria remains unexplained, cystoscopy may be useful. 2. Indeterminate area of short segment mural thickening within the mid transverse colon, which may be due to peristalsis. Correlation with colonoscopy is recommended if not recently performed. 3. Small partially loculated left pleural effusion with underlying left lower lobe atelectasis. 4. Sigmoid diverticulosis without diverticulitis. 5.  Aortic Atherosclerosis (ICD10-I70.0). Electronically Signed   By: Ozell Daring M.D.   On: 03/20/2024 13:42   CARDIAC CATHETERIZATION Result Date: 03/18/2024 Coronary angiography 03/18/2024: LM: Normal LAD: Normal, no significant disease Lcx: Normal, no significant disease RCA: Dominant. Normal, no significant disease LVEDP 23 mmHg Right heart catheterization 03/18/2024: RA: 16 mmHg RV: 45/6 mmHg PA: 43/26 mmHg, mPAP 33 mmHg PCW: could not ob obtained PAPi 1.0 AO sats: 93% PA sats: 64% CO: 5.2 L/min CI: 2.8 L/min/m2 Conclusion: Non coronary artery disease Elevated filling pressures, including RA Severe biventricular failure Decompensated nonischemic cardiomyopathy Moderate pulmonary hypertension. WHOP Grp II Recommendation: Continue diuresis and GDNT for HFrEF Okay to resume Eliquis  03/18/2024 morning, unless any other procedures planned [name], MD   DG CHEST PORT 1 VIEW Result Date: 03/17/2024 CLINICAL DATA:  Shortness of breath. EXAM: PORTABLE CHEST 1 VIEW COMPARISON:  03/16/2024 FINDINGS: Stable enlarged cardiac silhouette, moderate-sized left pleural effusion and left basilar atelectasis. Clear right lung. Stable mild diffuse peribronchial thickening. Stable left subclavian pacer and AICD leads. Tortuous and partially calcified thoracic aorta. Unremarkable bones. IMPRESSION: 1. Stable moderate-sized left pleural effusion and left basilar atelectasis. 2. Stable cardiomegaly. 3. Stable mild chronic bronchitic changes. Electronically Signed   By: Elspeth Bathe M.D.   On: 03/17/2024 13:42    DG CHEST PORT 1 VIEW Result Date: 03/16/2024 EXAM: 1 VIEW(S) XRAY OF THE CHEST 03/16/2024 12:24:00 PM COMPARISON: 03/15/2024. CLINICAL HISTORY: Pleural effusion. FINDINGS: LINES, TUBES AND DEVICES: Left AICD remains in place, unchanged. LUNGS AND PLEURA: Small to moderate left pleural effusion with left lower lobe atelectasis or infiltrate, similar to prior study. No pneumothorax. HEART AND MEDIASTINUM: Cardiomegaly. BONES AND SOFT TISSUES: No acute osseous abnormality. IMPRESSION: 1. Small to moderate left pleural effusion with left lower lobe atelectasis or infiltrate, similar to prior study. Electronically signed by: Franky Crease MD 03/16/2024 04:46 PM EST RP Workstation: [id number]   ECHOCARDIOGRAM COMPLETE Result Date: 03/16/2024    ECHOCARDIOGRAM REPORT   Patient Name:   [name] [name] Date of Exam: 03/16/2024 Medical Rec #:  [id number]     Height:       64.0 in Accession #:    [id number]    Weight:       181.7 lb Date of Birth:  1956/02/21     BSA:          1.878 m Patient Age:    68 years      BP:           104/62 mmHg Patient Gender: F             HR:           70 bpm. Exam Location:  Inpatient Procedure: 2D Echo, Intracardiac Opacification Agent, Cardiac Doppler and Color            Doppler (Both Spectral and Color Flow Doppler were utilized during            procedure). Indications:    Congestive Heart Failure I50.9  History:        Patient has prior history of Echocardiogram examinations, most  recent 10/16/2020. CHF and Cardiomyopathy, Pacemaker, PAD; Risk                 Factors:Hypertension, Dyslipidemia and Former Smoker.  Sonographer:    Koleen Popper RDCS Referring Phys: [id number] TAYLOR A PARCELLS  Sonographer Comments: Suboptimal apical window. IMPRESSIONS  1. Left ventricular ejection fraction, by estimation, is 20 to 25%. The left ventricle has severely decreased function. The left ventricle demonstrates global hypokinesis. The left ventricular internal cavity size was severely  dilated. There is severe eccentric left ventricular hypertrophy. Left ventricular diastolic parameters are consistent with Grade III diastolic dysfunction (restrictive). There is severe global hypokinesis with apical akinesis and swerlling of contrast concerning for early thrombus formation.  2. Right ventricular systolic function is mildly reduced. The right ventricular size is normal. There is normal pulmonary artery systolic pressure.  3. Left atrial size was mildly dilated.  4. Lead in RA.  5. The mitral valve is normal in structure. No evidence of mitral valve regurgitation.  6. At least moderate TR possibly in setting of lead impingement. Tricuspid valve regurgitation is moderate.  7. The aortic valve is normal in structure. Aortic valve regurgitation is not visualized.  8. The inferior vena cava is dilated in size with <50% respiratory variability, suggesting right atrial pressure of 15 mmHg. Comparison(s): Prior images reviewed side by side. EF is slightly worse and there is apical akinesis with possible early thrombus formation. No pericardial effusion. FINDINGS  Left Ventricle: Left ventricular ejection fraction, by estimation, is 20 to 25%. The left ventricle has severely decreased function. The left ventricle demonstrates global hypokinesis. Definity  contrast agent was given IV to delineate the left ventricular endocardial borders. The left ventricular internal cavity size was severely dilated. There is severe eccentric left ventricular hypertrophy. Left ventricular diastolic parameters are consistent with Grade III diastolic dysfunction (restrictive).  LV Wall Scoring: The apex is akinetic. The entire anterior wall, entire lateral wall, entire septum, and entire inferior wall are hypokinetic. There is severe global hypokinesis with apical akinesis and swerlling of contrast concerning for early thrombus formation. Right Ventricle: The right ventricular size is normal. No increase in right ventricular  wall thickness. Right ventricular systolic function is mildly reduced. There is normal pulmonary artery systolic pressure. The tricuspid regurgitant velocity is 2.14 m/s, and with an assumed right atrial pressure of 8 mmHg, the estimated right ventricular systolic pressure is 26.3 mmHg. Left Atrium: Left atrial size was mildly dilated. Right Atrium: Lead in RA. Right atrial size was normal in size. Pericardium: There is no evidence of pericardial effusion. Mitral Valve: The mitral valve is normal in structure. No evidence of mitral valve regurgitation. Tricuspid Valve: At least moderate TR possibly in setting of lead impingement. The tricuspid valve is normal in structure. Tricuspid valve regurgitation is moderate. Aortic Valve: The aortic valve is normal in structure. Aortic valve regurgitation is not visualized. Pulmonic Valve: The pulmonic valve was not well visualized. Pulmonic valve regurgitation is not visualized. Aorta: The aortic root is normal in size and structure. Venous: The inferior vena cava is dilated in size with less than 50% respiratory variability, suggesting right atrial pressure of 15 mmHg. Additional Comments: A device lead is visualized.  LEFT VENTRICLE PLAX 2D LVIDd:         5.60 cm      Diastology LVIDs:         5.00 cm      LV e' medial:    8.66 cm/s LV PW:  1.20 cm      LV E/e' medial:  10.5 LV IVS:        1.00 cm      LV e' lateral:   4.05 cm/s LVOT diam:     1.70 cm      LV E/e' lateral: 22.4 LV SV:         43 LV SV Index:   23 LVOT Area:     2.27 cm  LV Volumes (MOD) LV vol d, MOD A4C: 223.0 ml LV vol s, MOD A4C: 141.0 ml LV SV MOD A4C:     223.0 ml RIGHT VENTRICLE            IVC RV S prime:     7.01 cm/s  IVC diam: 2.30 cm TAPSE (M-mode): 1.3 cm LEFT ATRIUM           Index LA diam:      4.30 cm 2.29 cm/m LA Vol (A4C): 65.1 ml 34.66 ml/m  AORTIC VALVE LVOT Vmax:   132.00 cm/s LVOT Vmean:  77.600 cm/s LVOT VTI:    0.188 m  AORTA Ao Root diam: 3.10 cm Ao Asc diam:  3.40 cm  MITRAL VALVE               TRICUSPID VALVE MV Area (PHT): 4.49 cm    TR Peak grad:   18.3 mmHg MV Decel Time: 169 msec    TR Vmax:        214.00 cm/s MV E velocity: 90.70 cm/s MV A velocity: 22.00 cm/s  SHUNTS MV E/A ratio:  4.12        Systemic VTI:  0.19 m                            Systemic Diam: 1.70 cm Joelle Azobou Tonleu Electronically signed by Joelle Cedars Tonleu Signature Date/Time: 03/16/2024/12:33:08 PM    Final    DG Chest Port 1 View Result Date: 03/15/2024 EXAM: 1 VIEW(S) XRAY OF THE CHEST 03/15/2024 07:10:00 PM COMPARISON: 03/15/2024 CLINICAL HISTORY: S/P thoracentesis FINDINGS: LINES, TUBES AND DEVICES: Left chest Automatic Implantable Cardioverter Defibrillator (AICD) with leads stable in position. LUNGS AND PLEURA: Decreased small to moderate left pleural effusion. Associated left basilar retrocardiac opacification. No pneumothorax. HEART AND MEDIASTINUM: Cardiomegaly. Atherosclerotic calcifications of the aorta. BONES AND SOFT TISSUES: No acute osseous abnormality. IMPRESSION: 1. Decreased small to moderate left pleural effusion with associated left basilar retrocardiac opacification. 2. Cardiomegaly. Electronically signed by: Dorethia Molt MD 03/15/2024 07:41 PM EST RP Workstation: [id number]   DG Chest 2 View Result Date: 03/15/2024 CLINICAL DATA:  Shortness of breath 2 days. EXAM: CHEST - 2 VIEW COMPARISON:  03/01/2024 FINDINGS: Left-sided pacemaker unchanged. Lungs are adequately inflated demonstrate opacification over the left base/retrocardiac region with slight interval worsening compatible with moderate size effusion likely with associated basilar atelectasis. Right lung is clear. Stable cardiomegaly. Remainder of the exam is unchanged. IMPRESSION: 1. Slight interval worsening opacification over the left base/retrocardiac region compatible with moderate size effusion likely with associated basilar atelectasis. 2. Stable cardiomegaly. Electronically Signed   By: Toribio Agreste M.D.    On: 03/15/2024 09:06   EP STUDY Result Date: 03/07/2024 See surgical note for result.  DG Chest 2 View Result Date: 03/01/2024 CLINICAL DATA:  Post thoracotomy. EXAM: DG CHEST 2V COMPARISON:  02/15/2024 FINDINGS: Left-sided pacemaker unchanged. Lungs are somewhat hypoinflated demonstrate worsening moderate size left effusion likely with associated compressive atelectasis in the left base.  For small amount right pleural fluid. Stable cardiomegaly. Remainder of the exam is unchanged. IMPRESSION: 1. Worsening moderate size left effusion likely with associated compressive atelectasis in the left base. Small amount right pleural fluid. 2. Stable cardiomegaly. Electronically Signed   By: Toribio Agreste M.D.   On: 03/01/2024 15:08    Labs: BNP (last 3 results) Recent Labs    03/15/24 0836 03/16/24 1120  BNP 741.8* 502.6*   Basic Metabolic Panel: Recent Labs  Lab 03/16/24 0250 03/16/24 1120 03/18/24 0917 03/19/24 0155 03/20/24 0319 03/21/24 0322 03/22/24 0222  NA 124*   < > 122* 123* 131* 131* 131*  K 3.9   < > 3.8 3.5 3.8 4.0 4.0  CL 85*   < > 88* 85* 89* 89* 93*  CO2 26   < > 27 24 28  32 28  GLUCOSE 103*   < > 205* 289* 95 118* 125*  BUN 20   < > 16 21 23 23 23   CREATININE 1.10*   < > 0.77 0.90 0.92 0.99 1.00  CALCIUM  8.9   < > 9.0 9.2 9.2 9.2 8.9  MG 1.7   < > 2.0 2.0 1.8 1.9 2.1  PHOS 4.4  --   --   --   --   --   --    < > = values in this interval not displayed.   Liver Function Tests: Recent Labs  Lab 03/15/24 1928  PROT 6.7   No results for input(s): LIPASE, AMYLASE in the last 168 hours. No results for input(s): AMMONIA in the last 168 hours. CBC: Recent Labs  Lab 03/17/24 0454 03/18/24 0805 03/18/24 0809 03/18/24 0917 03/19/24 0155 03/20/24 0319 03/21/24 0322  WBC 10.6*  --   --  8.5 12.5* 9.0 9.7  NEUTROABS 7.7  --   --  7.5 10.4* 6.0 6.4  HGB 12.1   < > 11.9* 11.8* 11.9* 12.9 13.2  HCT 33.8*   < > 35.0* 33.1* 33.8* 37.7 38.3  MCV 86.9  --   --   86.0 87.8 88.7 88.2  PLT 253  --   --  250 275 317 315   < > = values in this interval not displayed.   CBG: Recent Labs  Lab 03/20/24 1700 03/20/24 2113 03/21/24 0610 03/21/24 1124 03/21/24 1631  GLUCAP 139* 104* 120* 139* 148*   Hgb A1c No results for input(s): HGBA1C in the last 72 hours. Anemia work up No results for input(s): VITAMINB12, FOLATE, FERRITIN, TIBC, IRON, RETICCTPCT in the last 72 hours. Cardiac Enzymes: No results for input(s): CKTOTAL, CKMB, CKMBINDEX, TROPONINI in the last 168 hours. BNP: Invalid input(s): POCBNP D-Dimer No results for input(s): DDIMER in the last 72 hours. Lipid Profile No results for input(s): CHOL, HDL, LDLCALC, TRIG, CHOLHDL, LDLDIRECT in the last 72 hours. Thyroid  function studies No results for input(s): TSH, T4TOTAL, T3FREE, THYROIDAB in the last 72 hours.  Invalid input(s): FREET3 Urinalysis    Component Value Date/Time   COLORURINE COLORLESS (A) 03/20/2024 1201   APPEARANCEUR CLEAR 03/20/2024 1201   APPEARANCEUR Clear 03/16/2019 0838   LABSPEC 1.005 03/20/2024 1201   PHURINE 8.0 03/20/2024 1201   GLUCOSEU NEGATIVE 03/20/2024 1201   HGBUR MODERATE (A) 03/20/2024 1201   BILIRUBINUR NEGATIVE 03/20/2024 1201   BILIRUBINUR Negative 03/16/2019 0838   KETONESUR NEGATIVE 03/20/2024 1201   PROTEINUR NEGATIVE 03/20/2024 1201   NITRITE NEGATIVE 03/20/2024 1201   LEUKOCYTESUR NEGATIVE 03/20/2024 1201   Sepsis Labs Recent Labs  Lab 03/18/24 [phone]  03/19/24 0155 03/20/24 0319 03/21/24 0322  WBC 8.5 12.5* 9.0 9.7   Microbiology Recent Results (from the past 240 hours)  Urine Culture     Status: Abnormal   Collection Time: 03/15/24 10:40 AM   Specimen: Urine, Clean Catch  Result Value Ref Range Status   Specimen Description URINE, CLEAN CATCH  Final   Special Requests   Final    NONE Performed at [facility], [address], [place], [place] [address]    Culture  MULTIPLE SPECIES PRESENT, SUGGEST RECOLLECTION (A)  Final   Report Status 03/16/2024 FINAL  Final  MRSA Next Gen by PCR, Nasal     Status: None   Collection Time: 03/15/24  2:26 PM   Specimen: Nasal Mucosa; Nasal Swab  Result Value Ref Range Status   MRSA by PCR Next Gen NOT DETECTED NOT DETECTED Final    Comment: (NOTE) The GeneXpert MRSA Assay (FDA approved for NASAL specimens only), is one component of a comprehensive MRSA colonization surveillance program. It is not intended to diagnose MRSA infection nor to guide or monitor treatment for MRSA infections. Test performance is not FDA approved in patients less than 30 years old. Performed at [facility], [address], [place], [place] [address]   Body fluid culture w Gram Stain     Status: None   Collection Time: 03/17/24  4:17 PM   Specimen: Pleura; Body Fluid  Result Value Ref Range Status   Specimen Description PLEURAL  Final   Special Requests NONE  Final   Gram Stain NO WBC SEEN NO ORGANISMS SEEN   Final   Culture   Final    NO GROWTH 3 DAYS Performed at [facility], [address], [place], [place] [address]    Report Status 03/20/2024 FINAL  Final     Time coordinating discharge: 35  minutes  SIGNED: Mennie LAMY, MD  [facility] 03/22/2024, 11:24 AM  If 7PM-7AM, please contact night-coverage [email or web address]

## 2024-03-24 LAB — MISC LABCORP TEST (SEND OUT): Labcorp test code: 9985

## 2024-03-28 ENCOUNTER — Other Ambulatory Visit: Payer: Self-pay | Admitting: Internal Medicine

## 2024-03-28 DIAGNOSIS — Z1231 Encounter for screening mammogram for malignant neoplasm of breast: Secondary | ICD-10-CM

## 2024-03-29 ENCOUNTER — Encounter (HOSPITAL_COMMUNITY): Payer: Self-pay

## 2024-03-29 ENCOUNTER — Other Ambulatory Visit (HOSPITAL_COMMUNITY): Payer: Self-pay

## 2024-03-29 ENCOUNTER — Telehealth (HOSPITAL_COMMUNITY): Payer: Self-pay

## 2024-03-29 ENCOUNTER — Inpatient Hospital Stay
Admission: RE | Admit: 2024-03-29 | Discharge: 2024-03-29 | Payer: PRIVATE HEALTH INSURANCE | Attending: Internal Medicine | Admitting: Internal Medicine

## 2024-03-29 ENCOUNTER — Telehealth: Payer: Self-pay

## 2024-03-29 ENCOUNTER — Ambulatory Visit (HOSPITAL_COMMUNITY): Admission: RE | Admit: 2024-03-29 | Discharge: 2024-03-29 | Payer: PRIVATE HEALTH INSURANCE | Attending: Cardiology

## 2024-03-29 ENCOUNTER — Ambulatory Visit (HOSPITAL_COMMUNITY): Payer: Self-pay | Admitting: Cardiology

## 2024-03-29 VITALS — BP 110/70 | HR 70 | Ht 64.0 in | Wt 177.8 lb

## 2024-03-29 DIAGNOSIS — I447 Left bundle-branch block, unspecified: Secondary | ICD-10-CM | POA: Insufficient documentation

## 2024-03-29 DIAGNOSIS — I459 Conduction disorder, unspecified: Secondary | ICD-10-CM | POA: Diagnosis not present

## 2024-03-29 DIAGNOSIS — I428 Other cardiomyopathies: Secondary | ICD-10-CM

## 2024-03-29 DIAGNOSIS — I4819 Other persistent atrial fibrillation: Secondary | ICD-10-CM | POA: Diagnosis not present

## 2024-03-29 DIAGNOSIS — Z87891 Personal history of nicotine dependence: Secondary | ICD-10-CM | POA: Insufficient documentation

## 2024-03-29 DIAGNOSIS — I739 Peripheral vascular disease, unspecified: Secondary | ICD-10-CM | POA: Insufficient documentation

## 2024-03-29 DIAGNOSIS — Z1231 Encounter for screening mammogram for malignant neoplasm of breast: Secondary | ICD-10-CM | POA: Diagnosis present

## 2024-03-29 DIAGNOSIS — Z79899 Other long term (current) drug therapy: Secondary | ICD-10-CM | POA: Diagnosis not present

## 2024-03-29 DIAGNOSIS — Z91040 Latex allergy status: Secondary | ICD-10-CM | POA: Insufficient documentation

## 2024-03-29 DIAGNOSIS — Z7901 Long term (current) use of anticoagulants: Secondary | ICD-10-CM | POA: Diagnosis not present

## 2024-03-29 DIAGNOSIS — I110 Hypertensive heart disease with heart failure: Secondary | ICD-10-CM | POA: Diagnosis not present

## 2024-03-29 DIAGNOSIS — I2510 Atherosclerotic heart disease of native coronary artery without angina pectoris: Secondary | ICD-10-CM | POA: Diagnosis not present

## 2024-03-29 DIAGNOSIS — I5022 Chronic systolic (congestive) heart failure: Secondary | ICD-10-CM | POA: Insufficient documentation

## 2024-03-29 DIAGNOSIS — E039 Hypothyroidism, unspecified: Secondary | ICD-10-CM | POA: Diagnosis not present

## 2024-03-29 LAB — BASIC METABOLIC PANEL WITH GFR
Anion gap: 12 (ref 5–15)
BUN: 22 mg/dL (ref 8–23)
CO2: 25 mmol/L (ref 22–32)
Calcium: 10 mg/dL (ref 8.9–10.3)
Chloride: 100 mmol/L (ref 98–111)
Creatinine, Ser: 1.18 mg/dL — ABNORMAL HIGH (ref 0.44–1.00)
GFR, Estimated: 51 mL/min — ABNORMAL LOW (ref >60)
Glucose, Bld: 102 mg/dL — ABNORMAL HIGH (ref 70–99)
Potassium: 3.9 mmol/L (ref 3.5–5.1)
Sodium: 138 mmol/L (ref 135–145)

## 2024-03-29 MED ORDER — SACUBITRIL-VALSARTAN 24-26 MG PO TABS: 1.0000 | ORAL_TABLET | Freq: Two times a day (BID) | ORAL | 3 refills | Status: DC

## 2024-03-29 NOTE — Telephone Encounter (Signed)
 Advanced Heart Failure Patient Advocate Encounter  Test billing for this patient's current coverage [employer]) returns a $20 copay for 90 day supply of Sacubitril -Valsartan .  This test claim was processed through [facility]- copay amounts may vary at other pharmacies due to pharmacy/plan contracts, or as the patient moves through the different stages of their insurance plan.  Rachel DEL, CPhT Rx Patient Advocate Phone: [phone]

## 2024-03-29 NOTE — Patient Instructions (Signed)
 Medication Changes:  RESTART ENTRESTO  12/13MG  TWICE DAILY (1/2) TABLET TWICE DAILY   Lab Work:  Labs done today, your results will be available in MyChart, we will contact you for abnormal readings.  Follow-Up in: AS SCHEDULED WITH DR. ZENAIDA   At the [facility] Failure Clinic, you and your health needs are our priority. We have a designated team specialized in the treatment of Heart Failure. This Care Team includes your primary Heart Failure Specialized Cardiologist (physician), Advanced Practice Providers (APPs- Physician Assistants and Nurse Practitioners), and Pharmacist who all work together to provide you with the care you need, when you need it.   You may see any of the following providers on your designated Care Team at your next follow up:  Dr. Toribio Fuel Dr. Ezra Shuck Dr. Odis Zenaida Greig Mosses, NP Caffie Shed, [place] [facility] [place], [place] Beckey Coe, NP Jordan Lee, NP Tinnie Redman, PharmD   Please be sure to bring in all your medications bottles to every appointment.   Need to Contact Us :  If you have any questions or concerns before your next appointment please send us  a message through [place] or call our office at [phone].    TO LEAVE A MESSAGE FOR THE NURSE SELECT OPTION 2, PLEASE LEAVE A MESSAGE INCLUDING: YOUR NAME DATE OF BIRTH CALL BACK NUMBER REASON FOR CALL**this is important as we prioritize the call backs  YOU WILL RECEIVE A CALL BACK THE SAME DAY AS LONG AS YOU CALL BEFORE 4:00 PM

## 2024-03-29 NOTE — Telephone Encounter (Signed)
 Alert received from [employer] for AT/AF burden >threshold. AF in progress from 12/4, controlled rates, s/p DCCV 03/07/24.  Routing to AF clinic as fyi.

## 2024-03-29 NOTE — Progress Notes (Signed)
 HEART & VASCULAR TRANSITION OF CARE CONSULT NOTE     Referring Physician: Dr. Kate   Chief Complaint: Systolic Heart Failure   HPI: Referred to clinic by [facility] for heart failure consultation.   68 y.o. female with a hx of CHB s/p PPM, nonischemic cardiomyopathy, chronic HFrEF, persistent A-Fib s/p recent DCCV 03/07/2024 w/ ERAF, PVD, aortic and coronary calcifications per CT, hypertension and hypothyroidism.  She developed CHB in her 30s, necessitating PPM. 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 Underwent left thoracotomy for LV epicardial lead placement 01/2024.  She had recent admission 12/25 for a/c CHF. Was noted to be back in Afib. Underwent thoracentesis with 1.3 L removed. Echo showed EF 20 to 25%, severe LVH, GIIIDD (restrictive), apical akinesis with possible early thrombus formation, RV nl. R/LHC demonstrated normal coronary arteries and elevated pressures (RA 16, RV 45/6, PA 43/26/33, PCWP cannot be obtained, LVEDP 23, CI 2.8). She was diuresed and started on amiodarone , with plans to f/u w/ EP regarding eventual AF ablation. Started on low dose GDMT. Previously failed Entresto  d/t hypotension and failed digoxin  d/t persistently elevated levels.   She presents to [facility] clinic today for evaluation for systolic heart failure.   She reports exertional fatigue, NYHA Class II-early III. No chest pain. Wt has been stable since recent hospital d/c. Device interrogation shows stable CorVue suggestive of euvolemia. 98% BiV pacing and 97% afib burden. She is rate controlled and reports compliance w/ Eliquis .    She has never had a cMRI.      Cardiac Testing   2D Echo 12/25 1. Left ventricular ejection fraction, by estimation, is 20 to 25%. The  left ventricle has severely decreased function. The left ventricle  demonstrates global hypokinesis. The left ventricular internal cavity  size  was severely dilated. There is severe  eccentric left ventricular hypertrophy. Left ventricular diastolic  parameters are consistent with Grade III diastolic dysfunction  (restrictive). There is severe global hypokinesis with apical akinesis and  swerlling of contrast concerning for early  thrombus formation.   2. Right ventricular systolic function is mildly reduced. The right  ventricular size is normal. There is normal pulmonary artery systolic  pressure.   3. Left atrial size was mildly dilated.   4. Lead in RA.   5. The mitral valve is normal in structure. No evidence of mitral valve  regurgitation.   6. At least moderate TR possibly in setting of lead impingement.  Tricuspid valve regurgitation is moderate.   7. The aortic valve is normal in structure. Aortic valve regurgitation is  not visualized.   8. The inferior vena cava is dilated in size with <50% respiratory  variability, suggesting right atrial pressure of 15 mmHg.   R/LHC 12/25 Coronary angiography 03/18/2024: LM: Normal LAD: Normal, no significant disease Lcx: Normal, no significant disease RCA: Dominant. Normal, no significant disease   LVEDP 23 mmHg   Right heart catheterization 03/18/2024: RA: 16 mmHg RV: 45/6 mmHg PA: 43/26 mmHg, mPAP 33 mmHg PCW: could not ob obtained PAPi 1.0   AO sats: 93% PA sats: 64%   CO: 5.2 L/min CI: 2.8 L/min/m2     Conclusion: Non coronary artery disease Elevated filling pressures, including RA Severe biventricular failure Decompensated nonischemic cardiomyopathy Moderate pulmonary hypertension. WHOP Grp II   Past Medical History:  Diagnosis Date   AICD (automatic cardioverter/defibrillator) present    St. Jude  Anxiety    takes Klonopin  daily as needed   Arthritis    Back pain    reason unknown   Chronic systolic CHF (congestive heart failure) (HCC)    a. Echo 10/2013: EF 40-45 (apical images 35-40%); b. Echo 12/2014: EF 35-40%; c. 06/2020 Echo: EF  25-30%, glob HK, Gr2 DD, RVSP ~ ; d. 10/2020 Echo: EF 25-30%, glob HK. Nl RV fxn, nl PASP, mildly dil LA. Small peric eff, triv MR.   Complete AV block (HCC)    a. s/p pacemaker insertion 1996; b. s/p contralateral side St. Jude generator change 11/2014.   Dizziness    occasionally   GERD (gastroesophageal reflux disease)    takes Zantac  daily as needed   Gout    Headache    occasionally   History of blood clots    in heart-was on Coumadin---this many yrs ago   History of colon polyps    benign   Hyperlipidemia    takes Lovastatin  daily   Hypertension    hx of-since hip replacement MD took resident off meds 10/30/14 b/c well controlled   Hypothyroid    takes Synthroid  daily   Joint pain    Joint swelling    Moderate Pericardial effusion    a. 07/2020 Echo: Mod effusion w/o tamponade; b. 10/2020 Echo: small peric eff w/o tamponade.   NICM (nonischemic cardiomyopathy) (HCC)    a. tachycardia induced->EF 40-45% (2010), 35-40% (10/2013), 35-40% Gr1 DD, mod TR (12/2014);  b. 01/2015 Cath: Nl cors, EF 30-35%, glob HK; c. 06/2020 Echo: EF 25-30%; d. 10/2020 Echo: EF 25-30%.   Numbness and tingling    left leg and right arm   Ovarian cancer (HCC)    ovarian, skin    Pacemaker lead failure--noise on atrial greater than ventricular lead with ventricular backup pulse pacing 10/17/2014   Peripheral artery disease    stent on lt illiac    Current Outpatient Medications  Medication Sig Dispense Refill   acetaminophen  (TYLENOL ) 325 MG tablet Take 2 tablets (650 mg total) by mouth every 6 (six) hours as needed for mild pain (pain score 1-3).     amiodarone  (PACERONE ) 200 MG tablet Continue taking 1 tablet (200 mg) by mouth twice a day until 12.12.25, then take 1 tablet (200mg ) by mouth once a day. 90 tablet 0   apixaban  (ELIQUIS ) 5 MG TABS tablet Take 1 tablet (5 mg total) by mouth 2 (two) times daily. 90 tablet 3   carvedilol  (COREG ) 6.25 MG tablet Take 1 tablet (6.25 mg total) by mouth 2 (two)  times daily. 180 tablet 3   Cholecalciferol (VITAMIN D3) 25 MCG (1000 UT) CAPS Take 1,000 Units by mouth at bedtime.     clonazePAM  (KLONOPIN ) 0.5 MG tablet TAKE 1 TABLET BY MOUTH ONCE DAILY AS NEEDED FOR ANXIETY/ SLEEP 30 tablet 0   cyanocobalamin (VITAMIN B12) 1000 MCG tablet Take 1,000 mcg by mouth at bedtime.     furosemide  (LASIX ) 40 MG tablet Take 1 tablet (40 mg total) by mouth daily. 30 tablet 0   levothyroxine  (SYNTHROID ) 112 MCG tablet Take 1 tablet by mouth once daily 90 tablet 1   lovastatin  (MEVACOR ) 20 MG tablet TAKE 1 TABLET BY MOUTH AT BEDTIME 90 tablet 0   oxyCODONE  (OXY IR/ROXICODONE ) 5 MG immediate release tablet Take 1 tablet (5 mg total) by mouth every 6 (six) hours as needed for severe pain (pain score 7-10). 28 tablet 0   potassium chloride  (KLOR-CON  M) 10 MEQ tablet Take 1  tablet (10 mEq total) by mouth daily. (Patient taking differently: Take 10 mEq by mouth as needed (take with Lasix ).) 7 tablet 0   sacubitril -valsartan  (ENTRESTO ) 24-26 MG Take 1 tablet by mouth 2 (two) times daily. Take (1/2) tablet twice daily 60 tablet 3   spironolactone  (ALDACTONE ) 25 MG tablet Take 12.5 mg by mouth daily.     No current facility-administered medications for this encounter.    Allergies[1]    Social History   Socioeconomic History   Marital status: Married    Spouse name: Not on file   Number of children: Not on file   Years of education: Not on file   Highest education level: Not on file  Occupational History   Not on file  Tobacco Use   Smoking status: Former    Current packs/day: 0.00    Types: Cigarettes    Quit date: 07/17/2008    Years since quitting: 15.7   Smokeless tobacco: Never   Tobacco comments:    Former smoker 02/10/24  Vaping Use   Vaping status: Never Used  Substance and Sexual Activity   Alcohol use: Not Currently    Comment: rum daily-previously drank beer daily   Drug use: No   Sexual activity: Not Currently    Birth control/protection:  Surgical  Other Topics Concern   Not on file  Social History Narrative   Not on file   Social Drivers of Health   Tobacco Use: Medium Risk (03/29/2024)   Patient History    Smoking Tobacco Use: Former    Smokeless Tobacco Use: Never    Passive Exposure: Not on file  Financial Resource Strain: Low Risk  (08/19/2023)   Received from [facility]   Overall Financial Resource Strain (CARDIA)    Difficulty of Paying Living Expenses: Not hard at all  Food Insecurity: No Food Insecurity (03/16/2024)   Epic    Worried About Radiation Protection Practitioner of Food in the Last Year: Never true    Ran Out of Food in the Last Year: Never true  Transportation Needs: No Transportation Needs (03/16/2024)   Epic    Lack of Transportation (Medical): No    Lack of Transportation (Non-Medical): No  Physical Activity: Not on file  Stress: Not on file  Social Connections: Socially Integrated (03/16/2024)   Social Connection and Isolation Panel    Frequency of Communication with Friends and Family: More than three times a week    Frequency of Social Gatherings with Friends and Family: Once a week    Attends Religious Services: More than 4 times per year    Active Member of [employer] or Organizations: Yes    Attends Banker Meetings: More than 4 times per year    Marital Status: Married  Catering Manager Violence: Not At Risk (03/16/2024)   Epic    Fear of Current or Ex-Partner: No    Emotionally Abused: No    Physically Abused: No    Sexually Abused: No  Depression (PHQ2-9): Not on file  Alcohol Screen: Not on file  Housing: Low Risk (03/16/2024)   Epic    Unable to Pay for Housing in the Last Year: No    Number of Times Moved in the Last Year: 0    Homeless in the Last Year: No  Utilities: Not At Risk (03/16/2024)   Epic    Threatened with loss of utilities: No  Health Literacy: Not on file      Family History  Problem Relation  Age of Onset   Hyperlipidemia Mother    Polymyalgia  rheumatica Mother    Cancer Father        died of lung cancer   Lung cancer Father    Breast cancer Sister    Breast cancer Paternal Aunt    Diabetes Brother    Arrhythmia Son     Vitals:   03/29/24 1330  BP: 110/70  Pulse: 70  SpO2: 97%  Weight: 80.6 kg (177 lb 12.8 oz)  Height: 5' 4 (1.626 m)    PHYSICAL EXAM: General:  fatigued appearing. No respiratory difficulty HEENT: normal Neck: supple. no JVD. Carotids 2+ bilat; no bruits. No lymphadenopathy or thryomegaly appreciated. Cor: PMI nondisplaced. Regular rate & rhythm. No rubs, gallops or murmurs. Lungs: clear Abdomen: soft, nontender, nondistended. No hepatosplenomegaly. No bruits or masses. Good bowel sounds. Extremities: no cyanosis, clubbing, rash, edema Neuro: alert & oriented x 3, cranial nerves grossly intact. moves all 4 extremities w/o difficulty. Affect pleasant.  ECG: not performed    ASSESSMENT & PLAN:  1. Chronic Systolic Heart Failure  - EF chronically low for several years w/ progressive decline, most recent echo 12/25 EF 20-25%, severe LVH GIIIDD, RV nl  - NICM, LHC w/ 12/25 w/ no significant CAD - given severe LVH w/ conduction disorder including CHB necessitating PPM at a young age + Afib, and inability to tolerate digoxin  d/t elevated levels w/ use, I am concerned that she may have an infiltrative CM. Unfortunately, her device is not MRI compatible, thus cannot undergo evaluation w/ cMRI.  - I will obtain amyloid labs including multiple myeloma, SPEP/UPEP and will refer to the [facility] (Dr. Zenaida) to be considered for EMBx. May also consider PYP +/- PET.  - NYHA Class II, limited more so by exertional fatigue than dyspnea. Recent RHC w/ normal output, CI 2.8 - Euvolemic on exam and by device interrogation  - will retrial low dose Entresto  (12-13 mg bid) - continue spironolactone  12.5 mg daily  - continue Coreg  6.25 mg bid - continue Lasix  40 mg daily  - she did not tolerate SGLT2i d/t frequent UTIs -  failed digoxin  d/t elevated levels  - check BMP today  - will follow in the [facility]. May need advanced therapies in the future  - with new placement of LV lead and BiV pacing, will need updated echo in ~2 months to see if a responder  - would also benefit from restoration of NSR. EP following for potential future AF ablation   2. ? Possible LV Thrombus - noted on 2D Echo  - she is on Eliquis  for afib, will continue     Referred to HFSW (PCP, Medications, Transportation, ETOH Abuse, Drug Abuse, Insurance, Financial ): No  Refer to Pharmacy: No Refer to Home Health: No  Refer to Advanced Heart Failure Clinic: Yes (Dr. Zenaida)  Refer to General Cardiology: Yes (shared care)   Follow up w/ Dr. Zenaida in 4 wks   Yaslene Lindamood Marcine, PA-C      [1]  Allergies Allergen Reactions   Marshmallow [Althaea Officinalis]     Rash, hives, itching, SOB  Pt states this is no longer an issue for her   Other     Jello - Rash, hives, itching, SOB  Pt states is no longer an issue for her   Latex Rash   Nickel Rash

## 2024-03-30 ENCOUNTER — Encounter: Payer: PRIVATE HEALTH INSURANCE | Admitting: Cardiology

## 2024-03-30 ENCOUNTER — Other Ambulatory Visit (HOSPITAL_COMMUNITY): Payer: Self-pay

## 2024-03-30 LAB — KAPPA/LAMBDA LIGHT CHAINS
Kappa free light chain: 29.8 mg/L — ABNORMAL HIGH (ref 3.3–19.4)
Kappa, lambda light chain ratio: 1.24 (ref 0.26–1.65)
Lambda free light chains: 24 mg/L (ref 5.7–26.3)

## 2024-03-30 MED ORDER — SACUBITRIL-VALSARTAN 24-26 MG PO TABS: ORAL_TABLET | ORAL | 3 refills | Status: DC

## 2024-03-31 LAB — MULTIPLE MYELOMA PANEL, SERUM
Albumin SerPl Elph-Mcnc: 3.4 g/dL (ref 2.9–4.4)
Albumin/Glob SerPl: 1.1 (ref 0.7–1.7)
Alpha 1: 0.3 g/dL (ref 0.0–0.4)
Alpha2 Glob SerPl Elph-Mcnc: 0.8 g/dL (ref 0.4–1.0)
B-Globulin SerPl Elph-Mcnc: 1 g/dL (ref 0.7–1.3)
Gamma Glob SerPl Elph-Mcnc: 1.2 g/dL (ref 0.4–1.8)
Globulin, Total: 3.3 g/dL (ref 2.2–3.9)
IgA: 274 mg/dL (ref 87–352)
IgG (Immunoglobin G), Serum: 1057 mg/dL (ref 586–1602)
IgM (Immunoglobulin M), Srm: 94 mg/dL (ref 26–217)
Total Protein ELP: 6.7 g/dL (ref 6.0–8.5)

## 2024-04-03 ENCOUNTER — Telehealth: Payer: Self-pay

## 2024-04-03 ENCOUNTER — Encounter (HOSPITAL_COMMUNITY): Payer: Self-pay | Admitting: Emergency Medicine

## 2024-04-03 ENCOUNTER — Emergency Department (HOSPITAL_COMMUNITY): Payer: PRIVATE HEALTH INSURANCE

## 2024-04-03 ENCOUNTER — Other Ambulatory Visit: Payer: Self-pay

## 2024-04-03 ENCOUNTER — Inpatient Hospital Stay (HOSPITAL_COMMUNITY)
Admission: EM | Admit: 2024-04-03 | Discharge: 2024-04-05 | DRG: 291 | Disposition: A | Discharge status: Home / Self Care | Payer: PRIVATE HEALTH INSURANCE

## 2024-04-03 DIAGNOSIS — Z860100 Personal history of colon polyps, unspecified: Secondary | ICD-10-CM

## 2024-04-03 DIAGNOSIS — N179 Acute kidney failure, unspecified: Secondary | ICD-10-CM | POA: Diagnosis present

## 2024-04-03 DIAGNOSIS — Z79890 Hormone replacement therapy (postmenopausal): Secondary | ICD-10-CM | POA: Diagnosis not present

## 2024-04-03 DIAGNOSIS — Z90722 Acquired absence of ovaries, bilateral: Secondary | ICD-10-CM

## 2024-04-03 DIAGNOSIS — I4819 Other persistent atrial fibrillation: Secondary | ICD-10-CM | POA: Diagnosis present

## 2024-04-03 DIAGNOSIS — Z7901 Long term (current) use of anticoagulants: Secondary | ICD-10-CM

## 2024-04-03 DIAGNOSIS — I959 Hypotension, unspecified: Secondary | ICD-10-CM | POA: Diagnosis present

## 2024-04-03 DIAGNOSIS — E039 Hypothyroidism, unspecified: Secondary | ICD-10-CM | POA: Diagnosis present

## 2024-04-03 DIAGNOSIS — F419 Anxiety disorder, unspecified: Secondary | ICD-10-CM | POA: Diagnosis present

## 2024-04-03 DIAGNOSIS — I4892 Unspecified atrial flutter: Secondary | ICD-10-CM | POA: Diagnosis present

## 2024-04-03 DIAGNOSIS — Z87891 Personal history of nicotine dependence: Secondary | ICD-10-CM

## 2024-04-03 DIAGNOSIS — Z803 Family history of malignant neoplasm of breast: Secondary | ICD-10-CM

## 2024-04-03 DIAGNOSIS — R5381 Other malaise: Secondary | ICD-10-CM | POA: Diagnosis present

## 2024-04-03 DIAGNOSIS — Z91040 Latex allergy status: Secondary | ICD-10-CM | POA: Diagnosis not present

## 2024-04-03 DIAGNOSIS — Z95810 Presence of automatic (implantable) cardiac defibrillator: Secondary | ICD-10-CM

## 2024-04-03 DIAGNOSIS — I509 Heart failure, unspecified: Secondary | ICD-10-CM | POA: Diagnosis not present

## 2024-04-03 DIAGNOSIS — Z90710 Acquired absence of both cervix and uterus: Secondary | ICD-10-CM

## 2024-04-03 DIAGNOSIS — Z833 Family history of diabetes mellitus: Secondary | ICD-10-CM | POA: Diagnosis not present

## 2024-04-03 DIAGNOSIS — I110 Hypertensive heart disease with heart failure: Secondary | ICD-10-CM | POA: Diagnosis present

## 2024-04-03 DIAGNOSIS — R7989 Other specified abnormal findings of blood chemistry: Secondary | ICD-10-CM | POA: Diagnosis present

## 2024-04-03 DIAGNOSIS — R519 Headache, unspecified: Secondary | ICD-10-CM | POA: Diagnosis present

## 2024-04-03 DIAGNOSIS — E871 Hypo-osmolality and hyponatremia: Secondary | ICD-10-CM | POA: Diagnosis present

## 2024-04-03 DIAGNOSIS — Z91048 Other nonmedicinal substance allergy status: Secondary | ICD-10-CM

## 2024-04-03 DIAGNOSIS — Z91018 Allergy to other foods: Secondary | ICD-10-CM

## 2024-04-03 DIAGNOSIS — I739 Peripheral vascular disease, unspecified: Secondary | ICD-10-CM | POA: Diagnosis present

## 2024-04-03 DIAGNOSIS — Z79899 Other long term (current) drug therapy: Secondary | ICD-10-CM | POA: Diagnosis not present

## 2024-04-03 DIAGNOSIS — I5043 Acute on chronic combined systolic (congestive) and diastolic (congestive) heart failure: Secondary | ICD-10-CM | POA: Diagnosis present

## 2024-04-03 DIAGNOSIS — D72829 Elevated white blood cell count, unspecified: Secondary | ICD-10-CM | POA: Diagnosis present

## 2024-04-03 DIAGNOSIS — Z9079 Acquired absence of other genital organ(s): Secondary | ICD-10-CM

## 2024-04-03 DIAGNOSIS — I428 Other cardiomyopathies: Secondary | ICD-10-CM | POA: Diagnosis present

## 2024-04-03 DIAGNOSIS — Z8543 Personal history of malignant neoplasm of ovary: Secondary | ICD-10-CM | POA: Diagnosis not present

## 2024-04-03 DIAGNOSIS — Z1152 Encounter for screening for COVID-19: Secondary | ICD-10-CM

## 2024-04-03 DIAGNOSIS — Z83438 Family history of other disorder of lipoprotein metabolism and other lipidemia: Secondary | ICD-10-CM

## 2024-04-03 DIAGNOSIS — Z801 Family history of malignant neoplasm of trachea, bronchus and lung: Secondary | ICD-10-CM

## 2024-04-03 DIAGNOSIS — M791 Myalgia, unspecified site: Secondary | ICD-10-CM | POA: Diagnosis not present

## 2024-04-03 DIAGNOSIS — Z86718 Personal history of other venous thrombosis and embolism: Secondary | ICD-10-CM

## 2024-04-03 DIAGNOSIS — E785 Hyperlipidemia, unspecified: Secondary | ICD-10-CM | POA: Diagnosis present

## 2024-04-03 DIAGNOSIS — I9589 Other hypotension: Secondary | ICD-10-CM | POA: Diagnosis not present

## 2024-04-03 DIAGNOSIS — K219 Gastro-esophageal reflux disease without esophagitis: Secondary | ICD-10-CM | POA: Diagnosis present

## 2024-04-03 DIAGNOSIS — K5900 Constipation, unspecified: Secondary | ICD-10-CM | POA: Diagnosis present

## 2024-04-03 DIAGNOSIS — Z96641 Presence of right artificial hip joint: Secondary | ICD-10-CM | POA: Diagnosis present

## 2024-04-03 LAB — TROPONIN T, HIGH SENSITIVITY
Troponin T High Sensitivity: 72 ng/L — ABNORMAL HIGH (ref 0–19)
Troponin T High Sensitivity: 67 ng/L — ABNORMAL HIGH (ref 0–19)

## 2024-04-03 LAB — URINALYSIS, ROUTINE W REFLEX MICROSCOPIC
Glucose, UA: NEGATIVE mg/dL
Hgb urine dipstick: NEGATIVE
Ketones, ur: NEGATIVE mg/dL
Leukocytes,Ua: NEGATIVE
Nitrite: NEGATIVE
Protein, ur: NEGATIVE mg/dL
Specific Gravity, Urine: 1.02 (ref 1.005–1.030)
pH: 5.5 (ref 5.0–8.0)

## 2024-04-03 LAB — BASIC METABOLIC PANEL WITH GFR
Anion gap: 11 (ref 5–15)
BUN: 41 mg/dL — ABNORMAL HIGH (ref 8–23)
CO2: 24 mmol/L (ref 22–32)
Calcium: 9.3 mg/dL (ref 8.9–10.3)
Chloride: 91 mmol/L — ABNORMAL LOW (ref 98–111)
Creatinine, Ser: 1.55 mg/dL — ABNORMAL HIGH (ref 0.44–1.00)
GFR, Estimated: 37 mL/min — ABNORMAL LOW
Glucose, Bld: 162 mg/dL — ABNORMAL HIGH (ref 70–99)
Potassium: 3.8 mmol/L (ref 3.5–5.1)
Sodium: 126 mmol/L — ABNORMAL LOW (ref 135–145)

## 2024-04-03 LAB — HEPATIC FUNCTION PANEL
ALT: 14 U/L (ref 0–44)
AST: 28 U/L (ref 15–41)
Albumin: 3.1 g/dL — ABNORMAL LOW (ref 3.5–5.0)
Alkaline Phosphatase: 85 U/L (ref 38–126)
Bilirubin, Direct: 0.3 mg/dL — ABNORMAL HIGH (ref 0.0–0.2)
Indirect Bilirubin: 0.3 mg/dL (ref 0.3–0.9)
Total Bilirubin: 0.6 mg/dL (ref 0.0–1.2)
Total Protein: 6.1 g/dL — ABNORMAL LOW (ref 6.5–8.1)

## 2024-04-03 LAB — RESP PANEL BY RT-PCR (RSV, FLU A&B, COVID)  RVPGX2
Influenza A by PCR: NEGATIVE
Influenza B by PCR: NEGATIVE
Resp Syncytial Virus by PCR: NEGATIVE
SARS Coronavirus 2 by RT PCR: NEGATIVE

## 2024-04-03 LAB — RESPIRATORY PANEL BY PCR

## 2024-04-03 LAB — CBC
HCT: 34.3 % — ABNORMAL LOW (ref 36.0–46.0)
Hemoglobin: 11.5 g/dL — ABNORMAL LOW (ref 12.0–15.0)
MCH: 29.9 pg (ref 26.0–34.0)
MCHC: 33.5 g/dL (ref 30.0–36.0)
MCV: 89.3 fL (ref 80.0–100.0)
Platelets: 243 K/uL (ref 150–400)
RBC: 3.84 MIL/uL — ABNORMAL LOW (ref 3.87–5.11)
RDW: 14.4 % (ref 11.5–15.5)
WBC: 11.5 K/uL — ABNORMAL HIGH (ref 4.0–10.5)
nRBC: 0 % (ref 0.0–0.2)

## 2024-04-03 LAB — PROTIME-INR
INR: 2.1 — ABNORMAL HIGH (ref 0.8–1.2)
Prothrombin Time: 24.2 s — ABNORMAL HIGH (ref 11.4–15.2)

## 2024-04-03 LAB — SODIUM: Sodium: 126 mmol/L — ABNORMAL LOW (ref 135–145)

## 2024-04-03 LAB — PRO BRAIN NATRIURETIC PEPTIDE: Pro Brain Natriuretic Peptide: 1962 pg/mL — ABNORMAL HIGH

## 2024-04-03 LAB — PROCALCITONIN: Procalcitonin: 0.69 ng/mL

## 2024-04-03 MED ORDER — PRAVASTATIN SODIUM 40 MG PO TABS
20.0000 mg | ORAL_TABLET | Freq: Every day | ORAL | Status: DC
  Administered 2024-04-04: 20 mg via ORAL
  Filled 2024-04-03: qty 1

## 2024-04-03 MED ORDER — CLONAZEPAM 0.5 MG PO TABS
0.5000 mg | ORAL_TABLET | Freq: Two times a day (BID) | ORAL | Status: DC
  Administered 2024-04-03 – 2024-04-05 (×3): 0.5 mg via ORAL
  Filled 2024-04-03 (×4): qty 1

## 2024-04-03 MED ORDER — LACTATED RINGERS IV BOLUS
250.0000 mL | Freq: Once | INTRAVENOUS | Status: AC
  Administered 2024-04-03: 250 mL via INTRAVENOUS

## 2024-04-03 MED ORDER — OXYCODONE HCL 5 MG PO TABS: 5.0000 mg | ORAL_TABLET | Freq: Four times a day (QID) | ORAL | Status: DC | PRN

## 2024-04-03 MED ORDER — FUROSEMIDE 10 MG/ML IJ SOLN: 40.0000 mg | Freq: Two times a day (BID) | INTRAMUSCULAR | Status: DC

## 2024-04-03 MED ORDER — ACETAMINOPHEN 650 MG RE SUPP: 650.0000 mg | Freq: Four times a day (QID) | RECTAL | Status: DC | PRN

## 2024-04-03 MED ORDER — APIXABAN 5 MG PO TABS
5.0000 mg | ORAL_TABLET | Freq: Two times a day (BID) | ORAL | Status: DC
  Administered 2024-04-03 – 2024-04-05 (×4): 5 mg via ORAL
  Filled 2024-04-03 (×4): qty 1

## 2024-04-03 MED ORDER — ACETAMINOPHEN 325 MG PO TABS: 650.0000 mg | ORAL_TABLET | Freq: Four times a day (QID) | ORAL | Status: DC | PRN

## 2024-04-03 MED ORDER — ALBUTEROL SULFATE (2.5 MG/3ML) 0.083% IN NEBU: 2.5000 mg | INHALATION_SOLUTION | Freq: Four times a day (QID) | RESPIRATORY_TRACT | Status: DC | PRN

## 2024-04-03 MED ORDER — LEVOTHYROXINE SODIUM 112 MCG PO TABS
112.0000 ug | ORAL_TABLET | Freq: Every day | ORAL | Status: DC
  Administered 2024-04-04 – 2024-04-05 (×2): 112 ug via ORAL
  Filled 2024-04-03 (×2): qty 1

## 2024-04-03 MED ORDER — CARVEDILOL 3.125 MG PO TABS: 6.2500 mg | ORAL_TABLET | Freq: Two times a day (BID) | ORAL | Status: DC

## 2024-04-03 MED ORDER — LACTATED RINGERS IV BOLUS
500.0000 mL | Freq: Once | INTRAVENOUS | Status: AC
  Administered 2024-04-03: 500 mL via INTRAVENOUS

## 2024-04-03 NOTE — Telephone Encounter (Signed)
"  ° °  Received a call that patient is having side effects to a new medication. Is seeking advice.  On chart review started taking entresto  after AHF visit on 12/16. She also started taking amiodarone  12/6 for atrial fibrillation.   Patient reported several days of headache, chest pain, light sensitivity, fatigue, inability to sleep, and decreased appetite. She was looking up her symptoms and was concerned it was due to amiodarone  toxicity. She had not taken the amiodarone  dose today.  Given the constellation of symptoms including chest pain, I advised patient to present to the ED for further evaluation as it is unclear what is the etiology.   Caller verbalized understanding and was grateful for the call back.  Leontine LOISE Salen, PA-C 04/03/2024, 10:13 AM   "

## 2024-04-03 NOTE — ED Triage Notes (Signed)
 Pt via POV c/o CP x 5 days with nausea, dizziness, SOB, decreased appetite. PMH includes CHF. Pt was taking amiodarone  and was advised to hold her dose today due to concern for side effects.

## 2024-04-03 NOTE — ED Notes (Signed)
 Pt attempted to urinate. Pt was gave a small specimen. Explained to patient we will need more urine to send sample to the lab. Pt receiving IVF. Pt to try again once fluids have completed.

## 2024-04-03 NOTE — ED Notes (Addendum)
" °   04/03/24 1744 04/03/24 1747  Vitals  BP (!) 89/54 (!) 85/48  MAP (mmHg) (!) 63 (!) 61    Dr. Claudene notified of patients blood pressure at this time.  "

## 2024-04-03 NOTE — ED Notes (Signed)
 Pt going to XRAY, will assess patient when she returns.

## 2024-04-03 NOTE — ED Notes (Signed)
 A regular tray ordered

## 2024-04-03 NOTE — H&P (Addendum)
 " History and Physical    Patient: [name] [name] FMW:[id number] DOB: 07/06/1955 DOA: 04/03/2024 DOS: the patient was seen and examined on 04/03/2024 PCP: Sherial Bail, MD  Patient coming from: Home  Chief Complaint:  Chief Complaint  Patient presents with   Chest Pain   HPI: [name] [name] is a 68 y.o. female with medical history significant of Hypertension, hyperlipidemia, systolic CHF, atrial fibrillation, complete heart block s/p PPM, hypothyroidism, peripheral artery disease s/p iliac stent, and GERD who presents with malaise and concerns of side effects from new medications.  She has been feeling generally unwell over the past few days, attributing her symptoms to new medications. She describes experiencing headaches, aches across her shoulders and back, loss of appetite, loss of energy, and nausea. The headaches are associated with photophobia.  She started taking amiodarone  on December 5th, following a hospital admission on December 2nd. She believes the symptoms began after starting this medication. Additionally, she was restarted on Entresto  on December 16th, taking half a pill twice a day, after having been taken off it post-surgery.  She reports difficulty urinating despite taking Lasix  40 mg in the morning. She is unable to produce more than a small urine sample and is concerned about not being able to urinate. Her weight has increased slightly over the last two days, but not beyond the threshold of concern (more than 2-3 pounds per day or 5 pounds in a week).  Her glucose levels have been fluctuating, with a recent high of nearly 300 after taking prednisone , and a current level of 160. Her A1c was 5.2 during her last hospital stay.  She is experiencing hunger and dissatisfaction with the hospital diet, noting she has not eaten much today. No cough.  In the emergency department patient was noted to be afebrile with respirations 14-28, blood pressure 67/57 to 90/61, and O2  saturations maintained on room air.  Labs significant for WBC 11.5, hemoglobin 11.5, sodium 126, chloride 91, BUN 41, creatinine 1.55, glucose 162, and INR 2.1.  Chest x-ray noted stable cardiomegaly with stable left-sided pleural effusion and basilar opacity, and new small right pleural effusion with vascular congestion and.  CT scan of the head noted no acute intercranial abnormality.  Influenza, RSV, and COVID-19 screening were negative.  Patient had been given a bolus of 750 mL of normal saline IV fluids while in the ED.  Review of Systems: As mentioned in the history of present illness. All other systems reviewed and are negative. Past Medical History:  Diagnosis Date   AICD (automatic cardioverter/defibrillator) present    St. Jude   Anxiety    takes Klonopin  daily as needed   Arthritis    Back pain    reason unknown   Chronic systolic CHF (congestive heart failure) (HCC)    a. Echo 10/2013: EF 40-45 (apical images 35-40%); b. Echo 12/2014: EF 35-40%; c. 06/2020 Echo: EF 25-30%, glob HK, Gr2 DD, RVSP ~ ; d. 10/2020 Echo: EF 25-30%, glob HK. Nl RV fxn, nl PASP, mildly dil LA. Small peric eff, triv MR.   Complete AV block (HCC)    a. s/p pacemaker insertion 1996; b. s/p contralateral side St. Jude generator change 11/2014.   Dizziness    occasionally   GERD (gastroesophageal reflux disease)    takes Zantac  daily as needed   Gout    Headache    occasionally   History of blood clots    in heart-was on Coumadin---this many yrs ago   History  of colon polyps    benign   Hyperlipidemia    takes Lovastatin  daily   Hypertension    hx of-since hip replacement MD took resident off meds 10/30/14 b/c well controlled   Hypothyroid    takes Synthroid  daily   Joint pain    Joint swelling    Moderate Pericardial effusion    a. 07/2020 Echo: Mod effusion w/o tamponade; b. 10/2020 Echo: small peric eff w/o tamponade.   NICM (nonischemic cardiomyopathy) (HCC)    a. tachycardia induced->EF 40-45%  (2010), 35-40% (10/2013), 35-40% Gr1 DD, mod TR (12/2014);  b. 01/2015 Cath: Nl cors, EF 30-35%, glob HK; c. 06/2020 Echo: EF 25-30%; d. 10/2020 Echo: EF 25-30%.   Numbness and tingling    left leg and right arm   Ovarian cancer (HCC)    ovarian, skin    Pacemaker lead failure--noise on atrial greater than ventricular lead with ventricular backup pulse pacing 10/17/2014   Peripheral artery disease    stent on lt illiac   Past Surgical History:  Procedure Laterality Date   ABDOMINAL HYSTERECTOMY     ANGIOPLASTY / STENTING ILIAC     BIV UPGRADE N/A 12/29/2023   Procedure: BIV UPGRADE;  Surgeon: Cindie Ole DASEN, MD;  Location: [facility] INVASIVE CV LAB;  Service: Cardiovascular;  Laterality: N/A;   CARDIAC CATHETERIZATION N/A 02/08/2015   Procedure: Left Heart Cath and Coronary Angiography;  Surgeon: Deatrice DELENA Cage, MD;  Location: [facility] INVASIVE CV LAB;  Service: Cardiovascular;  Laterality: N/A;   CARDIOVERSION N/A 03/07/2024   Procedure: CARDIOVERSION;  Surgeon: Raford Riggs, MD;  Location: [facility] INVASIVE CV LAB;  Service: Cardiovascular;  Laterality: N/A;   CERVICAL CERCLAGE  1994   COLONOSCOPY     DG BARIUM SWALLOW ([facility] HX)     DILATION AND CURETTAGE OF UTERUS     EP IMPLANTABLE DEVICE Left 12/06/2014   Procedure: Implantation of cardiac resynchronization therapy pacemaker;  Surgeon: Danelle LELON Birmingham, MD;  Location: [facility] OR;  Service: Cardiovascular;  Laterality: Left;   EPICARDIAL PACING LEAD PLACEMENT N/A 02/05/2024   Procedure: INSERTION, EPICARDIAL ELECTRODE LEAD;  Surgeon: Lucas Dorise POUR, MD;  Location: [facility] OR;  Service: Thoracic;  Laterality: N/A;   HERNIA MESH REMOVAL Right    incisional   HERNIA REPAIR     ILIAC ARTERY STENT  04/05/2010   Left artery stenting   INSERT / REPLACE / REMOVE PACEMAKER     JOINT REPLACEMENT  10/31/2014   hip   KNEE SURGERY Right    LEAD INSERTION N/A 12/29/2023   Procedure: LEAD INSERTION;  Surgeon: Cindie Ole DASEN, MD;  Location: [facility] INVASIVE CV  LAB;  Service: Cardiovascular;  Laterality: N/A;   PACEMAKER INSERTION  2012   Had replaced in July 2012, first placed in 1996   PACEMAKER LEAD REMOVAL Right 12/06/2014   Procedure: REMOVAL OF RV AND RA PACEMAKER LEADS;  Surgeon: Danelle LELON Birmingham, MD;  Location: [facility] OR;  Service: Cardiovascular;  Laterality: Right;  DR. BARTLE TO BACK UP    PACEMAKER PLACEMENT Left 12/06/2014   RIGHT/LEFT HEART CATH AND CORONARY ANGIOGRAPHY N/A 03/18/2024   Procedure: RIGHT/LEFT HEART CATH AND CORONARY ANGIOGRAPHY;  Surgeon: Elmira Newman PARAS, MD;  Location: [facility] INVASIVE CV LAB;  Service: Cardiovascular;  Laterality: N/A;   THORACOTOMY Left 02/05/2024   Procedure: THORACOTOMY, MAJOR;  Surgeon: Lucas Dorise POUR, MD;  Location: [facility] OR;  Service: Thoracic;  Laterality: Left;   TOTAL ABDOMINAL HYSTERECTOMY W/ BILATERAL SALPINGOOPHORECTOMY  07/2008   Ovarian cancer  TOTAL HIP ARTHROPLASTY Right 10/31/2014   Procedure: TOTAL HIP ARTHROPLASTY ANTERIOR APPROACH;  Surgeon: Ozell Flake, MD;  Location: [facility] ORS;  Service: Orthopedics;  Laterality: Right;   Social History:  reports that she quit smoking about 15 years ago. Her smoking use included cigarettes. She has never used smokeless tobacco. She reports that she does not currently use alcohol. She reports that she does not use drugs.  Allergies[1]  Family History  Problem Relation Age of Onset   Hyperlipidemia Mother    Polymyalgia rheumatica Mother    Cancer Father        died of lung cancer   Lung cancer Father    Breast cancer Sister    Breast cancer Paternal Aunt    Diabetes Brother    Arrhythmia Son     Prior to Admission medications  Medication Sig Start Date End Date Taking? Authorizing Provider  acetaminophen  (TYLENOL ) 325 MG tablet Take 2 tablets (650 mg total) by mouth every 6 (six) hours as needed for mild pain (pain score 1-3). 02/08/24  Yes Raguel Con RAMAN, PA-C  amiodarone  (PACERONE ) 200 MG tablet Continue taking 1 tablet (200 mg) by mouth  twice a day until 12.12.25, then take 1 tablet (200mg ) by mouth once a day. 03/22/24  Yes Christobal Guadalajara, MD  apixaban  (ELIQUIS ) 5 MG TABS tablet Take 1 tablet (5 mg total) by mouth 2 (two) times daily. 02/10/24  Yes Terra Fairy PARAS, PA-C  carvedilol  (COREG ) 6.25 MG tablet Take 1 tablet (6.25 mg total) by mouth 2 (two) times daily. 11/19/23  Yes Cindie Ole DASEN, MD  Cholecalciferol (VITAMIN D3) 25 MCG (1000 UT) CAPS Take 1,000 Units by mouth at bedtime.   Yes [provider]  clonazePAM  (KLONOPIN ) 0.5 MG tablet TAKE 1 TABLET BY MOUTH ONCE DAILY AS NEEDED FOR ANXIETY/ SLEEP 09/07/19  Yes Stuart Vernell Norris, PA-C  cyanocobalamin (VITAMIN B12) 1000 MCG tablet Take 1,000 mcg by mouth at bedtime.   Yes [provider]  furosemide  (LASIX ) 40 MG tablet Take 1 tablet (40 mg total) by mouth daily. 03/22/24  Yes Christobal Guadalajara, MD  levothyroxine  (SYNTHROID ) 112 MCG tablet Take 1 tablet by mouth once daily 04/28/20  Yes Cannady, Jolene T, NP  lovastatin  (MEVACOR ) 20 MG tablet TAKE 1 TABLET BY MOUTH AT BEDTIME 03/29/20  Yes Johnson, Megan P, DO  oxyCODONE  (OXY IR/ROXICODONE ) 5 MG immediate release tablet Take 1 tablet (5 mg total) by mouth every 6 (six) hours as needed for severe pain (pain score 7-10). 02/08/24  Yes Raguel Con RAMAN, PA-C  potassium chloride  (KLOR-CON  M) 10 MEQ tablet Take 1 tablet (10 mEq total) by mouth daily. Patient taking differently: Take 10 mEq by mouth as needed (take with Lasix ). 03/01/24  Yes Rutha Manuelita HERO, PA-C  sacubitril -valsartan  (ENTRESTO ) 24-26 MG Take (1/2) tablet twice daily 03/30/24  Yes Marcine, Brittainy M, PA-C  spironolactone  (ALDACTONE ) 25 MG tablet Take 12.5 mg by mouth daily.   Yes [provider]    Physical Exam: Vitals:   04/03/24 1212 04/03/24 1300 04/03/24 1345 04/03/24 1500  BP:  90/61 (!) 88/58 (!) 88/62  Pulse:  80 83 82  Resp:  (!) 26 (!) 28 20  Temp:      TempSrc:      SpO2:  96% 97% 97%  Weight: 80.7 kg     Height: 5' 4  (1.626 m)         Constitutional: Elderly female currently in no acute distress Eyes: PERRL, lids and conjunctivae  normal ENMT: Mucous membranes are moist.  Normal dentition.  Neck: normal, supple,  Respiratory: clear to auscultation bilaterally, no wheezing, no crackles. Normal respiratory effort. No accessory muscle use.  Cardiovascular: Regular rate and rhythm, no murmurs / rubs / gallops. No extremity edema.   Abdomen: no tenderness, no masses palpated. Bowel sounds positive.  Musculoskeletal: no clubbing / cyanosis. No joint deformity upper and lower extremities. Good ROM, no contractures. Normal muscle tone.  Skin: no rashes, lesions, ulcers.  Neurologic: CN 2-12 grossly intact.  Strength 5/5 in all 4.  Psychiatric: Normal judgment and insight. Alert and oriented x 3. Normal mood.   Data Reviewed:  EKG revealed atrial sensed and ventricularly paced rhythm at 83 bpm.  Reviewed labs, imaging, and pertinent records as documented.  Assessment and Plan:  Combined systolic and diastolic CHF Pleural effusions Acute on chronic.  Patient presents with complaints of malaise.  Chest x-ray noting table cardiomegaly with stable left pleural effusion with possible and new small right pleural effusion with vascular congestion. last echocardiogram noted EF to be 20 to 25% with global hypokinesis, severe LVH, grade 3 diastolic dysfunction with apical akinesis concerning for early thrombus formation. - Admit to a telemetry bed - Strict I&O's and daily weights - Check emnAWE(8037) -Question - Cardiology consulted to evaluate for further recommendations.  Malaise Patient attributes feelings of being unwell, headache, myalgias to being started on amiodarone .  She reports not taking the medication today and feeling much better. - Evaluating for other possible causes of symptoms at this time such as hyponatremia versus viral infection as cause of patient's symptoms versus  other  Leukocytosis Acute.   WBC elevated 11.5.  Chest x-ray with stable left pleural effusion and basilar opacity.  Influenza, COVID-19, RSV screening were negative. - Check procalcitonin - Follow-up urinalysis - Add-on complete respiratory virus panel  Elevated troponin Patient did report intermittently having chest pains.  High-sensitivity troponins are mildly elevated at 72->67. - Continue to monitor  Hypotension Acute.  Blood pressure was noted to be as low as 80/57. - Goal MAP greater than 65 - Holding home blood pressure regimen at this time  Persistent atrial fibrillation/flutter Patient was transition to amiodarone  on 12/6.  Patient reports having significant complications related to amiodarone . - Continue Eliquis  - Defer amiodarone  to cardiology  Hyponatremia Acute.  Sodium noted to be acutely down to 126 question is secondary to patient being acutely fluid overloaded at this time.  Sodium levels had previously improved with IV diuresis. - Continue to monitor  Acute kidney injury Creatinine noted to be 1.55 with BUN 41.  Thought possibly secondary to hypoperfusion. - Hold possible nephrotoxic agents - Follow-up urinalysis  Hypothyroidism TSH noted to be 3.121 when checked on 12/3. - Continue levothyroxine    Anxiety - Continue clonazepam  as needed for anxiety.  Hyperlipidemia - Continue pravastatin   DVT prophylaxis: Eliquis  Advance Care Planning:   Code Status: Prior    Consults: Cardiology  Family Communication: Family updated at bedside  Severity of Illness: The appropriate patient status for this patient is INPATIENT. Inpatient status is judged to be reasonable and necessary in order to provide the required intensity of service to ensure the patient's safety. The patient's presenting symptoms, physical exam findings, and initial radiographic and laboratory data in the context of their chronic comorbidities is felt to place them at high risk for further  clinical deterioration. Furthermore, it is not anticipated that the patient will be medically stable for discharge from the hospital within 2 midnights  of admission.   * I certify that at the point of admission it is my clinical judgment that the patient will require inpatient hospital care spanning beyond 2 midnights from the point of admission due to high intensity of service, high risk for further deterioration and high frequency of surveillance required.*  Author: Maximino DELENA Sharps, MD 04/03/2024 3:09 PM  For on call review [email or web address].      [1]  Allergies Allergen Reactions   Marshmallow [Althaea Officinalis]     Rash, hives, itching, SOB  Pt states this is no longer an issue for her   Other     Jello - Rash, hives, itching, SOB  Pt states is no longer an issue for her   Latex Rash   Nickel Rash   "

## 2024-04-03 NOTE — ED Notes (Signed)
PT aware a urine sample is needed.

## 2024-04-03 NOTE — ED Notes (Signed)
 CCMD called and patient setup for telemetry. CCMD made aware that patient went to XRAY and will be connected when she returns.

## 2024-04-03 NOTE — ED Provider Notes (Signed)
 " [facility] Provider Note   CSN: [id number] Arrival date & time: 04/03/24  1159     Patient presents with: Chest Pain   [name] [name] is a 68 y.o. female.   HPI 68 year old female presents with concern of side effects from amiodarone .  She was started on amiodarone  a couple weeks ago during her recent hospitalization.  Also had her Entresto  dose lowered.  She states over the last 4 days she has been having generalized fatigue, on and off frontal headache, nausea, poor appetite.  She is also been having some chest pain and over the last day or so has felt congested with some shortness of breath.  No new cough.  No leg swelling or diarrhea, but has been constipated.  Had a low-grade 99 temp but nothing over 100.  Prior to Admission medications  Medication Sig Start Date End Date Taking? Authorizing Provider  acetaminophen  (TYLENOL ) 325 MG tablet Take 2 tablets (650 mg total) by mouth every 6 (six) hours as needed for mild pain (pain score 1-3). 02/08/24  Yes Raguel Con RAMAN, PA-C  amiodarone  (PACERONE ) 200 MG tablet Continue taking 1 tablet (200 mg) by mouth twice a day until 12.12.25, then take 1 tablet (200mg ) by mouth once a day. 03/22/24  Yes Christobal Guadalajara, MD  apixaban  (ELIQUIS ) 5 MG TABS tablet Take 1 tablet (5 mg total) by mouth 2 (two) times daily. 02/10/24  Yes Terra Fairy PARAS, PA-C  carvedilol  (COREG ) 6.25 MG tablet Take 1 tablet (6.25 mg total) by mouth 2 (two) times daily. 11/19/23  Yes Cindie Ole DASEN, MD  Cholecalciferol (VITAMIN D3) 25 MCG (1000 UT) CAPS Take 1,000 Units by mouth at bedtime.   Yes [provider]  clonazePAM  (KLONOPIN ) 0.5 MG tablet TAKE 1 TABLET BY MOUTH ONCE DAILY AS NEEDED FOR ANXIETY/ SLEEP 09/07/19  Yes Stuart Vernell Norris, PA-C  cyanocobalamin (VITAMIN B12) 1000 MCG tablet Take 1,000 mcg by mouth at bedtime.   Yes [provider]  furosemide  (LASIX ) 40 MG tablet Take 1 tablet (40 mg total)  by mouth daily. 03/22/24  Yes Christobal Guadalajara, MD  levothyroxine  (SYNTHROID ) 112 MCG tablet Take 1 tablet by mouth once daily 04/28/20  Yes Cannady, Jolene T, NP  lovastatin  (MEVACOR ) 20 MG tablet TAKE 1 TABLET BY MOUTH AT BEDTIME 03/29/20  Yes Johnson, Megan P, DO  oxyCODONE  (OXY IR/ROXICODONE ) 5 MG immediate release tablet Take 1 tablet (5 mg total) by mouth every 6 (six) hours as needed for severe pain (pain score 7-10). 02/08/24  Yes Raguel Con RAMAN, PA-C  potassium chloride  (KLOR-CON  M) 10 MEQ tablet Take 1 tablet (10 mEq total) by mouth daily. Patient taking differently: Take 10 mEq by mouth as needed (take with Lasix ). 03/01/24  Yes Rutha Manuelita HERO, PA-C  sacubitril -valsartan  (ENTRESTO ) 24-26 MG Take (1/2) tablet twice daily 03/30/24  Yes Marcine Catalan M, PA-C  spironolactone  (ALDACTONE ) 25 MG tablet Take 12.5 mg by mouth daily.   Yes [provider]    Allergies: Marshmallow [althaea officinalis], Other, Latex, and Nickel    Review of Systems  Constitutional:  Positive for fatigue. Negative for fever.  Respiratory:  Positive for shortness of breath.   Cardiovascular:  Positive for chest pain. Negative for leg swelling.  Gastrointestinal:  Positive for constipation and nausea. Negative for abdominal pain, diarrhea and vomiting.  Neurological:  Positive for headaches.    Updated Vital Signs BP (!) 110/57 (BP Location: Left Arm)   Pulse 80  Temp 98 F (36.7 C) (Oral)   Resp 18   Ht 5' 4 (1.626 m)   Wt 81.1 kg   LMP  (LMP Unknown)   SpO2 98%   BMI 30.69 kg/m   Physical Exam Vitals and nursing note reviewed.  Constitutional:      Appearance: She is well-developed.  HENT:     Head: Normocephalic and atraumatic.     Mouth/Throat:     Mouth: Mucous membranes are dry.  Eyes:     Pupils: Pupils are equal, round, and reactive to light.  Cardiovascular:     Rate and Rhythm: Normal rate and regular rhythm.     Heart sounds: Normal heart sounds.  Pulmonary:      Effort: Pulmonary effort is normal. No accessory muscle usage.     Breath sounds: Examination of the left-lower field reveals decreased breath sounds. Decreased breath sounds present. No wheezing, rhonchi or rales.  Abdominal:     Palpations: Abdomen is soft.     Tenderness: There is no abdominal tenderness.  Musculoskeletal:     Cervical back: No rigidity.  Skin:    General: Skin is warm and dry.  Neurological:     Mental Status: She is alert.     Comments: Awake, alert, clear speech. No facial droop. 5/5 strength in all 4 extremities. Grossly normal sensation.     (all labs ordered are listed, but only abnormal results are displayed) Labs Reviewed  BASIC METABOLIC PANEL WITH GFR - Abnormal; Notable for the following components:      Result Value   Sodium 126 (*)    Chloride 91 (*)    Glucose, Bld 162 (*)    BUN 41 (*)    Creatinine, Ser 1.55 (*)    GFR, Estimated 37 (*)    All other components within normal limits  CBC - Abnormal; Notable for the following components:   WBC 11.5 (*)    RBC 3.84 (*)    Hemoglobin 11.5 (*)    HCT 34.3 (*)    All other components within normal limits  PROTIME-INR - Abnormal; Notable for the following components:   Prothrombin Time 24.2 (*)    INR 2.1 (*)    All other components within normal limits  URINALYSIS, ROUTINE W REFLEX MICROSCOPIC - Abnormal; Notable for the following components:   Bilirubin Urine SMALL (*)    All other components within normal limits  HEPATIC FUNCTION PANEL - Abnormal; Notable for the following components:   Total Protein 6.1 (*)    Albumin 3.1 (*)    Bilirubin, Direct 0.3 (*)    All other components within normal limits  PRO BRAIN NATRIURETIC PEPTIDE - Abnormal; Notable for the following components:   Pro Brain Natriuretic Peptide 1,962.0 (*)    All other components within normal limits  SODIUM - Abnormal; Notable for the following components:   Sodium 126 (*)    All other components within normal limits   TROPONIN T, HIGH SENSITIVITY - Abnormal; Notable for the following components:   Troponin T High Sensitivity 72 (*)    All other components within normal limits  TROPONIN T, HIGH SENSITIVITY - Abnormal; Notable for the following components:   Troponin T High Sensitivity 67 (*)    All other components within normal limits  RESP PANEL BY RT-PCR (RSV, FLU A&B, COVID)  [id number]  RESPIRATORY PANEL BY PCR  CBC  BASIC METABOLIC PANEL WITH GFR  PROCALCITONIN    EKG: EKG Interpretation Date/Time:  Sunday  April 03 2024 12:18:09 EST Ventricular Rate:  83 PR Interval:  196 QRS Duration:  152 QT Interval:  422 QTC Calculation: 495 R Axis:   224  Text Interpretation: Atrial-sensed ventricular-paced rhythm Abnormal ECG Confirmed by Freddi Hamilton [phone]) on 04/03/2024 12:49:17 PM  Radiology: CT Head Wo Contrast Result Date: 04/03/2024 EXAM: CT HEAD WITHOUT CONTRAST 04/03/2024 02:18:17 PM TECHNIQUE: CT of the head was performed without the administration of intravenous contrast. Automated exposure control, iterative reconstruction, and/or weight based adjustment of the mA/kV was utilized to reduce the radiation dose to as low as reasonably achievable. COMPARISON: None available. CLINICAL HISTORY: Headache, new onset (Age >= 51y). FINDINGS: BRAIN AND VENTRICLES: No acute hemorrhage. No evidence of acute infarct. No hydrocephalus. No extra-axial collection. No mass effect or midline shift. Atherosclerotic calcifications are present in the cavernous carotid arteries bilaterally and at the dural margin of both vertebral arteries. No hyperdense vessel is present. ORBITS: No acute abnormality. SINUSES: No acute abnormality. SOFT TISSUES AND SKULL: No acute soft tissue abnormality. No skull fracture. IMPRESSION: 1. No acute intracranial abnormality. Electronically signed by: Lonni Necessary MD 04/03/2024 02:21 PM EST RP Workstation: [id number]   DG Chest 2 View Result Date: 04/03/2024 CLINICAL DATA:   Chest pain. EXAM: CHEST - 2 VIEW COMPARISON:  Radiograph 03/17/2024. 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 Cardiomegaly is stable. Stable left pleural effusion and basilar opacity. There is a new small right pleural effusion. Vascular congestion. No pneumothorax. No acute osseous findings. IMPRESSION: 1. Stable cardiomegaly. 2. Stable left pleural effusion and basilar opacity. 3. New small right pleural effusion.  Vascular congestion. Electronically Signed   By: Andrea Gasman M.D.   On: 04/03/2024 13:30     Procedures   Medications Ordered in the ED  levothyroxine  (SYNTHROID ) tablet 112 mcg (has no administration in time range)  apixaban  (ELIQUIS ) tablet 5 mg (has no administration in time range)  clonazePAM  (KLONOPIN ) tablet 0.5 mg (has no administration in time range)  acetaminophen  (TYLENOL ) tablet 650 mg (has no administration in time range)    Or  acetaminophen  (TYLENOL ) suppository 650 mg (has no administration in time range)  albuterol  (PROVENTIL ) (2.5 MG/3ML) 0.083% nebulizer solution 2.5 mg (has no administration in time range)  oxyCODONE  (Oxy IR/ROXICODONE ) immediate release tablet 5 mg (has no administration in time range)  pravastatin  (PRAVACHOL ) tablet 20 mg (has no administration in time range)  lactated ringers  bolus 250 mL (0 mLs Intravenous Stopped 04/03/24 1600)  lactated ringers  bolus 500 mL (0 mLs Intravenous Stopped 04/03/24 1828)                                    Medical Decision Making Amount and/or Complexity of Data Reviewed Labs: ordered.    Details: Acute kidney injury Radiology: ordered and independent interpretation performed.    Details: Pleural effusion ECG/medicine tests: ordered and independent interpretation performed.    Details: Paced rhythm  Risk Decision regarding hospitalization.   Patient presents with soft blood pressures and generalized weakness.  Has an acute kidney injury and  some mild hyponatremia.  Troponin is mildly elevated but repeat is similar.  I do not think this is ACS.  Head CT benign.  Given some gentle fluids for her hypotension.  Will likely need medication adjustments.  I do not think this is sepsis.  Discussed with Dr. Claudene for admission.     Final diagnoses:  Acute kidney injury    ED Discharge Orders     None          Freddi Hamilton, MD 04/03/24 2033  "

## 2024-04-03 NOTE — Consult Note (Addendum)
 "  Cardiology Consultation   Patient ID: [name] [name] MRN: [id number]; DOB: 11/08/1955  Admit date: 04/03/2024 Date of Consult: 04/03/2024  PCP:  Sherial Bail, MD   [facility] Providers Cardiologist:  None  Electrophysiologist:  OLE ONEIDA HOLTS, MD       Patient Profile: [name] [name] is a 68 y.o. female with a hx of NICM (EF 20-25%),  who is being seen 04/03/2024 for the evaluation of subjective fatigue at the request of Dr. Claudene [facility]).    History of Present Illness: [name] [name] is a 68 year old female with a history notable for nonischemic cardiomyopathy with an EF of 20 to 25%), high-grade AV block status post pacemaker, persistent A-fib, hypertension.  We have been consulted to rule out a cardiac etiology fatigue.  Patient reports that she had clinical improvement at the time of discharge from her most recent hospitalization.  Was in her usual state of health until about 5 days ago where she noted diffuse musculoskeletal aches.  She also had 1 day of chills for which she left work early, and laid in the bed for the vast majority of the day on Thursday, 03/31/2024.  She notes she has not had as much energy as usual-and ultimately presented to the emergency department today for further evaluation.  She ascribes her symptoms to amiodarone .  In terms of her heart failure, states her breathing has been sufficient enough for her to walk and go to work without significant dyspnea on exertion.  No orthopnea, PND, lower extremity edema or abdominal bloating.  She denies palpitations or substernal chest pain.  Her musculoskeletal aches as mentioned above her right sided/related to her right sided ribs.  Note: She was hospitalized with acute decompensated heart failure at the beginning of December-underwent left and right heart cath-normal coronaries (RA 16, RV 45/6, PA 43/26/33, PCWP cannot be obtained, LVEDP 23, CI 2.8).  Optimized on medical therapy but could not  tolerate Entresto  or SGLT2 due to hypotension.  She was also noted to be in atrial flutter during that admission with plan to undergo ablation in 2026-though has been on amiodarone  and attempt to control her rhythm.  On arrival to the ED she was noted to be afebrile, hemodynamically stable in sinus rhythm with heart rates in the 80s-blood pressure ranging from 80s to 90s over 50s to 60s.  Labs notable for sodium of 126-slightly downtrending from her clinic appointment at 138.  Creatinine of 1.55-increased from 1.1 during her clinic visit.  proBNP elevated at 1900-previous BNP with a value of 500 on 12/3 at the time of her previous admission.    Past Medical History:  Diagnosis Date   AICD (automatic cardioverter/defibrillator) present    St. Jude   Anxiety    takes Klonopin  daily as needed   Arthritis    Back pain    reason unknown   Chronic systolic CHF (congestive heart failure) (HCC)    a. Echo 10/2013: EF 40-45 (apical images 35-40%); b. Echo 12/2014: EF 35-40%; c. 06/2020 Echo: EF 25-30%, glob HK, Gr2 DD, RVSP ~ ; d. 10/2020 Echo: EF 25-30%, glob HK. Nl RV fxn, nl PASP, mildly dil LA. Small peric eff, triv MR.   Complete AV block (HCC)    a. s/p pacemaker insertion 1996; b. s/p contralateral side St. Jude generator change 11/2014.   Dizziness    occasionally   GERD (gastroesophageal reflux disease)    takes Zantac  daily as needed   Gout    Headache  occasionally   History of blood clots    in heart-was on Coumadin---this many yrs ago   History of colon polyps    benign   Hyperlipidemia    takes Lovastatin  daily   Hypertension    hx of-since hip replacement MD took resident off meds 10/30/14 b/c well controlled   Hypothyroid    takes Synthroid  daily   Joint pain    Joint swelling    Moderate Pericardial effusion    a. 07/2020 Echo: Mod effusion w/o tamponade; b. 10/2020 Echo: small peric eff w/o tamponade.   NICM (nonischemic cardiomyopathy) (HCC)    a. tachycardia  induced->EF 40-45% (2010), 35-40% (10/2013), 35-40% Gr1 DD, mod TR (12/2014);  b. 01/2015 Cath: Nl cors, EF 30-35%, glob HK; c. 06/2020 Echo: EF 25-30%; d. 10/2020 Echo: EF 25-30%.   Numbness and tingling    left leg and right arm   Ovarian cancer (HCC)    ovarian, skin    Pacemaker lead failure--noise on atrial greater than ventricular lead with ventricular backup pulse pacing 10/17/2014   Peripheral artery disease    stent on lt illiac    Past Surgical History:  Procedure Laterality Date   ABDOMINAL HYSTERECTOMY     ANGIOPLASTY / STENTING ILIAC     BIV UPGRADE N/A 12/29/2023   Procedure: BIV UPGRADE;  Surgeon: Cindie Ole DASEN, MD;  Location: [facility] INVASIVE CV LAB;  Service: Cardiovascular;  Laterality: N/A;   CARDIAC CATHETERIZATION N/A 02/08/2015   Procedure: Left Heart Cath and Coronary Angiography;  Surgeon: Deatrice DELENA Cage, MD;  Location: [facility] INVASIVE CV LAB;  Service: Cardiovascular;  Laterality: N/A;   CARDIOVERSION N/A 03/07/2024   Procedure: CARDIOVERSION;  Surgeon: Raford Riggs, MD;  Location: [facility] INVASIVE CV LAB;  Service: Cardiovascular;  Laterality: N/A;   CERVICAL CERCLAGE  1994   COLONOSCOPY     DG BARIUM SWALLOW ([facility] HX)     DILATION AND CURETTAGE OF UTERUS     EP IMPLANTABLE DEVICE Left 12/06/2014   Procedure: Implantation of cardiac resynchronization therapy pacemaker;  Surgeon: Danelle LELON Birmingham, MD;  Location: [facility] OR;  Service: Cardiovascular;  Laterality: Left;   EPICARDIAL PACING LEAD PLACEMENT N/A 02/05/2024   Procedure: INSERTION, EPICARDIAL ELECTRODE LEAD;  Surgeon: Lucas Dorise POUR, MD;  Location: [facility] OR;  Service: Thoracic;  Laterality: N/A;   HERNIA MESH REMOVAL Right    incisional   HERNIA REPAIR     ILIAC ARTERY STENT  04/05/2010   Left artery stenting   INSERT / REPLACE / REMOVE PACEMAKER     JOINT REPLACEMENT  10/31/2014   hip   KNEE SURGERY Right    LEAD INSERTION N/A 12/29/2023   Procedure: LEAD INSERTION;  Surgeon: Cindie Ole DASEN, MD;   Location: [facility] INVASIVE CV LAB;  Service: Cardiovascular;  Laterality: N/A;   PACEMAKER INSERTION  2012   Had replaced in July 2012, first placed in 1996   PACEMAKER LEAD REMOVAL Right 12/06/2014   Procedure: REMOVAL OF RV AND RA PACEMAKER LEADS;  Surgeon: Danelle LELON Birmingham, MD;  Location: [facility] OR;  Service: Cardiovascular;  Laterality: Right;  DR. BARTLE TO BACK UP    PACEMAKER PLACEMENT Left 12/06/2014   RIGHT/LEFT HEART CATH AND CORONARY ANGIOGRAPHY N/A 03/18/2024   Procedure: RIGHT/LEFT HEART CATH AND CORONARY ANGIOGRAPHY;  Surgeon: Elmira Newman PARAS, MD;  Location: [facility] INVASIVE CV LAB;  Service: Cardiovascular;  Laterality: N/A;   THORACOTOMY Left 02/05/2024   Procedure: THORACOTOMY, MAJOR;  Surgeon: Lucas Dorise POUR, MD;  Location: [facility] OR;  Service: Thoracic;  Laterality: Left;   TOTAL ABDOMINAL HYSTERECTOMY W/ BILATERAL SALPINGOOPHORECTOMY  07/2008   Ovarian cancer   TOTAL HIP ARTHROPLASTY Right 10/31/2014   Procedure: TOTAL HIP ARTHROPLASTY ANTERIOR APPROACH;  Surgeon: Ozell Flake, MD;  Location: [facility] ORS;  Service: Orthopedics;  Laterality: Right;       Scheduled Meds:  apixaban   5 mg Oral BID   carvedilol   6.25 mg Oral BID   clonazePAM   0.5 mg Oral BID   [START ON 04/04/2024] levothyroxine   112 mcg Oral Q0600   Continuous Infusions:  PRN Meds: acetaminophen  **OR** acetaminophen , albuterol   Allergies:   Allergies[1]  Social History:   Social History   Socioeconomic History   Marital status: Married    Spouse name: Not on file   Number of children: Not on file   Years of education: Not on file   Highest education level: Not on file  Occupational History   Not on file  Tobacco Use   Smoking status: Former    Current packs/day: 0.00    Types: Cigarettes    Quit date: 07/17/2008    Years since quitting: 15.7   Smokeless tobacco: Never   Tobacco comments:    Former smoker 02/10/24  Vaping Use   Vaping status: Never Used  Substance and Sexual Activity   Alcohol use:  Not Currently    Comment: rum daily-previously drank beer daily   Drug use: No   Sexual activity: Not Currently    Birth control/protection: Surgical  Other Topics Concern   Not on file  Social History Narrative   Not on file   Social Drivers of Health   Tobacco Use: Medium Risk (04/03/2024)   Patient History    Smoking Tobacco Use: Former    Smokeless Tobacco Use: Never    Passive Exposure: Not on file  Financial Resource Strain: Low Risk  (08/19/2023)   Received from [facility]   Overall Financial Resource Strain (CARDIA)    Difficulty of Paying Living Expenses: Not hard at all  Food Insecurity: No Food Insecurity (03/16/2024)   Epic    Worried About Radiation Protection Practitioner of Food in the Last Year: Never true    Ran Out of Food in the Last Year: Never true  Transportation Needs: No Transportation Needs (03/16/2024)   Epic    Lack of Transportation (Medical): No    Lack of Transportation (Non-Medical): No  Physical Activity: Not on file  Stress: Not on file  Social Connections: Socially Integrated (03/16/2024)   Social Connection and Isolation Panel    Frequency of Communication with Friends and Family: More than three times a week    Frequency of Social Gatherings with Friends and Family: Once a week    Attends Religious Services: More than 4 times per year    Active Member of [employer] or Organizations: Yes    Attends Banker Meetings: More than 4 times per year    Marital Status: Married  Catering Manager Violence: Not At Risk (03/16/2024)   Epic    Fear of Current or Ex-Partner: No    Emotionally Abused: No    Physically Abused: No    Sexually Abused: No  Depression (PHQ2-9): Not on file  Alcohol Screen: Not on file  Housing: Low Risk (03/16/2024)   Epic    Unable to Pay for Housing in the Last Year: No    Number of Times Moved in the Last Year: 0    Homeless in the Last Year:  No  Utilities: Not At Risk (03/16/2024)   Epic    Threatened with loss of  utilities: No  Health Literacy: Not on file    Family History:    Family History  Problem Relation Age of Onset   Hyperlipidemia Mother    Polymyalgia rheumatica Mother    Cancer Father        died of lung cancer   Lung cancer Father    Breast cancer Sister    Breast cancer Paternal Aunt    Diabetes Brother    Arrhythmia Son      ROS:  Please see the history of present illness.   All other ROS reviewed and negative.     Physical Exam/Data: Vitals:   04/03/24 1747 04/03/24 1800 04/03/24 1830 04/03/24 1845  BP: (!) 85/48 (!) 95/56 (!) 86/58 (!) 94/50  Pulse: 80 80 73 75  Resp: (!) 24     Temp:      TempSrc:      SpO2: 96% 98% 97%   Weight:      Height:        Intake/Output Summary (Last 24 hours) at 04/03/2024 1852 Last data filed at 04/03/2024 1600 Gross per 24 hour  Intake 250 ml  Output --  Net 250 ml      04/03/2024   12:12 PM 03/29/2024    1:30 PM 03/22/2024    5:51 AM  Last 3 Weights  Weight (lbs) 178 lb 177 lb 12.8 oz 176 lb 12.9 oz  Weight (kg) 80.74 kg 80.65 kg 80.2 kg     Body mass index is 30.55 kg/m.  General:  Well nourished, well developed, in no acute distress HEENT: normal Neck: no JVD Cardiac: Regular rate and rhythm appreciable murmurs rubs or gallops, normal S1-S2, no S3 Lungs:  clear to auscultation bilaterally, no wheezing, rhonchi or rales  Abd: soft, nontender, no hepatomegaly  Ext: no edema Musculoskeletal: Bilateral lower extremities are warm to touch.  Her pacemaker site appears to be clean dry and intact there, very faint erythema over the lower portion of her chest without tenderness to palpation Neuro: Alert and conversant Psych:  Normal affect   EKG:  The EKG was personally reviewed and demonstrates: A sensed V paced rhythm without flutter waves-does have upright T waves in leads I and II suggestive of sinus rhythm   Relevant CV Studies: TTE 03/16/2024  1. Left ventricular ejection fraction, by estimation, is 20 to 25%.  The  left ventricle has severely decreased function. The left ventricle  demonstrates global hypokinesis. The left ventricular internal cavity size  was severely dilated. There is severe  eccentric left ventricular hypertrophy. Left ventricular diastolic  parameters are consistent with Grade III diastolic dysfunction  (restrictive). There is severe global hypokinesis with apical akinesis and  swerlling of contrast concerning for early  thrombus formation.   2. Right ventricular systolic function is mildly reduced. The right  ventricular size is normal. There is normal pulmonary artery systolic  pressure.   3. Left atrial size was mildly dilated.   4. Lead in RA.   5. The mitral valve is normal in structure. No evidence of mitral valve  regurgitation.   6. At least moderate TR possibly in setting of lead impingement.  Tricuspid valve regurgitation is moderate.   7. The aortic valve is normal in structure. Aortic valve regurgitation is  not visualized.   8. The inferior vena cava is dilated in size with <50% respiratory  variability, suggesting right  atrial pressure of 15 mmHg.    Laboratory Data: High Sensitivity Troponin:   Recent Labs  Lab 03/15/24 0836 03/15/24 1022  TROPONINIHS 24* 21*     Chemistry Recent Labs  Lab 03/29/24 1437 04/03/24 1239 04/03/24 1805  NA 138 126* 126*  K 3.9 3.8  --   CL 100 91*  --   CO2 25 24  --   GLUCOSE 102* 162*  --   BUN 22 41*  --   CREATININE 1.18* 1.55*  --   CALCIUM  10.0 9.3  --   GFRNONAA 51* 37*  --   ANIONGAP 12 11  --     Recent Labs  Lab 04/03/24 1414  PROT 6.1*  ALBUMIN 3.1*  AST 28  ALT 14  ALKPHOS 85  BILITOT 0.6   Lipids No results for input(s): CHOL, TRIG, HDL, LABVLDL, LDLCALC, CHOLHDL in the last 168 hours.  Hematology Recent Labs  Lab 04/03/24 1239  WBC 11.5*  RBC 3.84*  HGB 11.5*  HCT 34.3*  MCV 89.3  MCH 29.9  MCHC 33.5  RDW 14.4  PLT 243   Thyroid  No results for input(s): TSH,  FREET4 in the last 168 hours.  BNP Recent Labs  Lab 04/03/24 1414  PROBNP 1,962.0*    DDimer No results for input(s): DDIMER in the last 168 hours.  Radiology/Studies:  CT Head Wo Contrast Result Date: 04/03/2024 EXAM: CT HEAD WITHOUT CONTRAST 04/03/2024 02:18:17 PM TECHNIQUE: CT of the head was performed without the administration of intravenous contrast. Automated exposure control, iterative reconstruction, and/or weight based adjustment of the mA/kV was utilized to reduce the radiation dose to as low as reasonably achievable. COMPARISON: None available. CLINICAL HISTORY: Headache, new onset (Age >= 51y). FINDINGS: BRAIN AND VENTRICLES: No acute hemorrhage. No evidence of acute infarct. No hydrocephalus. No extra-axial collection. No mass effect or midline shift. Atherosclerotic calcifications are present in the cavernous carotid arteries bilaterally and at the dural margin of both vertebral arteries. No hyperdense vessel is present. ORBITS: No acute abnormality. SINUSES: No acute abnormality. SOFT TISSUES AND SKULL: No acute soft tissue abnormality. No skull fracture. IMPRESSION: 1. No acute intracranial abnormality. Electronically signed by: Lonni Necessary MD 04/03/2024 02:21 PM EST RP Workstation: [id number]   DG Chest 2 View Result Date: 04/03/2024 CLINICAL DATA:  Chest pain. EXAM: CHEST - 2 VIEW COMPARISON:  Radiograph 03/17/2024. 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 Cardiomegaly is stable. Stable left pleural effusion and basilar opacity. There is a new small right pleural effusion. Vascular congestion. No pneumothorax. No acute osseous findings. IMPRESSION: 1. Stable cardiomegaly. 2. Stable left pleural effusion and basilar opacity. 3. New small right pleural effusion.  Vascular congestion. Electronically Signed   By: Andrea Gasman M.D.   On: 04/03/2024 13:30     Assessment and Plan: Diffuse musculoskeletal aches with  fatigue Etiology is unclear-patient is concerned that this may be related to amiodarone .  Clinical suspicion for this is quite low at this time-she has been tolerating this therapy since early December without much in the way of side effects.  Also, she reports improvement in her symptoms today with holding a single dose-though she likely has a significant amount of amiodarone  in her system despite a single held dose.  Could consider alternative diagnoses such as systemic infection-as she has had some infectious symptoms over the past several days preceding her admission (including chills, diffuse musculoskeletal aches and fatigue).  She also has a new leukocytosis-which may  be suggestive of an infectious process.  Also, low concern for low output heart failure at this time, she is warm well-perfused-without peripheral edema-See CHF below.  Acute on Chronic combined systolic and diastolic heart failure Appears overall well compensated.  She was recently started on Entresto  in clinic on 03/29/24 in addition to Coreg /spironolactone .  She has tolerated this adjustment without symptomatic hypotension.  Of note-though clinically she does not appear to be volume overloaded-her chest x-ray does demonstrate some hilar congestion, pro-BNP elevated, and she has an AKI.  These are signs of mild hypervolemia with potential cardiorenal syndrome.  She may benefit from a dose of IV diuretic  -Continue home Coreg  6.25 mg twice daily, spironolactone  12.5 mg daily, Entresto  12/13 mg twice daily -Please give Lasix  80 IV x 1, we will continue to follow her volume status  Persistent atrial fibrillation, atrial flutter Prior documentation notes persistent atrial fibrillation-in atrial flutter at the time of her prior hospitalization.  ECG today does have evidence of discrete sinus appearing P waves in leads I and II-otherwise V paced.  As mentioned above, suspect that amiodarone  may not be playing a role in her overall  fatigue/myalgias.  If the patient is adamant about discontinuing this medication-may be prudent to discuss alternatives with electrophysiology given they have previously evaluated and plan for ablation as an outpatient. -Continue amiodarone  200 mg daily for now  Complete heart block status post PPM (1990s) S/p failed CRT upgrade 10/25-with subsequent RV ICD lead and epicardial LV lead (02/06/2024).   Risk Assessment/Risk Scores:     New York  Heart Association (NYHA) Functional Class NYHA Class II  CHA2DS2-VASc Score = 5   This indicates a 7.2% annual risk of stroke. The patient's score is based upon: CHF History: 1 HTN History: 1 Diabetes History: 0 Stroke History: 0 Vascular Disease History: 1 Age Score: 1 Gender Score: 1      For questions or updates, please contact [facility] Please consult [email or web address] for contact info under    Signed, Dorn Kapur, MD  04/03/2024 6:52 PM     [1]  Allergies Allergen Reactions   Marshmallow Romayne Officinalis]     Rash, hives, itching, SOB  Pt states this is no longer an issue for her   Other     Jello - Rash, hives, itching, SOB  Pt states is no longer an issue for her   Latex Rash   Nickel Rash   "

## 2024-04-03 NOTE — ED Notes (Signed)
 Breathing is even and unlabored.  Call light within reach encouraged to use when needs arise.

## 2024-04-04 DIAGNOSIS — I9589 Other hypotension: Secondary | ICD-10-CM

## 2024-04-04 DIAGNOSIS — I5043 Acute on chronic combined systolic (congestive) and diastolic (congestive) heart failure: Secondary | ICD-10-CM | POA: Diagnosis not present

## 2024-04-04 LAB — BASIC METABOLIC PANEL WITH GFR
Anion gap: 10 (ref 5–15)
BUN: 36 mg/dL — ABNORMAL HIGH (ref 8–23)
CO2: 25 mmol/L (ref 22–32)
Calcium: 9 mg/dL (ref 8.9–10.3)
Chloride: 96 mmol/L — ABNORMAL LOW (ref 98–111)
Creatinine, Ser: 1.09 mg/dL — ABNORMAL HIGH (ref 0.44–1.00)
GFR, Estimated: 56 mL/min — ABNORMAL LOW
Glucose, Bld: 103 mg/dL — ABNORMAL HIGH (ref 70–99)
Potassium: 3.6 mmol/L (ref 3.5–5.1)
Sodium: 131 mmol/L — ABNORMAL LOW (ref 135–145)

## 2024-04-04 LAB — CBC
HCT: 29.3 % — ABNORMAL LOW (ref 36.0–46.0)
Hemoglobin: 10.3 g/dL — ABNORMAL LOW (ref 12.0–15.0)
MCH: 30.9 pg (ref 26.0–34.0)
MCHC: 35.2 g/dL (ref 30.0–36.0)
MCV: 88 fL (ref 80.0–100.0)
Platelets: 237 K/uL (ref 150–400)
RBC: 3.33 MIL/uL — ABNORMAL LOW (ref 3.87–5.11)
RDW: 14.5 % (ref 11.5–15.5)
WBC: 6.7 K/uL (ref 4.0–10.5)
nRBC: 0 % (ref 0.0–0.2)

## 2024-04-04 MED ORDER — SACUBITRIL-VALSARTAN 24-26 MG PO TABS: 1.0000 | ORAL_TABLET | Freq: Two times a day (BID) | ORAL | Status: DC

## 2024-04-04 MED ORDER — SPIRONOLACTONE 12.5 MG HALF TABLET
12.5000 mg | ORAL_TABLET | Freq: Every day | ORAL | Status: DC
  Administered 2024-04-04 – 2024-04-05 (×2): 12.5 mg via ORAL
  Filled 2024-04-04 (×2): qty 1

## 2024-04-04 MED ORDER — AMIODARONE HCL 200 MG PO TABS
200.0000 mg | ORAL_TABLET | Freq: Every day | ORAL | Status: DC
  Administered 2024-04-04 – 2024-04-05 (×2): 200 mg via ORAL
  Filled 2024-04-04 (×2): qty 1

## 2024-04-04 MED ORDER — SENNA 8.6 MG PO TABS
1.0000 | ORAL_TABLET | Freq: Every day | ORAL | Status: DC
  Administered 2024-04-04 – 2024-04-05 (×2): 8.6 mg via ORAL
  Filled 2024-04-04 (×2): qty 1

## 2024-04-04 MED ORDER — FUROSEMIDE 40 MG PO TABS: 40.0000 mg | ORAL_TABLET | Freq: Every day | ORAL | Status: DC

## 2024-04-04 NOTE — Progress Notes (Signed)
 " PROGRESS NOTE    [name] [name]  FMW:[id number] DOB: 06/26/1955 DOA: 04/03/2024 PCP: Sherial Bail, MD  Subjective: Patient continues to feel weak and tired, and tearful about no improvement.   Hospital Course: 68 y.o. female with medical history significant of Hypertension, hyperlipidemia, systolic CHF (20-25%), atrial fibrillation, complete heart block s/p PPM, hypothyroidism, peripheral artery disease s/p iliac stent, and GERD who presents with malaise. CXR showed cardiomegaly, elevated pro-BNP and AKI, suggestive of mild hypervolemia and potential cardiorenal syndrome. She was seen by cardiology as well    Assessment and Plan:  Combined systolic and diastolic CHF Pleural effusions Acute on chronic.  Patient presents with complaints of malaise.  Chest x-ray noting table cardiomegaly with stable left pleural effusion with possible and new small right pleural effusion with vascular congestion. last echocardiogram noted EF to be 20 to 25% with global hypokinesis, severe LVH, grade 3 diastolic dysfunction with apical akinesis concerning for early thrombus formation. Pro-BNP was also elevated  - appreciate cardiology recommendations - holding lasix  for today - continue entresto , aldactone    Malaise Patient attributes feelings of being unwell, headache, myalgias to being started on amiodarone , which seems less likely per cardiology - hyponatremia likely contributing - continue to encourage ambulation     Leukocytosis - resolved    Elevated troponin Patient did report intermittently having chest pains.  High-sensitivity troponins are mildly elevated at 72->67. - seen by cardiology  - continue to monitor    Hypotension Resolved now, BP improved to the 120s/70s - entresto  was resumed, monitor BP - coreg  remains on hold for now    Persistent atrial fibrillation/flutter Patient was transition to amiodarone  on 12/6.  Patient reports having significant complications related to  amiodarone . - Continue Eliquis  - amiodarone  resumed per cardiology    Hyponatremia Likely hypervolemic hyponatremia  - improved with diuresis, continue to monitor    Acute kidney injury Creatinine noted to be 1.55 with BUN 41.  Thought possibly secondary to hypoperfusion/cardiorenal - Hold possible nephrotoxic agents - Renal function back to baseline with Cr 1.09 today    Hypothyroidism TSH noted to be 3.121 when checked on 12/3. - Continue levothyroxine     Anxiety - Continue clonazepam  as needed for anxiety.   Hyperlipidemia - Continue pravastatin     DVT prophylaxis:  apixaban  (ELIQUIS ) tablet 5 mg    Code Status: Full Code Family Communication: family updated at bedside Disposition Plan: Home Reason for continuing need for hospitalization: monitoring vitals  Objective: Vitals:   04/04/24 0420 04/04/24 0707 04/04/24 0802 04/04/24 1138  BP: (!) 102/58  (!) 120/56 121/72  Pulse: 87  88 96  Resp: 20  18 18   Temp: 98.5 F (36.9 C)  98.8 F (37.1 C) (!) 97.4 F (36.3 C)  TempSrc: Oral     SpO2: 97%  95% 97%  Weight:  80.5 kg    Height:        Intake/Output Summary (Last 24 hours) at 04/04/2024 1418 Last data filed at 04/04/2024 1000 Gross per 24 hour  Intake 250 ml  Output 1100 ml  Net -850 ml   Filed Weights   04/03/24 1212 04/03/24 2027 04/04/24 0707  Weight: 80.7 kg 81.1 kg 80.5 kg    Examination:  Physical Exam Vitals and nursing note reviewed.  Constitutional:      General: She is not in acute distress. Cardiovascular:     Rate and Rhythm: Normal rate.  Pulmonary:     Effort: No respiratory distress.     Breath  sounds: No wheezing.  Abdominal:     General: There is no distension.     Tenderness: There is no abdominal tenderness.  Musculoskeletal:     Right lower leg: No edema.     Left lower leg: No edema.     Data Reviewed: I have personally reviewed following labs and imaging studies  CBC: Recent Labs  Lab 04/03/24 1239  04/04/24 0226  WBC 11.5* 6.7  HGB 11.5* 10.3*  HCT 34.3* 29.3*  MCV 89.3 88.0  PLT 243 237   Basic Metabolic Panel: Recent Labs  Lab 03/29/24 1437 04/03/24 1239 04/03/24 1805 04/04/24 0226  NA 138 126* 126* 131*  K 3.9 3.8  --  3.6  CL 100 91*  --  96*  CO2 25 24  --  25  GLUCOSE 102* 162*  --  103*  BUN 22 41*  --  36*  CREATININE 1.18* 1.55*  --  1.09*  CALCIUM  10.0 9.3  --  9.0   GFR: Estimated Creatinine Clearance: 52.1 mL/min (A) (by C-G formula based on SCr of 1.09 mg/dL (H)). Liver Function Tests: Recent Labs  Lab 04/03/24 1414  AST 28  ALT 14  ALKPHOS 85  BILITOT 0.6  PROT 6.1*  ALBUMIN 3.1*   No results for input(s): LIPASE, AMYLASE in the last 168 hours. No results for input(s): AMMONIA in the last 168 hours. Coagulation Profile: Recent Labs  Lab 04/03/24 1239  INR 2.1*   Cardiac Enzymes: No results for input(s): CKTOTAL, CKMB, CKMBINDEX, TROPONINI in the last 168 hours. ProBNP, BNP (last 5 results) Recent Labs    03/15/24 0836 03/16/24 1120 04/03/24 1414  PROBNP  --   --  1,962.0*  BNP 741.8* 502.6*  --    HbA1C: No results for input(s): HGBA1C in the last 72 hours. CBG: No results for input(s): GLUCAP in the last 168 hours. Lipid Profile: No results for input(s): CHOL, HDL, LDLCALC, TRIG, CHOLHDL, LDLDIRECT in the last 72 hours. Thyroid  Function Tests: No results for input(s): TSH, T4TOTAL, FREET4, T3FREE, THYROIDAB in the last 72 hours. Anemia Panel: No results for input(s): VITAMINB12, FOLATE, FERRITIN, TIBC, IRON, RETICCTPCT in the last 72 hours. Sepsis Labs: Recent Labs  Lab 04/03/24 1805  PROCALCITON 0.69    Recent Results (from the past 240 hours)  Resp panel by RT-PCR (RSV, Flu A&B, Covid) Anterior Nasal Swab     Status: None   Collection Time: 04/03/24  1:33 PM   Specimen: Anterior Nasal Swab  Result Value Ref Range Status   SARS Coronavirus 2 by RT PCR NEGATIVE  NEGATIVE Final   Influenza A by PCR NEGATIVE NEGATIVE Final   Influenza B by PCR NEGATIVE NEGATIVE Final    Comment: (NOTE) The Xpert Xpress SARS-CoV-2/FLU/RSV plus assay is intended as an aid in the diagnosis of influenza from Nasopharyngeal swab specimens and should not be used as a sole basis for treatment. Nasal washings and aspirates are unacceptable for Xpert Xpress SARS-CoV-2/FLU/RSV testing.  Fact Sheet for Patients: bloggercourse.com  Fact Sheet for Healthcare Providers: seriousbroker.it  This test is not yet approved or cleared by the [place]  FDA and has been authorized for detection and/or diagnosis of SARS-CoV-2 by FDA under an Emergency Use Authorization (EUA). This EUA will remain in effect (meaning this test can be used) for the duration of the COVID-19 declaration under Section 564(b)(1) of the Act, 21 U.S.C. section 360bbb-3(b)(1), unless the authorization is terminated or revoked.     Resp Syncytial Virus by PCR NEGATIVE  NEGATIVE Final    Comment: (NOTE) Fact Sheet for Patients: bloggercourse.com  Fact Sheet for Healthcare Providers: seriousbroker.it  This test is not yet approved or cleared by the [place]  FDA and has been authorized for detection and/or diagnosis of SARS-CoV-2 by FDA under an Emergency Use Authorization (EUA). This EUA will remain in effect (meaning this test can be used) for the duration of the COVID-19 declaration under Section 564(b)(1) of the Act, 21 U.S.C. section 360bbb-3(b)(1), unless the authorization is terminated or revoked.  Performed at [facility], [address], [place], [place] [address]   Respiratory (~20 pathogens) panel by PCR     Status: None   Collection Time: 04/03/24  4:26 PM   Specimen: Nasopharyngeal Swab; Respiratory  Result Value Ref Range Status   Adenovirus NOT DETECTED NOT DETECTED Final    Coronavirus 229E NOT DETECTED NOT DETECTED Final    Comment: (NOTE) The Coronavirus on the Respiratory Panel, DOES NOT test for the novel  Coronavirus (2019 nCoV)    Coronavirus HKU1 NOT DETECTED NOT DETECTED Final   Coronavirus NL63 NOT DETECTED NOT DETECTED Final   Coronavirus OC43 NOT DETECTED NOT DETECTED Final   Metapneumovirus NOT DETECTED NOT DETECTED Final   Rhinovirus / Enterovirus NOT DETECTED NOT DETECTED Final   Influenza A NOT DETECTED NOT DETECTED Final   Influenza B NOT DETECTED NOT DETECTED Final   Parainfluenza Virus 1 NOT DETECTED NOT DETECTED Final   Parainfluenza Virus 2 NOT DETECTED NOT DETECTED Final   Parainfluenza Virus 3 NOT DETECTED NOT DETECTED Final   Parainfluenza Virus 4 NOT DETECTED NOT DETECTED Final   Respiratory Syncytial Virus NOT DETECTED NOT DETECTED Final   Bordetella pertussis NOT DETECTED NOT DETECTED Final   Bordetella Parapertussis NOT DETECTED NOT DETECTED Final   Chlamydophila pneumoniae NOT DETECTED NOT DETECTED Final   Mycoplasma pneumoniae NOT DETECTED NOT DETECTED Final    Comment: Performed at [facility], [address] [place], [place] [address]     Radiology Studies: CT Head Wo Contrast Result Date: 04/03/2024 EXAM: CT HEAD WITHOUT CONTRAST 04/03/2024 02:18:17 PM TECHNIQUE: CT of the head was performed without the administration of intravenous contrast. Automated exposure control, iterative reconstruction, and/or weight based adjustment of the mA/kV was utilized to reduce the radiation dose to as low as reasonably achievable. COMPARISON: None available. CLINICAL HISTORY: Headache, new onset (Age >= 51y). FINDINGS: BRAIN AND VENTRICLES: No acute hemorrhage. No evidence of acute infarct. No hydrocephalus. No extra-axial collection. No mass effect or midline shift. Atherosclerotic calcifications are present in the cavernous carotid arteries bilaterally and at the dural margin of both vertebral arteries. No hyperdense vessel is  present. ORBITS: No acute abnormality. SINUSES: No acute abnormality. SOFT TISSUES AND SKULL: No acute soft tissue abnormality. No skull fracture. IMPRESSION: 1. No acute intracranial abnormality. Electronically signed by: Lonni Necessary MD 04/03/2024 02:21 PM EST RP Workstation: [id number]   DG Chest 2 View Result Date: 04/03/2024 CLINICAL DATA:  Chest pain. EXAM: CHEST - 2 VIEW COMPARISON:  Radiograph 03/17/2024. 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 Cardiomegaly is stable. Stable left pleural effusion and basilar opacity. There is a new small right pleural effusion. Vascular congestion. No pneumothorax. No acute osseous findings. IMPRESSION: 1. Stable cardiomegaly. 2. Stable left pleural effusion and basilar opacity. 3. New small right pleural effusion.  Vascular congestion. Electronically Signed   By: Andrea Gasman M.D.   On: 04/03/2024 13:30    Scheduled Meds:  amiodarone   200 mg Oral Daily   apixaban   5 mg Oral BID   clonazePAM   0.5 mg Oral BID   levothyroxine   112 mcg Oral Q0600   pravastatin   20 mg Oral q1800   spironolactone   12.5 mg Oral Daily   Continuous Infusions:   LOS: 1 day   Time spent: 37 minutes  Casimer Dare, MD  [facility]  04/04/2024, 2:18 PM   "

## 2024-04-04 NOTE — Plan of Care (Signed)

## 2024-04-04 NOTE — Progress Notes (Signed)
 Heart Failure Navigator Progress Note  Assessed for Heart & Vascular TOC clinic readiness.  Patient does not meet criteria due to she is an Advanced Heart Failure Team patient of Dr. Zenaida. .   Navigator will sign off at this time.   Stephane Haddock, BSN, Scientist, Clinical (histocompatibility And Immunogenetics) Only

## 2024-04-04 NOTE — Progress Notes (Addendum)
 "   DAILY PROGRESS NOTE   Patient Name: [name] [name] Date of Encounter: 04/04/2024 Cardiologist: None  Chief Complaint   Feels stiff  Patient Profile   [name] [name] is a 68 y.o. female with a hx of NICM (EF 20-25%),  who is being seen 04/03/2024 for the evaluation of subjective fatigue at the request of Dr. Claudene [facility]).   Subjective   No shortness of breath or chest pain. Feels stiff, tearful today about being in the hospital again - not sure how to keep this from happening.  Good diuresis yesterday- creatinine improved today to 1.09 (from 1.55), sodium improved to 131. proBNP was 1962. Entresto  held for hypotension, but BP improved today. She was given 750 cc LR as well as 80 mg IV lasix .  Objective   Vitals:   04/03/24 2027 04/04/24 0026 04/04/24 0420 04/04/24 0802  BP:  (!) 97/41 (!) 102/58 (!) 120/56  Pulse:  72 87 88  Resp:  18 20 18   Temp:  98 F (36.7 C) 98.5 F (36.9 C) 98.8 F (37.1 C)  TempSrc:  Oral Oral   SpO2:  94% 97% 95%  Weight: 81.1 kg     Height: 5' 4 (1.626 m)       Intake/Output Summary (Last 24 hours) at 04/04/2024 9161 Last data filed at 04/04/2024 0500 Gross per 24 hour  Intake 250 ml  Output 800 ml  Net -550 ml   Filed Weights   04/03/24 1212 04/03/24 2027  Weight: 80.7 kg 81.1 kg    Physical Exam   General appearance: alert and no distress Neck: no carotid bruit, no JVD, and thyroid  not enlarged, symmetric, no tenderness/mass/nodules Lungs: clear to auscultation bilaterally Heart: regular rate and rhythm Abdomen: soft, non-tender; bowel sounds normal; no masses,  no organomegaly Extremities: extremities normal, atraumatic, no cyanosis or edema Pulses: 2+ and symmetric Skin: Skin color, texture, turgor normal. No rashes or lesions Neurologic: Grossly normal Psych: Tearful  Inpatient Medications    Scheduled Meds:  apixaban   5 mg Oral BID   clonazePAM   0.5 mg Oral BID   levothyroxine   112 mcg Oral Q0600    pravastatin   20 mg Oral q1800    Continuous Infusions:   PRN Meds: acetaminophen  **OR** acetaminophen , albuterol , oxyCODONE    Labs   Results for orders placed or performed during the hospital encounter of 04/03/24 (from the past 48 hours)  Basic metabolic panel     Status: Abnormal   Collection Time: 04/03/24 12:39 PM  Result Value Ref Range   Sodium 126 (L) 135 - 145 mmol/L   Potassium 3.8 3.5 - 5.1 mmol/L   Chloride 91 (L) 98 - 111 mmol/L   CO2 24 22 - 32 mmol/L   Glucose, Bld 162 (H) 70 - 99 mg/dL    Comment: Glucose reference range applies only to samples taken after fasting for at least 8 hours.   BUN 41 (H) 8 - 23 mg/dL   Creatinine, Ser 8.44 (H) 0.44 - 1.00 mg/dL   Calcium  9.3 8.9 - 10.3 mg/dL   GFR, Estimated 37 (L) >60 mL/min    Comment: (NOTE) Calculated using the CKD-EPI Creatinine Equation (2021)    Anion gap 11 5 - 15    Comment: Performed at [facility], [address] [place], [place] [address]  CBC     Status: Abnormal   Collection Time: 04/03/24 12:39 PM  Result Value Ref Range   WBC 11.5 (H) 4.0 - 10.5 K/uL  RBC 3.84 (L) 3.87 - 5.11 MIL/uL   Hemoglobin 11.5 (L) 12.0 - 15.0 g/dL   HCT 65.6 (L) 63.9 - 53.9 %   MCV 89.3 80.0 - 100.0 fL   MCH 29.9 26.0 - 34.0 pg   MCHC 33.5 30.0 - 36.0 g/dL   RDW 85.5 88.4 - 84.4 %   Platelets 243 150 - 400 K/uL   nRBC 0.0 0.0 - 0.2 %    Comment: Performed at [facility], [address], [place], [place] [address]  Troponin T, High Sensitivity     Status: Abnormal   Collection Time: 04/03/24 12:39 PM  Result Value Ref Range   Troponin T High Sensitivity 72 (H) 0 - 19 ng/L    Comment: (NOTE) Biotin concentrations > 1000 ng/mL falsely decrease TnT results.  Serial cardiac troponin measurements are suggested.  Refer to the Links section for chest pain algorithms and additional  guidance. Performed at [facility], [address], [place], [place] [address]   Protime-INR (order if Patient is  taking Coumadin / Warfarin)     Status: Abnormal   Collection Time: 04/03/24 12:39 PM  Result Value Ref Range   Prothrombin Time 24.2 (H) 11.4 - 15.2 seconds   INR 2.1 (H) 0.8 - 1.2    Comment: (NOTE) INR goal varies based on device and disease states. Performed at [facility], [address] [place], [place] [address]   Resp panel by RT-PCR (RSV, Flu A&B, Covid) Anterior Nasal Swab     Status: None   Collection Time: 04/03/24  1:33 PM   Specimen: Anterior Nasal Swab  Result Value Ref Range   SARS Coronavirus 2 by RT PCR NEGATIVE NEGATIVE   Influenza A by PCR NEGATIVE NEGATIVE   Influenza B by PCR NEGATIVE NEGATIVE    Comment: (NOTE) The Xpert Xpress SARS-CoV-2/FLU/RSV plus assay is intended as an aid in the diagnosis of influenza from Nasopharyngeal swab specimens and should not be used as a sole basis for treatment. Nasal washings and aspirates are unacceptable for Xpert Xpress SARS-CoV-2/FLU/RSV testing.  Fact Sheet for Patients: bloggercourse.com  Fact Sheet for Healthcare Providers: seriousbroker.it  This test is not yet approved or cleared by the [place]  FDA and has been authorized for detection and/or diagnosis of SARS-CoV-2 by FDA under an Emergency Use Authorization (EUA). This EUA will remain in effect (meaning this test can be used) for the duration of the COVID-19 declaration under Section 564(b)(1) of the Act, 21 U.S.C. section 360bbb-3(b)(1), unless the authorization is terminated or revoked.     Resp Syncytial Virus by PCR NEGATIVE NEGATIVE    Comment: (NOTE) Fact Sheet for Patients: bloggercourse.com  Fact Sheet for Healthcare Providers: seriousbroker.it  This test is not yet approved or cleared by the [place]  FDA and has been authorized for detection and/or diagnosis of SARS-CoV-2 by FDA under an Emergency Use Authorization (EUA). This  EUA will remain in effect (meaning this test can be used) for the duration of the COVID-19 declaration under Section 564(b)(1) of the Act, 21 U.S.C. section 360bbb-3(b)(1), unless the authorization is terminated or revoked.  Performed at [facility], [address] [place], [place] [address]   Hepatic function panel     Status: Abnormal   Collection Time: 04/03/24  2:14 PM  Result Value Ref Range   Total Protein 6.1 (L) 6.5 - 8.1 g/dL   Albumin 3.1 (L) 3.5 - 5.0 g/dL   AST 28 15 -  41 U/L   ALT 14 0 - 44 U/L   Alkaline Phosphatase 85 38 - 126 U/L   Total Bilirubin 0.6 0.0 - 1.2 mg/dL   Bilirubin, Direct 0.3 (H) 0.0 - 0.2 mg/dL   Indirect Bilirubin 0.3 0.3 - 0.9 mg/dL    Comment: Performed at [facility], [address] [place], [place] [address]  Troponin T, High Sensitivity     Status: Abnormal   Collection Time: 04/03/24  2:14 PM  Result Value Ref Range   Troponin T High Sensitivity 67 (H) 0 - 19 ng/L    Comment: (NOTE) Biotin concentrations > 1000 ng/mL falsely decrease TnT results.  Serial cardiac troponin measurements are suggested.  Refer to the Links section for chest pain algorithms and additional  guidance. Performed at [facility], [address], [place], [place] [address]   Pro Brain natriuretic peptide     Status: Abnormal   Collection Time: 04/03/24  2:14 PM  Result Value Ref Range   Pro Brain Natriuretic Peptide 1,962.0 (H) <300.0 pg/mL    Comment: (NOTE) Age Group        Cut-Points    Interpretation  < 50 years     450 pg/mL       NT-proBNP > 450 pg/mL indicates                                ADHF is likely              50 to 75 years  900 pg/mL      NT-proBNP > 900 pg/mL indicates          ADHF is likely  > 75 years      1800 pg/mL     NT-proBNP > 1800 pg/mL indicates          ADHF is likely                           All ages    Results between       Indeterminate. Further clinical             300 and the cut-   information is  needed to determine            point for age group   if ADHF is present.                                                             Elecsys proBNP II/ Elecsys proBNP II STAT           Cut-Point                       Interpretation  300 pg/mL                    NT-proBNP <300pg/mL indicates                             ADHF is not likely  Performed at [facility], [address] [place], [place] [address]   Respiratory (~20 pathogens) panel by PCR  Status: None   Collection Time: 04/03/24  4:26 PM   Specimen: Nasopharyngeal Swab; Respiratory  Result Value Ref Range   Adenovirus NOT DETECTED NOT DETECTED   Coronavirus 229E NOT DETECTED NOT DETECTED    Comment: (NOTE) The Coronavirus on the Respiratory Panel, DOES NOT test for the novel  Coronavirus (2019 nCoV)    Coronavirus HKU1 NOT DETECTED NOT DETECTED   Coronavirus NL63 NOT DETECTED NOT DETECTED   Coronavirus OC43 NOT DETECTED NOT DETECTED   Metapneumovirus NOT DETECTED NOT DETECTED   Rhinovirus / Enterovirus NOT DETECTED NOT DETECTED   Influenza A NOT DETECTED NOT DETECTED   Influenza B NOT DETECTED NOT DETECTED   Parainfluenza Virus 1 NOT DETECTED NOT DETECTED   Parainfluenza Virus 2 NOT DETECTED NOT DETECTED   Parainfluenza Virus 3 NOT DETECTED NOT DETECTED   Parainfluenza Virus 4 NOT DETECTED NOT DETECTED   Respiratory Syncytial Virus NOT DETECTED NOT DETECTED   Bordetella pertussis NOT DETECTED NOT DETECTED   Bordetella Parapertussis NOT DETECTED NOT DETECTED   Chlamydophila pneumoniae NOT DETECTED NOT DETECTED   Mycoplasma pneumoniae NOT DETECTED NOT DETECTED    Comment: Performed at [facility], [address], [place], [place] [address]  Sodium     Status: Abnormal   Collection Time: 04/03/24  6:05 PM  Result Value Ref Range   Sodium 126 (L) 135 - 145 mmol/L    Comment: Performed at [facility], [address], [place], [place] [address]  Procalcitonin     Status: None   Collection  Time: 04/03/24  6:05 PM  Result Value Ref Range   Procalcitonin 0.69 ng/mL    Comment: (NOTE)   Sepsis PCT Algorithm          Lower Respiratory Tract Infection                                         PCT Algorithm -----------------------------------------------------------------  <0.5 ng/mL                    <0.10 ng/mL  Associated with low           Antibiotic therapy strongly   risk for progression          discouraged. Indicates absence   to severe sepsis              of bacteria infection  and/or septic shock             --------------------------------------------------------------  0.5-2.0 ng/mL                 0.10-0.25 ng/mL  Recommended to retest         Antibiotic therapy discouraged.  PCT within 6-24 hours         Bacterial infection unlikely  ------------------------------------------------------------  >2 ng/mL                      0.26-0.50 ng/mL  Associated with high risk     Antibiotic therapy encouraged.  for progression to severe     Bacterial infection possible  sepsis/and or septic shock    ------------------------------                                 >0.50 ng/mL  Antibiotic therapy strongly                                 encouraged.                                Suggestive of presence of                                 bacterial infection.                                 -------------------------------------------------------------------  < or = 0.50 ng/mL OR          < or = 0.25 OR 80% decrease in PCT  80% decrease in PCT           Antibiotic therapy   Antibiotic therapy may        may be discontinued  be discontinued                                 Performed at [facility], [address] [place], [place] [address]   Urinalysis, Routine w reflex microscopic -Urine, Clean Catch     Status: Abnormal   Collection Time: 04/03/24  7:31 PM  Result Value Ref Range   Color, Urine YELLOW YELLOW   APPearance CLEAR CLEAR    Specific Gravity, Urine 1.020 1.005 - 1.030   pH 5.5 5.0 - 8.0   Glucose, UA NEGATIVE NEGATIVE mg/dL   Hgb urine dipstick NEGATIVE NEGATIVE   Bilirubin Urine SMALL (A) NEGATIVE   Ketones, ur NEGATIVE NEGATIVE mg/dL   Protein, ur NEGATIVE NEGATIVE mg/dL   Nitrite NEGATIVE NEGATIVE   Leukocytes,Ua NEGATIVE NEGATIVE    Comment: Microscopic not done on urines with negative protein, blood, leukocytes, nitrite, or glucose < 500 mg/dL. Performed at [facility], [address] [place], [place] [address]   CBC     Status: Abnormal   Collection Time: 04/04/24  2:26 AM  Result Value Ref Range   WBC 6.7 4.0 - 10.5 K/uL   RBC 3.33 (L) 3.87 - 5.11 MIL/uL   Hemoglobin 10.3 (L) 12.0 - 15.0 g/dL   HCT 70.6 (L) 63.9 - 53.9 %   MCV 88.0 80.0 - 100.0 fL   MCH 30.9 26.0 - 34.0 pg   MCHC 35.2 30.0 - 36.0 g/dL   RDW 85.4 88.4 - 84.4 %   Platelets 237 150 - 400 K/uL   nRBC 0.0 0.0 - 0.2 %    Comment: Performed at [facility], [address], [place], [place] [address]  Basic metabolic panel     Status: Abnormal   Collection Time: 04/04/24  2:26 AM  Result Value Ref Range   Sodium 131 (L) 135 - 145 mmol/L   Potassium 3.6 3.5 - 5.1 mmol/L   Chloride 96 (L) 98 - 111 mmol/L   CO2 25 22 - 32 mmol/L   Glucose, Bld 103 (H) 70 - 99 mg/dL    Comment: Glucose reference range applies only to samples taken after fasting for at least 8 hours.   BUN 36 (H) 8 - 23 mg/dL   Creatinine, Ser 8.90 (  H) 0.44 - 1.00 mg/dL   Calcium  9.0 8.9 - 10.3 mg/dL   GFR, Estimated 56 (L) >60 mL/min    Comment: (NOTE) Calculated using the CKD-EPI Creatinine Equation (2021)    Anion gap 10 5 - 15    Comment: Performed at [facility], [address], [place], [place] [address]    ECG   N/A  Telemetry   Paced rhythm - Personally Reviewed  Radiology    CT Head Wo Contrast Result Date: 04/03/2024 EXAM: CT HEAD WITHOUT CONTRAST 04/03/2024 02:18:17 PM TECHNIQUE: CT of the head was performed without  the administration of intravenous contrast. Automated exposure control, iterative reconstruction, and/or weight based adjustment of the mA/kV was utilized to reduce the radiation dose to as low as reasonably achievable. COMPARISON: None available. CLINICAL HISTORY: Headache, new onset (Age >= 51y). FINDINGS: BRAIN AND VENTRICLES: No acute hemorrhage. No evidence of acute infarct. No hydrocephalus. No extra-axial collection. No mass effect or midline shift. Atherosclerotic calcifications are present in the cavernous carotid arteries bilaterally and at the dural margin of both vertebral arteries. No hyperdense vessel is present. ORBITS: No acute abnormality. SINUSES: No acute abnormality. SOFT TISSUES AND SKULL: No acute soft tissue abnormality. No skull fracture. IMPRESSION: 1. No acute intracranial abnormality. Electronically signed by: Lonni Necessary MD 04/03/2024 02:21 PM EST RP Workstation: [id number]   DG Chest 2 View Result Date: 04/03/2024 CLINICAL DATA:  Chest pain. EXAM: CHEST - 2 VIEW COMPARISON:  Radiograph 03/17/2024. 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 Cardiomegaly is stable. Stable left pleural effusion and basilar opacity. There is a new small right pleural effusion. Vascular congestion. No pneumothorax. No acute osseous findings. IMPRESSION: 1. Stable cardiomegaly. 2. Stable left pleural effusion and basilar opacity. 3. New small right pleural effusion.  Vascular congestion. Electronically Signed   By: Andrea Gasman M.D.   On: 04/03/2024 13:30    Cardiac Studies   N/A  Assessment   Principal Problem:   Acute on chronic combined systolic and diastolic CHF (congestive heart failure) (HCC) Active Problems:   Hypothyroidism   Persistent atrial fibrillation (HCC)   AKI (acute kidney injury)   Hyponatremia   Malaise   Leukocytosis   Elevated troponin   Hypotension   Pleural effusion due to CHF (congestive heart failure)  (HCC)   Anxiety   Plan   Appears this may be acute on chronic systolic heart failure with cardiorenal syndrome. Hypotension may have been making her feel bad as well. BP improved today, but Entresto  held. She was on a very small dose. She was given 750 cc LR and lasix . Doubt amiodarone  was causative of side effects, restart 200 mg daily. Restart aldactone  12.5 mg daily - hold on lasix  for now with restarting low dose Entresto . Please do not give additional fluids.  Monitor today - if symptoms improved and bp stable, may be ok for d/c tomorrow   Time Spent Directly with Patient:  I have spent a total of 35 minutes with the patient reviewing hospital notes, telemetry, EKGs, labs and examining the patient as well as establishing an assessment and plan that was discussed personally with the patient.  > 50% of time was spent in direct patient care.  Length of Stay:  LOS: 1 day   [name] KYM Maxcy, MD, [facility], FNLA, FACP  [place]  [facility]  Medical Director of the Advanced Lipid Disorders &  Cardiovascular Risk Reduction Clinic Diplomate of the [employer] of  Clinical Lipidology Attending Cardiologist  Direct Dial: [phone]  Fax: [phone]  Website:  [email or web address] [name] [name] 04/04/2024, 8:38 AM   "

## 2024-04-05 ENCOUNTER — Other Ambulatory Visit (HOSPITAL_COMMUNITY): Payer: Self-pay

## 2024-04-05 DIAGNOSIS — I5043 Acute on chronic combined systolic (congestive) and diastolic (congestive) heart failure: Secondary | ICD-10-CM | POA: Diagnosis not present

## 2024-04-05 LAB — CBC
HCT: 34 % — ABNORMAL LOW (ref 36.0–46.0)
Hemoglobin: 11.4 g/dL — ABNORMAL LOW (ref 12.0–15.0)
MCH: 30.1 pg (ref 26.0–34.0)
MCHC: 33.5 g/dL (ref 30.0–36.0)
MCV: 89.7 fL (ref 80.0–100.0)
Platelets: 244 K/uL (ref 150–400)
RBC: 3.79 MIL/uL — ABNORMAL LOW (ref 3.87–5.11)
RDW: 14.3 % (ref 11.5–15.5)
WBC: 4.4 K/uL (ref 4.0–10.5)
nRBC: 0 % (ref 0.0–0.2)

## 2024-04-05 LAB — BASIC METABOLIC PANEL WITH GFR
Anion gap: 10 (ref 5–15)
BUN: 19 mg/dL (ref 8–23)
CO2: 24 mmol/L (ref 22–32)
Calcium: 9.3 mg/dL (ref 8.9–10.3)
Chloride: 101 mmol/L (ref 98–111)
Creatinine, Ser: 0.79 mg/dL (ref 0.44–1.00)
GFR, Estimated: >60 mL/min
Glucose, Bld: 156 mg/dL — ABNORMAL HIGH (ref 70–99)
Potassium: 3.8 mmol/L (ref 3.5–5.1)
Sodium: 135 mmol/L (ref 135–145)

## 2024-04-05 MED ORDER — FUROSEMIDE 40 MG PO TABS
40.0000 mg | ORAL_TABLET | Freq: Every day | ORAL | Status: DC
  Administered 2024-04-05: 40 mg via ORAL
  Filled 2024-04-05: qty 1

## 2024-04-05 MED ORDER — CARVEDILOL 3.125 MG PO TABS: 3.1250 mg | ORAL_TABLET | Freq: Two times a day (BID) | ORAL | 0 refills | Status: DC

## 2024-04-05 MED ORDER — AMIODARONE HCL 200 MG PO TABS: 200.0000 mg | ORAL_TABLET | Freq: Every day | ORAL | 0 refills | Status: DC

## 2024-04-05 NOTE — Discharge Summary (Signed)
 " Physician Discharge Summary   Patient: [name] [name] MRN: [id number] DOB: 09-02-1955  Admit date:     04/03/2024  Discharge date: 04/05/2024  Discharge Physician: Casimer Dare   PCP: Sherial Bail, MD   Recommendations at discharge:    Follow up with cardiology, has an appointment set up  Coreg  dose was decreased Entresto  was held per cardiology, will discuss about resuming at the next clinic visit   Discharge Diagnoses: Principal Problem:   Acute on chronic combined systolic and diastolic CHF (congestive heart failure) (HCC) Active Problems:   Pleural effusion due to CHF (congestive heart failure) (HCC)   Malaise   Leukocytosis   Elevated troponin   Hypotension   Persistent atrial fibrillation (HCC)   Hyponatremia   AKI (acute kidney injury)   Hypothyroidism   Anxiety  Hospital Course:  68 y.o. female with medical history significant of Hypertension, hyperlipidemia, systolic CHF (20-25%), atrial fibrillation, complete heart block s/p PPM, hypothyroidism, peripheral artery disease s/p iliac stent, and GERD who presents with malaise. CXR showed cardiomegaly, elevated pro-BNP and AKI, suggestive of mild hypervolemia and potential cardiorenal syndrome. She was seen by cardiology as well   Assessment and Plan:  Combined systolic and diastolic CHF Pleural effusions Acute on chronic.  Patient presents with complaints of malaise.  Chest x-ray noting table cardiomegaly with stable left pleural effusion with possible and new small right pleural effusion with vascular congestion. last echocardiogram noted EF to be 20 to 25% with global hypokinesis, severe LVH, grade 3 diastolic dysfunction with apical akinesis concerning for early thrombus formation. Pro-BNP was also elevated  - appreciate cardiology recommendations - entresto  was held on discharge per cardiology - lasix  resumed, and coreg  dose was decreased - follow up with cardiology as outpatient    [facility] Patient attributes  feelings of being unwell, headache, myalgias to being started on amiodarone , which seems less likely per cardiology - hyponatremia likely contributing - in better spirits on the day of discharge    Leukocytosis - resolved    Elevated troponin Patient did report intermittently having chest pains.  High-sensitivity troponins are mildly elevated at 72->67. - seen by cardiology   Hypotension Resolved now, BP improved to the 120s/70s - entresto  held on discharge - coreg  resumed at a lower dose    Persistent atrial fibrillation/flutter Patient was transition to amiodarone  on 12/6.  Patient reports having significant complications related to amiodarone . - Continue Eliquis  - amiodarone  resumed per cardiology    Hyponatremia Likely hypervolemic hyponatremia  - improved with diuresis, continue to monitor    Acute kidney injury Creatinine noted to be 1.55 with BUN 41.  Thought possibly secondary to hypoperfusion/cardiorenal - Renal function back to baseline with Cr 1.09    Hypothyroidism TSH noted to be 3.121 when checked on 12/3. - Continue levothyroxine     Anxiety - Continue clonazepam  as needed for anxiety.   Hyperlipidemia - Continue lovastatin , patient is not on pravastatin          Consultants: Cardiology Procedures performed: None  Disposition: Home Diet recommendation:  Discharge Diet Orders (From admission, onward)     Start     Ordered   04/05/24 0000  Diet - low sodium heart healthy        04/05/24 1055           Cardiac diet DISCHARGE MEDICATION: Allergies as of 04/05/2024       Reactions   Marshmallow [althaea Officinalis]    Rash, hives, itching, SOB Pt states this is no  longer an issue for her   Other    Jello - Rash, hives, itching, SOB Pt states is no longer an issue for her   Latex Rash   Nickel Rash        Medication List     PAUSE taking these medications    sacubitril -valsartan  24-26 MG Wait to take this until your doctor or  other care provider tells you to start again. Commonly known as: Entresto  Take (1/2) tablet twice daily       TAKE these medications    acetaminophen  325 MG tablet Commonly known as: Tylenol  Take 2 tablets (650 mg total) by mouth every 6 (six) hours as needed for mild pain (pain score 1-3).   amiodarone  200 MG tablet Commonly known as: PACERONE  Take 1 tablet (200 mg total) by mouth daily. What changed:  how much to take how to take this when to take this additional instructions   apixaban  5 MG Tabs tablet Commonly known as: ELIQUIS  Take 1 tablet (5 mg total) by mouth 2 (two) times daily.   carvedilol  3.125 MG tablet Commonly known as: COREG  Take 1 tablet (3.125 mg total) by mouth 2 (two) times daily. What changed:  medication strength how much to take   clonazePAM  0.5 MG tablet Commonly known as: KLONOPIN  TAKE 1 TABLET BY MOUTH ONCE DAILY AS NEEDED FOR ANXIETY/ SLEEP   cyanocobalamin 1000 MCG tablet Commonly known as: VITAMIN B12 Take 1,000 mcg by mouth at bedtime.   furosemide  40 MG tablet Commonly known as: LASIX  Take 1 tablet (40 mg total) by mouth daily.   levothyroxine  112 MCG tablet Commonly known as: SYNTHROID  Take 1 tablet by mouth once daily   lovastatin  20 MG tablet Commonly known as: MEVACOR  TAKE 1 TABLET BY MOUTH AT BEDTIME   oxyCODONE  5 MG immediate release tablet Commonly known as: Oxy IR/ROXICODONE  Take 1 tablet (5 mg total) by mouth every 6 (six) hours as needed for severe pain (pain score 7-10).   potassium chloride  10 MEQ tablet Commonly known as: KLOR-CON  M Take 1 tablet (10 mEq total) by mouth daily. What changed:  when to take this reasons to take this   spironolactone  25 MG tablet Commonly known as: ALDACTONE  Take 12.5 mg by mouth daily.   Vitamin D3 25 MCG (1000 UT) Caps Take 1,000 Units by mouth at bedtime.        Discharge Exam: Filed Weights   04/03/24 2027 04/04/24 0707 04/05/24 0500  Weight: 81.1 kg 80.5 kg 81  kg   Physical Exam Vitals and nursing note reviewed.  Constitutional:      General: She is not in acute distress. Cardiovascular:     Rate and Rhythm: Normal rate.  Pulmonary:     Effort: No respiratory distress.  Abdominal:     General: There is no distension.  Musculoskeletal:     Right lower leg: No edema.     Left lower leg: No edema.      Condition at discharge: good  The results of significant diagnostics from this hospitalization (including imaging, microbiology, ancillary and laboratory) are listed below for reference.   Imaging Studies: CT Head Wo Contrast Result Date: 04/03/2024 EXAM: CT HEAD WITHOUT CONTRAST 04/03/2024 02:18:17 PM TECHNIQUE: CT of the head was performed without the administration of intravenous contrast. Automated exposure control, iterative reconstruction, and/or weight based adjustment of the mA/kV was utilized to reduce the radiation dose to as low as reasonably achievable. COMPARISON: None available. CLINICAL HISTORY: Headache, new onset (Age >=  51y). FINDINGS: BRAIN AND VENTRICLES: No acute hemorrhage. No evidence of acute infarct. No hydrocephalus. No extra-axial collection. No mass effect or midline shift. Atherosclerotic calcifications are present in the cavernous carotid arteries bilaterally and at the dural margin of both vertebral arteries. No hyperdense vessel is present. ORBITS: No acute abnormality. SINUSES: No acute abnormality. SOFT TISSUES AND SKULL: No acute soft tissue abnormality. No skull fracture. IMPRESSION: 1. No acute intracranial abnormality. Electronically signed by: Lonni Necessary MD 04/03/2024 02:21 PM EST RP Workstation: [id number]   DG Chest 2 View Result Date: 04/03/2024 CLINICAL DATA:  Chest pain. EXAM: CHEST - 2 VIEW COMPARISON:  Radiograph 03/17/2024. 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 Cardiomegaly is stable. Stable left pleural effusion and basilar opacity. There  is a new small right pleural effusion. Vascular congestion. No pneumothorax. No acute osseous findings. IMPRESSION: 1. Stable cardiomegaly. 2. Stable left pleural effusion and basilar opacity. 3. New small right pleural effusion.  Vascular congestion. Electronically Signed   By: Andrea Gasman M.D.   On: 04/03/2024 13:30   MM 3D SCREENING MAMMOGRAM BILATERAL BREAST Result Date: 04/01/2024 CLINICAL DATA:  Screening. EXAM: DIGITAL SCREENING BILATERAL MAMMOGRAM WITH TOMOSYNTHESIS AND CAD TECHNIQUE: Bilateral screening digital craniocaudal and mediolateral oblique mammograms were obtained. Bilateral screening digital breast tomosynthesis was performed. The images were evaluated with computer-aided detection. COMPARISON:  Previous exam(s). ACR Breast Density Category c: The breasts are heterogeneously dense, which may obscure small masses. FINDINGS: There are no findings suspicious for malignancy. IMPRESSION: No mammographic evidence of malignancy. A result letter of this screening mammogram will be mailed directly to the patient. RECOMMENDATION: Screening mammogram in one year. (Code:[id number]-01Y) BI-RADS CATEGORY  1: Negative. Electronically Signed   By: Alm Parkins M.D.   On: 04/01/2024 13:13   CT ABDOMEN PELVIS W WO CONTRAST Result Date: 03/20/2024 CLINICAL DATA:  Hematuria EXAM: CT ABDOMEN AND PELVIS WITHOUT AND WITH CONTRAST TECHNIQUE: Multidetector CT imaging of the abdomen and pelvis was performed following the standard protocol before and following the bolus administration of intravenous contrast. RADIATION DOSE REDUCTION: This exam was performed according to the departmental dose-optimization program which includes automated exposure control, adjustment of the mA and/or kV according to patient size and/or use of iterative reconstruction technique. CONTRAST:  OMNIPAQUE  IOHEXOL  350 MG/ML SOLN COMPARISON:  11/16/2013 FINDINGS: Lower chest: There is a small partially loculated left pleural effusion  with underlying left lower lobe consolidation likely representing atelectasis. Right lung bases clear. Trace pericardial fluid. Multi lead cardiac pacer and cardiomegaly are noted. Hepatobiliary: No focal liver abnormality is seen. No gallstones, gallbladder wall thickening, or biliary dilatation. Pancreas: Unremarkable. No pancreatic ductal dilatation or surrounding inflammatory changes. Spleen: Normal in size without focal abnormality. Adrenals/Urinary Tract: No urinary tract calculi or obstructive uropathy within either kidney. Following contrast administration, multiple simple right renal cortical cysts are identified which do not require specific imaging follow-up. There is no abnormal renal parenchymal enhancement. Minimal bilateral renal cortical scarring is noted, right greater than left. Delayed imaging demonstrates normal excretion of contrast into unremarkable bilateral pelvocaliceal structures. There are no filling defects or mucosal irregularities. The adrenals are unremarkable. Bladder is minimally distended, with no filling defects or mucosal irregularities. Stomach/Bowel: No bowel obstruction or ileus. The appendix, if still present, is not well visualized. Scattered sigmoid diverticulosis without diverticulitis. There is short segment wall thickening within the mid transverse colon, reference image 39/8, which is evident to varying degrees on all 3 phases of the exam. While  this could reflect peristalsis, correlation with colonoscopy is recommended if not recently performed to exclude underlying colonic mass. Vascular/Lymphatic: Aortic atherosclerosis. No enlarged abdominal or pelvic lymph nodes. Reproductive: Status post hysterectomy. No adnexal masses. Other: No free fluid or free intraperitoneal gas. Postsurgical changes from prior ventral hernia repair. Musculoskeletal: Right hip arthroplasty. No acute or destructive bony abnormalities. Reconstructed images demonstrate no additional findings.  IMPRESSION: 1. No etiology identified to explain the patient's reported hematuria. If hematuria remains unexplained, cystoscopy may be useful. 2. Indeterminate area of short segment mural thickening within the mid transverse colon, which may be due to peristalsis. Correlation with colonoscopy is recommended if not recently performed. 3. Small partially loculated left pleural effusion with underlying left lower lobe atelectasis. 4. Sigmoid diverticulosis without diverticulitis. 5.  Aortic Atherosclerosis (ICD10-I70.0). Electronically Signed   By: Ozell Daring M.D.   On: 03/20/2024 13:42   CARDIAC CATHETERIZATION Result Date: 03/18/2024 Coronary angiography 03/18/2024: LM: Normal LAD: Normal, no significant disease Lcx: Normal, no significant disease RCA: Dominant. Normal, no significant disease LVEDP 23 mmHg Right heart catheterization 03/18/2024: RA: 16 mmHg RV: 45/6 mmHg PA: 43/26 mmHg, mPAP 33 mmHg PCW: could not ob obtained PAPi 1.0 AO sats: 93% PA sats: 64% CO: 5.2 L/min CI: 2.8 L/min/m2 Conclusion: Non coronary artery disease Elevated filling pressures, including RA Severe biventricular failure Decompensated nonischemic cardiomyopathy Moderate pulmonary hypertension. WHOP Grp II Recommendation: Continue diuresis and GDNT for HFrEF Okay to resume Eliquis  03/18/2024 morning, unless any other procedures planned [name], MD   DG CHEST PORT 1 VIEW Result Date: 03/17/2024 CLINICAL DATA:  Shortness of breath. EXAM: PORTABLE CHEST 1 VIEW COMPARISON:  03/16/2024 FINDINGS: Stable enlarged cardiac silhouette, moderate-sized left pleural effusion and left basilar atelectasis. Clear right lung. Stable mild diffuse peribronchial thickening. Stable left subclavian pacer and AICD leads. Tortuous and partially calcified thoracic aorta. Unremarkable bones. IMPRESSION: 1. Stable moderate-sized left pleural effusion and left basilar atelectasis. 2. Stable cardiomegaly. 3. Stable mild chronic bronchitic changes.  Electronically Signed   By: Elspeth Bathe M.D.   On: 03/17/2024 13:42   DG CHEST PORT 1 VIEW Result Date: 03/16/2024 EXAM: 1 VIEW(S) XRAY OF THE CHEST 03/16/2024 12:24:00 PM COMPARISON: 03/15/2024. CLINICAL HISTORY: Pleural effusion. FINDINGS: LINES, TUBES AND DEVICES: Left AICD remains in place, unchanged. LUNGS AND PLEURA: Small to moderate left pleural effusion with left lower lobe atelectasis or infiltrate, similar to prior study. No pneumothorax. HEART AND MEDIASTINUM: Cardiomegaly. BONES AND SOFT TISSUES: No acute osseous abnormality. IMPRESSION: 1. Small to moderate left pleural effusion with left lower lobe atelectasis or infiltrate, similar to prior study. Electronically signed by: Franky Crease MD 03/16/2024 04:46 PM EST RP Workstation: [id number]   ECHOCARDIOGRAM COMPLETE Result Date: 03/16/2024    ECHOCARDIOGRAM REPORT   Patient Name:   TIWATOPE EMMITT Date of Exam: 03/16/2024 Medical Rec #:  [id number]     Height:       64.0 in Accession #:    [id number]    Weight:       181.7 lb Date of Birth:  Sep 17, 1955     BSA:          1.878 m Patient Age:    68 years      BP:           104/62 mmHg Patient Gender: F             HR:           70 bpm. Exam Location:  Inpatient Procedure: 2D  Echo, Intracardiac Opacification Agent, Cardiac Doppler and Color            Doppler (Both Spectral and Color Flow Doppler were utilized during            procedure). Indications:    Congestive Heart Failure I50.9  History:        Patient has prior history of Echocardiogram examinations, most                 recent 10/16/2020. CHF and Cardiomyopathy, Pacemaker, PAD; Risk                 Factors:Hypertension, Dyslipidemia and Former Smoker.  Sonographer:    Koleen Popper RDCS Referring Phys: [id number] TAYLOR A PARCELLS  Sonographer Comments: Suboptimal apical window. IMPRESSIONS  1. Left ventricular ejection fraction, by estimation, is 20 to 25%. The left ventricle has severely decreased function. The left ventricle demonstrates global  hypokinesis. The left ventricular internal cavity size was severely dilated. There is severe eccentric left ventricular hypertrophy. Left ventricular diastolic parameters are consistent with Grade III diastolic dysfunction (restrictive). There is severe global hypokinesis with apical akinesis and swerlling of contrast concerning for early thrombus formation.  2. Right ventricular systolic function is mildly reduced. The right ventricular size is normal. There is normal pulmonary artery systolic pressure.  3. Left atrial size was mildly dilated.  4. Lead in RA.  5. The mitral valve is normal in structure. No evidence of mitral valve regurgitation.  6. At least moderate TR possibly in setting of lead impingement. Tricuspid valve regurgitation is moderate.  7. The aortic valve is normal in structure. Aortic valve regurgitation is not visualized.  8. The inferior vena cava is dilated in size with <50% respiratory variability, suggesting right atrial pressure of 15 mmHg. Comparison(s): Prior images reviewed side by side. EF is slightly worse and there is apical akinesis with possible early thrombus formation. No pericardial effusion. FINDINGS  Left Ventricle: Left ventricular ejection fraction, by estimation, is 20 to 25%. The left ventricle has severely decreased function. The left ventricle demonstrates global hypokinesis. Definity  contrast agent was given IV to delineate the left ventricular endocardial borders. The left ventricular internal cavity size was severely dilated. There is severe eccentric left ventricular hypertrophy. Left ventricular diastolic parameters are consistent with Grade III diastolic dysfunction (restrictive).  LV Wall Scoring: The apex is akinetic. The entire anterior wall, entire lateral wall, entire septum, and entire inferior wall are hypokinetic. There is severe global hypokinesis with apical akinesis and swerlling of contrast concerning for early thrombus formation. Right Ventricle: The  right ventricular size is normal. No increase in right ventricular wall thickness. Right ventricular systolic function is mildly reduced. There is normal pulmonary artery systolic pressure. The tricuspid regurgitant velocity is 2.14 m/s, and with an assumed right atrial pressure of 8 mmHg, the estimated right ventricular systolic pressure is 26.3 mmHg. Left Atrium: Left atrial size was mildly dilated. Right Atrium: Lead in RA. Right atrial size was normal in size. Pericardium: There is no evidence of pericardial effusion. Mitral Valve: The mitral valve is normal in structure. No evidence of mitral valve regurgitation. Tricuspid Valve: At least moderate TR possibly in setting of lead impingement. The tricuspid valve is normal in structure. Tricuspid valve regurgitation is moderate. Aortic Valve: The aortic valve is normal in structure. Aortic valve regurgitation is not visualized. Pulmonic Valve: The pulmonic valve was not well visualized. Pulmonic valve regurgitation is not visualized. Aorta: The aortic root is normal in size and  structure. Venous: The inferior vena cava is dilated in size with less than 50% respiratory variability, suggesting right atrial pressure of 15 mmHg. Additional Comments: A device lead is visualized.  LEFT VENTRICLE PLAX 2D LVIDd:         5.60 cm      Diastology LVIDs:         5.00 cm      LV e' medial:    8.66 cm/s LV PW:         1.20 cm      LV E/e' medial:  10.5 LV IVS:        1.00 cm      LV e' lateral:   4.05 cm/s LVOT diam:     1.70 cm      LV E/e' lateral: 22.4 LV SV:         43 LV SV Index:   23 LVOT Area:     2.27 cm  LV Volumes (MOD) LV vol d, MOD A4C: 223.0 ml LV vol s, MOD A4C: 141.0 ml LV SV MOD A4C:     223.0 ml RIGHT VENTRICLE            IVC RV S prime:     7.01 cm/s  IVC diam: 2.30 cm TAPSE (M-mode): 1.3 cm LEFT ATRIUM           Index LA diam:      4.30 cm 2.29 cm/m LA Vol (A4C): 65.1 ml 34.66 ml/m  AORTIC VALVE LVOT Vmax:   132.00 cm/s LVOT Vmean:  77.600 cm/s LVOT VTI:     0.188 m  AORTA Ao Root diam: 3.10 cm Ao Asc diam:  3.40 cm MITRAL VALVE               TRICUSPID VALVE MV Area (PHT): 4.49 cm    TR Peak grad:   18.3 mmHg MV Decel Time: 169 msec    TR Vmax:        214.00 cm/s MV E velocity: 90.70 cm/s MV A velocity: 22.00 cm/s  SHUNTS MV E/A ratio:  4.12        Systemic VTI:  0.19 m                            Systemic Diam: 1.70 cm Joelle Azobou Tonleu Electronically signed by Joelle Cedars Tonleu Signature Date/Time: 03/16/2024/12:33:08 PM    Final    DG Chest Port 1 View Result Date: 03/15/2024 EXAM: 1 VIEW(S) XRAY OF THE CHEST 03/15/2024 07:10:00 PM COMPARISON: 03/15/2024 CLINICAL HISTORY: S/P thoracentesis FINDINGS: LINES, TUBES AND DEVICES: Left chest Automatic Implantable Cardioverter Defibrillator (AICD) with leads stable in position. LUNGS AND PLEURA: Decreased small to moderate left pleural effusion. Associated left basilar retrocardiac opacification. No pneumothorax. HEART AND MEDIASTINUM: Cardiomegaly. Atherosclerotic calcifications of the aorta. BONES AND SOFT TISSUES: No acute osseous abnormality. IMPRESSION: 1. Decreased small to moderate left pleural effusion with associated left basilar retrocardiac opacification. 2. Cardiomegaly. Electronically signed by: Dorethia Molt MD 03/15/2024 07:41 PM EST RP Workstation: [id number]   DG Chest 2 View Result Date: 03/15/2024 CLINICAL DATA:  Shortness of breath 2 days. EXAM: CHEST - 2 VIEW COMPARISON:  03/01/2024 FINDINGS: Left-sided pacemaker unchanged. Lungs are adequately inflated demonstrate opacification over the left base/retrocardiac region with slight interval worsening compatible with moderate size effusion likely with associated basilar atelectasis. Right lung is clear. Stable cardiomegaly. Remainder of the exam is unchanged. IMPRESSION: 1. Slight interval worsening opacification over the  left base/retrocardiac region compatible with moderate size effusion likely with associated basilar atelectasis. 2. Stable  cardiomegaly. Electronically Signed   By: Toribio Agreste M.D.   On: 03/15/2024 09:06   EP STUDY Result Date: 03/07/2024 See surgical note for result.   Microbiology: Results for orders placed or performed during the hospital encounter of 04/03/24  Resp panel by RT-PCR (RSV, Flu A&B, Covid) Anterior Nasal Swab     Status: None   Collection Time: 04/03/24  1:33 PM   Specimen: Anterior Nasal Swab  Result Value Ref Range Status   SARS Coronavirus 2 by RT PCR NEGATIVE NEGATIVE Final   Influenza A by PCR NEGATIVE NEGATIVE Final   Influenza B by PCR NEGATIVE NEGATIVE Final    Comment: (NOTE) The Xpert Xpress SARS-CoV-2/FLU/RSV plus assay is intended as an aid in the diagnosis of influenza from Nasopharyngeal swab specimens and should not be used as a sole basis for treatment. Nasal washings and aspirates are unacceptable for Xpert Xpress SARS-CoV-2/FLU/RSV testing.  Fact Sheet for Patients: bloggercourse.com  Fact Sheet for Healthcare Providers: seriousbroker.it  This test is not yet approved or cleared by the [place]  FDA and has been authorized for detection and/or diagnosis of SARS-CoV-2 by FDA under an Emergency Use Authorization (EUA). This EUA will remain in effect (meaning this test can be used) for the duration of the COVID-19 declaration under Section 564(b)(1) of the Act, 21 U.S.C. section 360bbb-3(b)(1), unless the authorization is terminated or revoked.     Resp Syncytial Virus by PCR NEGATIVE NEGATIVE Final    Comment: (NOTE) Fact Sheet for Patients: bloggercourse.com  Fact Sheet for Healthcare Providers: seriousbroker.it  This test is not yet approved or cleared by the [place]  FDA and has been authorized for detection and/or diagnosis of SARS-CoV-2 by FDA under an Emergency Use Authorization (EUA). This EUA will remain in effect (meaning this test can be  used) for the duration of the COVID-19 declaration under Section 564(b)(1) of the Act, 21 U.S.C. section 360bbb-3(b)(1), unless the authorization is terminated or revoked.  Performed at [facility], [address] [place], [place] [address]   Respiratory (~20 pathogens) panel by PCR     Status: None   Collection Time: 04/03/24  4:26 PM   Specimen: Nasopharyngeal Swab; Respiratory  Result Value Ref Range Status   Adenovirus NOT DETECTED NOT DETECTED Final   Coronavirus 229E NOT DETECTED NOT DETECTED Final    Comment: (NOTE) The Coronavirus on the Respiratory Panel, DOES NOT test for the novel  Coronavirus (2019 nCoV)    Coronavirus HKU1 NOT DETECTED NOT DETECTED Final   Coronavirus NL63 NOT DETECTED NOT DETECTED Final   Coronavirus OC43 NOT DETECTED NOT DETECTED Final   Metapneumovirus NOT DETECTED NOT DETECTED Final   Rhinovirus / Enterovirus NOT DETECTED NOT DETECTED Final   Influenza A NOT DETECTED NOT DETECTED Final   Influenza B NOT DETECTED NOT DETECTED Final   Parainfluenza Virus 1 NOT DETECTED NOT DETECTED Final   Parainfluenza Virus 2 NOT DETECTED NOT DETECTED Final   Parainfluenza Virus 3 NOT DETECTED NOT DETECTED Final   Parainfluenza Virus 4 NOT DETECTED NOT DETECTED Final   Respiratory Syncytial Virus NOT DETECTED NOT DETECTED Final   Bordetella pertussis NOT DETECTED NOT DETECTED Final   Bordetella Parapertussis NOT DETECTED NOT DETECTED Final   Chlamydophila pneumoniae NOT DETECTED NOT DETECTED Final   Mycoplasma pneumoniae NOT DETECTED NOT DETECTED Final    Comment: Performed at [facility], [address], [place], [place] [address]    Labs: CBC: Recent Labs  Lab 04/03/24 1239 04/04/24 0226 04/05/24 0820  WBC 11.5* 6.7 4.4  HGB 11.5* 10.3* 11.4*  HCT 34.3* 29.3* 34.0*  MCV 89.3 88.0 89.7  PLT 243 237 244   Basic Metabolic Panel: Recent Labs  Lab 03/29/24 1437 04/03/24 1239 04/03/24 1805 04/04/24 0226 04/05/24 0820  NA 138 126*  126* 131* 135  K 3.9 3.8  --  3.6 3.8  CL 100 91*  --  96* 101  CO2 25 24  --  25 24  GLUCOSE 102* 162*  --  103* 156*  BUN 22 41*  --  36* 19  CREATININE 1.18* 1.55*  --  1.09* 0.79  CALCIUM  10.0 9.3  --  9.0 9.3   Liver Function Tests: Recent Labs  Lab 04/03/24 1414  AST 28  ALT 14  ALKPHOS 85  BILITOT 0.6  PROT 6.1*  ALBUMIN 3.1*   CBG: No results for input(s): GLUCAP in the last 168 hours.  Discharge time spent: 35 minutes  Signed: Casimer Dare, MD [facility] 04/05/2024 "

## 2024-04-05 NOTE — Progress Notes (Signed)
 "   DAILY PROGRESS NOTE   Patient Name: [name] [name] Date of Encounter: 04/05/2024 Cardiologist: None  Chief Complaint   No complaints  Patient Profile   [name] [name] is a 68 y.o. female with a hx of NICM (EF 20-25%),  who is being seen 04/03/2024 for the evaluation of subjective fatigue at the request of Dr. Claudene [facility]).   Subjective   Net negative 700 cc overnight- now 1.2L negative. BP improved. Entresto  has been held. Feels better today - looking to go home.  Objective   Vitals:   04/04/24 2018 04/05/24 0005 04/05/24 0435 04/05/24 0500  BP: 111/61 112/65 (!) 107/55   Pulse: 92 70 70   Resp: 20 20 20    Temp: 98.1 F (36.7 C) (!) 97.3 F (36.3 C) 98 F (36.7 C)   TempSrc: Oral Oral Oral   SpO2: 94% 94% 97%   Weight:    81 kg  Height:        Intake/Output Summary (Last 24 hours) at 04/05/2024 0805 Last data filed at 04/05/2024 0540 Gross per 24 hour  Intake --  Output 700 ml  Net -700 ml   Filed Weights   04/03/24 2027 04/04/24 0707 04/05/24 0500  Weight: 81.1 kg 80.5 kg 81 kg    Physical Exam   General appearance: alert and no distress Neck: no carotid bruit, no JVD, and thyroid  not enlarged, symmetric, no tenderness/mass/nodules Lungs: clear to auscultation bilaterally Heart: regular rate and rhythm Abdomen: soft, non-tender; bowel sounds normal; no masses,  no organomegaly Extremities: extremities normal, atraumatic, no cyanosis or edema Pulses: 2+ and symmetric Skin: Skin color, texture, turgor normal. No rashes or lesions Neurologic: Grossly normal Psych: Pleasant  Inpatient Medications    Scheduled Meds:  amiodarone   200 mg Oral Daily   apixaban   5 mg Oral BID   clonazePAM   0.5 mg Oral BID   levothyroxine   112 mcg Oral Q0600   senna  1 tablet Oral Daily   spironolactone   12.5 mg Oral Daily    Continuous Infusions:   PRN Meds: acetaminophen  **OR** acetaminophen , albuterol , oxyCODONE    Labs   Results for orders placed or  performed during the hospital encounter of 04/03/24 (from the past 48 hours)  Basic metabolic panel     Status: Abnormal   Collection Time: 04/03/24 12:39 PM  Result Value Ref Range   Sodium 126 (L) 135 - 145 mmol/L   Potassium 3.8 3.5 - 5.1 mmol/L   Chloride 91 (L) 98 - 111 mmol/L   CO2 24 22 - 32 mmol/L   Glucose, Bld 162 (H) 70 - 99 mg/dL    Comment: Glucose reference range applies only to samples taken after fasting for at least 8 hours.   BUN 41 (H) 8 - 23 mg/dL   Creatinine, Ser 8.44 (H) 0.44 - 1.00 mg/dL   Calcium  9.3 8.9 - 10.3 mg/dL   GFR, Estimated 37 (L) >60 mL/min    Comment: (NOTE) Calculated using the CKD-EPI Creatinine Equation (2021)    Anion gap 11 5 - 15    Comment: Performed at [facility], [address], [place], [place] [address]  CBC     Status: Abnormal   Collection Time: 04/03/24 12:39 PM  Result Value Ref Range   WBC 11.5 (H) 4.0 - 10.5 K/uL   RBC 3.84 (L) 3.87 - 5.11 MIL/uL   Hemoglobin 11.5 (L) 12.0 - 15.0 g/dL   HCT 65.6 (L) 63.9 - 53.9 %  MCV 89.3 80.0 - 100.0 fL   MCH 29.9 26.0 - 34.0 pg   MCHC 33.5 30.0 - 36.0 g/dL   RDW 85.5 88.4 - 84.4 %   Platelets 243 150 - 400 K/uL   nRBC 0.0 0.0 - 0.2 %    Comment: Performed at [facility], [address], [place], [place] [address]  Troponin T, High Sensitivity     Status: Abnormal   Collection Time: 04/03/24 12:39 PM  Result Value Ref Range   Troponin T High Sensitivity 72 (H) 0 - 19 ng/L    Comment: (NOTE) Biotin concentrations > 1000 ng/mL falsely decrease TnT results.  Serial cardiac troponin measurements are suggested.  Refer to the Links section for chest pain algorithms and additional  guidance. Performed at [facility], [address] [place], [place] [address]   Protime-INR (order if Patient is taking Coumadin / Warfarin)     Status: Abnormal   Collection Time: 04/03/24 12:39 PM  Result Value Ref Range   Prothrombin Time 24.2 (H) 11.4 - 15.2 seconds   INR 2.1 (H)  0.8 - 1.2    Comment: (NOTE) INR goal varies based on device and disease states. Performed at [facility], [address], [place], [place] [address]   Resp panel by RT-PCR (RSV, Flu A&B, Covid) Anterior Nasal Swab     Status: None   Collection Time: 04/03/24  1:33 PM   Specimen: Anterior Nasal Swab  Result Value Ref Range   SARS Coronavirus 2 by RT PCR NEGATIVE NEGATIVE   Influenza A by PCR NEGATIVE NEGATIVE   Influenza B by PCR NEGATIVE NEGATIVE    Comment: (NOTE) The Xpert Xpress SARS-CoV-2/FLU/RSV plus assay is intended as an aid in the diagnosis of influenza from Nasopharyngeal swab specimens and should not be used as a sole basis for treatment. Nasal washings and aspirates are unacceptable for Xpert Xpress SARS-CoV-2/FLU/RSV testing.  Fact Sheet for Patients: bloggercourse.com  Fact Sheet for Healthcare Providers: seriousbroker.it  This test is not yet approved or cleared by the [place]  FDA and has been authorized for detection and/or diagnosis of SARS-CoV-2 by FDA under an Emergency Use Authorization (EUA). This EUA will remain in effect (meaning this test can be used) for the duration of the COVID-19 declaration under Section 564(b)(1) of the Act, 21 U.S.C. section 360bbb-3(b)(1), unless the authorization is terminated or revoked.     Resp Syncytial Virus by PCR NEGATIVE NEGATIVE    Comment: (NOTE) Fact Sheet for Patients: bloggercourse.com  Fact Sheet for Healthcare Providers: seriousbroker.it  This test is not yet approved or cleared by the [place]  FDA and has been authorized for detection and/or diagnosis of SARS-CoV-2 by FDA under an Emergency Use Authorization (EUA). This EUA will remain in effect (meaning this test can be used) for the duration of the COVID-19 declaration under Section 564(b)(1) of the Act, 21 U.S.C. section 360bbb-3(b)(1),  unless the authorization is terminated or revoked.  Performed at [facility], [address], [place], [place] [address]   Hepatic function panel     Status: Abnormal   Collection Time: 04/03/24  2:14 PM  Result Value Ref Range   Total Protein 6.1 (L) 6.5 - 8.1 g/dL   Albumin 3.1 (L) 3.5 - 5.0 g/dL   AST 28 15 - 41 U/L   ALT 14 0 - 44 U/L   Alkaline Phosphatase 85 38 - 126 U/L   Total Bilirubin 0.6 0.0 - 1.2  mg/dL   Bilirubin, Direct 0.3 (H) 0.0 - 0.2 mg/dL   Indirect Bilirubin 0.3 0.3 - 0.9 mg/dL    Comment: Performed at [facility], [address] [place], [place] [address]  Troponin T, High Sensitivity     Status: Abnormal   Collection Time: 04/03/24  2:14 PM  Result Value Ref Range   Troponin T High Sensitivity 67 (H) 0 - 19 ng/L    Comment: (NOTE) Biotin concentrations > 1000 ng/mL falsely decrease TnT results.  Serial cardiac troponin measurements are suggested.  Refer to the Links section for chest pain algorithms and additional  guidance. Performed at [facility], [address], [place], [place] [address]   Pro Brain natriuretic peptide     Status: Abnormal   Collection Time: 04/03/24  2:14 PM  Result Value Ref Range   Pro Brain Natriuretic Peptide 1,962.0 (H) <300.0 pg/mL    Comment: (NOTE) Age Group        Cut-Points    Interpretation  < 50 years     450 pg/mL       NT-proBNP > 450 pg/mL indicates                                ADHF is likely              50 to 75 years  900 pg/mL      NT-proBNP > 900 pg/mL indicates          ADHF is likely  > 75 years      1800 pg/mL     NT-proBNP > 1800 pg/mL indicates          ADHF is likely                           All ages    Results between       Indeterminate. Further clinical             300 and the cut-   information is needed to determine            point for age group   if ADHF is present.                                                             Elecsys proBNP II/ Elecsys proBNP II  STAT           Cut-Point                       Interpretation  300 pg/mL                    NT-proBNP <300pg/mL indicates                             ADHF is not likely  Performed at [facility], [address], [place], [place] [address]   Respiratory (~20 pathogens) panel by PCR     Status: None   Collection Time: 04/03/24  4:26 PM   Specimen: Nasopharyngeal Swab; Respiratory  Result Value Ref Range   Adenovirus NOT  DETECTED NOT DETECTED   Coronavirus 229E NOT DETECTED NOT DETECTED    Comment: (NOTE) The Coronavirus on the Respiratory Panel, DOES NOT test for the novel  Coronavirus (2019 nCoV)    Coronavirus HKU1 NOT DETECTED NOT DETECTED   Coronavirus NL63 NOT DETECTED NOT DETECTED   Coronavirus OC43 NOT DETECTED NOT DETECTED   Metapneumovirus NOT DETECTED NOT DETECTED   Rhinovirus / Enterovirus NOT DETECTED NOT DETECTED   Influenza A NOT DETECTED NOT DETECTED   Influenza B NOT DETECTED NOT DETECTED   Parainfluenza Virus 1 NOT DETECTED NOT DETECTED   Parainfluenza Virus 2 NOT DETECTED NOT DETECTED   Parainfluenza Virus 3 NOT DETECTED NOT DETECTED   Parainfluenza Virus 4 NOT DETECTED NOT DETECTED   Respiratory Syncytial Virus NOT DETECTED NOT DETECTED   Bordetella pertussis NOT DETECTED NOT DETECTED   Bordetella Parapertussis NOT DETECTED NOT DETECTED   Chlamydophila pneumoniae NOT DETECTED NOT DETECTED   Mycoplasma pneumoniae NOT DETECTED NOT DETECTED    Comment: Performed at [facility], [address], [place], [place] [address]  Sodium     Status: Abnormal   Collection Time: 04/03/24  6:05 PM  Result Value Ref Range   Sodium 126 (L) 135 - 145 mmol/L    Comment: Performed at [facility], [address], [place], [place] [address]  Procalcitonin     Status: None   Collection Time: 04/03/24  6:05 PM  Result Value Ref Range   Procalcitonin 0.69 ng/mL    Comment: (NOTE)   Sepsis PCT Algorithm          Lower Respiratory Tract Infection                                          PCT Algorithm -----------------------------------------------------------------  <0.5 ng/mL                    <0.10 ng/mL  Associated with low           Antibiotic therapy strongly   risk for progression          discouraged. Indicates absence   to severe sepsis              of bacteria infection  and/or septic shock             --------------------------------------------------------------  0.5-2.0 ng/mL                 0.10-0.25 ng/mL  Recommended to retest         Antibiotic therapy discouraged.  PCT within 6-24 hours         Bacterial infection unlikely  ------------------------------------------------------------  >2 ng/mL                      0.26-0.50 ng/mL  Associated with high risk     Antibiotic therapy encouraged.  for progression to severe     Bacterial infection possible  sepsis/and or septic shock    ------------------------------                                 >0.50 ng/mL                                Antibiotic therapy strongly  encouraged.                                Suggestive of presence of                                 bacterial infection.                                 -------------------------------------------------------------------  < or = 0.50 ng/mL OR          < or = 0.25 OR 80% decrease in PCT  80% decrease in PCT           Antibiotic therapy   Antibiotic therapy may        may be discontinued  be discontinued                                 Performed at [facility], [address], [place], [place] [address]   Urinalysis, Routine w reflex microscopic -Urine, Clean Catch     Status: Abnormal   Collection Time: 04/03/24  7:31 PM  Result Value Ref Range   Color, Urine YELLOW YELLOW   APPearance CLEAR CLEAR   Specific Gravity, Urine 1.020 1.005 - 1.030   pH 5.5 5.0 - 8.0   Glucose, UA NEGATIVE NEGATIVE mg/dL   Hgb urine dipstick NEGATIVE NEGATIVE   Bilirubin Urine SMALL (A)  NEGATIVE   Ketones, ur NEGATIVE NEGATIVE mg/dL   Protein, ur NEGATIVE NEGATIVE mg/dL   Nitrite NEGATIVE NEGATIVE   Leukocytes,Ua NEGATIVE NEGATIVE    Comment: Microscopic not done on urines with negative protein, blood, leukocytes, nitrite, or glucose < 500 mg/dL. Performed at [facility], [address] [place], [place] [address]   CBC     Status: Abnormal   Collection Time: 04/04/24  2:26 AM  Result Value Ref Range   WBC 6.7 4.0 - 10.5 K/uL   RBC 3.33 (L) 3.87 - 5.11 MIL/uL   Hemoglobin 10.3 (L) 12.0 - 15.0 g/dL   HCT 70.6 (L) 63.9 - 53.9 %   MCV 88.0 80.0 - 100.0 fL   MCH 30.9 26.0 - 34.0 pg   MCHC 35.2 30.0 - 36.0 g/dL   RDW 85.4 88.4 - 84.4 %   Platelets 237 150 - 400 K/uL   nRBC 0.0 0.0 - 0.2 %    Comment: Performed at [facility], [address], [place], [place] [address]  Basic metabolic panel     Status: Abnormal   Collection Time: 04/04/24  2:26 AM  Result Value Ref Range   Sodium 131 (L) 135 - 145 mmol/L   Potassium 3.6 3.5 - 5.1 mmol/L   Chloride 96 (L) 98 - 111 mmol/L   CO2 25 22 - 32 mmol/L   Glucose, Bld 103 (H) 70 - 99 mg/dL    Comment: Glucose reference range applies only to samples taken after fasting for at least 8 hours.   BUN 36 (H) 8 - 23 mg/dL   Creatinine, Ser 8.90 (H) 0.44 - 1.00 mg/dL   Calcium  9.0 8.9 - 10.3 mg/dL   GFR, Estimated 56 (L) >60 mL/min    Comment: (NOTE) Calculated using the CKD-EPI Creatinine Equation (2021)  Anion gap 10 5 - 15    Comment: Performed at [facility], [address] [place], [place] [address]    ECG   N/A  Telemetry   Paced rhythm - Personally Reviewed  Radiology    CT Head Wo Contrast Result Date: 04/03/2024 EXAM: CT HEAD WITHOUT CONTRAST 04/03/2024 02:18:17 PM TECHNIQUE: CT of the head was performed without the administration of intravenous contrast. Automated exposure control, iterative reconstruction, and/or weight based adjustment of the mA/kV was utilized to reduce the  radiation dose to as low as reasonably achievable. COMPARISON: None available. CLINICAL HISTORY: Headache, new onset (Age >= 51y). FINDINGS: BRAIN AND VENTRICLES: No acute hemorrhage. No evidence of acute infarct. No hydrocephalus. No extra-axial collection. No mass effect or midline shift. Atherosclerotic calcifications are present in the cavernous carotid arteries bilaterally and at the dural margin of both vertebral arteries. No hyperdense vessel is present. ORBITS: No acute abnormality. SINUSES: No acute abnormality. SOFT TISSUES AND SKULL: No acute soft tissue abnormality. No skull fracture. IMPRESSION: 1. No acute intracranial abnormality. Electronically signed by: Lonni Necessary MD 04/03/2024 02:21 PM EST RP Workstation: [id number]   DG Chest 2 View Result Date: 04/03/2024 CLINICAL DATA:  Chest pain. EXAM: CHEST - 2 VIEW COMPARISON:  Radiograph 03/17/2024. 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 Cardiomegaly is stable. Stable left pleural effusion and basilar opacity. There is a new small right pleural effusion. Vascular congestion. No pneumothorax. No acute osseous findings. IMPRESSION: 1. Stable cardiomegaly. 2. Stable left pleural effusion and basilar opacity. 3. New small right pleural effusion.  Vascular congestion. Electronically Signed   By: Andrea Gasman M.D.   On: 04/03/2024 13:30    Cardiac Studies   N/A  Assessment   Principal Problem:   Acute on chronic combined systolic and diastolic CHF (congestive heart failure) (HCC) Active Problems:   Hypothyroidism   Persistent atrial fibrillation (HCC)   AKI (acute kidney injury)   Hyponatremia   Malaise   Leukocytosis   Elevated troponin   Hypotension   Pleural effusion due to CHF (congestive heart failure) (HCC)   Anxiety   Plan   Feels better today - wants to go home. Would keep off Entresto , but ontherwise continue amiodarone  and home meds. Resume lasix  40 mg  daily. Has follow-up scheduled on 1/16.   [facility] will sign off.   Medication Recommendations:  as above Other recommendations (labs, testing, etc): d/c Entresto  Follow up as an outpatient:  Dr. Zenaida on 1/16   Time Spent Directly with Patient:  I have spent a total of 25 minutes with the patient reviewing hospital notes, telemetry, EKGs, labs and examining the patient as well as establishing an assessment and plan that was discussed personally with the patient.  > 50% of time was spent in direct patient care.  Length of Stay:  LOS: 2 days   Vinie KYM Maxcy, MD, [facility], FNLA, FACP  [place]  [facility]  Medical Director of the [facility] Lipid Disorders &  Cardiovascular Risk Reduction Clinic Diplomate of the American Board of Clinical Lipidology Attending Cardiologist  Direct Dial: [phone]  Fax: [phone]  Website:  [email or web address] Vinie BROCKS Brina Umeda 04/05/2024, 8:05 AM   "

## 2024-04-05 NOTE — Plan of Care (Signed)

## 2024-04-05 NOTE — Plan of Care (Signed)
  Problem: Education: Goal: Knowledge of General Education information will improve Description: Including pain rating scale, medication(s)/side effects and non-pharmacologic comfort measures Outcome: Adequate for Discharge   Problem: Health Behavior/Discharge Planning: Goal: Ability to manage health-related needs will improve Outcome: Adequate for Discharge   Problem: Clinical Measurements: Goal: Ability to maintain clinical measurements within normal limits will improve Outcome: Adequate for Discharge Goal: Will remain free from infection Outcome: Adequate for Discharge Goal: Diagnostic test results will improve Outcome: Adequate for Discharge Goal: Respiratory complications will improve Outcome: Adequate for Discharge Goal: Cardiovascular complication will be avoided Outcome: Adequate for Discharge   Problem: Activity: Goal: Risk for activity intolerance will decrease Outcome: Adequate for Discharge   Problem: Nutrition: Goal: Adequate nutrition will be maintained Outcome: Adequate for Discharge   Problem: Coping: Goal: Level of anxiety will decrease Outcome: Adequate for Discharge   

## 2024-04-06 ENCOUNTER — Other Ambulatory Visit (HOSPITAL_COMMUNITY): Payer: Self-pay

## 2024-04-11 NOTE — Progress Notes (Unsigned)
 "     Electrophysiology Clinic Note    Date:  04/12/2024  Patient ID:  [name] [name], DOB 19-Mar-1956, MRN [id number] PCP:  Sherial Bail, MD  Cardiologist:  None  Electrophysiologist:  Fonda Kitty, MD  Electrophysiology APP:  Dottie Vaquerano, NP    Discussed the use of AI scribe software for clinical note transcription with the patient, who gave verbal consent to proceed.   Patient Profile    Chief Complaint: 90d device follow-up, AFib follow-up  History of Present Illness: [name] [name] is a 68 y.o. female with PMH notable for CHB s/p PPM > ICD > CRT-D, persis afib ICM, HFrEF, PAD, HTN, hypothyroid ; seen today for Fonda Kitty, MD (previously Dr. Fernande > Dr. Cindie) for routine electrophysiology follow-up s/p Defibrillator implant and hospital follow-up.   She has significant device history, most recently had RV ICD lead implanted 12/2023, epicardial LV lead implanted via L thoracotomy 01/2024. Her device detected AFib, she was seen by AF clinic and started on eliquis . She underwent DCCV 02/2024 with ERAF. She was again hospitalized for increased SOB earlier this month, remained in AFib and was started on amiodarone , remained in afib at discharge.  She was again hospitalized 12/21 with concern for AoCHF along with AKI, hypotension. Entresto  held at discharge.   On follow-up today, she continues to feel tired and run down, believes it because of her difficult past year with multiple procedures/hospitalizations. She is not aware that she remains in AFib today, but does question whether the fatigue may be her symptom. She continues to take eliquis  BID without missing doses, no bleeding concerns. Continues to take amiodarone  daily.  She continues to limit PO fluid to , some days this is easier than others. She has lost about 10lbs, attributes this to fluid loss.   She denies chest pain, chest pressure. NO dizziness, LH, presyncope. No edema.      Arrhythmia/Device  History [employer] dual chamber PPM, initial implant R-sided 1990  2016 - atrial/ventr noise s/p extraction  2016 - L-sided reimplantation w CRT-P can, unable to cannulate cs   2025 - ICD lead added, RV pacing abandoned - LV epicardial lead placed 10/25    ROS:  Please see the history of present illness. All other systems are reviewed and otherwise negative.    Physical Exam    VS:  BP 128/62 (BP Location: Left Arm, Patient Position: Sitting, Cuff Size: Normal)   Pulse 68   Ht 5' 4 (1.626 m)   Wt 177 lb (80.3 kg)   LMP  (LMP Unknown)   SpO2 97%   BMI 30.38 kg/m  BMI: Body mass index is 30.38 kg/m.           Wt Readings from Last 3 Encounters:  04/12/24 177 lb (80.3 kg)  04/05/24 178 lb 9.2 oz (81 kg)  03/29/24 177 lb 12.8 oz (80.6 kg)     GEN- The patient is well appearing, alert and oriented x 3 today.   Lungs- Clear to ausculation bilaterally, normal work of breathing.  Heart- Regular rate and rhythm, no murmurs, rubs or gallops Extremities- No peripheral edema, warm, dry Skin-  device pocket well-healed, no tethering   Device interrogation done today and reviewed by myself:  Battery 4+ years Lead thresholds, impedence, sensing stable Dependent down to VVI 40 Persis Afib noted, 90% AF burden VP 98% CorVue low, but close to baseline  Turned ON autocapture in RV/LV leads   Studies Reviewed   Previous EP,  cardiology notes.    EKG is not ordered. Personal review of EKG from 04/03/2024 shows:  AS-VP at 83bpm        Coronary angiography 03/18/2024: LM: Normal LAD: Normal, no significant disease Lcx: Normal, no significant disease RCA: Dominant. Normal, no significant disease   LVEDP 23 mmHg   Right heart catheterization 03/18/2024: RA: 16 mmHg RV: 45/6 mmHg PA: 43/26 mmHg, mPAP 33 mmHg PCW: could not ob obtained PAPi 1.0   AO sats: 93% PA sats: 64%   CO: 5.2 L/min CI: 2.8 L/min/m2   Conclusion: Non coronary artery disease Elevated  filling pressures, including RA Severe biventricular failure Decompensated nonischemic cardiomyopathy Moderate pulmonary hypertension. WHOP Grp II  TTE, 03/16/2024  1. Left ventricular ejection fraction, by estimation, is 20 to 25%. The left ventricle has severely decreased function. The left ventricle demonstrates global hypokinesis. The left ventricular internal cavity size was severely dilated. There is severe eccentric left ventricular hypertrophy. Left ventricular diastolic parameters are consistent with Grade III diastolic dysfunction (restrictive). There is severe global hypokinesis with apical akinesis and swirling of contrast concerning for early thrombus formation.   2. Right ventricular systolic function is mildly reduced. The right ventricular size is normal. There is normal pulmonary artery systolic pressure.   3. Left atrial size was mildly dilated.   4. Lead in RA.   5. The mitral valve is normal in structure. No evidence of mitral valve regurgitation.   6. At least moderate TR possibly in setting of lead impingement. Tricuspid valve regurgitation is moderate.   7. The aortic valve is normal in structure. Aortic valve regurgitation is not visualized.   8. The inferior vena cava is dilated in size with <50% respiratory variability, suggesting right atrial pressure of 15 mmHg.   Comparison(s): Prior images reviewed side by side. EF is slightly worse and there is apical akinesis with possible early thrombus formation. No pericardial effusion.   TTE, 10/16/2020  1. Left ventricular ejection fraction, by estimation, is 25 to 30%. The left ventricle has severely decreased function. The left ventricle demonstrates global hypokinesis. The left ventricular internal cavity size was severely dilated. Left ventricular diastolic parameters are indeterminate. The average left ventricular global longitudinal strain is -8.8 %. The global longitudinal strain is abnormal.   2. Right ventricular systolic  function is normal. The right ventricular size is normal. There is normal pulmonary artery systolic pressure.   3. Left atrial size was mildly dilated.   4. A small pericardial effusion is present. The pericardial effusion is circumferential. There is no evidence of cardiac tamponade.   5. The mitral valve is normal in structure. Trivial mitral valve regurgitation. No evidence of mitral stenosis.   6. The aortic valve has an indeterminant number of cusps. Aortic valve regurgitation is not visualized. No aortic stenosis is present.   7. The inferior vena cava is normal in size with greater than 50% respiratory variability, suggesting right atrial pressure of 3 mmHg.    Assessment and Plan     #) persis Afib #) amiodarone  monitoring S/p DCCV 11/24 with ERAF Remains in Afib 200mg  amiodarone  daily Continue amiodarone  We discussed DCCV and patient is agreeable Recent LFT, thyroid  labs stable We briefly discussed that patient may be candidate for AF ablation if can hold sinus rhythm after DCCV, will defer to MD for final recommendation  #) Hypercoag d/t persis afib CHA2DS2-VASc Score = at least 5 [CHF History: 1, HTN History: 1, Diabetes History: 0, Stroke History: 0, Vascular Disease History:  1, Age Score: 1, Gender Score: 1].  Therefore, the patient's annual risk of stroke is 7.2 %.    Stroke ppx - 5mg  eliquis  BID, appropriately dosed No bleeding concerns  #) NICM s/p CRT-D Device functioning well, see paceart for details Battery 4+ years Stable lead measurements Dependent down to VVI 40 AFib as above  #) HFrEF EF remains low with BiV device CorVue low, but nearing baseline Patient limiting PO fluid She follows regularly with HF team Rhythm mgmt as above Will reassess EF once maintaining sinus rhythm     Informed Consent   Shared Decision Making/Informed Consent The risks (stroke, cardiac arrhythmias rarely resulting in the need for a temporary or permanent pacemaker, skin  irritation or burns and complications associated with conscious sedation including aspiration, arrhythmia, respiratory failure and death), benefits (restoration of normal sinus rhythm) and alternatives of a direct current cardioversion were explained in detail to Ms. Hefty and she agrees to proceed.        Current medicines are reviewed at length with the patient today.   The patient does not have concerns regarding her medicines.  The following changes were made today:  none  Labs/ tests ordered today include:  No orders of the defined types were placed in this encounter.    Disposition: Follow up with Dr. Kennyth  as usual post procedure   Signed, Chantal Needle, NP  04/12/2024  4:18 PM  Electrophysiology [facility] "

## 2024-04-12 ENCOUNTER — Ambulatory Visit: Payer: PRIVATE HEALTH INSURANCE | Attending: Cardiology | Admitting: Cardiology

## 2024-04-12 ENCOUNTER — Encounter: Payer: Self-pay | Admitting: Cardiology

## 2024-04-12 VITALS — BP 128/62 | HR 68 | Ht 64.0 in | Wt 177.0 lb

## 2024-04-12 DIAGNOSIS — Z95810 Presence of automatic (implantable) cardiac defibrillator: Secondary | ICD-10-CM

## 2024-04-12 DIAGNOSIS — D6869 Other thrombophilia: Secondary | ICD-10-CM | POA: Diagnosis not present

## 2024-04-12 DIAGNOSIS — Z5181 Encounter for therapeutic drug level monitoring: Secondary | ICD-10-CM | POA: Diagnosis not present

## 2024-04-12 DIAGNOSIS — I4819 Other persistent atrial fibrillation: Secondary | ICD-10-CM

## 2024-04-12 DIAGNOSIS — I428 Other cardiomyopathies: Secondary | ICD-10-CM | POA: Diagnosis not present

## 2024-04-12 DIAGNOSIS — Z79899 Other long term (current) drug therapy: Secondary | ICD-10-CM | POA: Diagnosis not present

## 2024-04-12 LAB — CUP PACEART INCLINIC DEVICE CHECK
Battery Remaining Longevity: 50 mo
Brady Statistic RA Percent Paced: 0.04 %
Brady Statistic RV Percent Paced: 98 %
Date Time Interrogation Session: 20251230163140
HighPow Impedance: 67.5 Ohm
Implantable Lead Connection Status: 753985
Implantable Lead Connection Status: 753985
Implantable Lead Connection Status: 753985
Implantable Lead Implant Date: 20160824
Implantable Lead Implant Date: 20160824
Implantable Lead Implant Date: 20250916
Implantable Lead Location: 753859
Implantable Lead Location: 753860
Implantable Lead Location: 753860
Implantable Pulse Generator Implant Date: 20250916
Lead Channel Impedance Value: 337.5 Ohm
Lead Channel Impedance Value: 375 Ohm
Lead Channel Impedance Value: 512.5 Ohm
Lead Channel Pacing Threshold Amplitude: 0.625 V
Lead Channel Pacing Threshold Amplitude: 0.75 V
Lead Channel Pacing Threshold Amplitude: 0.75 V
Lead Channel Pacing Threshold Amplitude: 2.75 V
Lead Channel Pacing Threshold Amplitude: 3 V
Lead Channel Pacing Threshold Pulse Width: 0.5 ms
Lead Channel Pacing Threshold Pulse Width: 0.5 ms
Lead Channel Pacing Threshold Pulse Width: 0.5 ms
Lead Channel Pacing Threshold Pulse Width: 0.8 ms
Lead Channel Pacing Threshold Pulse Width: 0.8 ms
Lead Channel Sensing Intrinsic Amplitude: 5 mV
Lead Channel Setting Pacing Amplitude: 1.625
Lead Channel Setting Pacing Amplitude: 1.75 V
Lead Channel Setting Pacing Amplitude: 3.75 V
Lead Channel Setting Pacing Pulse Width: 0.5 ms
Lead Channel Setting Pacing Pulse Width: 0.8 ms
Lead Channel Setting Sensing Sensitivity: 0.5 mV
Pulse Gen Serial Number: 111082542
Zone Setting Status: 755011

## 2024-04-12 MED ORDER — AMIODARONE HCL 200 MG PO TABS: 200.0000 mg | ORAL_TABLET | Freq: Every day | ORAL | 3 refills | Status: AC

## 2024-04-12 MED ORDER — FUROSEMIDE 40 MG PO TABS: 40.0000 mg | ORAL_TABLET | Freq: Every day | ORAL | 3 refills | Status: AC

## 2024-04-12 NOTE — Patient Instructions (Signed)
 Medication Instructions:  Your physician recommends that you continue on your current medications as directed. Please refer to the Current Medication list given to you today.  *If you need a refill on your cardiac medications before your next appointment, please call your pharmacy*  Lab Work: No labs ordered today    Testing/Procedures:     Dear [name] [name]  You are scheduled for a Cardioversion on Thursday, January 8 with Dr. Timothy Gollan.  Please arrive at the [facility] Entrance of [facility], [address], [place] [address] at 12:00 PM (This is One hour(s) prior to your procedure time).  Proceed to the Check-In Desk directly inside the entrance.  Procedure Parking: Use the entrance off of the [address] side of the hospital. Turn right upon entering and follow the driveway to parking that is directly in front of the [facility]. There is no valet parking available at this entrance, however there is an awning directly in front of the [facility] for drop off/ pick up for patients.    DIET:  Nothing to eat or drink after midnight except a sip of water  with medications (see medication instructions below)  MEDICATION INSTRUCTIONS: !!IF ANY NEW MEDICATIONS ARE STARTED AFTER TODAY, PLEASE NOTIFY YOUR PROVIDER AS SOON AS POSSIBLE!!     Continue taking your anticoagulant (blood thinner): Apixaban  (Eliquis ).  You will need to continue this after your procedure until you are told by your provider that it is safe to stop.      FYI:  For your safety, and to allow us  to monitor your vital signs accurately during the surgery/procedure we request: If you have artificial nails, gel coating, SNS etc, please have those removed prior to your surgery/procedure. Not having the nail coverings /polish removed may result in cancellation or delay of your surgery/procedure.  Your support person will be asked to wait in the waiting room during your procedure.   It is OK to have someone drop you off and come back when you are ready to be discharged.  You cannot drive after the procedure and will need someone to drive you home.  Bring your insurance cards.  *Special Note: Every effort is made to have your procedure done on time. Occasionally there are emergencies that occur at the hospital that may cause delays. Please be patient if a delay does occur.      Follow-Up: At [facility], you and your health needs are our priority.  As part of our continuing mission to provide you with exceptional heart care, our providers are all part of one team.  This team includes your primary Cardiologist (physician) and Advanced Practice Providers or APPs (Physician Assistants and Nurse Practitioners) who all work together to provide you with the care you need, when you need it.  Your next appointment:   1 month(s)  Provider:   Suzann Riddle, NP    Dr. Kennyth 4-6 weeks. They will call with an appointment.

## 2024-04-15 ENCOUNTER — Telehealth: Payer: Self-pay | Admitting: Cardiovascular Disease

## 2024-04-15 ENCOUNTER — Inpatient Hospital Stay: Payer: PRIVATE HEALTH INSURANCE | Admitting: Pulmonary Disease

## 2024-04-15 NOTE — Telephone Encounter (Signed)
" °  The patient stated that she has new insurance, [employer], and was informed that they need to be contacted to ensure her cardioversion is approved. She provided their phone number, [phone], to submit confirmation that the procedure is medically necessary. "

## 2024-04-16 ENCOUNTER — Ambulatory Visit: Payer: Self-pay | Admitting: Cardiology

## 2024-04-19 ENCOUNTER — Telehealth: Payer: Self-pay | Admitting: Cardiovascular Disease

## 2024-04-19 ENCOUNTER — Encounter: Payer: Self-pay | Admitting: Pulmonary Disease

## 2024-04-19 ENCOUNTER — Ambulatory Visit (INDEPENDENT_AMBULATORY_CARE_PROVIDER_SITE_OTHER): Payer: PRIVATE HEALTH INSURANCE | Admitting: Pulmonary Disease

## 2024-04-19 VITALS — BP 104/66 | HR 87 | Temp 98.1°F | Ht 64.0 in | Wt 174.6 lb

## 2024-04-19 DIAGNOSIS — I509 Heart failure, unspecified: Secondary | ICD-10-CM

## 2024-04-19 DIAGNOSIS — I428 Other cardiomyopathies: Secondary | ICD-10-CM

## 2024-04-19 DIAGNOSIS — I5022 Chronic systolic (congestive) heart failure: Secondary | ICD-10-CM

## 2024-04-19 DIAGNOSIS — Z87891 Personal history of nicotine dependence: Secondary | ICD-10-CM

## 2024-04-19 DIAGNOSIS — I4819 Other persistent atrial fibrillation: Secondary | ICD-10-CM

## 2024-04-19 DIAGNOSIS — J90 Pleural effusion, not elsewhere classified: Secondary | ICD-10-CM

## 2024-04-19 NOTE — Progress Notes (Unsigned)
 "  Subjective:    Patient ID: [name] [name], female    DOB: Sep 15, 1955, 69 y.o.   MRN: [id number]  Patient Care Team: Sherial Bail, MD as PCP - General (Internal Medicine) Kennyth Chew, MD as PCP - Electrophysiology (Clinical Cardiac Electrophysiology) Riddle, Suzann, NP as Nurse Practitioner (Clinical Cardiac Electrophysiology)  Chief Complaint  Patient presents with   Consult    No breathing problems today. 12/2- Thoracentesis removing 1.3L of yellow fluid.    BACKGROUND:69 y.o. female with medical history significant of Hypertension, hyperlipidemia, systolic CHF (20-25%), atrial fibrillation, complete heart block s/p PPM, hypothyroidism, peripheral artery disease s/p iliac stent, and GERD who presents for evaluation of recurrent pleural effusions in the setting of congestive heart failure.  It is uncertain who requested the consult.  Her primary care physician is Dr. Bail Sherial.   HPI Discussed the use of AI scribe software for clinical note transcription with the patient, who gave verbal consent to proceed.  History of Present Illness   [name] [name] is a 69 year old female with non-ischemic cardiomyopathy who presents for follow-up of recurrent pleural effusions.  The patient has a history of recurrent pleural effusions in the context of non-ischemic cardiomyopathy with severe biventricular failure and a left ventricular ejection fraction (LVEF) of 20%. Her medications have been adjusted recently; she is now on amiodarone , her carvedilol  dose has been halved, and Entresto  has been temporarily discontinued due to blood pressure concerns.  Since making these changes she has noted marked improvement in her shortness of breath and fluid retention.  She continues to experience persistent fatigue and atrial fibrillation. No current shortness of breath or swelling in her legs and ankles. Despite fatigue, she has been more active recently and experiences eye discomfort, which she  attributes to needing new glasses.  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 She denies any financial planner. She reports a history of COVID-19 infection in late August, after which she developed a persistent raspy cough with mucus, primarily in the mornings, but denies any sinus problems.  She is currently dealing with insurance issues related to her Medicare Advantage plan, which has affected her access to certain medical services.     Chest x-rays were reviewed.  Her last chest x-ray was on 4 December during her hospitalization and it showed cardiomegaly and moderate size left pleural effusion and left basilar atelectasis.  There were also stable bronchitic changes.  A CT abdomen and pelvis was performed on 20 March 2024 and showed a very small partially loculated left pleural effusion with left lower lobe atelectasis, right lung base clear and no effusion noted.  She had thoracentesis on 15 March 2024 yielding 1.3 L of straw-colored fluid.  Cytology was negative, pH was 8.2, remainder of the fluid more consistent with transudative versus pseudo exudate.   Review of Systems A 10 point review of systems was performed and it is as noted above otherwise negative.   Past Medical History:  Diagnosis Date   AICD (automatic cardioverter/defibrillator) present    St. Jude   Anxiety    takes Klonopin  daily as needed   Arthritis    Back pain    reason unknown   Chronic systolic CHF (congestive heart failure) (HCC)    a. Echo 10/2013: EF 40-45 (apical images 35-40%); b. Echo 12/2014: EF 35-40%; c. 06/2020 Echo: EF 25-30%, glob  HK, Gr2 DD, RVSP ~ ; d. 10/2020 Echo: EF 25-30%, glob HK. Nl RV fxn, nl PASP, mildly dil LA. Small peric eff, triv MR.   Complete AV block (HCC)    a. s/p pacemaker insertion 1996; b. s/p contralateral side St. Jude generator  change 11/2014.   Dizziness    occasionally   GERD (gastroesophageal reflux disease)    takes Zantac  daily as needed   Gout    Headache    occasionally   History of blood clots    in heart-was on Coumadin---this many yrs ago   History of colon polyps    benign   Hyperlipidemia    takes Lovastatin  daily   Hypertension    hx of-since hip replacement MD took resident off meds 10/30/14 b/c well controlled   Hypothyroid    takes Synthroid  daily   Joint pain    Joint swelling    Moderate Pericardial effusion    a. 07/2020 Echo: Mod effusion w/o tamponade; b. 10/2020 Echo: small peric eff w/o tamponade.   NICM (nonischemic cardiomyopathy) (HCC)    a. tachycardia induced->EF 40-45% (2010), 35-40% (10/2013), 35-40% Gr1 DD, mod TR (12/2014);  b. 01/2015 Cath: Nl cors, EF 30-35%, glob HK; c. 06/2020 Echo: EF 25-30%; d. 10/2020 Echo: EF 25-30%.   Numbness and tingling    left leg and right arm   Ovarian cancer (HCC)    ovarian, skin    Pacemaker lead failure--noise on atrial greater than ventricular lead with ventricular backup pulse pacing 10/17/2014   Peripheral artery disease    stent on lt illiac    Past Surgical History:  Procedure Laterality Date   ABDOMINAL HYSTERECTOMY     ANGIOPLASTY / STENTING ILIAC     BIV UPGRADE N/A 12/29/2023   Procedure: BIV UPGRADE;  Surgeon: Cindie Ole DASEN, MD;  Location: [facility];  Service: Cardiovascular;  Laterality: N/A;   CARDIAC CATHETERIZATION N/A 02/08/2015   Procedure: Left Heart Cath and Coronary Angiography;  Surgeon: Deatrice DELENA Cage, MD;  Location: [facility] INVASIVE CV LAB;  Service: Cardiovascular;  Laterality: N/A;   CARDIOVERSION N/A 03/07/2024   Procedure: CARDIOVERSION;  Surgeon: Raford Riggs, MD;  Location: [facility] INVASIVE CV LAB;  Service: Cardiovascular;  Laterality: N/A;   CERVICAL CERCLAGE  1994   COLONOSCOPY     DG BARIUM SWALLOW ([facility] HX)     DILATION AND CURETTAGE OF UTERUS     EP IMPLANTABLE DEVICE Left 12/06/2014    Procedure: Implantation of cardiac resynchronization therapy pacemaker;  Surgeon: Danelle LELON Birmingham, MD;  Location: [facility] OR;  Service: Cardiovascular;  Laterality: Left;   EPICARDIAL PACING LEAD PLACEMENT N/A 02/05/2024   Procedure: INSERTION, EPICARDIAL ELECTRODE LEAD;  Surgeon: Lucas Dorise POUR, MD;  Location: [facility] OR;  Service: Thoracic;  Laterality: N/A;   HERNIA MESH REMOVAL Right    incisional   HERNIA REPAIR     ILIAC ARTERY STENT  04/05/2010   Left artery stenting   INSERT / REPLACE / REMOVE PACEMAKER     JOINT REPLACEMENT  10/31/2014   hip   KNEE SURGERY Right    LEAD INSERTION N/A 12/29/2023   Procedure: LEAD INSERTION;  Surgeon: Cindie Ole DASEN, MD;  Location: [facility];  Service: Cardiovascular;  Laterality: N/A;   PACEMAKER INSERTION  2012   Had replaced in July 2012, first placed in 1996   PACEMAKER LEAD REMOVAL Right 12/06/2014   Procedure: REMOVAL OF RV AND RA PACEMAKER LEADS;  Surgeon: Danelle LELON Birmingham, MD;  Location: [facility] OR;  Service: Cardiovascular;  Laterality: Right;  DR. BARTLE TO BACK UP    PACEMAKER PLACEMENT Left 12/06/2014   RIGHT/LEFT HEART CATH AND CORONARY ANGIOGRAPHY N/A 03/18/2024   Procedure: RIGHT/LEFT HEART CATH AND CORONARY ANGIOGRAPHY;  Surgeon: Elmira Newman PARAS, MD;  Location: [facility];  Service: Cardiovascular;  Laterality: N/A;   THORACOTOMY Left 02/05/2024   Procedure: THORACOTOMY, MAJOR;  Surgeon: Lucas Dorise POUR, MD;  Location: [facility] OR;  Service: Thoracic;  Laterality: Left;   TOTAL ABDOMINAL HYSTERECTOMY W/ BILATERAL SALPINGOOPHORECTOMY  07/2008   Ovarian cancer   TOTAL HIP ARTHROPLASTY Right 10/31/2014   Procedure: TOTAL HIP ARTHROPLASTY ANTERIOR APPROACH;  Surgeon: Ozell Flake, MD;  Location: [facility] ORS;  Service: Orthopedics;  Laterality: Right;    Patient Active Problem List   Diagnosis Date Noted   Hyponatremia 04/03/2024   Malaise 04/03/2024   Leukocytosis 04/03/2024   Elevated troponin 04/03/2024   Hypotension 04/03/2024    Pleural effusion due to CHF (congestive heart failure) (HCC) 04/03/2024   Anxiety 04/03/2024   Hematuria 03/20/2024   Elevated brain natriuretic peptide (BNP) level 03/18/2024   Paroxysmal atrial fibrillation (HCC) 03/18/2024   Acute on chronic combined systolic and diastolic CHF (congestive heart failure) (HCC) 03/17/2024   Atrial flutter (HCC) 03/17/2024   AKI (acute kidney injury) 03/17/2024   Hyponatremia syndrome 03/15/2024   Recurrent left pleural effusion 03/15/2024   Persistent atrial fibrillation (HCC) 03/07/2024   S/P thoracotomy 02/05/2024   ICD (implantable cardioverter-defibrillator) in place 12/29/2023   Chronic gout involving toe of right foot without tophus 02/13/2017   Hypothyroidism 02/01/2016   Atrophic vaginitis 07/18/2015   History of ovarian cancer 07/18/2015   Effort angina    Heart block AV complete (HCC)    Nonischemic cardiomyopathy (HCC)    Chronic systolic CHF (congestive heart failure) (HCC)    Insomnia 01/17/2015   Primary hypertension 01/17/2015   Pure hypercholesterolemia 01/17/2015   Primary osteoarthritis of right hip 10/31/2014   LV dysfunction 10/27/2014   Peripheral artery disease    ATHEROSCLEROSIS W /INT CLAUDICATION 03/18/2010   PACEMAKER, PERMANENT 11/29/2009   Mixed hyperlipidemia 06/23/2008   Secondary cardiomyopathy (HCC) 06/23/2008   AV BLOCK, COMPLETE 06/23/2008    Family History  Problem Relation Age of Onset   Hyperlipidemia Mother    Polymyalgia rheumatica Mother    Cancer Father        died of lung cancer   Lung cancer Father    Breast cancer Sister    Breast cancer Paternal Aunt    Diabetes Brother    Arrhythmia Son     Social History   Tobacco Use   Smoking status: Former    Current packs/day: 0.00    Types: Cigarettes    Quit date: 07/17/2008    Years since quitting: 15.7   Smokeless tobacco: Never   Tobacco comments:    Former smoker 02/10/24  Substance Use Topics   Alcohol use: Not Currently     Comment: rum daily-previously drank beer daily    Allergies[1]  Active Medications[2]  Immunization History  Administered Date(s) Administered   Influenza Inj Mdck Quad With Preservative 01/21/2018   Influenza,inj,Quad PF,6+ Mos 01/18/2019, 01/23/2021   Influenza-Unspecified 01/17/2016, 01/21/2017, 01/18/2019, 01/18/2020, 01/23/2021   Zoster Recombinant(Shingrix) 07/19/2022, 12/19/2022        Objective:     Vitals:   04/19/24 1425  BP: 104/66  Pulse: 87  Temp: 98.1 F (36.7 C)  Height: 5' 4 (1.626 m)  Weight: 174 lb  9.6 oz (79.2 kg)  SpO2: 96%  TempSrc: Temporal  BMI (Calculated): 29.96     SpO2: 96 %  GENERAL: Overweight woman, no acute distress, fully ambulatory, no conversational dyspnea. HEAD: Normocephalic, atraumatic.  EYES: Pupils equal, round, reactive to light.  No scleral icterus.  MOUTH: Dentition intact, oral mucosa moist.  No thrush. NECK: Supple. No thyromegaly. Trachea midline. No JVD.  No adenopathy. PULMONARY: Good air entry bilaterally.  No adventitious sounds.  No dullness to percussion. CARDIOVASCULAR: S1 and S2.  Atrial fibs with controlled ventricular response, no murmurs or gallops noted.  ABDOMEN: Benign. MUSCULOSKELETAL: No joint deformity, no clubbing, no edema.  NEUROLOGIC: No overt focal deficit, no gait disturbance, speech is fluent. SKIN: Intact,warm,dry. PSYCH: Mood and behavior normal.        Assessment & Plan:     ICD-10-CM   1. Pleural effusion due to CHF (congestive heart failure) (HCC)  I50.9 DG Chest 2 View    2. Nonischemic cardiomyopathy (HCC)  I42.8     3. Chronic systolic CHF (congestive heart failure) (HCC)  I50.22 DG Chest 2 View    4. Persistent atrial fibrillation (HCC)  I48.19       Orders Placed This Encounter  Procedures   DG Chest 2 View    Standing Status:   Future    Expected Date:   04/26/2024    Expiration Date:   04/19/2025    Reason for Exam (SYMPTOM  OR DIAGNOSIS REQUIRED):   CHF,pleural  effusion    Preferred imaging location?:   [facility]   Discussion:    Recurrent pleural effusions secondary to severe non-ischemic cardiomyopathy Recurrent pleural effusions likely due to decompensated heart failure. Currently, no significant fluid accumulation as she is not experiencing shortness of breath and physical examination shows symmetrical breath sounds and no dullness to percussion. Recent medication adjustments include initiation of amiodarone , reduction of carvedilol , and temporary discontinuation of Entresto  due to hypotension. Scheduled for cardioversion to address atrial fibrillation, which may also help with fluid management. Clinically, the fluid was not significant and likely related to previous decompensation. - Ordered chest x-ray to assess for pleural effusion - Coordinated with insurance for preferred location for x-ray - Scheduled follow-up in 4-6 weeks to reassess condition   Advised if symptoms do not improve or worsen, to please contact office for sooner follow up or seek emergency care.    I spent xxx minutes of dedicated to the care of this patient on the date of this encounter to include pre-visit review of records, face-to-face time with the patient discussing conditions above, post visit ordering of testing, clinical documentation with the electronic health record, making appropriate referrals as documented, and communicating necessary findings to members of the patients care team.   C. Leita Sanders, MD Advanced Bronchoscopy PCCM [facility]    *This note was dictated using voice recognition software/Dragon.  Despite best efforts to proofread, errors can occur which can change the meaning. Any transcriptional errors that result from this process are unintentional and may not be fully corrected at the time of dictation.     [1]  Allergies Allergen Reactions   Marshmallow [Althaea Officinalis]     Rash, hives, itching, SOB  Pt  states this is no longer an issue for her   Other     Jello - Rash, hives, itching, SOB  Pt states is no longer an issue for her   Latex Rash   Nickel Rash  [2]  Current Meds  Medication  Sig   acetaminophen  (TYLENOL ) 500 MG tablet Take 500 mg by mouth every 6 (six) hours as needed for mild pain (pain score 1-3) or moderate pain (pain score 4-6).   amiodarone  (PACERONE ) 200 MG tablet Take 1 tablet (200 mg total) by mouth daily.   apixaban  (ELIQUIS ) 5 MG TABS tablet Take 1 tablet (5 mg total) by mouth 2 (two) times daily.   carvedilol  (COREG ) 6.25 MG tablet Take 3.125 mg by mouth 2 (two) times daily with a meal.   Cholecalciferol (VITAMIN D3) 25 MCG (1000 UT) CAPS Take 1,000 Units by mouth at bedtime.   clonazePAM  (KLONOPIN ) 0.5 MG tablet TAKE 1 TABLET BY MOUTH ONCE DAILY AS NEEDED FOR ANXIETY/ SLEEP   cyanocobalamin (VITAMIN B12) 1000 MCG tablet Take 1,000 mcg by mouth at bedtime.   furosemide  (LASIX ) 40 MG tablet Take 1 tablet (40 mg total) by mouth daily.   levothyroxine  (SYNTHROID ) 112 MCG tablet Take 1 tablet by mouth once daily   lovastatin  (MEVACOR ) 20 MG tablet TAKE 1 TABLET BY MOUTH AT BEDTIME   oxyCODONE  (OXY IR/ROXICODONE ) 5 MG immediate release tablet Take 1 tablet (5 mg total) by mouth every 6 (six) hours as needed for severe pain (pain score 7-10).   spironolactone  (ALDACTONE ) 25 MG tablet Take 12.5 mg by mouth daily.   "

## 2024-04-19 NOTE — Telephone Encounter (Signed)
 Pt states her insurance company would like more information about the cardioversion

## 2024-04-19 NOTE — Patient Instructions (Signed)
 VISIT SUMMARY:  You came in today for a follow-up visit regarding your recurrent pleural effusions, which are related to your severe non-ischemic cardiomyopathy. We discussed your current symptoms, recent medication changes, and your activity levels. You mentioned experiencing persistent fatigue and eye discomfort, but no shortness of breath or swelling in your legs and ankles.  YOUR PLAN:  -RECURRENT PLEURAL EFFUSIONS SECONDARY TO SEVERE NON-ISCHEMIC CARDIOMYOPATHY: Recurrent pleural effusions are the buildup of excess fluid around the lungs, often due to your heart condition. Currently, there is no significant fluid accumulation as you are not experiencing shortness of breath. We have adjusted your medications recently, including starting amiodarone , reducing your carvedilol  dose, and temporarily stopping Entresto  due to low blood pressure. You are scheduled for a cardioversion procedure to address your atrial fibrillation, which may also help manage the fluid buildup. A chest x-ray has been ordered to check for any fluid around your lungs, and we have coordinated with your insurance for the preferred location for this x-ray. We will follow up in 4-6 weeks to reassess your condition.  INSTRUCTIONS:  Please get a chest x-ray as ordered, and we will follow up in 4-6 weeks to reassess your condition. Make sure to coordinate with your insurance for the preferred location for the x-ray.

## 2024-04-21 ENCOUNTER — Ambulatory Visit: Admitting: Anesthesiology

## 2024-04-21 ENCOUNTER — Encounter
Admission: RE | Disposition: A | Discharge status: Home / Self Care | Payer: Self-pay | Source: Home / Self Care | Attending: Cardiovascular Disease

## 2024-04-21 ENCOUNTER — Ambulatory Visit
Admission: RE | Admit: 2024-04-21 | Discharge: 2024-04-21 | Disposition: A | Discharge status: Home / Self Care | Payer: PRIVATE HEALTH INSURANCE | Attending: Cardiovascular Disease | Admitting: Cardiovascular Disease

## 2024-04-21 ENCOUNTER — Other Ambulatory Visit: Payer: Self-pay

## 2024-04-21 ENCOUNTER — Encounter: Payer: Self-pay | Admitting: Cardiovascular Disease

## 2024-04-21 DIAGNOSIS — K5900 Constipation, unspecified: Secondary | ICD-10-CM | POA: Diagnosis not present

## 2024-04-21 DIAGNOSIS — I739 Peripheral vascular disease, unspecified: Secondary | ICD-10-CM | POA: Diagnosis not present

## 2024-04-21 DIAGNOSIS — F419 Anxiety disorder, unspecified: Secondary | ICD-10-CM | POA: Insufficient documentation

## 2024-04-21 DIAGNOSIS — R319 Hematuria, unspecified: Secondary | ICD-10-CM | POA: Insufficient documentation

## 2024-04-21 DIAGNOSIS — I4510 Unspecified right bundle-branch block: Secondary | ICD-10-CM | POA: Diagnosis not present

## 2024-04-21 DIAGNOSIS — Z950 Presence of cardiac pacemaker: Secondary | ICD-10-CM | POA: Insufficient documentation

## 2024-04-21 DIAGNOSIS — I442 Atrioventricular block, complete: Secondary | ICD-10-CM | POA: Diagnosis not present

## 2024-04-21 DIAGNOSIS — I5023 Acute on chronic systolic (congestive) heart failure: Secondary | ICD-10-CM | POA: Insufficient documentation

## 2024-04-21 DIAGNOSIS — Z95820 Peripheral vascular angioplasty status with implants and grafts: Secondary | ICD-10-CM | POA: Diagnosis not present

## 2024-04-21 DIAGNOSIS — I4819 Other persistent atrial fibrillation: Secondary | ICD-10-CM | POA: Diagnosis not present

## 2024-04-21 DIAGNOSIS — E871 Hypo-osmolality and hyponatremia: Secondary | ICD-10-CM | POA: Insufficient documentation

## 2024-04-21 DIAGNOSIS — Z538 Procedure and treatment not carried out for other reasons: Secondary | ICD-10-CM | POA: Diagnosis not present

## 2024-04-21 DIAGNOSIS — I110 Hypertensive heart disease with heart failure: Secondary | ICD-10-CM | POA: Diagnosis not present

## 2024-04-21 DIAGNOSIS — I4891 Unspecified atrial fibrillation: Secondary | ICD-10-CM | POA: Diagnosis present

## 2024-04-21 HISTORY — PX: CARDIOVERSION: SHX1299

## 2024-04-21 SURGERY — CARDIOVERSION
Anesthesia: General

## 2024-04-21 MED ORDER — SODIUM CHLORIDE 0.9% FLUSH: 3.0000 mL | INTRAVENOUS | Status: DC | PRN

## 2024-04-21 MED ORDER — HYDROCORTISONE 1 % EX CREA
1.0000 | TOPICAL_CREAM | Freq: Three times a day (TID) | CUTANEOUS | Status: DC | PRN
  Filled 2024-04-21: qty 28

## 2024-04-21 MED ORDER — SODIUM CHLORIDE 0.9% FLUSH: 3.0000 mL | Freq: Two times a day (BID) | INTRAVENOUS | Status: DC

## 2024-04-21 MED ORDER — SODIUM CHLORIDE 0.9 % IV SOLN: 250.0000 mL | INTRAVENOUS | Status: DC | PRN

## 2024-04-21 NOTE — Progress Notes (Signed)
 Patient in NSR, confirmed by Chantal Needle, NP. Dr. Gollan aware. Patient discharged in stable condition.

## 2024-04-22 ENCOUNTER — Encounter: Payer: Self-pay | Admitting: Cardiovascular Disease

## 2024-04-25 ENCOUNTER — Encounter: Payer: Self-pay | Admitting: *Deleted

## 2024-04-26 ENCOUNTER — Ambulatory Visit

## 2024-04-28 NOTE — Progress Notes (Signed)
 "  ADVANCED HEART FAILURE NEW PATIENT CLINIC NOTE  Referring Physician: Sherial Bail, MD  Primary Care: Sherial Bail, MD Primary Cardiologist:  HPI: [name] [name] is a 69 y.o. female with a PMH of chronic systolic heart failure who presents for initial visit for further evaluation and treatment of heart failure/cardiomyopathy.      Patient with a hx of CHB s/p PPM, nonischemic cardiomyopathy, chronic HFrEF, persistent A-Fib s/p recent DCCV 03/07/2024 w/ ERAF, PVD, aortic and coronary calcifications per CT, hypertension and hypothyroidism.   She developed CHB in her 30s, necessitating PPM. 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 Underwent left thoracotomy for LV epicardial lead placement 01/2024.   Admission 03/2024 for a/c CHF, required thoracentesis, elevated filling pressures. Has not tolerated medical therapy well.      SUBJECTIVE:  Patient reports that she is doing fair.  She has limited by shortness of breath with more than moderate exertion and reports that her symptoms have progressed in the last 6 months.  She underwent CRT-D in October but has not felt significantly better.  Has had issues tolerating medical therapy in the past.  Reports that she used to drink alcohol heavily, 750 mL of rum usually over the course of a week.  Has not drank since her recent hospitalization.  PMH, current medications, allergies, social history, and family history reviewed in epic.  PHYSICAL EXAM: Vitals:   04/29/24 1116  BP: 116/64  Pulse: 82  SpO2: 96%   GENERAL: Well nourished and in no apparent distress at rest.  PULM:  Normal work of breathing, clear to auscultation bilaterally. Respirations are unlabored.  CARDIAC:  JVP: Flat         Normal rate with regular rhythm. No murmurs, rubs or gallops.  No edema. Warm and well perfused extremities. ABDOMEN: Soft, non-tender,  non-distended. NEUROLOGIC: Patient is oriented x3 with no focal or lateralizing neurologic deficits.    DATA REVIEW  ECG: 04/2024: BiV paced  ECHO: 7/22: LVEF 25 to 30% 03/2024: LVEF 20 to 25%, severe eccentric LVH, grade 3 diastolic dysfunction  CATH: 03/2024: Normal coronary arteriography, RA 16, PA 43/26 (33), no wedge, CO/CI 5.2/2.8    ASSESSMENT & PLAN:  Chronic systolic heart failure: Nonischemic cardiomyopathy, longstanding with conduction system issues back in her 30s.  No significant family history.  Amyloid is a consideration, but does not have significant comorbidities besides potentially neuropathy.  Has extensive alcohol use - Device is not MRI compatible - Will start with PYP scan, brief discussion about endomyocardial biopsy and she would like to defer - Unfortunate has not tolerated much in the way of goal-directed medical therapy - CPX for baseline - Encouraged alcohol cessation, thankfully has already quit - Continue carvedilol  6.25 mg twice daily - Continue spironolactone  12.5 mg daily - Did not tolerate Entresto  or digoxin  - Frequent UTIs send no SGLT2 - CRT-D in place, repeat echocardiogram around 3 months after alcohol cessation  ? Possible LV Thrombus - noted on 2D Echo  - she is on Eliquis  for afib, will continue  Afib: Recent cardioversion. - Continue Eliquis , amiodarone  200 mg daily  Follow up in 2-3 months  I spent 63 minutes caring for this patient today including face to face time, ordering and reviewing labs, reviewing records from prior EP, discussing advanced therapies, seeing the patient, documenting in the record, and arranging follow ups.   Morene Brownie, MD Advanced Heart Failure  Mechanical Circulatory Support 05/01/24 "

## 2024-04-29 ENCOUNTER — Ambulatory Visit (HOSPITAL_COMMUNITY)
Admission: RE | Admit: 2024-04-29 | Discharge: 2024-04-29 | Disposition: A | Discharge status: Home / Self Care | Payer: PRIVATE HEALTH INSURANCE | Source: Ambulatory Visit | Attending: Cardiology | Admitting: Cardiology

## 2024-04-29 ENCOUNTER — Encounter (HOSPITAL_COMMUNITY): Payer: Self-pay | Admitting: Cardiology

## 2024-04-29 ENCOUNTER — Encounter: Payer: Self-pay | Admitting: Cardiology

## 2024-04-29 VITALS — BP 116/64 | HR 82 | Wt 173.0 lb

## 2024-04-29 DIAGNOSIS — I5022 Chronic systolic (congestive) heart failure: Secondary | ICD-10-CM | POA: Insufficient documentation

## 2024-04-29 DIAGNOSIS — I700 Atherosclerosis of aorta: Secondary | ICD-10-CM | POA: Diagnosis not present

## 2024-04-29 DIAGNOSIS — F1091 Alcohol use, unspecified, in remission: Secondary | ICD-10-CM | POA: Insufficient documentation

## 2024-04-29 DIAGNOSIS — I428 Other cardiomyopathies: Secondary | ICD-10-CM | POA: Diagnosis not present

## 2024-04-29 DIAGNOSIS — I4819 Other persistent atrial fibrillation: Secondary | ICD-10-CM | POA: Diagnosis not present

## 2024-04-29 DIAGNOSIS — E039 Hypothyroidism, unspecified: Secondary | ICD-10-CM | POA: Diagnosis not present

## 2024-04-29 DIAGNOSIS — I739 Peripheral vascular disease, unspecified: Secondary | ICD-10-CM | POA: Insufficient documentation

## 2024-04-29 DIAGNOSIS — Z79899 Other long term (current) drug therapy: Secondary | ICD-10-CM | POA: Insufficient documentation

## 2024-04-29 DIAGNOSIS — Z7901 Long term (current) use of anticoagulants: Secondary | ICD-10-CM | POA: Insufficient documentation

## 2024-04-29 DIAGNOSIS — I110 Hypertensive heart disease with heart failure: Secondary | ICD-10-CM | POA: Insufficient documentation

## 2024-04-29 DIAGNOSIS — I442 Atrioventricular block, complete: Secondary | ICD-10-CM | POA: Insufficient documentation

## 2024-04-29 DIAGNOSIS — I2510 Atherosclerotic heart disease of native coronary artery without angina pectoris: Secondary | ICD-10-CM | POA: Diagnosis not present

## 2024-04-29 DIAGNOSIS — Z950 Presence of cardiac pacemaker: Secondary | ICD-10-CM | POA: Insufficient documentation

## 2024-04-29 MED ORDER — POTASSIUM CHLORIDE CRYS ER 10 MEQ PO TBCR: 10.0000 meq | EXTENDED_RELEASE_TABLET | Freq: Every day | ORAL | 3 refills | Status: AC

## 2024-04-29 NOTE — Patient Instructions (Addendum)
 STOP entresto .  You are scheduled for a Cardiopulmonary Exercise (CPX) Test as [facility] on: Date: 05/05/24     Time: 11.00 am   Expect to be in the lab for 2 hours. Please plan to arrive 30 minutes prior to your appointment. You may be asked to reschedule your test if you arrive 20 minutes or more after your scheduled appointment time.  Main Campus address: [address] [place], [place] [address] You may arrive to the Main Entrance A or Entrance C (free valet parking is available at both). -Main Entrance A (on [address]) :proceed to admitting for check in -Entrance C (on [employer]): proceed to [employer] parking or under hospital deck parking using this code ___2026______  Check In: [facility] waiting room (1st floor)   General Instructions for the day of the test (Please follow all instructions from your physician): Refrain from ingesting a heavy meal, alcohol, or caffeine or using tobacco products within 2 hours of the test (DO NOT FAST for mare than 8 hours). You may have all other non-alcoholic, non -caffeinated beverage,a light snack (crackers,a piece of fruit, carrot sticks, toast bagel,etc) up to your appointment. Avoid significant exertion or exercise within 24 hours of your test. Be prepared to exercise and sweat. Your clothing should permit freedom of movement and include walking or running shoes. Women bring loose fitting short sleeved blouse.  This evaluation may be fatiguing and you may wish ti have someone accompany you to the assessment to drive you home afterward. Bring a list of your medications with you, including dosage and frequency you take the medications (  I.e.,once per day, twice per day, etc). Take all medications as prescribed, unless noted below or instructed to do so by your physician.  Please do not take the following medications prior to your  CPX:  _________________________________________________  _________________________________________________  Brief description of the test: A brief lung test will be performed. This will involve you taking deep breaths and blowing hard and fast through your mouth. During these , a clip will be on your nose and you will be breathing through a breathing device.   For the exercise portion of the test you will be walking on a treadmill, or riding a stationary bike, to your maximal effor or until symptoms such as chest pain, shortness of breath, leg pain or dizziness limit your exercise. You will be breathing in and out of a breathing device through your mouth (a clip will be on your nose again). Your heart rate, ECG, blood pressure, oxygen  saturations, breathing rate and depth, amount of oxygen  you consume and amount of carbon dioxide you produce will be measured and monitored throughout the exercise test.  If you need to cancel or reschedule your appointment please call [phone] If you have further questions please call your physician or Damien Nunnery at [phone]  You have been ordered a PYP Scan.  This is done the [facility] [address] [place], [place], [address].  When you come for this test please plan to be there 2-3 hours.  Your physician recommends that you schedule a follow-up appointment in: 3 months.  If you have any questions or concerns before your next appointment please send us  a message through [facility] or call our office at [phone].    TO LEAVE A MESSAGE FOR THE NURSE SELECT OPTION 2, PLEASE LEAVE A MESSAGE INCLUDING: YOUR NAME DATE OF BIRTH CALL BACK NUMBER REASON FOR CALL**this is  important as we prioritize the call backs  YOU WILL RECEIVE A CALL BACK THE SAME DAY AS LONG AS YOU CALL BEFORE 4:00 PM  At the [facility], you and your health needs are our priority. As part of our continuing mission to provide you with  exceptional heart care, we have created designated Provider Care Teams. These Care Teams include your primary Cardiologist (physician) and Advanced Practice Providers (APPs- Physician Assistants and Nurse Practitioners) who all work together to provide you with the care you need, when you need it.   You may see any of the following providers on your designated Care Team at your next follow up: Dr Toribio Fuel Dr Ezra Shuck Dr. Morene Brownie Greig Mosses, NP Caffie Shed, [place] [facility] [place], [place] Beckey Coe, NP Jordan Lee, NP Ellouise Class, NP Tinnie Redman, PharmD Jaun Bash, PharmD   Please be sure to bring in all your medications bottles to every appointment.    Thank you for choosing [facility]

## 2024-05-02 ENCOUNTER — Telehealth (HOSPITAL_COMMUNITY): Payer: Self-pay

## 2024-05-02 NOTE — Telephone Encounter (Signed)
 Patient called and states that she pulled something in her groin and is unable to exercise this week. She wants to push CPX out until next week. This has been rescheduled. Patient aware of appointment time and date.   Advised patient to call back to office with any issues, questions, or concerns. Patient verbalized understanding.

## 2024-05-05 ENCOUNTER — Encounter (HOSPITAL_COMMUNITY): Payer: PRIVATE HEALTH INSURANCE

## 2024-05-10 ENCOUNTER — Ambulatory Visit

## 2024-05-10 DIAGNOSIS — I442 Atrioventricular block, complete: Secondary | ICD-10-CM

## 2024-05-11 LAB — CUP PACEART REMOTE DEVICE CHECK
Battery Remaining Longevity: 52 mo
Battery Remaining Percentage: 88 %
Battery Voltage: 2.98 V
Brady Statistic AP VP Percent: 29 %
Brady Statistic AP VS Percent: 1 %
Brady Statistic AS VP Percent: 70 %
Brady Statistic AS VS Percent: 1 %
Brady Statistic RA Percent Paced: 26 %
Date Time Interrogation Session: 20260127020501
HighPow Impedance: 80 Ohm
Lead Channel Impedance Value: 380 Ohm
Lead Channel Impedance Value: 390 Ohm
Lead Channel Impedance Value: 480 Ohm
Lead Channel Pacing Threshold Amplitude: 0.5 V
Lead Channel Pacing Threshold Amplitude: 0.625 V
Lead Channel Pacing Threshold Amplitude: 3.25 V
Lead Channel Pacing Threshold Pulse Width: 0.5 ms
Lead Channel Pacing Threshold Pulse Width: 0.5 ms
Lead Channel Pacing Threshold Pulse Width: 0.8 ms
Lead Channel Sensing Intrinsic Amplitude: 12 mV
Lead Channel Sensing Intrinsic Amplitude: 5 mV
Lead Channel Setting Pacing Amplitude: 1.5 V
Lead Channel Setting Pacing Amplitude: 1.625
Lead Channel Setting Pacing Amplitude: 4.25 V
Lead Channel Setting Pacing Pulse Width: 0.5 ms
Lead Channel Setting Pacing Pulse Width: 0.8 ms
Lead Channel Setting Sensing Sensitivity: 0.5 mV
Pulse Gen Serial Number: 111082542
Zone Setting Status: 755011

## 2024-05-12 ENCOUNTER — Ambulatory Visit: Payer: PRIVATE HEALTH INSURANCE | Admitting: Cardiology

## 2024-05-12 ENCOUNTER — Encounter (HOSPITAL_COMMUNITY): Payer: PRIVATE HEALTH INSURANCE

## 2024-05-15 ENCOUNTER — Ambulatory Visit: Payer: Self-pay | Admitting: Cardiology

## 2024-05-18 ENCOUNTER — Encounter: Payer: Self-pay | Admitting: Cardiology

## 2024-05-18 ENCOUNTER — Ambulatory Visit: Payer: PRIVATE HEALTH INSURANCE | Admitting: Cardiology

## 2024-05-18 VITALS — BP 122/66 | HR 81 | Ht 64.0 in | Wt 172.8 lb

## 2024-05-18 DIAGNOSIS — I4819 Other persistent atrial fibrillation: Secondary | ICD-10-CM | POA: Diagnosis not present

## 2024-05-18 DIAGNOSIS — D6869 Other thrombophilia: Secondary | ICD-10-CM | POA: Diagnosis not present

## 2024-05-18 DIAGNOSIS — I428 Other cardiomyopathies: Secondary | ICD-10-CM | POA: Diagnosis not present

## 2024-05-18 DIAGNOSIS — I5022 Chronic systolic (congestive) heart failure: Secondary | ICD-10-CM

## 2024-05-18 DIAGNOSIS — Z95810 Presence of automatic (implantable) cardiac defibrillator: Secondary | ICD-10-CM | POA: Diagnosis not present

## 2024-05-18 LAB — CUP PACEART INCLINIC DEVICE CHECK
Battery Remaining Longevity: 45 mo
Brady Statistic RA Percent Paced: 28 %
Brady Statistic RV Percent Paced: 99.17 %
Date Time Interrogation Session: 20260204120509
HighPow Impedance: 86.625
Implantable Lead Connection Status: 753985
Implantable Lead Connection Status: 753985
Implantable Lead Connection Status: 753985
Implantable Lead Implant Date: 20160824
Implantable Lead Implant Date: 20160824
Implantable Lead Implant Date: 20250916
Implantable Lead Location: 753859
Implantable Lead Location: 753860
Implantable Lead Location: 753860
Implantable Pulse Generator Implant Date: 20250916
Lead Channel Impedance Value: 350 Ohm
Lead Channel Impedance Value: 387.5 Ohm
Lead Channel Impedance Value: 525 Ohm
Lead Channel Pacing Threshold Amplitude: 0.625 V
Lead Channel Pacing Threshold Amplitude: 0.625 V
Lead Channel Pacing Threshold Amplitude: 4 V
Lead Channel Pacing Threshold Amplitude: 4 V
Lead Channel Pacing Threshold Pulse Width: 0.5 ms
Lead Channel Pacing Threshold Pulse Width: 0.5 ms
Lead Channel Pacing Threshold Pulse Width: 0.8 ms
Lead Channel Pacing Threshold Pulse Width: 0.8 ms
Lead Channel Sensing Intrinsic Amplitude: 12 mV
Lead Channel Sensing Intrinsic Amplitude: 5 mV
Lead Channel Setting Pacing Amplitude: 1.625
Lead Channel Setting Pacing Amplitude: 1.625
Lead Channel Setting Pacing Amplitude: 4.5 V
Lead Channel Setting Pacing Pulse Width: 0.5 ms
Lead Channel Setting Pacing Pulse Width: 0.8 ms
Lead Channel Setting Sensing Sensitivity: 0.5 mV
Pulse Gen Serial Number: 111082542
Zone Setting Status: 755011

## 2024-05-18 NOTE — Patient Instructions (Signed)
 Medication Instructions:  Your physician recommends that you continue on your current medications as directed. Please refer to the Current Medication list given to you today.  *If you need a refill on your cardiac medications before your next appointment, please call your pharmacy*  Follow-Up: At [facility], you and your health needs are our priority.  As part of our continuing mission to provide you with exceptional heart care, our providers are all part of one team.  This team includes your primary Cardiologist (physician) and Advanced Practice Providers or APPs (Physician Assistants and Nurse Practitioners) who all work together to provide you with the care you need, when you need it.  Your next appointment:   3 months   Provider:   Ardeen Kohler, MD

## 2024-05-19 ENCOUNTER — Ambulatory Visit (HOSPITAL_COMMUNITY): Payer: PRIVATE HEALTH INSURANCE

## 2024-05-19 DIAGNOSIS — I5022 Chronic systolic (congestive) heart failure: Secondary | ICD-10-CM

## 2024-05-19 NOTE — Progress Notes (Signed)
 Remote ICD Transmission

## 2024-06-02 ENCOUNTER — Ambulatory Visit: Payer: PRIVATE HEALTH INSURANCE | Admitting: Pulmonary Disease

## 2024-07-26 ENCOUNTER — Ambulatory Visit

## 2024-08-01 ENCOUNTER — Ambulatory Visit (HOSPITAL_COMMUNITY): Payer: PRIVATE HEALTH INSURANCE | Admitting: Cardiology

## 2024-08-09 ENCOUNTER — Ambulatory Visit

## 2024-08-30 ENCOUNTER — Ambulatory Visit: Admitting: Cardiology

## 2024-10-25 ENCOUNTER — Ambulatory Visit

## 2024-11-08 ENCOUNTER — Ambulatory Visit

## 2025-01-24 ENCOUNTER — Ambulatory Visit

## 2025-02-07 ENCOUNTER — Ambulatory Visit
